# Patient Record
Sex: Male | Born: 1940 | Race: White | Marital: Married | Smoking: Former smoker
Health system: Southern US, Community
[De-identification: ages and names within clinical notes are randomized; demographics above are authoritative.]

## PROBLEM LIST (undated history)

## (undated) DIAGNOSIS — E785 Hyperlipidemia, unspecified: Secondary | ICD-10-CM

## (undated) DIAGNOSIS — I6529 Occlusion and stenosis of unspecified carotid artery: Secondary | ICD-10-CM

## (undated) DIAGNOSIS — R011 Cardiac murmur, unspecified: Secondary | ICD-10-CM

## (undated) DIAGNOSIS — R002 Palpitations: Secondary | ICD-10-CM

## (undated) DIAGNOSIS — E291 Testicular hypofunction: Secondary | ICD-10-CM

## (undated) DIAGNOSIS — E7800 Pure hypercholesterolemia, unspecified: Secondary | ICD-10-CM

## (undated) DIAGNOSIS — I700 Atherosclerosis of aorta: Secondary | ICD-10-CM

## (undated) DIAGNOSIS — R001 Bradycardia, unspecified: Secondary | ICD-10-CM

## (undated) DIAGNOSIS — I493 Ventricular premature depolarization: Secondary | ICD-10-CM

## (undated) DIAGNOSIS — K642 Third degree hemorrhoids: Secondary | ICD-10-CM

## (undated) DIAGNOSIS — K5792 Diverticulitis of intestine, part unspecified, without perforation or abscess without bleeding: Secondary | ICD-10-CM

## (undated) DIAGNOSIS — I341 Nonrheumatic mitral (valve) prolapse: Secondary | ICD-10-CM

## (undated) DIAGNOSIS — Z9289 Personal history of other medical treatment: Secondary | ICD-10-CM

## (undated) DIAGNOSIS — H269 Unspecified cataract: Secondary | ICD-10-CM

## (undated) DIAGNOSIS — E559 Vitamin D deficiency, unspecified: Secondary | ICD-10-CM

## (undated) DIAGNOSIS — E039 Hypothyroidism, unspecified: Secondary | ICD-10-CM

## (undated) DIAGNOSIS — I10 Essential (primary) hypertension: Secondary | ICD-10-CM

## (undated) DIAGNOSIS — N281 Cyst of kidney, acquired: Secondary | ICD-10-CM

## (undated) HISTORY — DX: Essential (primary) hypertension: I10

## (undated) HISTORY — DX: Pure hypercholesterolemia, unspecified: E78.00

## (undated) HISTORY — DX: Hyperlipidemia, unspecified: E78.5

## (undated) HISTORY — DX: Third degree hemorrhoids: K64.2

## (undated) HISTORY — DX: Hypothyroidism, unspecified: E03.9

## (undated) HISTORY — DX: Unspecified cataract: H26.9

## (undated) HISTORY — DX: Cyst of kidney, acquired: N28.1

## (undated) HISTORY — DX: Bradycardia, unspecified: R00.1

## (undated) HISTORY — DX: Ventricular premature depolarization: I49.3

## (undated) HISTORY — DX: Diverticulitis of intestine, part unspecified, without perforation or abscess without bleeding: K57.92

## (undated) HISTORY — DX: Nonrheumatic mitral (valve) prolapse: I34.1

## (undated) HISTORY — DX: Testicular hypofunction: E29.1

## (undated) HISTORY — DX: Cardiac murmur, unspecified: R01.1

## (undated) HISTORY — DX: Occlusion and stenosis of unspecified carotid artery: I65.29

## (undated) HISTORY — PX: COLONOSCOPY: SHX174

## (undated) HISTORY — DX: Atherosclerosis of aorta: I70.0

## (undated) HISTORY — DX: Palpitations: R00.2

## (undated) HISTORY — DX: Personal history of other medical treatment: Z92.89

## (undated) HISTORY — DX: Vitamin D deficiency, unspecified: E55.9

---

## 2012-11-27 ENCOUNTER — Telehealth (HOSPITAL_COMMUNITY): Payer: Self-pay | Admitting: Cardiovascular Disease

## 2012-12-05 ENCOUNTER — Other Ambulatory Visit (HOSPITAL_COMMUNITY): Payer: Self-pay | Admitting: Cardiovascular Disease

## 2012-12-05 DIAGNOSIS — I340 Nonrheumatic mitral (valve) insufficiency: Secondary | ICD-10-CM

## 2012-12-11 ENCOUNTER — Ambulatory Visit (HOSPITAL_COMMUNITY): Payer: 59

## 2012-12-12 ENCOUNTER — Ambulatory Visit (HOSPITAL_COMMUNITY)
Admission: RE | Admit: 2012-12-12 | Discharge: 2012-12-12 | Disposition: A | Discharge status: Home / Self Care | Payer: 59 | Source: Ambulatory Visit | Attending: Cardiovascular Disease | Admitting: Cardiovascular Disease

## 2012-12-12 DIAGNOSIS — I379 Nonrheumatic pulmonary valve disorder, unspecified: Secondary | ICD-10-CM | POA: Insufficient documentation

## 2012-12-12 DIAGNOSIS — I079 Rheumatic tricuspid valve disease, unspecified: Secondary | ICD-10-CM | POA: Insufficient documentation

## 2012-12-12 DIAGNOSIS — R002 Palpitations: Secondary | ICD-10-CM | POA: Insufficient documentation

## 2012-12-12 DIAGNOSIS — I340 Nonrheumatic mitral (valve) insufficiency: Secondary | ICD-10-CM

## 2012-12-12 DIAGNOSIS — I10 Essential (primary) hypertension: Secondary | ICD-10-CM | POA: Insufficient documentation

## 2012-12-12 DIAGNOSIS — I059 Rheumatic mitral valve disease, unspecified: Secondary | ICD-10-CM

## 2012-12-12 NOTE — Progress Notes (Signed)
[facility]   2D echo completed 12/12/2012.   Veda Canning, RDCS

## 2012-12-19 ENCOUNTER — Encounter: Payer: Self-pay | Admitting: *Deleted

## 2012-12-20 ENCOUNTER — Encounter: Payer: Self-pay | Admitting: Cardiovascular Disease

## 2012-12-23 ENCOUNTER — Ambulatory Visit (INDEPENDENT_AMBULATORY_CARE_PROVIDER_SITE_OTHER): Payer: 59 | Admitting: Cardiovascular Disease

## 2012-12-23 ENCOUNTER — Encounter: Payer: Self-pay | Admitting: Cardiovascular Disease

## 2012-12-23 VITALS — BP 130/80 | HR 58 | Ht 71.0 in | Wt 223.0 lb

## 2012-12-23 DIAGNOSIS — I059 Rheumatic mitral valve disease, unspecified: Secondary | ICD-10-CM

## 2012-12-23 DIAGNOSIS — I341 Nonrheumatic mitral (valve) prolapse: Secondary | ICD-10-CM | POA: Insufficient documentation

## 2012-12-23 DIAGNOSIS — I10 Essential (primary) hypertension: Secondary | ICD-10-CM

## 2012-12-23 DIAGNOSIS — E785 Hyperlipidemia, unspecified: Secondary | ICD-10-CM

## 2012-12-23 NOTE — Assessment & Plan Note (Signed)
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 He is asymptomatic. We'll continue to follow his 2-D echo on an annual basis.

## 2012-12-23 NOTE — Progress Notes (Signed)
12/23/2012 Chad Berry   31-May-1940  [id number]  Primary Physician Chad Real, MD Primary Cardiologist: Chad Gess MD Chad Berry   HPI:  The patient is a very pleasant, 72 year old, mildly overweight, married Caucasian male, father of 2, grandfather to 2 grandchildren whose son Chad Berry is also a patient of mine, as is his wife. I saw him 10 months ago. He was worked up for palpitations and had an echo that showed severe MR with mild mitral valve prolapse and mild LV size and function. His other problems include hypertension and hyperlipidemia. He lives an active lifestyle and is actually heading out to [place] to go coyote hunting for a week. He has changed his diet and has lost 15 pounds. He has cut out caffeine and alcohol. He has had 2 negative sleep studies. He is otherwise asymptomatic.  Since I saw him 6 months ago he denies chest pain or shortness of breath. Recent 2-D echo revealed normal LV size and function with posterior mitral valve leaflet prolapse and moderate MR. His primary care physician pauses profile.     Current Outpatient Prescriptions  Medication Sig Dispense Refill  . Ergocalciferol (VITAMIN D2 PO) once a week.      . losartan (COZAAR) 25 MG tablet Take 50 mg by mouth 2 (two) times daily.       . Magnesium 250 MG TABS Take 250 mg by mouth 2 (two) times daily.      . Pitavastatin Calcium (LIVALO) 2 MG TABS Take 1 tablet by mouth daily.      [name] testosterone cypionate (DEPOTESTOTERONE CYPIONATE) 200 MG/ML injection Inject 200 mg into the muscle every 28 (twenty-eight) days.        No current facility-administered medications for this visit.    No Known Allergies  History   Social History  . Marital Status: Married    Spouse Name: N/A    Number of Children: N/A  . Years of Education: N/A   Occupational History  . Not on file.   Social History Main Topics  . Smoking status: Former Smoker    Quit date: 12/23/1972  . Smokeless tobacco: Not on  file  . Alcohol Use: Not on file  . Drug Use: Not on file  . Sexual Activity: Not on file   Other Topics Concern  . Not on file   Social History Narrative  . No narrative on file     Review of Systems: General: negative for chills, fever, night sweats or weight changes.  Cardiovascular: negative for chest pain, dyspnea on exertion, edema, orthopnea, palpitations, paroxysmal nocturnal dyspnea or shortness of breath Dermatological: negative for rash Respiratory: negative for cough or wheezing Urologic: negative for hematuria Abdominal: negative for nausea, vomiting, diarrhea, bright red blood per rectum, melena, or hematemesis Neurologic: negative for visual changes, syncope, or dizziness All other systems reviewed and are otherwise negative except as noted above.    Blood pressure 130/80, pulse 58, height 5\' 11"  (1.803 m), weight 223 lb (101.152 kg).  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 He is asymptomatic. We'll continue to follow his 2-D echo on an annual basis.  Hyperlipidemia On statin therapy followed by his PCP  Essential hypertension Controlled on current medications      Chad Gess MD [facility], [facility] 12/23/2012 10:32 AM

## 2012-12-23 NOTE — Assessment & Plan Note (Signed)
On statin therapy followed by his PCP 

## 2012-12-23 NOTE — Assessment & Plan Note (Signed)
Controlled on current medications 

## 2012-12-23 NOTE — Patient Instructions (Signed)
  We will see you back in follow up in 6 months with an extender and 1 year with Dr Allyson Sabal   Dr Allyson Sabal has ordered an echocardiogram in 1 year

## 2013-09-02 ENCOUNTER — Telehealth: Payer: Self-pay | Admitting: Cardiovascular Disease

## 2013-09-02 NOTE — Telephone Encounter (Signed)
Closed encounter °

## 2013-09-11 ENCOUNTER — Telehealth: Payer: Self-pay | Admitting: Cardiology

## 2013-09-17 ENCOUNTER — Ambulatory Visit: Payer: 59 | Admitting: Cardiology

## 2013-09-18 NOTE — Telephone Encounter (Signed)
Closed encounter °

## 2013-09-25 ENCOUNTER — Ambulatory Visit (INDEPENDENT_AMBULATORY_CARE_PROVIDER_SITE_OTHER): Payer: 59 | Admitting: Cardiology

## 2013-09-25 ENCOUNTER — Encounter: Payer: Self-pay | Admitting: Cardiology

## 2013-09-25 VITALS — BP 140/70 | HR 61 | Ht 71.0 in | Wt 222.5 lb

## 2013-09-25 DIAGNOSIS — I10 Essential (primary) hypertension: Secondary | ICD-10-CM

## 2013-09-25 DIAGNOSIS — I341 Nonrheumatic mitral (valve) prolapse: Secondary | ICD-10-CM

## 2013-09-25 DIAGNOSIS — E785 Hyperlipidemia, unspecified: Secondary | ICD-10-CM

## 2013-09-25 DIAGNOSIS — I059 Rheumatic mitral valve disease, unspecified: Secondary | ICD-10-CM

## 2013-09-25 DIAGNOSIS — I6529 Occlusion and stenosis of unspecified carotid artery: Secondary | ICD-10-CM | POA: Insufficient documentation

## 2013-09-25 DIAGNOSIS — R002 Palpitations: Secondary | ICD-10-CM

## 2013-09-25 NOTE — Progress Notes (Addendum)
09/25/2013   PCP: Almedia BallsKELLY,SAM, MD   Chief Complaint  Patient presents with  . Follow-up    9 months. chest pain on occasion, denies dyspnea and swelling. had labs drawn at PCP earlier this month. has had trouble getting BP under controll, PCP d/c'd all meds to "start over" and he monitors BP each morning    Primary Cardiologist:Dr. Erlene QuanJ. Berry   HPI:  73 year old, mildly overweight, married Caucasian male, father of 2, grandfather to 2 grandchildren whose son Casimiro NeedleMichael is also a patient of Dr. Erlene QuanJ. Berry , as is his wife.  He was worked up for palpitations and had an echo that showed severe MR with mild mitral valve prolapse and mild LV size and function. His other problems include hypertension and hyperlipidemia. He lives an active lifestyle and is actually heading out to [place] to go coyote hunting for a week. He has changed his diet and has lost 15 pounds. He has cut out caffeine and alcohol. He has had 2 negative sleep studies. He is otherwise asymptomatic.    Today he is here for follow up.  He has new PCP and due to pt feeling cloudy in his head, "kind of like a hangover" his meds were adjusted.  BB caused bradycardia in the 40s.  Now he is feeling better overall on current meds.  No chest pain, no SOB.  No Known Allergies  Current Outpatient Prescriptions  Medication Sig Dispense Refill  . Ergocalciferol (VITAMIN D2 PO) once a week.      . hydrochlorothiazide (HYDRODIURIL) 25 MG tablet Take 12.5 mg by mouth daily.      [name]. losartan (COZAAR) 25 MG tablet Take 25 mg by mouth daily.       . Magnesium 250 MG TABS Take 250 mg by mouth 2 (two) times daily.      . Pitavastatin Calcium (LIVALO) 2 MG TABS Take 1 tablet by mouth daily.      [name]. testosterone (ANDROGEL) 50 MG/5GM (1%) GEL Place 5 g onto the skin daily.       No current facility-administered medications for this visit.    Past Medical History  Diagnosis Date  . Carotid stenosis     CAROTID DOPPLER, 12/07/2011 - Mild  arthrosclerotic changes, no high-grade stenosis  . MVP (mitral valve prolapse)     2D ECHO, 12/07/2011 - EF-65-70%, severe mitral regurgitation, moderate-severe tricuspid regurgitation, moderate pulmonary HTN, moderate pulmonic regurgitation  . Hypertension   . Hyperlipidemia   . Bradycardia     while on BB with HR to the 40s  . Rapid palpitations     event monitor with short bursts of SVT    History reviewed. No pertinent past surgical history.  WUJ:WJXBJYN:WGROS:General:no colds or fevers, no weight changes, was feeling cloudy for years until recent BP med adjustment. Skin:no rashes or ulcers HEENT:no blurred vision, no congestion CV:see HPI PUL:see HPI GI:no diarrhea constipation or melena, no indigestion GU:no hematuria, no dysuria MS:no joint pain, no claudication Neuro:no syncope, no lightheadedness Endo:no diabetes, no thyroid disease  PHYSICAL EXAM BP 140/70  Pulse 61  Ht 5\' 11"  (1.803 m)  Wt 222 lb 8 oz (100.925 kg)  BMI 31.05 kg/m2 General:Pleasant affect, NAD Skin:Warm and dry, brisk capillary refill HEENT:normocephalic, sclera clear, mucus membranes moist Neck:supple, no JVD, soft bil bruits  Heart:S1S2 RRR with 2-3/6 MR murmur, no  gallup, rub or click Lungs:clear without rales, rhonchi, or wheezes NFA:OZHYAbd:soft, non tender, + BS, do not  palpate liver spleen or masses Ext:no lower ext edema, 2+ pedal pulses, 2+ radial pulses Neuro:alert and oriented, MAE, follows commands, + facial symmetry  EKG:SR no acute changes from previous EKG  HR 61  ASSESSMENT AND PLAN Mitral valve prolapse Stable with last echo 11/2012 with  Moderate reg.  To repeat 11/2013 then follow up with Dr. Allyson Sabal  Hyperlipidemia Followed by PCP  Essential hypertension Controlled with recent med changes by PCP, BB caused HR to be in the 40s.  He had felt bad, cloudy mentally and with decrease in his antihypertensives he does feel better.    Rapid palpitations Still with occ burst of palpitations.

## 2013-09-25 NOTE — Assessment & Plan Note (Signed)
Stable with last echo 11/2012 with  Moderate reg.  To repeat 11/2013 then follow up with Dr. Allyson Sabal

## 2013-09-25 NOTE — Assessment & Plan Note (Signed)
Controlled with recent med changes by PCP, BB caused HR to be in the 40s.  He had felt bad, cloudy mentally and with decrease in his antihypertensives he does feel better.

## 2013-09-25 NOTE — Assessment & Plan Note (Signed)
Still with occ burst of palpitations.

## 2013-09-25 NOTE — Assessment & Plan Note (Signed)
Followed by PCP

## 2013-09-25 NOTE — Patient Instructions (Signed)
Have echo done before next visit with Dr. Allyson Sabal  In 6 months  Call if any problems.

## 2013-11-17 ENCOUNTER — Encounter (INDEPENDENT_AMBULATORY_CARE_PROVIDER_SITE_OTHER): Payer: Self-pay

## 2013-11-17 ENCOUNTER — Ambulatory Visit (INDEPENDENT_AMBULATORY_CARE_PROVIDER_SITE_OTHER): Payer: 59 | Admitting: Family Medicine

## 2013-11-17 VITALS — BP 126/65 | HR 56 | Temp 98.4°F | Ht 71.0 in | Wt 227.0 lb

## 2013-11-17 DIAGNOSIS — M545 Low back pain, unspecified: Secondary | ICD-10-CM

## 2013-11-17 MED ORDER — CYCLOBENZAPRINE HCL 10 MG PO TABS: 10.0000 mg | ORAL_TABLET | Freq: Three times a day (TID) | ORAL | Status: DC | PRN

## 2013-11-17 MED ORDER — NAPROXEN 500 MG PO TABS: 500.0000 mg | ORAL_TABLET | Freq: Two times a day (BID) | ORAL | Status: DC

## 2013-11-17 NOTE — Progress Notes (Signed)
   Subjective:    Patient ID: Chad SalisburyJohn Berry, male    DOB: [birth year], 73 y.o.   MRN: [id number]  HPI This 73 y.o. male presents for evaluation of back discomfort.  He does not remember injuring his back but is having some left lumbar pain and notices it when he stands up.   Review of Systems C/o back pain No chest pain, SOB, HA, dizziness, vision change, N/V, diarrhea, constipation, dysuria, urinary urgency or frequency or rash.     Objective:   Physical Exam  Vital signs noted  Well developed well nourished male.  HEENT - Head atraumatic Normocephalic                Eyes - PERRLA, Conjuctiva - clear Sclera- Clear EOMI                Ears - EAC's Wnl TM's Wnl Gross Hearing WNL                Throat - oropharanx wnl Respiratory - Lungs CTA bilateral Cardiac - RRR S1 and S2 without murmur GI - Abdomen soft Nontender and bowel sounds active x 4 MS - TTP left lumbar paraspinous muscles, decreased ROM LS spine    Assessment & Plan:  Left-sided low back pain without sciatica - Plan: naproxen (NAPROSYN) 500 MG tablet, cyclobenzaprine (FLEXERIL) 10 MG tablet Heating pad prn  Deatra CanterWilliam J Tehani Mersman FNP

## 2013-12-23 ENCOUNTER — Ambulatory Visit (HOSPITAL_COMMUNITY)
Admission: RE | Admit: 2013-12-23 | Discharge: 2013-12-23 | Disposition: A | Discharge status: Home / Self Care | Payer: 59 | Source: Ambulatory Visit | Attending: Cardiology | Admitting: Cardiology

## 2013-12-23 DIAGNOSIS — I341 Nonrheumatic mitral (valve) prolapse: Secondary | ICD-10-CM

## 2013-12-23 DIAGNOSIS — I059 Rheumatic mitral valve disease, unspecified: Secondary | ICD-10-CM | POA: Insufficient documentation

## 2013-12-23 NOTE — Progress Notes (Signed)
2D Echo Performed 12/23/2013    Mariea Mcmartin, RCS  

## 2013-12-29 ENCOUNTER — Telehealth: Payer: Self-pay | Admitting: *Deleted

## 2013-12-29 DIAGNOSIS — I341 Nonrheumatic mitral (valve) prolapse: Secondary | ICD-10-CM

## 2013-12-29 NOTE — Telephone Encounter (Signed)
Pt notified of echo results

## 2013-12-29 NOTE — Telephone Encounter (Signed)
Order placed for repeat echo in 1 year 

## 2013-12-29 NOTE — Telephone Encounter (Signed)
Message copied by Marella Bile on Mon Dec 29, 2013  4:42 PM ------      Message from: Runell Gess      Created: Fri Dec 26, 2013  5:24 PM       Nl LV fxn. Mild MVP and moderate MR. Will need to repeat annually ------

## 2013-12-29 NOTE — Telephone Encounter (Signed)
Message copied by Marella Bile on Mon Dec 29, 2013  3:40 PM ------      Message from: Runell Gess      Created: Fri Dec 26, 2013  5:24 PM       Nl LV fxn. Mild MVP and moderate MR. Will need to repeat annually ------

## 2014-03-05 DIAGNOSIS — M47817 Spondylosis without myelopathy or radiculopathy, lumbosacral region: Secondary | ICD-10-CM | POA: Insufficient documentation

## 2014-03-05 DIAGNOSIS — E230 Hypopituitarism: Secondary | ICD-10-CM | POA: Insufficient documentation

## 2014-08-03 DIAGNOSIS — M79672 Pain in left foot: Secondary | ICD-10-CM | POA: Insufficient documentation

## 2014-08-03 DIAGNOSIS — M79671 Pain in right foot: Secondary | ICD-10-CM | POA: Insufficient documentation

## 2015-01-11 ENCOUNTER — Telehealth: Payer: Self-pay | Admitting: Cardiovascular Disease

## 2015-01-11 NOTE — Progress Notes (Signed)
Cardiology Office Note   Date:  01/12/2015   ID:  Chad Berry, DOB January 24, 1941, MRN [id number]  PCP:  Almedia Balls, MD  Cardiologist:  Dr. Nanetta Batty   Electrophysiologist:  n/a  Chief Complaint  Patient presents with  . Palpitations     History of Present Illness: Chad Berry is a 74 y.o. male with a hx of MVP with mod MR, palpitations, HTN, HL.  He was last seen in clinic by Nada Boozer, NP in 5/15. He was taken off of beta-blocker at that time due to low HR and notable SEs.  He had a FU echo in 8/15 with normal LVF, mod MR and mild MVP involving the posterior leaflet.  He called in recently with palpitations.  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  He felt quite overheated afterward and tried to drink plenty of fluids.  He notes a long hx of palpitations. However, that evening he had increased frequency and increased intensity of his palpitations. He describes them as a missed heart beat followed by a hard heart beat.  The skipped beat is painful.  He denies syncope, near syncope or rapid palpitations.  He remains quite active.  He denies any exertional chest pain or significant DOE.  He denies orthopnea, PND, edema.  He notes difficulty sleeping over the past 2 nights.  He does feel better today and slept well last night.  He avoids caffeine and stimulants.  He has a long hx of fatigue.  He describes it as "just not feeling good."  This has gone on for years without change.  He tells me that his PCP has already done extensive workup.  Of note, he started HCTZ for his HTN ~ 6 mos ago.  He apparently had higher K+ levels with Cozaar 50 mg and he has remained on 25 mg QD since.     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 Carotid stenosis     CAROTID DOPPLER, 12/07/2011 - Mild arthrosclerotic changes, no high-grade stenosis  . MVP (mitral valve prolapse)     a. Echo 12/07/2011 - EF-65-70%, severe mitral regurgitation, moderate-severe tricuspid regurgitation, moderate pulmonary HTN, moderate pulmonic regurgitation;  b. 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 Hypertension   . Hyperlipidemia   . Bradycardia     while on BB with HR to the 40s  . Rapid palpitations     event monitor with short bursts of SVT    No past surgical history on file.   Current Outpatient Prescriptions  Medication Sig Dispense Refill  . losartan (COZAAR) 25 MG tablet Take 25 mg by mouth daily.     . Magnesium 250 MG TABS Take 250 mg by mouth 2 (two) times daily.    . Pitavastatin Calcium (LIVALO) 2 MG TABS Take 1 tablet by mouth daily.     No current facility-administered medications  for this visit.    Allergies:   Review of patient's allergies indicates no known allergies.    Social History:  The patient  reports that he quit smoking about 42 years ago. He does not have any smokeless tobacco history on file. He reports that he drinks alcohol. He reports that he does not use illicit drugs.   Family History:  The patient's family history includes Stroke (age of onset: 50) in his brother; Stroke (age of onset: 50) in his mother.    ROS:   Please see the history of present illness.   Review of Systems  Cardiovascular: Positive for chest pain and irregular heartbeat.  All other systems reviewed and are negative.     PHYSICAL EXAM: VS:  BP 126/74 mmHg  Pulse 56  Ht 5\' 11"  (1.803  m)  Wt 230 lb (104.327 kg)  BMI 32.09 kg/m2    Wt Readings from Last 3 Encounters:  01/12/15 230 lb (104.327 kg)  11/17/13 227 lb (102.967 kg)  09/25/13 222 lb 8 oz (100.925 kg)     GEN: Well nourished, well developed, in no acute distress HEENT: normal Neck: no JVD, no carotid bruits, no masses Cardiac:  Normal S1/S2, RRR; 2/6 holosystolic murmur  LLSB,  no rubs or gallops, no edema   Respiratory:  clear to auscultation bilaterally, no wheezing, rhonchi or rales. GI: soft, nontender, nondistended, + BS MS: no deformity or atrophy Skin: warm and dry  Neuro:  CNs II-XII intact, Strength and sensation are intact Psych: Normal affect   EKG:  EKG is ordered today.  It demonstrsinus bradycardia, HR 56, normal axis, nonspecific ST-T wave changes, QTc 416 ms    Recent Labs: No results found for requested labs within last 365 days.    Lipid Panel No results found for: CHOL, TRIG, HDL, CHOLHDL, VLDL, LDLCALC, LDLDIRECT    ASSESSMENT AND PLAN:  Palpitations:  I suspect he had an increase in PVCs/PACs secondary to dehydration in the setting of diuretic use.  He works outside most days for several hours.  He has had a lot of intolerances to BP medications in the past.  As he does work outside most of the time, I think it would be better for him to remain off of diuretic Rx to avoid dehydration.  -  DC HCTZ  -  BMET, Mg2+ today  - 48 Hr Holter to further assess palpitations  HTN:  BP well controlled.  I have asked him to monitor his BP off of HCTZ.  If BP remains < 140/90, will not make further changes.  If it starts to run 140/90 or higher, consider adding Amlodipine 2.5 mg QD.    MVP with Mod Mitral Regurgitation:  Will arrange FU echo.  He is currently not having any symptoms to suggest severe MR.  Hyperlipidemia:  Managed by PCP. Continue statin.    Medication Changes: Current medicines are reviewed at length with the patient today.  Concerns regarding medicines are as  outlined above.  The following changes have been made:   Discontinued Medications   CYCLOBENZAPRINE (FLEXERIL) 10 MG TABLET    Take 1 tablet (10 mg total) by mouth 3 (three) times daily as needed for muscle spasms.   ERGOCALCIFEROL (VITAMIN D2 PO)    once a week.   HYDROCHLOROTHIAZIDE (HYDRODIURIL) 25 MG TABLET    Take 12.5 mg by mouth daily.   NAPROXEN (NAPROSYN) 500 MG TABLET    Take 1 tablet (500 mg total) by mouth 2 (two)  times daily with a meal.   TESTOSTERONE (ANDROGEL) 50 MG/5GM (1%) GEL    Place 5 g onto the skin daily.   Modified Medications   No medications on file   New Prescriptions   No medications on file    Labs/ tests ordered today include:   Orders Placed This Encounter  Procedures  . Basic metabolic panel  . Magnesium  . Holter monitor - 48 hour  . EKG 12-Lead  . Echocardiogram     Disposition:    FU with Dr. Nanetta Batty 6-8 weeks.    Signed, Brynda Rim, MHS 01/12/2015 1:05 PM    [facility] [address] [place], [place], [place]  [address] Phone: [phone]; Fax: [phone]

## 2015-01-11 NOTE — Telephone Encounter (Signed)
Patient says he has had palpitations for years but this weekend they have been worse.  Today he got out of bed and he felt dizzy for the first time ever  Patient now eating breakfast, still feels light headed.  Patient will call me back with blood pressure and heart rate.  Told patient he has an OV at 1130am with Tereso Newcomer on [address]

## 2015-01-11 NOTE — Telephone Encounter (Signed)
BP today 149/79 P 68

## 2015-01-11 NOTE — Telephone Encounter (Signed)
Pt called in stating that over the weekend he has been having bad palpitations at night and when he woke up this morning he was very dizzy. He would like to come in to see someone as soon as possible. Please call  Thanks

## 2015-01-12 ENCOUNTER — Encounter: Payer: Self-pay | Admitting: Physician Assistant

## 2015-01-12 ENCOUNTER — Ambulatory Visit (INDEPENDENT_AMBULATORY_CARE_PROVIDER_SITE_OTHER): Payer: 59 | Admitting: Physician Assistant

## 2015-01-12 VITALS — BP 126/74 | HR 56 | Ht 71.0 in | Wt 230.0 lb

## 2015-01-12 DIAGNOSIS — I340 Nonrheumatic mitral (valve) insufficiency: Secondary | ICD-10-CM

## 2015-01-12 DIAGNOSIS — I10 Essential (primary) hypertension: Secondary | ICD-10-CM

## 2015-01-12 DIAGNOSIS — R002 Palpitations: Secondary | ICD-10-CM | POA: Diagnosis not present

## 2015-01-12 DIAGNOSIS — I341 Nonrheumatic mitral (valve) prolapse: Secondary | ICD-10-CM | POA: Diagnosis not present

## 2015-01-12 DIAGNOSIS — E785 Hyperlipidemia, unspecified: Secondary | ICD-10-CM

## 2015-01-12 LAB — BASIC METABOLIC PANEL
BUN: 16 mg/dL (ref 6–23)
CHLORIDE: 101 meq/L (ref 96–112)
CO2: 30 mEq/L (ref 19–32)
CREATININE: 1 mg/dL (ref 0.40–1.50)
Calcium: 9.2 mg/dL (ref 8.4–10.5)
GFR: 78.05 mL/min (ref >60.00)
Glucose, Bld: 95 mg/dL (ref 70–99)
POTASSIUM: 4.4 meq/L (ref 3.5–5.1)
Sodium: 137 mEq/L (ref 135–145)

## 2015-01-12 LAB — MAGNESIUM: Magnesium: 2.1 mg/dL (ref 1.5–2.5)

## 2015-01-12 NOTE — Patient Instructions (Signed)
Medication Instructions:  STOP HCTZ  Labwork: Bmet and Mag today  Testing/Procedures: Your physician has recommended that you wear a holter monitor. Holter monitors are medical devices that record the heart's electrical activity. Doctors most often use these monitors to diagnose arrhythmias. Arrhythmias are problems with the speed or rhythm of the heartbeat. The monitor is a small, portable device. You can wear one while you do your normal daily activities. This is usually used to diagnose what is causing palpitations/syncope (passing out).  Your physician has requested that you have an echocardiogram. Echocardiography is a painless test that uses sound waves to create images of your heart. It provides your doctor with information about the size and shape of your heart and how well your heart's chambers and valves are working. This procedure takes approximately one hour. There are no restrictions for this procedure.    Follow-Up: Your physician recommends that you schedule a follow-up appointment in: 6-8 weeks with Dr.Berry   Any Other Special Instructions Will Be Listed Below (If Applicable). Measure your blood pressure daily. Call the office if your BP is consistently 140/90 or greater

## 2015-01-14 ENCOUNTER — Telehealth: Payer: Self-pay | Admitting: *Deleted

## 2015-01-14 NOTE — Telephone Encounter (Signed)
Pt notified of lab reults by phone with verbal understanding.

## 2015-01-20 ENCOUNTER — Ambulatory Visit (INDEPENDENT_AMBULATORY_CARE_PROVIDER_SITE_OTHER): Payer: 59

## 2015-01-20 ENCOUNTER — Ambulatory Visit (HOSPITAL_COMMUNITY): Payer: 59 | Attending: Physician Assistant

## 2015-01-20 ENCOUNTER — Other Ambulatory Visit: Payer: Self-pay

## 2015-01-20 DIAGNOSIS — Z87891 Personal history of nicotine dependence: Secondary | ICD-10-CM | POA: Insufficient documentation

## 2015-01-20 DIAGNOSIS — E785 Hyperlipidemia, unspecified: Secondary | ICD-10-CM | POA: Diagnosis not present

## 2015-01-20 DIAGNOSIS — I341 Nonrheumatic mitral (valve) prolapse: Secondary | ICD-10-CM | POA: Insufficient documentation

## 2015-01-20 DIAGNOSIS — I10 Essential (primary) hypertension: Secondary | ICD-10-CM | POA: Insufficient documentation

## 2015-01-20 DIAGNOSIS — I071 Rheumatic tricuspid insufficiency: Secondary | ICD-10-CM | POA: Diagnosis not present

## 2015-01-20 DIAGNOSIS — R002 Palpitations: Secondary | ICD-10-CM | POA: Diagnosis not present

## 2015-01-20 DIAGNOSIS — I340 Nonrheumatic mitral (valve) insufficiency: Secondary | ICD-10-CM | POA: Diagnosis not present

## 2015-01-21 ENCOUNTER — Encounter: Payer: Self-pay | Admitting: Physician Assistant

## 2015-01-22 ENCOUNTER — Telehealth: Payer: Self-pay | Admitting: *Deleted

## 2015-01-22 NOTE — Telephone Encounter (Signed)
Lmptcb to go over echo results 

## 2015-01-25 NOTE — Telephone Encounter (Signed)
Lmptcb x 2 

## 2015-01-26 NOTE — Telephone Encounter (Signed)
Ptcb and has been notified of echo results and to keep his appt with Dr. Allyson Sabal 03/2015. Pt agreeable to plan of care.

## 2015-02-12 ENCOUNTER — Telehealth: Payer: Self-pay | Admitting: General Practice

## 2015-02-15 ENCOUNTER — Telehealth: Payer: Self-pay | Admitting: Cardiovascular Disease

## 2015-02-15 NOTE — Telephone Encounter (Signed)
Spoke to patient, notified of Holter monitor results and that these will be discussed further at follow up. Advised to call in interim if concerns. Pt voiced understanding.

## 2015-02-15 NOTE — Telephone Encounter (Signed)
Pt is returning Kimberly's call from last Thursday. He is not sure what it is in regards to . Please f/u with him  Thanks

## 2015-03-02 ENCOUNTER — Ambulatory Visit (INDEPENDENT_AMBULATORY_CARE_PROVIDER_SITE_OTHER): Payer: 59 | Admitting: Cardiovascular Disease

## 2015-03-02 ENCOUNTER — Encounter: Payer: Self-pay | Admitting: Cardiovascular Disease

## 2015-03-02 DIAGNOSIS — R0989 Other specified symptoms and signs involving the circulatory and respiratory systems: Secondary | ICD-10-CM

## 2015-03-02 DIAGNOSIS — R002 Palpitations: Secondary | ICD-10-CM

## 2015-03-02 DIAGNOSIS — I341 Nonrheumatic mitral (valve) prolapse: Secondary | ICD-10-CM | POA: Diagnosis not present

## 2015-03-02 DIAGNOSIS — E785 Hyperlipidemia, unspecified: Secondary | ICD-10-CM

## 2015-03-02 DIAGNOSIS — I10 Essential (primary) hypertension: Secondary | ICD-10-CM

## 2015-03-02 MED ORDER — LOSARTAN POTASSIUM 25 MG PO TABS: 25.0000 mg | ORAL_TABLET | Freq: Every day | ORAL | Status: DC

## 2015-03-02 MED ORDER — LOSARTAN POTASSIUM 50 MG PO TABS: 50.0000 mg | ORAL_TABLET | Freq: Every day | ORAL | Status: DC

## 2015-03-02 NOTE — Assessment & Plan Note (Signed)
History of hypertension blood pressure measures at 142/74. This is higher than previous readings and his hydrochlorothiazide has been discontinued. He is on losartan 25 mg which I'm going to increase to 50 mg a day.

## 2015-03-02 NOTE — Progress Notes (Signed)
03/02/2015 Chad Berry   03-Oct-1940  [id number]  Primary Physician Chad Balls, MD Primary Cardiologist: Chad Gess MD Chad Berry   HPI:  The patient is a very pleasant, 74 year old, mildly overweight, married Caucasian male, father of 2, grandfather to 2 grandchildren whose son Chad Berry is also a patient of mine, as is his wife. I saw him last 12/23/12. He was worked up for palpitations and had an echo that showed severe MR with mild mitral valve prolapse and mild LV size and function. His other problems include hypertension and hyperlipidemia. He lives an active lifestyle and is actually heading out to [place] to go coyote hunting for a week. He has changed his diet and has lost 15 pounds. He has cut out caffeine and alcohol. He has had 2 negative sleep studies. He is otherwise asymptomatic. Since I saw him several years ago he has seen Chad Berry in the office for evaluation of palpitations. A 2-D echo revealed normal LV function with holosystolic mitral valve prolapse, mild MR and moderate TR. A 48 hour Holter monitor showed PVCs, short runs of nonsustained ventricular tachycardia and short runs of SVT. Since stopping his diuretic his palpitations have improved.    Current Outpatient Prescriptions  Medication Sig Dispense Refill  . Magnesium 250 MG TABS Take 250 mg by mouth 2 (two) times daily.    . Pitavastatin Calcium (LIVALO) 2 MG TABS Take 1 tablet by mouth daily.     No current facility-administered medications for this visit.    No Known Allergies  Social History   Social History  . Marital Status: Married    Spouse Name: N/A  . Number of Children: N/A  . Years of Education: N/A   Occupational History  . Not on file.   Social History Main Topics  . Smoking status: Former Smoker    Quit date: 12/23/1972  . Smokeless tobacco: Not on file  . Alcohol Use: Yes  . Drug Use: No  . Sexual Activity: Not on file   Other Topics Concern  . Not on file    Social History Narrative     Review of Systems: General: negative for chills, fever, night sweats or weight changes.  Cardiovascular: negative for chest pain, dyspnea on exertion, edema, orthopnea, palpitations, paroxysmal nocturnal dyspnea or shortness of breath Dermatological: negative for rash Respiratory: negative for cough or wheezing Urologic: negative for hematuria Abdominal: negative for nausea, vomiting, diarrhea, bright red blood per rectum, melena, or hematemesis Neurologic: negative for visual changes, syncope, or dizziness All other systems reviewed and are otherwise negative except as noted above.    Blood pressure 142/74, pulse 64, height  (1.803 m), weight 230 lb (104.327 kg).  General appearance: alert and no distress Neck: no adenopathy, no JVD, supple, symmetrical, trachea midline, thyroid not enlarged, symmetric, no tenderness/mass/nodules and bilateral carotid bruits versus transmitted murmur Lungs: clear to auscultation bilaterally Heart: 2/6 outflow tract murmur as well as apical systolic murmur Extremities: extremities normal, atraumatic, no cyanosis or edema  EKG not performed today  ASSESSMENT AND PLAN:   Rapid palpitations History of palpitations which have improved since discontinuing his diuretic. He sent to the echo showed holosystolic mitral valve prolapse with mild MR and moderate TR. 848 hour Holter monitor showed PVCs with short bursts of nonsustained ventricular tachycardia and short bursts of SVT.  Mitral valve prolapse Recent 2-D echo performed 01/11/15 showed holosystolic mitral valve prolapse with mild MR.  Hyperlipidemia History of hyperlipidemia on Livalo followed by  his PCP  Essential hypertension History of hypertension blood pressure measures at 142/74. This is higher than previous readings and his hydrochlorothiazide has been discontinued. He is on losartan 25 mg which I'm going to increase to 50 mg a day.  Carotid stenosis.  bil 39% Bilateral carotid bruits with carotid Dopplers performed 3 years ago that showed minimal carotid artery stenosis. I am going to repeat these      Chad GessJonathan J. Jorel Gravlin MD [facility] LLCFACP,FACC,FAHA, [facility] DallasFSCAI 03/02/2015 10:36 AM

## 2015-03-02 NOTE — Assessment & Plan Note (Signed)
Recent 2-D echo performed 01/11/15 showed holosystolic mitral valve prolapse with mild MR.

## 2015-03-02 NOTE — Assessment & Plan Note (Signed)
History of hyperlipidemia on Livalo followed by his PCP 

## 2015-03-02 NOTE — Assessment & Plan Note (Signed)
History of palpitations which have improved since discontinuing his diuretic. He sent to the echo showed holosystolic mitral valve prolapse with mild MR and moderate TR. 848 hour Holter monitor showed PVCs with short bursts of nonsustained ventricular tachycardia and short bursts of SVT.

## 2015-03-02 NOTE — Patient Instructions (Addendum)
Medication Instructions:  Your physician recommends that you continue on your current medications as directed. Please refer to the Current Medication list given to you today.   Labwork: I will get your lab work from your Primary Care Physician.   Testing/Procedures: Your physician has requested that you have a carotid duplex. This test is an ultrasound of the carotid arteries in your neck. It looks at blood flow through these arteries that supply the brain with blood. Allow one hour for this exam. There are no restrictions or special instructions.    Follow-Up: Your physician wants you to follow-up in: 12 months with DR. Allyson SabalBerry. You will receive a reminder letter in the mail two months in advance. If you don't receive a letter, please call our office to schedule the follow-up appointment.   Any Other Special Instructions Will Be Listed Below (If Applicable).     If you need a refill on your cardiac medications before your next appointment, please call your pharmacy.

## 2015-03-02 NOTE — Assessment & Plan Note (Signed)
Bilateral carotid bruits with carotid Dopplers performed 3 years ago that showed minimal carotid artery stenosis. I am going to repeat these

## 2015-03-16 ENCOUNTER — Ambulatory Visit (HOSPITAL_COMMUNITY)
Admission: RE | Admit: 2015-03-16 | Discharge: 2015-03-16 | Disposition: A | Discharge status: Home / Self Care | Payer: 59 | Source: Ambulatory Visit | Attending: Cardiovascular Disease | Admitting: Cardiovascular Disease

## 2015-03-16 DIAGNOSIS — I6523 Occlusion and stenosis of bilateral carotid arteries: Secondary | ICD-10-CM | POA: Diagnosis not present

## 2015-03-16 DIAGNOSIS — I10 Essential (primary) hypertension: Secondary | ICD-10-CM | POA: Diagnosis not present

## 2015-03-16 DIAGNOSIS — E785 Hyperlipidemia, unspecified: Secondary | ICD-10-CM | POA: Diagnosis not present

## 2015-03-16 DIAGNOSIS — R0989 Other specified symptoms and signs involving the circulatory and respiratory systems: Secondary | ICD-10-CM | POA: Insufficient documentation

## 2015-06-09 ENCOUNTER — Other Ambulatory Visit: Payer: Self-pay | Admitting: Physician Assistant

## 2015-06-09 ENCOUNTER — Encounter (HOSPITAL_COMMUNITY): Payer: Self-pay | Admitting: Emergency Medicine

## 2015-06-09 ENCOUNTER — Emergency Department (HOSPITAL_COMMUNITY): Payer: 59

## 2015-06-09 ENCOUNTER — Emergency Department (HOSPITAL_COMMUNITY)
Admission: EM | Admit: 2015-06-09 | Discharge: 2015-06-09 | Disposition: A | Discharge status: Home / Self Care | Payer: 59 | Attending: Emergency Medicine | Admitting: Emergency Medicine

## 2015-06-09 DIAGNOSIS — I10 Essential (primary) hypertension: Secondary | ICD-10-CM

## 2015-06-09 DIAGNOSIS — E785 Hyperlipidemia, unspecified: Secondary | ICD-10-CM | POA: Diagnosis present

## 2015-06-09 DIAGNOSIS — I340 Nonrheumatic mitral (valve) insufficiency: Secondary | ICD-10-CM | POA: Diagnosis not present

## 2015-06-09 DIAGNOSIS — R002 Palpitations: Secondary | ICD-10-CM | POA: Diagnosis not present

## 2015-06-09 DIAGNOSIS — I6529 Occlusion and stenosis of unspecified carotid artery: Secondary | ICD-10-CM | POA: Diagnosis present

## 2015-06-09 DIAGNOSIS — I498 Other specified cardiac arrhythmias: Secondary | ICD-10-CM

## 2015-06-09 DIAGNOSIS — I341 Nonrheumatic mitral (valve) prolapse: Secondary | ICD-10-CM | POA: Diagnosis not present

## 2015-06-09 DIAGNOSIS — R011 Cardiac murmur, unspecified: Secondary | ICD-10-CM | POA: Insufficient documentation

## 2015-06-09 DIAGNOSIS — Z79899 Other long term (current) drug therapy: Secondary | ICD-10-CM | POA: Diagnosis not present

## 2015-06-09 DIAGNOSIS — R001 Bradycardia, unspecified: Secondary | ICD-10-CM | POA: Insufficient documentation

## 2015-06-09 DIAGNOSIS — I442 Atrioventricular block, complete: Secondary | ICD-10-CM

## 2015-06-09 DIAGNOSIS — Z87891 Personal history of nicotine dependence: Secondary | ICD-10-CM | POA: Diagnosis not present

## 2015-06-09 DIAGNOSIS — I071 Rheumatic tricuspid insufficiency: Secondary | ICD-10-CM

## 2015-06-09 LAB — I-STAT TROPONIN, ED
Troponin i, poc: 0 ng/mL (ref 0.00–0.08)
Troponin i, poc: 0 ng/mL (ref 0.00–0.08)

## 2015-06-09 LAB — BASIC METABOLIC PANEL
Anion gap: 11 (ref 5–15)
BUN: 11 mg/dL (ref 6–20)
CHLORIDE: 105 mmol/L (ref 101–111)
CO2: 24 mmol/L (ref 22–32)
CREATININE: 0.94 mg/dL (ref 0.61–1.24)
Calcium: 9.2 mg/dL (ref 8.9–10.3)
GFR calc Af Amer: >60 mL/min (ref >60)
GFR calc non Af Amer: >60 mL/min (ref >60)
Glucose, Bld: 112 mg/dL — ABNORMAL HIGH (ref 65–99)
Potassium: 4.2 mmol/L (ref 3.5–5.1)
SODIUM: 140 mmol/L (ref 135–145)

## 2015-06-09 LAB — CBC
HEMATOCRIT: 44.8 % (ref 39.0–52.0)
Hemoglobin: 15.5 g/dL (ref 13.0–17.0)
MCH: 32 pg (ref 26.0–34.0)
MCHC: 34.6 g/dL (ref 30.0–36.0)
MCV: 92.4 fL (ref 78.0–100.0)
Platelets: 149 10*3/uL — ABNORMAL LOW (ref 150–400)
RBC: 4.85 MIL/uL (ref 4.22–5.81)
RDW: 12.7 % (ref 11.5–15.5)
WBC: 4.9 10*3/uL (ref 4.0–10.5)

## 2015-06-09 MED ORDER — LOSARTAN POTASSIUM 100 MG PO TABS
100.0000 mg | ORAL_TABLET | Freq: Every day | ORAL | Status: DC
Start: 2015-06-09 — End: 2020-09-29

## 2015-06-09 NOTE — ED Provider Notes (Signed)
CSN: [id number]     Arrival date & time 06/09/15  1125 History   First MD Initiated Contact with Patient 06/09/15 1133     Chief Complaint  Patient presents with  . Palpitations     (Consider location/radiation/quality/duration/timing/severity/associated sxs/prior Treatment) HPI   Blood pressure 157/97, pulse 59, temperature 97.6 F (36.4 C), temperature source Oral, resp. rate 17, SpO2 97 %.  Daylin Gruszka is a 75 y.o. male with past medical history significant for mild carotid stenosis, hypertension, hyperlipidemia, mitral valve prolapse complaining of palpitations which she's had for many years and has had extensive workup for. States this is not typical for a palpitations and that they last longer, approximately 5 seconds and are associated with pain, at the very end of the palpitation and pain he feels slightly lightheaded but there has been no syncope. This started 5 days ago. States that the pain woke him from sleep approximately 5 times last night. He denies increasing peripheral edema, cough, fever, chills, abdominal pain, nausea, vomiting, diaphoresis, calf pain, recent immobilizations.   Past Medical History  Diagnosis Date  . Carotid stenosis     CAROTID DOPPLER, 12/07/2011 - Mild arthrosclerotic changes, no high-grade stenosis  . MVP (mitral valve prolapse)     a. Echo 12/07/2011 - EF-65-70%, severe mitral regurgitation, moderate-severe tricuspid regurgitation, moderate pulmonary HTN, moderate pulmonic regurgitation;  b. 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 Hypertension   . Hyperlipidemia   . Bradycardia     while on BB with HR to the 40s  . Rapid palpitations     event monitor with short bursts of SVT  . History of echocardiogram     Echo 9/16:  EF 60-65%, no RWMA, Gr 1 DD, holosystolic MVP of  posterior leaflet, mild MR, LA upper limits of normal, normal RVF, mod TR, PASP 50 mmHg   History reviewed. No pertinent past surgical history. Family History  Problem Relation Age of Onset  . Stroke Mother 27  . Stroke Brother 79   Social History  Substance Use Topics  . Smoking status: Former Smoker    Quit date: 12/23/1972  . Smokeless tobacco: None  . Alcohol Use: Yes    Review of Systems  10 systems reviewed and found to be negative, except as noted in the HPI.  Allergies  Review of patient's allergies indicates no known allergies.  Home Medications   Prior to Admission medications   Medication Sig Start Date End Date Taking? Authorizing Provider  losartan (COZAAR) 50 MG tablet Take 50 mg by mouth daily.   Yes Historical Provider, MD  Magnesium 250 MG TABS Take 250 mg by mouth 2 (two) times daily.   Yes Historical Provider, MD  Pitavastatin Calcium (LIVALO) 2 MG TABS Take 1 tablet by mouth daily.   Yes Historical Provider, MD   BP 145/77 mmHg  Pulse 50  Temp(Src) 97.6 F (36.4 C) (Oral)  Resp 16  SpO2 97% Physical Exam  Constitutional: He is oriented to person, place, and time. He appears well-developed and well-nourished. No distress.  HENT:  Head: Normocephalic and atraumatic.  Mouth/Throat: Oropharynx is clear and moist.  Eyes: Conjunctivae are normal. Pupils are equal, round, and reactive to light.  Neck: Normal range of motion. No JVD present. No tracheal deviation present.  Cardiovascular: Normal rate, regular rhythm and intact  distal pulses.   Murmur heard. Radial pulse equal bilaterally  Pulmonary/Chest: Effort normal and breath sounds normal. No stridor. No respiratory distress. He has no wheezes. He has no rales. He exhibits no tenderness.  Abdominal: Soft. He exhibits no distension and no mass. There is no tenderness. There is no rebound and no guarding.  Musculoskeletal: Normal range of motion. He exhibits no edema or tenderness.  No calf asymmetry,  superficial collaterals, palpable cords, edema, Homans sign negative bilaterally.    Neurological: He is alert and oriented to person, place, and time.  Skin: Skin is warm. He is not diaphoretic.  Psychiatric: He has a normal mood and affect.  Nursing note and vitals reviewed.   ED Course  Procedures (including critical care time) Labs Review Labs Reviewed  BASIC METABOLIC PANEL - Abnormal; Notable for the following:    Glucose, Bld 112 (*)    All other components within normal limits  CBC - Abnormal; Notable for the following:    Platelets 149 (*)    All other components within normal limits  I-STAT TROPOININ, ED    Imaging Review Dg Chest 2 View  06/09/2015  CLINICAL DATA:  Chest pain.  Mitral valve prolapse EXAM: CHEST  2 VIEW COMPARISON:  None. FINDINGS: There is no edema or consolidation. The heart size and pulmonary vascularity are normal. No adenopathy. No pneumothorax. No bone lesions. IMPRESSION: No edema or consolidation. Electronically Signed   By: Bretta Bang III M.D.   On: 06/09/2015 12:37   I have personally reviewed and evaluated these images and lab results as part of my medical decision-making.   EKG Interpretation   Date/Time:  Wednesday June 09 2015 11:26:18 EST Ventricular Rate:  58 PR Interval:  195 QRS Duration: 118 QT Interval:  449 QTC Calculation: 441 R Axis:   34 Text Interpretation:  Sinus rhythm Nonspecific intraventricular conduction  delay Confirmed by Lincoln Brigham [id number]) on 06/09/2015 12:42:21 PM      MDM   Final diagnoses:  Palpitation    Filed Vitals:   06/09/15 1126 06/09/15 1132 06/09/15 1307 06/09/15 1330  BP:  157/97 151/79 145/77  Pulse:  59 52 50  Temp:  97.6 F (36.4 C)    TempSrc:  Oral    Resp:  SpO2: 95% 97% 97% 97%    Vaiden Adames is 75 y.o. male presenting with palpitations which last longer than normal and also are associated with pain and lightheadedness, they last about 5 seconds. Chart review  shows this patient had a 48 hour Holter monitor which showed nonsustained runs of VT. Patient's EKG today with no arrhythmia or ischemic changes, blood work reassuring including troponin. Considering this patient's age, comorbidities and structural cardiac issues I would like cardiology to formally evaluate him.  This is a shared visit with the attending physician who personally evaluated the patient and agrees with the care plan.   Patient became symptomatic at 13:49 the rhythm showed shows PVCs, no V. tach.  Cardiology has evaluated the patient, discussed it with PA Lisabeth Devoid who will arrange for outpatient EPS and stress test. Will increase his dosage of losartan. Patient is cleared for discharge from a cardiology standpoint. I think this patient is stable for discharge to home.  This is a shared visit with the attending physician who personally evaluated the patient and agrees with the care plan.     Wynetta Emery, PA-C 06/09/15 1552  Tilden Fossa, MD 06/10/15 [phone]

## 2015-06-09 NOTE — ED Notes (Signed)
Pt reports worsening pain with the pause between palpitations since this past Sunday.  States the pain previously lasted only a second but now is lasting about 5 seconds.  Reports there has been concern that his potassium may become too high taking a double dose of losartan compared to what he was on previously.  Was taken off of hydrochlorothiazide due to probability, per Dr Allyson Sabal, cardiologist, that it was causing his palpitations.

## 2015-06-09 NOTE — ED Notes (Signed)
Pt arrives from [facility] via [facility]. Pt states he has had heart palpitations intermittently for years, but has begun having pain with palpitations today. Pt states he has recently been taken off HCTZ and has had his losartan doubled about 2 months. Pt received  of ASA PTA. Pt has a history of mitral valve prolapse and recently saw Dr. Allyson Sabal for a stress test and was dx with minor carotid stenosis per ems.

## 2015-06-09 NOTE — Consult Note (Signed)
CARDIOLOGY CONSULT NOTE   Patient ID: Chad Berry MRN: [id number], DOB/AGE: [birth year]   Admit date: 06/09/2015 Date of Consult: 06/09/2015   Primary Physician: Almedia Balls, MD Primary Cardiologist: Dr. Allyson Sabal  Pt. Profile  75 year old Caucasian male with past medical history of chronic palpitation, SVT, PAC/PVC, mild to moderate MR, moderate TR, mitral prolapse, HTN, HLD, and history of mild carotid stenosis presented with palpitation.  Problem List  Past Medical History  Diagnosis Date  . Carotid stenosis     CAROTID DOPPLER, 12/07/2011 - Mild arthrosclerotic changes, no high-grade stenosis  . MVP (mitral valve prolapse)     a. Echo 12/07/2011 - EF-65-70%, severe mitral regurgitation, moderate-severe tricuspid regurgitation, moderate pulmonary HTN, moderate pulmonic regurgitation;  b. 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 Hypertension   . Hyperlipidemia   . Bradycardia     while on BB with HR to the 40s  . Rapid palpitations     event monitor with short bursts of SVT  . History of echocardiogram     Echo 9/16:  EF 60-65%, no RWMA, Gr 1 DD, holosystolic MVP of posterior leaflet, mild MR, LA upper limits of normal, normal RVF, mod TR, PASP 50 mmHg    Past Surgical History  Procedure Laterality Date  . No past surgeries       Allergies  No Known Allergies  HPI   The patient is a 75 year old Caucasian male with past medical history of chronic palpitation, SVT, PAC/PVC, mild to moderate MR, moderate TR, mitral prolapse, HTN, HLD, and history of mild carotid stenosis. His last echocardiogram obtained on 01/20/2015 showed EF 60-65%, grade 1 diastolic dysfunction, holosystolic prolapse of posterior leaflet, mild MR, moderate TR. 48-hour Holter monitor obtained on the same day showed PAC/PVC and one  episode of SVT.  According to the patient, he has chronic history of palpitation however the episode usually last only one second at the time. His hydrochlorothiazide was recently discontinued, his losartan was increased from 25 mg to 50 mg. Since Sunday, he has been having significant increase in palpitation. The duration of the palpitation also lasted longer than usual up to 5 seconds. He also described a pulsatile chest pain related to the palpitation at the same time. Last night along, it woke him up 4 times. He eventually decided to seek medical attention at [facility] on 2/80/2017. Despite taking him off HCTZ, potassium normal at 4.2. CXR normal. Troponin negative. Chest x-ray no acute process. EKG showed normal sinus rhythm without significant ST-T wave changes. Cardiology consulted for palpitation, while cardiology was in the room, he went into idioventricular rhythm with what appears to be retrograde P waves with heart rate of 70s. He started having the symptom at the same time. The idioventricular rhythm only lasted less than 10 seconds, and he was fully aware when it ended.    No current facility-administered medications on file prior to encounter.   Current Outpatient Prescriptions on File Prior to Encounter  Medication Sig Dispense Refill  . Magnesium 250 MG TABS Take 250 mg by mouth 2 (two) times daily.    . Pitavastatin Calcium (LIVALO) 2 MG TABS Take 1 tablet by mouth daily.       Family History Family History  Problem Relation Age of Onset  . Stroke Mother 75  . Stroke Brother 55  .  Heart failure Father      Social History Social History   Social History  . Marital Status: Married    Spouse Name: N/A  . Number of Children: N/A  . Years of Education: N/A   Occupational History  . Not on file.   Social History Main Topics  . Smoking status: Former Smoker    Quit date: 12/23/1972  . Smokeless tobacco: Not on file  . Alcohol Use: 0.0 oz/week    0 Standard  drinks or equivalent per week     Comment: occasionally  . Drug Use: No  . Sexual Activity: Not on file   Other Topics Concern  . Not on file   Social History Narrative     Review of Systems  General:  No chills, fever, night sweats or weight changes.  Cardiovascular:  No chest pain, dyspnea on exertion, edema, orthopnea, paroxysmal nocturnal dyspnea. +palpitations, pulsatile chest pain Dermatological: No rash, lesions/masses Respiratory: No cough, dyspnea Urologic: No hematuria, dysuria Abdominal:   No nausea, vomiting, diarrhea, bright red blood per rectum, melena, or hematemesis Neurologic:  No visual changes, wkns, changes in mental status. +mild dizziness, no presyncope or syncope All other systems reviewed and are otherwise negative except as noted above.  Physical Exam  Blood pressure 138/78, pulse 54, temperature 97.6 F (36.4 C), temperature source Oral, resp. rate 14, SpO2 96 %.  General: Pleasant, NAD Psych: Normal affect. Neuro: Alert and oriented X 3. Moves all extremities spontaneously. HEENT: Normal  Neck: Supple without bruits or JVD. Lungs:  Resp regular and unlabored, CTA. Heart: RRR no s3, s4, or murmurs. Abdomen: Soft, non-tender, non-distended, BS + x 4.  Extremities: No clubbing, cyanosis or edema. DP/PT/Radials 2+ and equal bilaterally.  Labs  No results for input(s): CKTOTAL, CKMB, TROPONINI in the last 72 hours. Lab Results  Component Value Date   WBC 4.9 06/09/2015   HGB 15.5 06/09/2015   HCT 44.8 06/09/2015   MCV 92.4 06/09/2015   PLT 149* 06/09/2015     Recent Labs Lab 06/09/15 1152  NA 140  K 4.2  CL 105  CO2 24  BUN 11  CREATININE 0.94  CALCIUM 9.2  GLUCOSE 112*    Radiology/Studies  Dg Chest 2 View  06/09/2015  CLINICAL DATA:  Chest pain.  Mitral valve prolapse EXAM: CHEST  2 VIEW COMPARISON:  None. FINDINGS: There is no edema or consolidation. The heart size and pulmonary vascularity are normal. No adenopathy. No  pneumothorax. No bone lesions. IMPRESSION: No edema or consolidation. Electronically Signed   By: Bretta Bang III M.D.   On: 06/09/2015 12:37    EKG  normal sinus rhythm without significant ST-T wave changes  ASSESSMENT AND PLAN  1. 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 [place], likely related to underlying mitral prolapse. Will arrange outpatient treadmill stress test and EP eval  - patient is cleared for discharge from cardiac perspective   2. mild to moderate MR/mitral prolapse 3. moderate TR 4. HTN: uncontrolled with SBP 150s in the ED, per Dr. [place], will increase losartan to 100 daily 5. HLD 6. history of mild carotid stenosis: mild on U/S 03/2015   SignedAzalee Course, PA-C 06/09/2015, 3:46 PM  Patient seen and examined and history reviewed. Agree with above findings and plan. Chad Berry is a very pleasant Chad Berry with long history of MV prolapse and MR who presents to the  ED today for evaluation of palpitations. He has a history of palpitations. Holter monitor this past fall showed multiple arrhythmias with PACs, PVCs, short bursts of PAT and NSVT. HR mean was 56. Did fairly well until this past Sunday when he began experiencing a different type of palpitation that was worse than before. This would wake him at night. He felt some lightheadedness and chest discomfort with this. No syncope. Very anxious about the way this makes him feel. On exam BP is elevated. During exam his symptoms occurred and he was noted to have a junctional rhythm with retrograde Ps and rate 60-70. This was not sustained. No JVD. Lungs are clear. CV shows a Gr 2-3/6 harsh late systolic murmur at the apex.  No edema.  Ecg is normal. Blood tests are normal.  Impression: Palpitations with multiple arrhythmias. Symptoms today correlate with a junctional rhythm. Known history of PACs, PVCs, NSVT, and PAT. Treatment  options limited due to bradycardia. I suspect these are related to his chronic MV prolapse. He does not use caffeine or Etoh. No decongestants. At this point I have recommended increasing his losartan to 100 mg daily for better BP control. Will arrange for outpatient stress testing to rule out ischemic trigger. I have reassured him that I do not think his arrhythmia is not dangerous but acknowledge that the symptoms can be upsetting. I think it would be a good idea for him to see EP as an outpatient. It does not appear that his MV prolapse is severe enough to need repair at this time.   Blanton Kardell [place], MDFACC 06/09/2015 3:59 PM

## 2015-06-09 NOTE — Discharge Instructions (Signed)
Please follow with your primary care doctor in the next 2 days for a check-up. They must obtain records for further management.   Do not hesitate to return to the Emergency Department for any new, worsening or concerning symptoms.    Palpitations A palpitation is the feeling that your heartbeat is irregular or is faster than normal. It may feel like your heart is fluttering or skipping a beat. Palpitations are usually not a serious problem. However, in some cases, you may need further medical evaluation. CAUSES  Palpitations can be caused by:  Smoking.  Caffeine or other stimulants, such as diet pills or energy drinks.  Alcohol.  Stress and anxiety.  Strenuous physical activity.  Fatigue.  Certain medicines.  Heart disease, especially if you have a history of irregular heart rhythms (arrhythmias), such as atrial fibrillation, atrial flutter, or supraventricular tachycardia.  An improperly working pacemaker or defibrillator. DIAGNOSIS  To find the cause of your palpitations, your health care provider will take your medical history and perform a physical exam. Your health care provider may also have you take a test called an ambulatory electrocardiogram (ECG). An ECG records your heartbeat patterns over a 24-hour period. You may also have other tests, such as:  Transthoracic echocardiogram (TTE). During echocardiography, sound waves are used to evaluate how blood flows through your heart.  Transesophageal echocardiogram (TEE).  Cardiac monitoring. This allows your health care provider to monitor your heart rate and rhythm in real time.  Holter monitor. This is a portable device that records your heartbeat and can help diagnose heart arrhythmias. It allows your health care provider to track your heart activity for several days, if needed.  Stress tests by exercise or by giving medicine that makes the heart beat faster. TREATMENT  Treatment of palpitations depends on the cause of  your symptoms and can vary greatly. Most cases of palpitations do not require any treatment other than time, relaxation, and monitoring your symptoms. Other causes, such as atrial fibrillation, atrial flutter, or supraventricular tachycardia, usually require further treatment. HOME CARE INSTRUCTIONS   Avoid:  Caffeinated coffee, tea, soft drinks, diet pills, and energy drinks.  Chocolate.  Alcohol.  Stop smoking if you smoke.  Reduce your stress and anxiety. Things that can help you relax include:  A method of controlling things in your body, such as your heartbeats, with your mind (biofeedback).  Yoga.  Meditation.  Physical activity such as swimming, jogging, or walking.  Get plenty of rest and sleep. SEEK MEDICAL CARE IF:   You continue to have a fast or irregular heartbeat beyond 24 hours.  Your palpitations occur more often. SEEK IMMEDIATE MEDICAL CARE IF:  You have chest pain or shortness of breath.  You have a severe headache.  You feel dizzy or you faint. MAKE SURE YOU:  Understand these instructions.  Will watch your condition.  Will get help right away if you are not doing well or get worse.   This information is not intended to replace advice given to you by your health care provider. Make sure you discuss any questions you have with your health care provider.   Document Released: 04/14/2000 Document Revised: 04/22/2013 Document Reviewed: 06/16/2011 Elsevier Interactive Patient Education [employer]

## 2015-06-16 ENCOUNTER — Telehealth (HOSPITAL_COMMUNITY): Payer: Self-pay | Admitting: *Deleted

## 2015-06-16 NOTE — Telephone Encounter (Signed)
Patient given detailed instructions per Myocardial Perfusion Study Information Sheet for the test on 06/18/15 at 0745. Patient notified to arrive 15 minutes early and that it is imperative to arrive on time for appointment to keep from having the test rescheduled.  If you need to cancel or reschedule your appointment, please call the office within 24 hours of your appointment. Failure to do so may result in a cancellation of your appointment, and a $50 no show fee. Patient verbalized understanding.Chad Berry, Adelene Idler

## 2015-06-18 ENCOUNTER — Ambulatory Visit (HOSPITAL_COMMUNITY): Payer: 59 | Attending: Cardiology

## 2015-06-18 DIAGNOSIS — I442 Atrioventricular block, complete: Secondary | ICD-10-CM | POA: Diagnosis not present

## 2015-06-18 DIAGNOSIS — R002 Palpitations: Secondary | ICD-10-CM | POA: Diagnosis not present

## 2015-06-18 DIAGNOSIS — I779 Disorder of arteries and arterioles, unspecified: Secondary | ICD-10-CM | POA: Insufficient documentation

## 2015-06-18 DIAGNOSIS — I10 Essential (primary) hypertension: Secondary | ICD-10-CM | POA: Diagnosis not present

## 2015-06-18 LAB — MYOCARDIAL PERFUSION IMAGING
CHL CUP NUCLEAR SDS: 1
CHL CUP NUCLEAR SSS: 5
CSEPED: 8 min
CSEPPHR: 139 {beats}/min
Estimated workload: 10.1 METS
Exercise duration (sec): 0 s
LHR: 0.31
LV dias vol: 134 mL
LVSYSVOL: 53 mL
MPHR: 148 {beats}/min
Percent HR: 93 %
Rest HR: 54 {beats}/min
SRS: 4
TID: 0.96

## 2015-06-18 MED ORDER — TECHNETIUM TC 99M SESTAMIBI GENERIC - CARDIOLITE
31.6000 | Freq: Once | INTRAVENOUS | Status: AC | PRN
  Administered 2015-06-18: 32 via INTRAVENOUS

## 2015-06-18 MED ORDER — TECHNETIUM TC 99M SESTAMIBI GENERIC - CARDIOLITE
10.6000 | Freq: Once | INTRAVENOUS | Status: AC | PRN
  Administered 2015-06-18: 11 via INTRAVENOUS

## 2015-06-23 ENCOUNTER — Encounter: Payer: Self-pay | Admitting: Internal Medicine

## 2015-06-23 ENCOUNTER — Ambulatory Visit (INDEPENDENT_AMBULATORY_CARE_PROVIDER_SITE_OTHER): Payer: 59 | Admitting: Internal Medicine

## 2015-06-23 VITALS — BP 140/82 | HR 58 | Ht 71.0 in | Wt 228.2 lb

## 2015-06-23 DIAGNOSIS — R002 Palpitations: Secondary | ICD-10-CM

## 2015-06-23 NOTE — Patient Instructions (Signed)
Medication Instructions:  Your physician recommends that you continue on your current medications as directed. Please refer to the Current Medication list given to you today.   Labwork: NONE  Testing/Procedures: NONE  Follow-Up: Your physician recommends that you schedule a follow-up appointment in: AS NEEDED      Any Other Special Instructions Will Be Listed Below (If Applicable).     If you need a refill on your cardiac medications before your next appointment, please call your pharmacy.   

## 2015-06-23 NOTE — Progress Notes (Signed)
HPI Mr. Chad Berry is referred today for evaluation of palpitations by Dr. Allyson Sabal. He is a pleasant 75 yo man with HTN, carotid stenosis, and dyslipidemia who was seen several weeks ago and had a cardiac monitor and is referred for additional evaluation. Prior to the episode, he admits to drinking too much caffeine and this (the palpitations) has improved since he stopped the caffeine.   No Known Allergies   Current Outpatient Prescriptions  Medication Sig Dispense Refill  . losartan (COZAAR) 100 MG tablet Take 1 tablet (100 mg total) by mouth daily. 90 tablet 3  . Magnesium 250 MG TABS Take 250 mg by mouth 2 (two) times daily.    . Pitavastatin Calcium (LIVALO) 2 MG TABS Take 1 tablet by mouth daily.     No current facility-administered medications for this visit.     Past Medical History  Diagnosis Date  . Carotid stenosis     CAROTID DOPPLER, 12/07/2011 - Mild arthrosclerotic changes, no high-grade stenosis  . MVP (mitral valve prolapse)     a. Echo 12/07/2011 - EF-65-70%, severe mitral regurgitation, moderate-severe tricuspid regurgitation, moderate pulmonary HTN, moderate pulmonic regurgitation;  b. 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 Hypertension   . Hyperlipidemia   . Bradycardia     while on BB with HR to the 40s  . Rapid palpitations     event monitor with short bursts of SVT  . History of echocardiogram     Echo 9/16:  EF 60-65%, no RWMA, Gr 1 DD, holosystolic MVP of posterior leaflet, mild MR, LA upper limits of normal, normal RVF, mod TR, PASP 50 mmHg    ROS:   All systems reviewed and negative except as noted in the HPI.   Past Surgical History  Procedure Laterality Date  . No past surgeries       Family History  Problem Relation Age of Onset  . Stroke Mother 35  . Stroke  Brother 59  . Heart failure Father      Social History   Social History  . Marital Status: Married    Spouse Name: N/A  . Number of Children: N/A  . Years of Education: N/A   Occupational History  . Not on file.   Social History Main Topics  . Smoking status: Former Smoker    Quit date: 12/23/1972  . Smokeless tobacco: Not on file  . Alcohol Use: 0.0 oz/week    0 Standard drinks or equivalent per week     Comment: occasionally  . Drug Use: No  . Sexual Activity: Not on file   Other Topics Concern  . Not on file   Social History Narrative     BP 140/82 mmHg  Pulse 58  Ht  (1.803 m)  Wt 228 lb 3.2 oz (103.511 kg)  BMI 31.84 kg/m2  SpO2 96%  Physical Exam:  Well appearing 75 yo man, NAD HEENT: Unremarkable Neck:  6 cm JVD, no thyromegally Lymphatics:  No adenopathy Back:  No CVA tenderness Lungs:  Clear with no wheezes HEART:  Regular rate rhythm, no murmurs, no rubs, no clicks Abd:  soft, positive bowel sounds, no organomegally, no rebound, no guarding Ext:  2 plus pulses, no edema, no cyanosis, no clubbing Skin:  No rashes no nodules Neuro:  CN II  through XII intact, motor grossly intact  EKG - reviewed - nsr  Assess/Plan: 1. Palpitations: his symptoms are much improved off of caffeine. He has had PVC's and NS atrial tachy on his 48 hour monitor. I have recommended that he undergo watchful waiting, avoid caffeine and ETOH, and a lack of sleep. We discussed anti-arrhythmic medications and beta blockers but his symptoms do not warrant additional medical therapy. 2. HTN - his blood pressure is controlled reasonably well. At home it is better.  3. Carotis stenosis - he is stable. followup with Dr. Dorma Russell.  Gregg Taylor,M.D. Leonia Reeves.D.

## 2015-09-21 DIAGNOSIS — K5792 Diverticulitis of intestine, part unspecified, without perforation or abscess without bleeding: Secondary | ICD-10-CM | POA: Insufficient documentation

## 2016-11-30 ENCOUNTER — Other Ambulatory Visit: Payer: Self-pay | Admitting: Family Medicine

## 2016-11-30 ENCOUNTER — Ambulatory Visit
Admission: RE | Admit: 2016-11-30 | Discharge: 2016-11-30 | Disposition: A | Discharge status: Home / Self Care | Payer: 59 | Source: Ambulatory Visit | Attending: Family Medicine | Admitting: Family Medicine

## 2016-11-30 DIAGNOSIS — R0789 Other chest pain: Secondary | ICD-10-CM

## 2017-01-09 ENCOUNTER — Ambulatory Visit
Admission: RE | Admit: 2017-01-09 | Discharge: 2017-01-09 | Disposition: A | Discharge status: Home / Self Care | Payer: 59 | Source: Ambulatory Visit | Attending: Family Medicine | Admitting: Family Medicine

## 2017-01-09 ENCOUNTER — Other Ambulatory Visit: Payer: Self-pay | Admitting: Family Medicine

## 2017-01-09 DIAGNOSIS — R109 Unspecified abdominal pain: Secondary | ICD-10-CM

## 2017-01-09 MED ORDER — IOPAMIDOL (ISOVUE-300) INJECTION 61%
100.0000 mL | Freq: Once | INTRAVENOUS | Status: AC | PRN
  Administered 2017-01-09: 100 mL via INTRAVENOUS

## 2017-01-22 ENCOUNTER — Telehealth: Payer: Self-pay

## 2017-01-22 NOTE — Telephone Encounter (Signed)
Records sent from Dr [facility] office of patients past colonoscopy report from [facility], Dr Donnajean Lopes and also his CT scan done 01/09/17 at [facility], which showed abnormal sigmoid colon.  Per Dr Leone Payor he needs a flex sig which Dr Leone Payor can do in October.  He is out of the office this week so I've let Dr Geoffery Lyons office know what he needs and I'll see what 7:30AM spots are available when Dr Leone Payor returns and I'll call Dr Geoffery Lyons office back.

## 2017-02-02 ENCOUNTER — Encounter: Payer: Self-pay | Admitting: Internal Medicine

## 2017-02-02 NOTE — Telephone Encounter (Signed)
Patient has been set up for a pre-visit on 02/07/17 at 1:30pm and to have his flex sig on 02/14/17 at 9:00AM with Dr Leone Payor.  New patient information will be mailed out to patient. Dr [employer] office has been informed of these dates.

## 2017-02-07 ENCOUNTER — Telehealth: Payer: Self-pay | Admitting: *Deleted

## 2017-02-07 NOTE — Telephone Encounter (Signed)
Patient no show pv today. Called patient, no answer, left message for pt to call us back before 5 pm to reschedule this appointment.

## 2017-02-08 ENCOUNTER — Ambulatory Visit (AMBULATORY_SURGERY_CENTER): Payer: Self-pay | Admitting: *Deleted

## 2017-02-08 VITALS — Ht 70.5 in | Wt 228.4 lb

## 2017-02-08 DIAGNOSIS — Z1211 Encounter for screening for malignant neoplasm of colon: Secondary | ICD-10-CM

## 2017-02-08 NOTE — Progress Notes (Signed)
No egg or soy allergy known to patient  No issues with past sedation with any surgeries  or procedures, no intubation problems  No diet pills per patient No home 02 use per patient  No blood thinners per patient  Pt denies issues with constipation pt. States he had it yesterday for the first time.   No A fib or A flutter  EMMI video sent to pt's e mail

## 2017-02-14 ENCOUNTER — Ambulatory Visit (AMBULATORY_SURGERY_CENTER): Payer: 59 | Admitting: Internal Medicine

## 2017-02-14 ENCOUNTER — Encounter: Payer: Self-pay | Admitting: Internal Medicine

## 2017-02-14 VITALS — BP 126/63 | HR 50 | Temp 97.3°F | Resp 18 | Ht 71.0 in | Wt 228.0 lb

## 2017-02-14 DIAGNOSIS — K642 Third degree hemorrhoids: Secondary | ICD-10-CM | POA: Diagnosis not present

## 2017-02-14 DIAGNOSIS — R933 Abnormal findings on diagnostic imaging of other parts of digestive tract: Secondary | ICD-10-CM | POA: Diagnosis not present

## 2017-02-14 DIAGNOSIS — K5730 Diverticulosis of large intestine without perforation or abscess without bleeding: Secondary | ICD-10-CM | POA: Diagnosis not present

## 2017-02-14 HISTORY — PX: SIGMOIDOSCOPY: SUR1295

## 2017-02-14 HISTORY — DX: Third degree hemorrhoids: K64.2

## 2017-02-14 MED ORDER — SODIUM CHLORIDE 0.9 % IV SOLN: 500.0000 mL | INTRAVENOUS | Status: DC

## 2017-02-14 NOTE — Patient Instructions (Addendum)
   You have severe diverticulosis and thickening of the folds which is what the radiologist saw. No tumors.  You do have significant hemorrhoids and I think I can fix them and improve your quality of life.  I appreciate the opportunity to care for you. Iva Booparl E. Gessner, MD, FACG  YOU HAD AN ENDOSCOPIC PROCEDURE TODAY AT THE [facility]:   Refer to the procedure report that was given to you for any specific questions about what was found during the examination.  If the procedure report does not answer your questions, please call your gastroenterologist to clarify.  If you requested that your care partner not be given the details of your procedure findings, then the procedure report has been included in a sealed envelope for you to review at your convenience later.  YOU SHOULD EXPECT: Some feelings of bloating in the abdomen. Passage of more gas than usual.  Walking can help get rid of the air that was put into your GI tract during the procedure and reduce the bloating. If you had a lower endoscopy (such as a colonoscopy or flexible sigmoidoscopy) you may notice spotting of blood in your stool or on the toilet paper. If you underwent a bowel prep for your procedure, you may not have a normal bowel movement for a few days.  Please Note:  You might notice some irritation and congestion in your nose or some drainage.  This is from the oxygen used during your procedure.  There is no need for concern and it should clear up in a day or so.  SYMPTOMS TO REPORT IMMEDIATELY:   Following lower endoscopy (colonoscopy or flexible sigmoidoscopy):  Excessive amounts of blood in the stool  Significant tenderness or worsening of abdominal pains  Swelling of the abdomen that is new, acute  Fever of 100F or higher  For urgent or emergent issues, a gastroenterologist can be reached at any hour by calling [phone].   DIET:  We do recommend a small meal at first, but then you may proceed  to your regular diet.  Drink plenty of fluids but you should avoid alcoholic beverages for 24 hours.  ACTIVITY:  You should plan to take it easy for the rest of today and you should NOT DRIVE or use heavy machinery until tomorrow (because of the sedation medicines used during the test).    FOLLOW UP: Our staff will call the number listed on your records the next business day following your procedure to check on you and address any questions or concerns that you may have regarding the information given to you following your procedure. If we do not reach you, we will leave a message.  However, if you are feeling well and you are not experiencing any problems, there is no need to return our call.  We will assume that you have returned to your regular daily activities without incident.  If any biopsies were taken you will be contacted by phone or by letter within the next 1-3 weeks.  Please call us at [phone] if you have not heard about the biopsies in 3 weeks.    SIGNATURES/CONFIDENTIALITY: You and/or your care partner have signed paperwork which will be entered into your electronic medical record.  These signatures attest to the fact that that the information above on your After Visit Summary has been reviewed and is understood.  Full responsibility of the confidentiality of this discharge information lies with you and/or your care-partner.

## 2017-02-14 NOTE — Progress Notes (Signed)
No changes in medical or surgical hx since PV per pt.  He states his hemorrhoids "flare up monthly".  He states they are flared up now and he is having some bleeding.  He will let Dr. Leone PayorGessner know when he speaks to him in the procedure room.

## 2017-02-14 NOTE — Op Note (Signed)
[facility] Patient Name: Chad Berry Procedure Date: 02/14/2017 9:07 AM MRN: [id number] Endoscopist: Iva Boop , MD Age: 76 Referring MD:  Date of Birth: July 17, 1940 Gender: Male Account #: [id number] Procedure:                Flexible Sigmoidoscopy Indications:              Abnormal CT of the GI tract Medicines:                Propofol per Anesthesia, Monitored Anesthesia Care Procedure:                Pre-Anesthesia Assessment:                           - Prior to the procedure, a History and Physical                            was performed, and patient medications and                            allergies were reviewed. The patient's tolerance of                            previous anesthesia was also reviewed. The risks                            and benefits of the procedure and the sedation                            options and risks were discussed with the patient.                            All questions were answered, and informed consent                            was obtained. Prior Anticoagulants: The patient has                            taken no previous anticoagulant or antiplatelet                            agents. ASA Grade Assessment: II - A patient with                            mild systemic disease. After reviewing the risks                            and benefits, the patient was deemed in                            satisfactory condition to undergo the procedure.                           After obtaining informed consent, the scope was  passed under direct vision. The Colonoscope was                            introduced through the anus and advanced to the the                            descending colon. The flexible sigmoidoscopy was                            accomplished without difficulty. The patient                            tolerated the procedure well. The quality of the                            bowel  preparation was good. Scope In: Scope Out: Findings:                 The perianal exam findings include non-thrombosed                            internal hemorrhoids and internal hemorrhoids that                            prolapse with straining, but require manual                            replacement into the anal canal (Grade III).                           The digital rectal exam was normal.                           Many small and large-mouthed diverticula were found                            in the sigmoid colon. There was narrowing of the                            colon in association with the diverticular opening.                            There was no evidence of diverticular bleeding.                           The exam was otherwise without abnormality.                           retroflex - internal hemorrhoids Impression:               - Non-thrombosed internal hemorrhoids and internal                            hemorrhoids that prolapse with straining, but  require manual replacement into the anal canal                            (Grade III) found on perianal exam.                           - Severe diverticulosis in the sigmoid colon. There                            was narrowing of the colon in association with the                            diverticular opening. There was no evidence of                            diverticular bleeding.                           - The examination was otherwise normal.                           - No specimens collected. Recommendation:           - High fiber diet.                           - Patient has a contact number available for                            emergencies. The signs and symptoms of potential                            delayed complications were discussed with the                            patient. Return to normal activities tomorrow.                            Written discharge instructions  were provided to the                            patient. Iva Boop, MD 02/14/2017 9:30:08 AM This report has been signed electronically.

## 2017-02-14 NOTE — Progress Notes (Signed)
A and O x3. Report to RN. Tolerated MAC anesthesia well.

## 2017-02-15 ENCOUNTER — Telehealth: Payer: Self-pay

## 2017-02-15 NOTE — Telephone Encounter (Signed)
-----   Message from Iva Booparl E Gessner, MD sent at 02/14/2017  9:40 AM EDT ----- Regarding: banding appt Please arrange

## 2017-02-15 NOTE — Telephone Encounter (Signed)
  Follow up Call-  Call back number 02/14/2017  Post procedure Call Back phone  # [phone]  Permission to leave phone message Yes  Some recent data might be hidden     Left message

## 2017-02-15 NOTE — Telephone Encounter (Signed)
Left message

## 2017-02-15 NOTE — Telephone Encounter (Signed)
Patient called.  He declines to schedule at the time, "I have corn to harvest".  Can't do until December, but he does not want to schedule right now.  He understands to call back when he is ready

## 2017-10-08 ENCOUNTER — Encounter: Payer: Self-pay | Admitting: Cardiovascular Disease

## 2017-11-14 ENCOUNTER — Ambulatory Visit (HOSPITAL_COMMUNITY): Payer: 59 | Attending: Cardiology

## 2017-11-14 ENCOUNTER — Other Ambulatory Visit: Payer: Self-pay | Admitting: Family Medicine

## 2017-11-14 ENCOUNTER — Encounter (INDEPENDENT_AMBULATORY_CARE_PROVIDER_SITE_OTHER): Payer: Self-pay

## 2017-11-14 ENCOUNTER — Other Ambulatory Visit: Payer: Self-pay

## 2017-11-14 DIAGNOSIS — I371 Nonrheumatic pulmonary valve insufficiency: Secondary | ICD-10-CM | POA: Diagnosis not present

## 2017-11-14 DIAGNOSIS — E785 Hyperlipidemia, unspecified: Secondary | ICD-10-CM | POA: Diagnosis not present

## 2017-11-14 DIAGNOSIS — R002 Palpitations: Secondary | ICD-10-CM | POA: Diagnosis not present

## 2017-11-14 DIAGNOSIS — I059 Rheumatic mitral valve disease, unspecified: Secondary | ICD-10-CM | POA: Diagnosis not present

## 2017-11-14 DIAGNOSIS — I081 Rheumatic disorders of both mitral and tricuspid valves: Secondary | ICD-10-CM | POA: Insufficient documentation

## 2017-11-14 DIAGNOSIS — I10 Essential (primary) hypertension: Secondary | ICD-10-CM | POA: Insufficient documentation

## 2017-11-14 DIAGNOSIS — Z8249 Family history of ischemic heart disease and other diseases of the circulatory system: Secondary | ICD-10-CM | POA: Diagnosis not present

## 2017-11-14 DIAGNOSIS — Z87891 Personal history of nicotine dependence: Secondary | ICD-10-CM | POA: Diagnosis not present

## 2017-11-14 DIAGNOSIS — R001 Bradycardia, unspecified: Secondary | ICD-10-CM | POA: Insufficient documentation

## 2017-11-14 DIAGNOSIS — I341 Nonrheumatic mitral (valve) prolapse: Secondary | ICD-10-CM | POA: Insufficient documentation

## 2017-12-24 ENCOUNTER — Telehealth: Payer: Self-pay | Admitting: Cardiovascular Disease

## 2017-12-24 NOTE — Telephone Encounter (Signed)
[facility], Appt 12/26/17. @ 8:15AM. [place].

## 2017-12-26 ENCOUNTER — Ambulatory Visit (INDEPENDENT_AMBULATORY_CARE_PROVIDER_SITE_OTHER): Payer: 59 | Admitting: Cardiovascular Disease

## 2017-12-26 ENCOUNTER — Encounter: Payer: Self-pay | Admitting: Cardiovascular Disease

## 2017-12-26 VITALS — BP 132/74 | HR 53 | Ht 71.0 in | Wt 231.2 lb

## 2017-12-26 DIAGNOSIS — I340 Nonrheumatic mitral (valve) insufficiency: Secondary | ICD-10-CM | POA: Diagnosis not present

## 2017-12-26 DIAGNOSIS — R002 Palpitations: Secondary | ICD-10-CM

## 2017-12-26 DIAGNOSIS — I341 Nonrheumatic mitral (valve) prolapse: Secondary | ICD-10-CM

## 2017-12-26 NOTE — Assessment & Plan Note (Signed)
History of essential hypertension her blood pressure measured at 132/74.  He is on losartan.  Continue current meds.

## 2017-12-26 NOTE — Patient Instructions (Signed)
Medication Instructions:  Your physician recommends that you continue on your current medications as directed. Please refer to the Current Medication list given to you today.   Labwork: none  Testing/Procedures: Your physician has requested that you have an echocardiogram. Echocardiography is a painless test that uses sound waves to create images of your heart. It provides your doctor with information about the size and shape of your heart and how well your heart's chambers and valves are working. This procedure takes approximately one hour. There are no restrictions for this procedure.  Schedule for 11/2018  Follow-Up: Your physician wants you to follow-up in: 12 months with Dr. Allyson SabalBerry. You will receive a reminder letter in the mail two months in advance. If you don't receive a letter, please call our office to schedule the follow-up appointment.   Any Other Special Instructions Will Be Listed Below (If Applicable).     If you need a refill on your cardiac medications before your next appointment, please call your pharmacy.

## 2017-12-26 NOTE — Assessment & Plan Note (Signed)
History of posterior leaflet prolapse with moderate to severe MR which is been a long-standing problem.  His most recent 2D echo performed 11/14/2017 revealed normal LV size and function.  He is completely asymptomatic.  We will continue to follow this on an annual basis by 2D echo.

## 2017-12-26 NOTE — Assessment & Plan Note (Signed)
History of palpitations in the past with PACs and PVCs on Holter monitoring.  He was evaluated by Dr. Ladona Ridgelaylor 2 years ago who recommended watchful waiting, avoidance of alcohol and caffeine.

## 2017-12-26 NOTE — Assessment & Plan Note (Signed)
History of hyperlipidemia on Crestor and Zetia followed by his PCP with excellent lipid profile.

## 2017-12-26 NOTE — Progress Notes (Signed)
12/26/2017 Chad SalisburyJohn Berry   Oct 13, 1940  [id number]  Primary Physician Mosetta PuttBlomgren, Peter, MD Primary Cardiologist: Runell GessJonathan J Caitlynne Harbeck MD Nicholes CalamityFACP, FACC, FAHA, [place]  HPI:  Chad SalisburyJohn Kady is a 77 y.o.  mildly overweight, married Caucasian male, father of 2, grandfather to 2 grandchildren whose son Casimiro NeedleMichael is also a patient of mine, as is his wife. I saw him last 03/02/2015. He was worked up for palpitations and had an echo that showed severe MR with mild mitral valve prolapse and mild LV size and function. His other problems include hypertension and hyperlipidemia. He lives an active lifestyle and is actually heading out to [place] to go [place] for a week. He has changed his diet and has lost 15 pounds. He has cut out caffeine and alcohol. He has had 2 negative sleep studies. He is otherwise asymptomatic. Since I saw him several years ago he has seen Tereso NewcomerScott Weaver in the office for evaluation of palpitations. A 2-D echo revealed normal LV function with holosystolic mitral valve prolapse, mild MR and moderate TR. A 48 hour Holter monitor showed PVCs, short runs of nonsustained ventricular tachycardia and short runs of SVT. Since stopping his diuretic his palpitations have improved.  I did refer him to Dr. Ladona Ridgelaylor who recommended conservative therapy noting he had PACs and PVCs.  He recommended avoidance of caffeine and alcohol.  He is referred back by Dr. Duaine DredgeBlomgren for follow-up of his mitral regurgitation because of a murmur which he thought was louder than he had remembered.  Repeat 2D echo performed 11/14/2017 revealed moderate to severe MR with posterior leaflet prolapse, normal LV size and function.  He is completely asymptomatic.  He purchased his Arts administratorZR1 Corvette and [place].    Current Meds  Medication Sig  . ezetimibe (ZETIA) 10 MG tablet Take 10 mg by mouth daily.  [name]. losartan (COZAAR) 100 MG tablet Take 1 tablet (100 mg total) by mouth daily.  . rosuvastatin (CRESTOR) 20  MG tablet Take 20 mg by mouth daily.     No Known Allergies  Social History   Socioeconomic History  . Marital status: Married    Spouse name: Not on file  . Number of children: Not on file  . Years of education: Not on file  . Highest education level: Not on file  Occupational History  . Not on file  Social Needs  . Financial resource strain: Not on file  . Food insecurity:    Worry: Not on file    Inability: Not on file  . Transportation needs:    Medical: Not on file    Non-medical: Not on file  Tobacco Use  . Smoking status: Former Smoker    Last attempt to quit: 12/23/1972    Years since quitting: 45.0  . Smokeless tobacco: Former NeurosurgeonUser    Types: Chew  Substance and Sexual Activity  . Alcohol use: Yes    Alcohol/week: 0.0 standard drinks    Comment: occasionally  . Drug use: No  . Sexual activity: Not on file  Lifestyle  . Physical activity:    Days per week: Not on file    Minutes per session: Not on file  . Stress: Not on file  Relationships  . Social connections:    Talks on phone: Not on file    Gets together: Not on file    Attends religious service: Not on file    Active member of club or organization: Not on file  Attends meetings of clubs or organizations: Not on file    Relationship status: Not on file  . Intimate partner violence:    Fear of current or ex partner: Not on file    Emotionally abused: Not on file    Physically abused: Not on file    Forced sexual activity: Not on file  Other Topics Concern  . Not on file  Social History Narrative  . Not on file     Review of Systems: General: negative for chills, fever, night sweats or weight changes.  Cardiovascular: negative for chest pain, dyspnea on exertion, edema, orthopnea, palpitations, paroxysmal nocturnal dyspnea or shortness of breath Dermatological: negative for rash Respiratory: negative for cough or wheezing Urologic: negative for hematuria Abdominal: negative for nausea,  vomiting, diarrhea, bright red blood per rectum, melena, or hematemesis Neurologic: negative for visual changes, syncope, or dizziness All other systems reviewed and are otherwise negative except as noted above.    Blood pressure 132/74, pulse (!) 53, height 5\' 11"  (1.803 m), weight 231 lb 3.2 oz (104.9 kg).  General appearance: alert and no distress Neck: no adenopathy, no carotid bruit, no JVD, supple, symmetrical, trachea midline and thyroid not enlarged, symmetric, no tenderness/mass/nodules Lungs: clear to auscultation bilaterally Heart: 3/6 apical systolic murmur heard best at the apex consistent with MR. Extremities: extremities normal, atraumatic, no cyanosis or edema Pulses: 2+ and symmetric Skin: Skin color, texture, turgor normal. No rashes or lesions Neurologic: Alert and oriented X 3, normal strength and tone. Normal symmetric reflexes. Normal coordination and gait  EKG sinus bradycardia 53 with occasional PACs.  I personally reviewed this EKG.  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  His most recent 2D echo performed 11/14/2017 revealed normal LV size and function.  He is completely asymptomatic.  We will continue to follow this on an annual basis by 2D echo.  Essential hypertension History of essential hypertension her blood pressure measured at 132/74.  He is on losartan.  Continue current meds.  Hyperlipidemia History of hyperlipidemia on Crestor and Zetia followed by his PCP with excellent lipid profile.  Palpitation History of palpitations in the past with PACs and PVCs on Holter monitoring.  He was evaluated by Dr. Ladona Ridgel 2 years ago who recommended watchful waiting, avoidance of alcohol and caffeine.      Runell Gess MD FACP,FACC,FAHA, [facility] 12/26/2017 12:08 PM

## 2018-10-10 ENCOUNTER — Telehealth (HOSPITAL_COMMUNITY): Payer: Self-pay | Admitting: Radiology

## 2018-10-10 NOTE — Telephone Encounter (Signed)
Left message to call office-Patient needs to schedule an echocardiogram.  

## 2018-11-13 ENCOUNTER — Ambulatory Visit (HOSPITAL_COMMUNITY): Payer: 59

## 2019-01-20 ENCOUNTER — Other Ambulatory Visit (HOSPITAL_COMMUNITY): Payer: 59

## 2019-03-11 ENCOUNTER — Ambulatory Visit (INDEPENDENT_AMBULATORY_CARE_PROVIDER_SITE_OTHER): Payer: 59 | Admitting: Cardiovascular Disease

## 2019-03-11 ENCOUNTER — Other Ambulatory Visit: Payer: Self-pay

## 2019-03-11 ENCOUNTER — Encounter: Payer: Self-pay | Admitting: Cardiovascular Disease

## 2019-03-11 VITALS — BP 140/70 | HR 56 | Temp 97.2°F | Ht 71.0 in | Wt 227.0 lb

## 2019-03-11 DIAGNOSIS — R002 Palpitations: Secondary | ICD-10-CM | POA: Diagnosis not present

## 2019-03-11 DIAGNOSIS — I340 Nonrheumatic mitral (valve) insufficiency: Secondary | ICD-10-CM | POA: Diagnosis not present

## 2019-03-11 NOTE — Assessment & Plan Note (Signed)
History of hyperlipidemia on Crestor and Zetia followed by his PCP 

## 2019-03-11 NOTE — Assessment & Plan Note (Signed)
History of essential hypertension blood pressure measured today 140/70.  He is on losartan.

## 2019-03-11 NOTE — Patient Instructions (Signed)
Medication Instructions:  Your physician recommends that you continue on your current medications as directed. Please refer to the Current Medication list given to you today.  If you need a refill on your cardiac medications before your next appointment, please call your pharmacy.   Lab work: NONE  Testing/Procedures: Your physician has requested that you have an echocardiogram now and again before your year follow-up appointment. Echocardiography is a painless test that uses sound waves to create images of your heart. It provides your doctor with information about the size and shape of your heart and how well your heart's chambers and valves are working. This procedure takes approximately one hour. There are no restrictions for this procedure. [address]   Follow-Up: At [facility], you and your health needs are our priority.  As part of our continuing mission to provide you with exceptional heart care, we have created designated Provider Care Teams.  These Care Teams include your primary Cardiologist (physician) and Advanced Practice Providers (APPs -  Physician Assistants and Nurse Practitioners) who all work together to provide you with the care you need, when you need it. You may see Dr Gwenlyn Found or one of the following Advanced Practice Providers on your designated Care Team:    Kerin Ransom, PA-C  [place], [place]  Coletta Memos, [place]   Your physician wants you to follow-up in: 1 year after Echo. You will receive a reminder letter in the mail two months in advance. If you don't receive a letter, please call our office to schedule the follow-up appointment.  Any Other Special Instructions Will Be Listed Below (If Applicable). Need a second Echo prior to 1 year follow-up.

## 2019-03-11 NOTE — Progress Notes (Signed)
03/11/2019 Chad Berry   26-Nov-1940  [id number]  Primary Physician Mosetta Putt, MD Primary Cardiologist: Runell Gess MD Nicholes Calamity, [place]  HPI:  Chad Berry is a 78 y.o.   mildly to moderately overweight, married Caucasian male, father of 2, grandfather to 2 grandchildren whose son Chad Berry is also a patient of mine, as is his wife. I saw him last  12/26/2017. He was worked up for palpitations and had an echo that showed severe MR with mild mitral valve prolapse and mild LV size and function. His other problems include hypertension and hyperlipidemia. He lives an active lifestyle and is actually heading out to [place] to go coyote hunting for a week. He has changed his diet and has lost 15 pounds. He has cut out caffeine and alcohol. He has had 2 negative sleep studies. He is otherwise asymptomatic. Since I saw him several years ago he has seen Tereso Newcomer in the office for evaluation of palpitations. A 2-D echo revealed normal LV function with holosystolic mitral valve prolapse, mild MR and moderate TR. A 48 hour Holter monitor showed PVCs, short runs of nonsustained ventricular tachycardia and short runs of SVT. Since stopping his diuretic his palpitations have improved.  I did refer him to Dr. Ladona Ridgel who recommended conservative therapy noting he had PACs and PVCs.  He recommended avoidance of caffeine and alcohol.  He is referred back by Dr. Duaine Dredge for follow-up of his mitral regurgitation because of a murmur which he thought was louder than he had remembered.  Repeat 2D echo performed 11/14/2017 revealed moderate to severe MR with posterior leaflet prolapse, normal LV size and function.  He is completely asymptomatic.  He purchased his ZR1 Corvette and [place] 150 mph.  Since I saw him approximately a year ago he has remained completely asymptomatic.  He specifically denies chest pain or shortness of breath.  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   Current Meds  Medication Sig  . ezetimibe (ZETIA) 10 MG tablet Take 10 mg by mouth daily.  [name] levothyroxine (SYNTHROID) 100 MCG tablet Take 100 mcg by mouth daily before breakfast.  . losartan (COZAAR) 100 MG tablet Take 1 tablet (100 mg total) by mouth daily.  . rosuvastatin (CRESTOR) 40 MG tablet Take 40 mg by mouth daily.  . [DISCONTINUED] rosuvastatin (CRESTOR) 20 MG tablet Take 40 mg by mouth daily.     No Known Allergies  Social History   Socioeconomic History  . Marital status: Married    Spouse name: Not on file  . Number of children: Not on file  . Years of education: Not on file  . Highest education level: Not on file  Occupational History  . Not on file  Social Needs  . Financial resource strain: Not on file  . Food insecurity    Worry: Not on file    Inability: Not on file  . Transportation needs    Medical: Not on file    Non-medical: Not on file  Tobacco Use  . Smoking status: Former Smoker    Quit date: 12/23/1972    Years since quitting: 46.2  . Smokeless tobacco: Former Neurosurgeon    Types: Chew  Substance and Sexual Activity  . Alcohol use: Yes    Alcohol/week: 0.0 standard drinks    Comment: occasionally  . Drug  use: No  . Sexual activity: Not on file  Lifestyle  . Physical activity    Days per week: Not on file    Minutes per session: Not on file  . Stress: Not on file  Relationships  . Social Herbalist on phone: Not on file    Gets together: Not on file    Attends religious service: Not on file    Active member of club or organization: Not on file    Attends meetings of clubs or organizations: Not on file    Relationship status: Not on file  . Intimate partner violence    Fear of current or ex partner: Not on file    Emotionally abused: Not on file    Physically abused: Not on file    Forced sexual activity: Not on file  Other  Topics Concern  . Not on file  Social History Narrative  . Not on file     Review of Systems: General: negative for chills, fever, night sweats or weight changes.  Cardiovascular: negative for chest pain, dyspnea on exertion, edema, orthopnea, palpitations, paroxysmal nocturnal dyspnea or shortness of breath Dermatological: negative for rash Respiratory: negative for cough or wheezing Urologic: negative for hematuria Abdominal: negative for nausea, vomiting, diarrhea, bright red blood per rectum, melena, or hematemesis Neurologic: negative for visual changes, syncope, or dizziness All other systems reviewed and are otherwise negative except as noted above.    Blood pressure 140/70, pulse (!) 56, temperature (!) 97.2 F (36.2 C), height 5\' 11"  (1.803 m), weight 227 lb (103 kg).  General appearance: alert and no distress Neck: no adenopathy, no JVD, supple, symmetrical, trachea midline, thyroid not enlarged, symmetric, no tenderness/mass/nodules and Bilateral carotid bruits versus transmitted murmur Lungs: clear to auscultation bilaterally Heart: 2-3 over 6 apical holosystolic murmur consistent with mitral vegetation Extremities: extremities normal, atraumatic, no cyanosis or edema Pulses: 2+ and symmetric Skin: Skin color, texture, turgor normal. No rashes or lesions Neurologic: Alert and oriented X 3, normal strength and tone. Normal symmetric reflexes. Normal coordination and gait  EKG sinus bradycardia 56 with nonspecific ST and T wave changes.  I personally reviewed this EKG.  ASSESSMENT AND PLAN:   Mitral valve prolapse History of moderate to severe MR secondary prolapse of the posterior leaflet with normal LV size and function.  He does have biatrial enlargement with moderate pulmonary hypertension and PA pressure of 42 although he is completely asymptomatic.  I am going to refer him to Dr. Burt Knack for further evaluation.  Essential hypertension History of essential  hypertension blood pressure measured today 140/70.  He is on losartan.  Hyperlipidemia History of hyperlipidemia on Crestor and Zetia followed by his PCP  Palpitation History of palpitations in the past improved with limiting his caffeine and alcohol intake.  He does occasionally get palpitations every 3 to 4 weeks which are fairly self-limited.      Lorretta Harp MD FACP,FACC,FAHA, [facility] 03/11/2019 8:57 AM

## 2019-03-11 NOTE — Assessment & Plan Note (Signed)
History of palpitations in the past improved with limiting his caffeine and alcohol intake.  He does occasionally get palpitations every 3 to 4 weeks which are fairly self-limited.

## 2019-03-11 NOTE — Assessment & Plan Note (Signed)
History of moderate to severe MR secondary prolapse of the posterior leaflet with normal LV size and function.  He does have biatrial enlargement with moderate pulmonary hypertension and PA pressure of 42 although he is completely asymptomatic.  I am going to refer him to Dr. Burt Knack for further evaluation.

## 2019-03-19 ENCOUNTER — Other Ambulatory Visit (HOSPITAL_COMMUNITY): Payer: 59

## 2019-03-19 ENCOUNTER — Other Ambulatory Visit: Payer: Self-pay | Admitting: Family Medicine

## 2019-03-19 DIAGNOSIS — Z8719 Personal history of other diseases of the digestive system: Secondary | ICD-10-CM

## 2019-03-19 DIAGNOSIS — R1030 Lower abdominal pain, unspecified: Secondary | ICD-10-CM

## 2019-04-01 ENCOUNTER — Other Ambulatory Visit: Payer: Self-pay

## 2019-04-01 ENCOUNTER — Ambulatory Visit (HOSPITAL_COMMUNITY): Payer: 59 | Attending: Cardiovascular Disease

## 2019-04-01 DIAGNOSIS — I340 Nonrheumatic mitral (valve) insufficiency: Secondary | ICD-10-CM | POA: Diagnosis not present

## 2019-04-02 ENCOUNTER — Other Ambulatory Visit: Payer: 59

## 2019-05-02 DIAGNOSIS — A498 Other bacterial infections of unspecified site: Secondary | ICD-10-CM

## 2019-05-02 HISTORY — DX: Other bacterial infections of unspecified site: A49.8

## 2019-05-14 ENCOUNTER — Ambulatory Visit
Admission: RE | Admit: 2019-05-14 | Discharge: 2019-05-14 | Disposition: A | Discharge status: Home / Self Care | Payer: 59 | Source: Ambulatory Visit | Attending: Family Medicine | Admitting: Family Medicine

## 2019-05-14 ENCOUNTER — Other Ambulatory Visit: Payer: Self-pay

## 2019-05-14 ENCOUNTER — Other Ambulatory Visit: Payer: Self-pay | Admitting: Family Medicine

## 2019-05-14 DIAGNOSIS — K5732 Diverticulitis of large intestine without perforation or abscess without bleeding: Secondary | ICD-10-CM

## 2019-05-14 DIAGNOSIS — R1030 Lower abdominal pain, unspecified: Secondary | ICD-10-CM

## 2019-05-14 MED ORDER — IOPAMIDOL (ISOVUE-300) INJECTION 61%
100.0000 mL | Freq: Once | INTRAVENOUS | Status: AC | PRN
  Administered 2019-05-14: 100 mL via INTRAVENOUS

## 2019-05-14 NOTE — Progress Notes (Signed)
Patient observed for over 30 minutes after CT contrast reaction (a few hives, cough, nausea), and receiving IV Benadryl, per Dr. Karin Golden.  Patient's 20g NSL discontinued from his right [facility] space; site unremarkable.  He denies any of the symptoms he experienced with the contrast administration.  He also is wide awake/alert, so I told him he was okay to drive himself home.  I gave him two PO Benadryl to take home with him in case he sprouted any more hives.  He stated an understanding to call 911 if he develops any shortness of breath.

## 2019-05-14 NOTE — Progress Notes (Signed)
Called into CT scan room due to pt having hives post CT contrast. Dr. Karin Golden in room. Pt A&O x4, talking, appears to be in no distress. Hives noted on pts trunk and back. 50mg  of IV benadryl given to pt through Right AC IV per Dr. . Pt now at nurses station, with stable vitals, will continue to monitor.

## 2019-05-15 ENCOUNTER — Ambulatory Visit (INDEPENDENT_AMBULATORY_CARE_PROVIDER_SITE_OTHER): Payer: 59 | Admitting: Internal Medicine

## 2019-05-15 ENCOUNTER — Other Ambulatory Visit: Payer: 59

## 2019-05-15 ENCOUNTER — Encounter: Payer: Self-pay | Admitting: Gastroenterology

## 2019-05-15 VITALS — BP 100/52 | HR 76 | Temp 98.3°F | Ht 69.0 in | Wt 213.2 lb

## 2019-05-15 DIAGNOSIS — R197 Diarrhea, unspecified: Secondary | ICD-10-CM

## 2019-05-15 DIAGNOSIS — R1084 Generalized abdominal pain: Secondary | ICD-10-CM | POA: Diagnosis not present

## 2019-05-15 DIAGNOSIS — R509 Fever, unspecified: Secondary | ICD-10-CM

## 2019-05-15 MED ORDER — VANCOMYCIN HCL 125 MG PO CAPS: 125.0000 mg | ORAL_CAPSULE | Freq: Four times a day (QID) | ORAL | 0 refills | Status: AC

## 2019-05-15 NOTE — Progress Notes (Signed)
Chad Berry 79 y.o. 18-Nov-1940 [id number]  Assessment & Plan:   Encounter Diagnoses  Name Primary?  . Watery diarrhea Yes  . Fever, unspecified fever cause   . Generalized abdominal pain     I think he most likely has C diff colitis from prior appropriate  Abx treatment for diverticulitis. His sxs are significant and the clinical scenario convincing so I will test and treat at the same time.  He was advised to hydrate aggressively and oral rehydration recipes provided.  Orders Placed This Encounter  Procedures  . Clostridium difficile Toxin B, Qualitative, Real-Time PCR    Meds ordered this encounter  Medications  . vancomycin (VANCOCIN) 125 MG capsule    Sig: Take 1 capsule (125 mg total) by mouth 4 (four) times daily for 10 days.    Dispense:  40 capsule    Refill:  0      Subjective:   Chief Complaint: abdominal pain  HPI 79 yo wm w/ hx diverticulitis and had LLQ pain 1-2 month or so ago - Tx for diverticulitis w/ Abx and better - then recurrence - had CT abd/pelvis [facility] 11/23 w/ mild fat stranding and haziness around distal descending colon - c/w divertculitis - Tx again w/ Abx and got better. Abx = cipro and metronidazole   Now few days ago sudden onset lower abdominal pain and fever T 100.4 F and watery stools. He did not have similar pattern prior episodes, this is different. Had CT abd/pelvis [place] [facility] yesterday w/ slight mucosal edema and pericolonic soft tissue inflammation in left colon, radiologist favored colitis over diverticulitis.  No bleeding.  Feels bad and says quite fatigued also and stools round the clock.  CT abd/pelvs 05/14/19 IMPRESSION: 1. Slight mucosal edema and pericolonic soft tissue inflammation extending from the proximal descending colon through the sigmoid colon. This could represent extensive mild diverticulitis. Alternatively, this could represent colitis in an area of extensive diverticulosis. Given the extent of the  abnormality, I favor the latter. 2. Slight enlargement of bilateral renal cysts. 3. Aortic Atherosclerosis (ICD10-I70.0).   Electronically Signed   By: Francene Boyers M.D.   On: 05/14/2019 12:43  CBC NL 05/14/2019 CMET also NL UA 1+ protein 1+ ketone   02/14/2017 Flex sig by me because of LLQ pain problems ? diverticulitis  Non-thrombosed internal hemorrhoids and internal hemorrhoids that prolapse with straining, but require manual replacement into the anal canal (Grade III) found on perianal exam. - Severe diverticulosis in the sigmoid colon. There was narrowing of the colon in association with the diverticular opening. There was no evidence of diverticular bleeding. - The examination was otherwise normal. Allergies  Allergen Reactions  . Contrast Media [Iodinated Diagnostic Agents] Hives and Cough    Pt will need a 13 hour prep before any contrast per Dr. Paulina Fusi (05/14/19)  . Hctz [Hydrochlorothiazide] Other (See Comments)    palpitations  . Metoprolol Succinate [Metoprolol] Other (See Comments)    Bradycardia     Current Meds  Medication Sig  . ezetimibe (ZETIA) 10 MG tablet Take 10 mg by mouth daily.  [name] levothyroxine (SYNTHROID) 100 MCG tablet Take 100 mcg by mouth daily before breakfast.  . losartan (COZAAR) 100 MG tablet Take 1 tablet (100 mg total) by mouth daily.  . rosuvastatin (CRESTOR) 40 MG tablet Take 40 mg by mouth daily.   Past Medical History:  Diagnosis Date  . Aortic atherosclerosis (HCC)   . Bradycardia    while on BB with HR  to the 62s  . Carotid stenosis    CAROTID DOPPLER, 12/07/2011 - Mild arthrosclerotic changes, no high-grade stenosis  . Cataract    rt. eye  . Diverticulitis   . Heart murmur   . History of echocardiogram    Echo 9/16:  EF 60-65%, no RWMA, Gr 1 DD, holosystolic MVP of posterior leaflet, mild MR, LA upper limits of normal, normal RVF, mod TR, PASP 50 mmHg  . Hypercholesteremia   . Hyperlipidemia   . Hypertension   .  Hypothyroidism   . MVP (mitral valve prolapse)    a. Echo 12/07/2011 - EF-65-70%, severe mitral regurgitation, moderate-severe tricuspid regurgitation, moderate pulmonary HTN, moderate pulmonic regurgitation;  b. 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 Prolapsed internal hemorrhoids, grade 3 02/14/2017  . PVC (premature ventricular contraction)   . Rapid palpitations    event monitor with short bursts of SVT  . Renal cyst   . Testicular hypofunction   . Vitamin D deficiency    Past Surgical History:  Procedure Laterality Date  . COLONOSCOPY    . 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   Review of Systems As per HPI  Objective:   Physical Exam BP (!) 100/52 (BP Location: Left Arm, Patient Position: Sitting, Cuff Size: Normal)   Pulse 76   Temp 98.3 F (36.8 C)   Ht 5\' 9"  (1.753 m) Comment: height measured without shoes  Wt 213 lb 4 oz (96.7 kg)   BMI 31.49 kg/m  NAD, elderly wm Lungs cta Cor S1s2 Gr 3 HSM abd soft w/ mod lower abd bilat tenderness, BS +

## 2019-05-15 NOTE — Patient Instructions (Signed)
I think you have C diff colitis. Please read the hanadout.  I am staring medication to treat and we will test for it.    Stay hydrated - see recipes below. Soft diet.   I appreciate the opportunity to care for you. Iva Boop, MD, [facility]     ORAL REHYDRATION SOLUTION RECIPES   Sugar and salt water   . 1 quart water .  teaspoon salt . 6 teaspoons sugar . Optional: Crystal Light to taste (especially lemonade or orange-pineapple flavors)   Gatorade G2   . 4 cups Gatorade G2 (or one, 32 ounce bottle) . 1/2 teaspoon salt   Chicken Broth   . 4 cups water . 1 dry chicken broth cube .  teaspoon salt . 2 tablespoon sugar OR . 2 cups liquid broth . 2 cups water . 2 tablespoon sugar  Tomato Juice   . 2  cups tomato juice . 1  cups water  Homemade Cereal Based  .  cup dry, precooked baby rice cereal . 2 cups water .  teaspoon salt . Combine ingredients and mix until well dissolved and smooth. Refrigerate. Solution should be thick, but pourable and drinkable.

## 2019-05-16 ENCOUNTER — Other Ambulatory Visit: Payer: 59

## 2019-05-16 DIAGNOSIS — R197 Diarrhea, unspecified: Secondary | ICD-10-CM

## 2019-05-17 ENCOUNTER — Encounter: Payer: Self-pay | Admitting: Internal Medicine

## 2019-05-19 LAB — CLOSTRIDIUM DIFFICILE TOXIN B, QUALITATIVE, REAL-TIME PCR: Toxigenic C. Difficile by PCR: DETECTED — AB

## 2019-06-06 ENCOUNTER — Other Ambulatory Visit: Payer: Self-pay

## 2019-06-06 ENCOUNTER — Ambulatory Visit (INDEPENDENT_AMBULATORY_CARE_PROVIDER_SITE_OTHER): Payer: 59 | Admitting: Internal Medicine

## 2019-06-06 ENCOUNTER — Encounter: Payer: Self-pay | Admitting: Internal Medicine

## 2019-06-06 VITALS — BP 130/72 | HR 68 | Temp 98.3°F | Ht 69.0 in | Wt 214.6 lb

## 2019-06-06 DIAGNOSIS — A0472 Enterocolitis due to Clostridium difficile, not specified as recurrent: Secondary | ICD-10-CM

## 2019-06-06 DIAGNOSIS — Z8719 Personal history of other diseases of the digestive system: Secondary | ICD-10-CM | POA: Diagnosis not present

## 2019-06-06 NOTE — Progress Notes (Addendum)
Chad Berry 79 y.o. 09-27-1940 [id number]  Assessment & Plan:   C. difficile colitis Clinical remission.  I explained how bowel habits can take a while to return to normal.  I have asked him to take Florastor 250 mg twice daily for a month.  Should he develop watery diarrhea again I told him to call me and let me know I would be inclined to treat first and test later and probably not test if it sounds like a relatively recent recurrence which I hope he does not get.  He will be C. difficile PCR positive for some time going forward at least.  History of diverticulitis I explained how this can be difficult to diagnose, and that we really do not understand diverticulosis and diverticulitis well.  There is no medication to prevent this and should he get symptoms again we can have him switch to a liquid diet for a few days and have asked him to call me if it goes on for more than 2 or 3 days and then we can consider treatment versus another imaging study to see what is happening.  I explained the concept of symptomatic diverticulosis which could be playing a role.  He should avoid getting constipated and can use Benefiber as needed for that.  He does have a contrast allergy now to keep in mind if CT scanning is to be considered again in the future.    I appreciate the opportunity to care for this patient. CC: Derinda Late, MD  Subjective:   Chief Complaint: Follow-up of C. difficile colitis and history of diverticulitis  HPI The patient is here for follow-up after he was diagnosed with C. difficile colitis which most likely resulted from treatment for diverticulitis with antibiotics.  He is much better though not back to normal.  I had seen him on January 14 and started vancomycin and testing did confirm C. difficile.  He said by about day 5 he started to turn the corner though he felt like he was very sick and is still weak and recovering.  Not back to normal.  Up to 3 bowel movements a  day still some tenesmus and mild abdominal discomfort but clearly significantly better.  Stools can be loose and gassy at times as well.   He also has had diagnoses of diverticulitis in the past and wanted to discuss what he could do to prevent that.  We talked about how it can be a difficult diagnosis to make and it may be clinical at times, and based upon his initial CT scan this fall diverticulitis made sense and I think his treatment was appropriate which she is not questioning.  He still has occasional difficulty with his hemorrhoids but is not inclined to do treatment because "it is not a problem".   Social history is notable for that he is hoping to return to [place] and run his Corvette's ER 1 on the track again in the spring.  He showed me pictures of his new trailer which lowers to Street level making it easy to load his car given the low ground clearance. Allergies  Allergen Reactions  . Contrast Media [Iodinated Diagnostic Agents] Hives and Cough    Pt will need a 13 hour prep before any contrast per Dr. Nelson Chimes (05/14/19)  . Hctz [Hydrochlorothiazide] Other (See Comments)    palpitations  . Metoprolol Succinate [Metoprolol] Other (See Comments)    Bradycardia     Current Meds  Medication Sig  . cholecalciferol (  VITAMIN D3) 25 MCG (1000 UNIT) tablet Take 1,000 Units by mouth daily.  [name] ezetimibe (ZETIA) 10 MG tablet Take 10 mg by mouth daily.  [name] levothyroxine (SYNTHROID) 100 MCG tablet Take 100 mcg by mouth daily before breakfast.  . losartan (COZAAR) 100 MG tablet Take 1 tablet (100 mg total) by mouth daily.  . rosuvastatin (CRESTOR) 40 MG tablet Take 40 mg by mouth daily.   Past Medical History:  Diagnosis Date  . Aortic atherosclerosis (HCC)   . Bradycardia    while on BB with HR to the 40s  . Carotid stenosis    CAROTID DOPPLER, 12/07/2011 - Mild arthrosclerotic changes, no high-grade stenosis  . Cataract    rt. eye  . Clostridium difficile infection   .  Diverticulitis   . Heart murmur   . History of echocardiogram    Echo 9/16:  EF 60-65%, no RWMA, Gr 1 DD, holosystolic MVP of posterior leaflet, mild MR, LA upper limits of normal, normal RVF, mod TR, PASP 50 mmHg  . Hypercholesteremia   . Hyperlipidemia   . Hypertension   . Hypothyroidism   . MVP (mitral valve prolapse)    a. Echo 12/07/2011 - EF-65-70%, severe mitral regurgitation, moderate-severe tricuspid regurgitation, moderate pulmonary HTN, moderate pulmonic regurgitation;  b. 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 Prolapsed internal hemorrhoids, grade 3 02/14/2017  . PVC (premature ventricular contraction)   . Rapid palpitations    event monitor with short bursts of SVT  . Renal cyst   . Testicular hypofunction   . Vitamin D deficiency    Past Surgical History:  Procedure Laterality Date  . COLONOSCOPY    . 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   Review of Systems   Objective:   Physical Exam

## 2019-06-06 NOTE — Patient Instructions (Signed)
Please purchase Florastor 250mg  over the counter. Take one twice a day for a month. This is a probiotic to put the good bacteria back into your colon.   If you think your getting diverticulitis go on a liquid diet for 2-3 days. If no better after that call .   If you get constipated use a tablespoon of benefiber nightly.   I appreciate the opportunity to care for you. [place], MD, [facility]

## 2019-06-06 NOTE — Assessment & Plan Note (Signed)
I explained how this can be difficult to diagnose, and that we really do not understand diverticulosis and diverticulitis well.  There is no medication to prevent this and should he get symptoms again we can have him switch to a liquid diet for a few days and have asked him to call me if it goes on for more than 2 or 3 days and then we can consider treatment versus another imaging study to see what is happening.  I explained the concept of symptomatic diverticulosis which could be playing a role.  He should avoid getting constipated and can use Benefiber as needed for that.  He does have a contrast allergy now to keep in mind if CT scanning is to be considered again in the future.

## 2019-06-06 NOTE — Assessment & Plan Note (Addendum)
Clinical remission.  I explained how bowel habits can take a while to return to normal.  I have asked him to take Florastor 250 mg twice daily for a month.  Should he develop watery diarrhea again I told him to call me and let me know I would be inclined to treat first and test later and probably not test if it sounds like a relatively recent recurrence which I hope he does not get.  He will be C. difficile PCR positive for some time going forward at least.

## 2020-03-05 ENCOUNTER — Other Ambulatory Visit: Payer: Self-pay | Admitting: Family Medicine

## 2020-03-05 ENCOUNTER — Ambulatory Visit
Admission: RE | Admit: 2020-03-05 | Discharge: 2020-03-05 | Disposition: A | Discharge status: Home / Self Care | Payer: 59 | Source: Ambulatory Visit | Attending: Family Medicine | Admitting: Family Medicine

## 2020-03-05 DIAGNOSIS — R0781 Pleurodynia: Secondary | ICD-10-CM

## 2020-03-09 ENCOUNTER — Telehealth (HOSPITAL_COMMUNITY): Payer: Self-pay | Admitting: Cardiovascular Disease

## 2020-03-09 DIAGNOSIS — I340 Nonrheumatic mitral (valve) insufficiency: Secondary | ICD-10-CM

## 2020-03-09 NOTE — Telephone Encounter (Signed)
Patient is scheduled for echocardiogram tomorrow and we need an order please. I see that recall states that Dr. Allyson Sabal wanted in November. Thank you so much for your help.

## 2020-03-09 NOTE — Addendum Note (Signed)
Addended by: Alyson Ingles on: 03/09/2020 12:15 PM   Modules accepted: Orders

## 2020-03-09 NOTE — Telephone Encounter (Signed)
I spoke with patient at 2:38pm on 03/09/20 and informed him that we are still waiting on PA# from [facility].  Patient states that he needs test done due to he is having issues and will just be a SELF PAY if insurance does not  give PA# before appt tomorrow 03/10/20.

## 2020-03-09 NOTE — Telephone Encounter (Signed)
Order entered

## 2020-03-10 ENCOUNTER — Ambulatory Visit (HOSPITAL_COMMUNITY): Payer: 59 | Attending: Cardiovascular Disease

## 2020-03-10 ENCOUNTER — Other Ambulatory Visit: Payer: Self-pay

## 2020-03-10 DIAGNOSIS — I340 Nonrheumatic mitral (valve) insufficiency: Secondary | ICD-10-CM | POA: Insufficient documentation

## 2020-03-10 LAB — ECHOCARDIOGRAM COMPLETE
Area-P 1/2: 4.21 cm2
S' Lateral: 3.6 cm

## 2020-03-11 ENCOUNTER — Telehealth: Payer: Self-pay | Admitting: Cardiovascular Disease

## 2020-03-11 ENCOUNTER — Other Ambulatory Visit: Payer: Self-pay

## 2020-03-11 DIAGNOSIS — I341 Nonrheumatic mitral (valve) prolapse: Secondary | ICD-10-CM

## 2020-03-11 DIAGNOSIS — I10 Essential (primary) hypertension: Secondary | ICD-10-CM

## 2020-03-11 DIAGNOSIS — I340 Nonrheumatic mitral (valve) insufficiency: Secondary | ICD-10-CM

## 2020-03-11 DIAGNOSIS — I499 Cardiac arrhythmia, unspecified: Secondary | ICD-10-CM

## 2020-03-11 NOTE — Telephone Encounter (Signed)
Pt wanted to discuss his echo results

## 2020-03-11 NOTE — Telephone Encounter (Signed)
I can see him on 11/30 unless his Sx are getting worse

## 2020-03-11 NOTE — Telephone Encounter (Signed)
Spoke with patient who called in to receive echo results. Patient advised of the following results:  Chad Gess, MD  03/11/2020 6:49 AM EST     Nl LV systolic FXN with G2DD , MVP with severe MR. No change since last 2D. Repeat 12 months   Patient made aware and verbalized understanding. Patient states that over the last 2-3 months he has begun feeling worse, patient states that he just over all feels bad and is unable to describe the feeling in detail but states he almost feels like he is "hung over" all the time but does not drink alcohol. Patient states that he has had incidents of chest pain with the last incident 4 days ago, patient states that the chest pain occurred when he was rolling over in bed from his right side to his left side and lasted for less than a minute before resolving. Patient states that he has had these same incidents of chest pain over the last 7-10 years and it is not new chest pain. Patient states that it could be muscle related but is unsure. Patient also states that he has had his physical and follow up with PCP and has told PCP about current symptoms and per patient the PCP states that all of patients blood work and vital signs look good. Patient unable to give numbers for blood pressure and heart rate but states that it was good at last visit with PCP. Patient does state that he has had palpitations in the past, and slight dizziness when standing too fast but denies any of those symptoms today. Patient also denies any active chest pain. Patient would like to ask Dr. Allyson Sabal if any of his symptoms could be related to his MVP and MR. Made patient an appointment with Dr. Allyson Sabal 11/30 to discuss but advised patient to call back and let us know if a sooner appointment is needed with APP. Also advised patient to seek medical attention for any new or worsened active chest pain. Advised patient I would forward message to Dr. Allyson Sabal for review and advice. Patient verbalized  understanding.

## 2020-03-11 NOTE — Telephone Encounter (Signed)
He needs a 2D echo prior to his OV with me

## 2020-03-12 NOTE — Telephone Encounter (Signed)
Spoke with patient advised patient of the following:  Chad Gess, MD      I can see him on 11/30 unless his Sx are getting worse       Patient verbalized understanding.

## 2020-03-30 ENCOUNTER — Encounter: Payer: Self-pay | Admitting: Cardiovascular Disease

## 2020-03-30 ENCOUNTER — Ambulatory Visit: Payer: 59 | Admitting: Cardiovascular Disease

## 2020-03-30 ENCOUNTER — Ambulatory Visit (INDEPENDENT_AMBULATORY_CARE_PROVIDER_SITE_OTHER): Payer: 59 | Admitting: Cardiovascular Disease

## 2020-03-30 ENCOUNTER — Other Ambulatory Visit: Payer: Self-pay

## 2020-03-30 VITALS — BP 124/68 | HR 61 | Ht 69.0 in | Wt 223.0 lb

## 2020-03-30 DIAGNOSIS — I340 Nonrheumatic mitral (valve) insufficiency: Secondary | ICD-10-CM | POA: Diagnosis not present

## 2020-03-30 DIAGNOSIS — I10 Essential (primary) hypertension: Secondary | ICD-10-CM | POA: Diagnosis not present

## 2020-03-30 DIAGNOSIS — E782 Mixed hyperlipidemia: Secondary | ICD-10-CM | POA: Diagnosis not present

## 2020-03-30 DIAGNOSIS — I6523 Occlusion and stenosis of bilateral carotid arteries: Secondary | ICD-10-CM

## 2020-03-30 NOTE — Assessment & Plan Note (Signed)
History of essential hypertension a blood pressure measured today 124/68.  He is on losartan.

## 2020-03-30 NOTE — Progress Notes (Signed)
03/30/2020 [name]   1940/10/16  [id number]  Primary Physician Mosetta Putt, MD Primary Cardiologist: Runell Gess MD [name]  HPI:  Chad Berry is a 79 y.o.  moderately overweight, married Caucasian male, father of 2, grandfather to 2 grandchildren whose son [name] is also a patient of mine, as is his wife. I saw him last11/01/2019. He was worked up for palpitations and had an echo that showed severe MR with mild mitral valve prolapse and mild LV size and function. His other problems include hypertension and hyperlipidemia. He lives an active lifestyle and is actually heading out to [place] to go coyote hunting for a week. He has changed his diet and has lost 15 pounds. He has cut out caffeine and alcohol. He has had 2 negative sleep studies. He is otherwise asymptomatic. Since I saw him several years ago he has seen Tereso Newcomer in the office for evaluation of palpitations. A 2-D echo revealed normal LV function with holosystolic mitral valve prolapse, mild MR and moderate TR. A 48 hour Holter monitor showed PVCs, short runs of nonsustained ventricular tachycardia and short runs of SVT. Since stopping his diuretic his palpitations have improved.  I did refer him to Dr. Ladona Ridgel who recommended conservative therapy noting he had PACs and PVCs. He recommended avoidance of caffeine and alcohol. He is referred back by Dr. Duaine Dredge for follow-up of his mitral regurgitation because of a murmur which he thought was louder than he had remembered. Repeat 2D echo performed 11/14/2017 revealed moderate to severe MR with posterior leaflet prolapse, normal LV size and function. He is completely asymptomatic. He purchased his ZR1Corvette and recently rode [place] 150 mph.  Since I saw him approximately a year ago he has remained completely asymptomatic.  He specifically denies chest pain or shortness of breath.  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  His most recent 2D echo performed 03/10/2020 revealed normal LV size and function with severe MR and mild left atrial enlargement.  Current Meds  Medication Sig  . cholecalciferol (VITAMIN D3) 25 MCG (1000 UNIT) tablet Take 1,000 Units by mouth daily.  [name] ezetimibe (ZETIA) 10 MG tablet Take 10 mg by mouth daily.  [name] levothyroxine (SYNTHROID) 100 MCG tablet Take 100 mcg by mouth daily before breakfast.  . losartan (COZAAR) 100 MG tablet Take 1 tablet (100 mg total) by mouth daily.  . rosuvastatin (CRESTOR) 40 MG tablet Take 40 mg by mouth daily.     Allergies  Allergen Reactions  . Contrast Media [Iodinated Diagnostic Agents] Hives and Cough    Pt will need a 13 hour prep before any contrast per Dr. Paulina Fusi (05/14/19)  . Hctz [Hydrochlorothiazide] Other (See Comments)    palpitations  . Metoprolol Succinate [Metoprolol] Other (See Comments)    Bradycardia      Social History   Socioeconomic History  . Marital status: Married    Spouse name: Not on file  . Number of children: Not on file  . Years of education: Not on file  . Highest education level: Not on file  Occupational History  . Not on file  Tobacco Use  . Smoking status: Former Smoker    Quit date: 12/23/1972    Years since quitting: 47.2  . Smokeless tobacco: Former Neurosurgeon    Types: Engineer, drilling  .  Vaping Use: Never used  Substance and Sexual Activity  . Alcohol use: Yes    Alcohol/week: 0.0 standard drinks    Comment: occasionally  . Drug use: No  . Sexual activity: Not on file  Other Topics Concern  . 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 Difficulty of Paying Living Expenses: Not on file  Food Insecurity:   .  Worried About Programme researcher, broadcasting/film/video in the Last Year: Not on file  . Ran Out of Food in the Last Year: Not on file  Transportation Needs:   . Lack of Transportation (Medical): Not on file  . Lack of Transportation (Non-Medical): Not on file  Physical Activity:   . Days of Exercise per Week: Not on file  . Minutes of Exercise per Session: Not on file  Stress:   . Feeling of Stress : Not on file  Social Connections:   . Frequency of Communication with Friends and Family: Not on file  . Frequency of Social Gatherings with Friends and Family: Not on file  . Attends Religious Services: Not on file  . Active Member of Clubs or Organizations: Not on file  . Attends Banker Meetings: Not on file  . Marital Status: Not on file  Intimate Partner Violence:   . Fear of Current or Ex-Partner: Not on file  . Emotionally Abused: Not on file  . Physically Abused: Not on file  . Sexually Abused: Not on file     Review of Systems: General: negative for chills, fever, night sweats or weight changes.  Cardiovascular: negative for chest pain, dyspnea on exertion, edema, orthopnea, palpitations, paroxysmal nocturnal dyspnea or shortness of breath Dermatological: negative for rash Respiratory: negative for cough or wheezing Urologic: negative for hematuria Abdominal: negative for nausea, vomiting, diarrhea, bright red blood per rectum, melena, or hematemesis Neurologic: negative for visual changes, syncope, or dizziness All other systems reviewed and are otherwise negative except as noted above.    Blood pressure 124/68, pulse 61, height 5\' 9"  (1.753 m), weight 223 lb (101.2 kg).  General appearance: alert and no distress Neck: no adenopathy, no JVD, supple, symmetrical, trachea midline, thyroid not enlarged, symmetric, no tenderness/mass/nodules and Soft right carotid bruit Lungs: clear to auscultation bilaterally Heart: 2-3 over 6 apical systolic murmur consistent with mitral  vegetation. Extremities: extremities normal, atraumatic, no cyanosis or edema Pulses: 2+ and symmetric Skin: Skin color, texture, turgor normal. No rashes or lesions Neurologic: Alert and oriented X 3, normal strength and tone. Normal symmetric reflexes. Normal coordination and gait  EKG sinus rhythm at 61 with a nonspecific IVCD.  I personally reviewed this EKG.  ASSESSMENT AND PLAN:   Essential hypertension History of essential hypertension a blood pressure measured today 124/68.  He is on losartan.  Hyperlipidemia History of hyperlipidemia on rosuvastatin and Zetia followed by his PCP  Mitral regurgitation History of severe MR which we have been following by 2D echo for several years.  His most recent echo performed 03/10/2020 shows normal LV size and function with severe MR and a mildly dilated left atrium.  He is totally asymptomatic.  I am going to arrange him to see Dr. 13/01/2020 for surgical consultation.  He may be candidate for minimally invasive mitral valve repair while he  is relatively young and healthy before he develops LV dysfunction.      Runell Gess MD FACP,FACC,FAHA, [facility] 03/30/2020 12:22 PM

## 2020-03-30 NOTE — Assessment & Plan Note (Signed)
History of severe MR which we have been following by 2D echo for several years.  His most recent echo performed 03/10/2020 shows normal LV size and function with severe MR and a mildly dilated left atrium.  He is totally asymptomatic.  I am going to arrange him to see Dr. Cornelius Moras for surgical consultation.  He may be candidate for minimally invasive mitral valve repair while he is relatively young and healthy before he develops LV dysfunction.

## 2020-03-30 NOTE — Patient Instructions (Signed)
Medication Instructions:  Your physician recommends that you continue on your current medications as directed. Please refer to the Current Medication list given to you today.  *If you need a refill on your cardiac medications before your next appointment, please call your pharmacy*   Testing/Procedures: Your physician has requested that you have a carotid duplex. This test is an ultrasound of the carotid arteries in your neck. It looks at blood flow through these arteries that supply the brain with blood. Allow one hour for this exam. There are no restrictions or special instructions. [address]  Your physician has requested that you have an echocardiogram. Echocardiography is a painless test that uses sound waves to create images of your heart. It provides your doctor with information about the size and shape of your heart and how well your heart's chambers and valves are working. This procedure takes approximately one hour. There are no restrictions for this procedure. To be done in about a year.       Follow-Up: At [facility], you and your health needs are our priority.  As part of our continuing mission to provide you with exceptional heart care, we have created designated Provider Care Teams.  These Care Teams include your primary Cardiologist (physician) and Advanced Practice Providers (APPs -  Physician Assistants and Nurse Practitioners) who all work together to provide you with the care you need, when you need it.  We recommend signing up for the patient portal called "MyChart".  Sign up information is provided on this After Visit Summary.  MyChart is used to connect with patients for Virtual Visits (Telemedicine).  Patients are able to view lab/test results, encounter notes, upcoming appointments, etc.  Non-urgent messages can be sent to your provider as well.   To learn more about what you can do with MyChart, go to [email or web address].    Your next  appointment:   12 month(s)  The format for your next appointment:   In Person  Provider:   Nanetta Batty, MD

## 2020-03-30 NOTE — Addendum Note (Signed)
Addended by: Bernita Buffy on: 03/30/2020 01:42 PM   Modules accepted: Orders

## 2020-03-30 NOTE — Assessment & Plan Note (Signed)
History of hyperlipidemia on rosuvastatin and Zetia followed by his PCP

## 2020-03-31 NOTE — Addendum Note (Signed)
Addended by: Bernita Buffy on: 03/31/2020 05:24 PM   Modules accepted: Orders

## 2020-04-01 ENCOUNTER — Telehealth: Payer: Self-pay

## 2020-04-01 NOTE — Telephone Encounter (Signed)
Spoke with pt on the phone. Gave instructions on TEE scheduled for Jan. 4th.  Lab/covid swab instructions given as well. Pt states understanding.

## 2020-04-12 ENCOUNTER — Ambulatory Visit (HOSPITAL_COMMUNITY)
Admission: RE | Admit: 2020-04-12 | Discharge: 2020-04-12 | Disposition: A | Discharge status: Home / Self Care | Payer: 59 | Source: Ambulatory Visit | Attending: Cardiology | Admitting: Cardiology

## 2020-04-12 ENCOUNTER — Other Ambulatory Visit: Payer: Self-pay

## 2020-04-12 DIAGNOSIS — I6523 Occlusion and stenosis of bilateral carotid arteries: Secondary | ICD-10-CM | POA: Diagnosis not present

## 2020-04-16 ENCOUNTER — Other Ambulatory Visit: Payer: Self-pay | Admitting: Thoracic Surgery (Cardiothoracic Vascular Surgery)

## 2020-04-16 DIAGNOSIS — I7100 Dissection of unspecified site of aorta: Secondary | ICD-10-CM

## 2020-04-16 NOTE — Progress Notes (Unsigned)
CT

## 2020-05-03 ENCOUNTER — Other Ambulatory Visit (HOSPITAL_COMMUNITY)
Admission: RE | Admit: 2020-05-03 | Discharge: 2020-05-03 | Disposition: A | Discharge status: Home / Self Care | Payer: 59 | Source: Ambulatory Visit | Attending: Cardiology | Admitting: Cardiology

## 2020-05-03 DIAGNOSIS — Z20822 Contact with and (suspected) exposure to covid-19: Secondary | ICD-10-CM | POA: Diagnosis not present

## 2020-05-03 DIAGNOSIS — Z01818 Encounter for other preprocedural examination: Secondary | ICD-10-CM | POA: Insufficient documentation

## 2020-05-03 LAB — CBC
Hematocrit: 41.5 % (ref 37.5–51.0)
Hemoglobin: 14.2 g/dL (ref 13.0–17.7)
MCH: 32.7 pg (ref 26.6–33.0)
MCHC: 34.2 g/dL (ref 31.5–35.7)
MCV: 96 fL (ref 79–97)
Platelets: 152 10*3/uL (ref 150–450)
RBC: 4.34 x10E6/uL (ref 4.14–5.80)
RDW: 12.4 % (ref 11.6–15.4)
WBC: 5.6 10*3/uL (ref 3.4–10.8)

## 2020-05-03 LAB — BASIC METABOLIC PANEL
BUN/Creatinine Ratio: 11 (ref 10–24)
BUN: 13 mg/dL (ref 8–27)
CO2: 24 mmol/L (ref 20–29)
Calcium: 8.8 mg/dL (ref 8.6–10.2)
Chloride: 106 mmol/L (ref 96–106)
Creatinine, Ser: 1.14 mg/dL (ref 0.76–1.27)
GFR calc Af Amer: 71 mL/min/{1.73_m2} (ref >59)
GFR calc non Af Amer: 62 mL/min/{1.73_m2} (ref >59)
Glucose: 121 mg/dL — ABNORMAL HIGH (ref 65–99)
Potassium: 4.6 mmol/L (ref 3.5–5.2)
Sodium: 140 mmol/L (ref 134–144)

## 2020-05-03 LAB — SARS CORONAVIRUS 2 (TAT 6-24 HRS): SARS Coronavirus 2: NEGATIVE

## 2020-05-04 ENCOUNTER — Encounter (HOSPITAL_COMMUNITY): Payer: Self-pay | Admitting: Cardiology

## 2020-05-04 ENCOUNTER — Ambulatory Visit (HOSPITAL_BASED_OUTPATIENT_CLINIC_OR_DEPARTMENT_OTHER)
Admission: RE | Admit: 2020-05-04 | Discharge: 2020-05-04 | Disposition: A | Discharge status: Home / Self Care | Payer: 59 | Source: Home / Self Care | Attending: Cardiology | Admitting: Cardiology

## 2020-05-04 ENCOUNTER — Other Ambulatory Visit: Payer: Self-pay

## 2020-05-04 ENCOUNTER — Ambulatory Visit (HOSPITAL_COMMUNITY): Payer: 59 | Admitting: Anesthesiology

## 2020-05-04 ENCOUNTER — Ambulatory Visit (HOSPITAL_COMMUNITY)
Admission: RE | Admit: 2020-05-04 | Discharge: 2020-05-04 | Disposition: A | Discharge status: Home / Self Care | Payer: 59 | Attending: Cardiology | Admitting: Cardiology

## 2020-05-04 ENCOUNTER — Encounter (HOSPITAL_COMMUNITY)
Admission: RE | Disposition: A | Discharge status: Home / Self Care | Payer: Self-pay | Source: Home / Self Care | Attending: Cardiology

## 2020-05-04 DIAGNOSIS — E785 Hyperlipidemia, unspecified: Secondary | ICD-10-CM | POA: Diagnosis not present

## 2020-05-04 DIAGNOSIS — I340 Nonrheumatic mitral (valve) insufficiency: Secondary | ICD-10-CM | POA: Insufficient documentation

## 2020-05-04 DIAGNOSIS — I10 Essential (primary) hypertension: Secondary | ICD-10-CM | POA: Insufficient documentation

## 2020-05-04 HISTORY — PX: TEE WITHOUT CARDIOVERSION: SHX5443

## 2020-05-04 LAB — ECHO TEE
MV M vel: 4.88 m/s
MV Peak grad: 95.3 mmHg
Radius: 1 cm

## 2020-05-04 SURGERY — ECHOCARDIOGRAM, TRANSESOPHAGEAL
Anesthesia: Monitor Anesthesia Care

## 2020-05-04 MED ORDER — DEXMEDETOMIDINE (PRECEDEX) IN NS 20 MCG/5ML (4 MCG/ML) IV SYRINGE
PREFILLED_SYRINGE | INTRAVENOUS | Status: DC | PRN
  Administered 2020-05-04: 4 ug via INTRAVENOUS

## 2020-05-04 MED ORDER — PROPOFOL 10 MG/ML IV BOLUS
INTRAVENOUS | Status: DC | PRN
  Administered 2020-05-04: 10 mg via INTRAVENOUS
  Administered 2020-05-04 (×2): 20 mg via INTRAVENOUS

## 2020-05-04 MED ORDER — LIDOCAINE 2% (20 MG/ML) 5 ML SYRINGE
INTRAMUSCULAR | Status: DC | PRN
  Administered 2020-05-04: 80 mg via INTRAVENOUS

## 2020-05-04 MED ORDER — SODIUM CHLORIDE 0.9 % IV SOLN: INTRAVENOUS | Status: DC

## 2020-05-04 MED ORDER — PROPOFOL 500 MG/50ML IV EMUL
INTRAVENOUS | Status: DC | PRN
  Administered 2020-05-04: 125 ug/kg/min via INTRAVENOUS

## 2020-05-04 NOTE — Transfer of Care (Signed)
Immediate Anesthesia Transfer of Care Note  Patient: Maven Bankert  Procedure(s) Performed: TRANSESOPHAGEAL ECHOCARDIOGRAM (TEE) (N/A )  Patient Location: Endoscopy Unit  Anesthesia Type:MAC  Level of Consciousness: awake, drowsy, patient cooperative and responds to stimulation  Airway & Oxygen Therapy: Patient Spontanous Breathing and Patient connected to nasal cannula oxygen  Post-op Assessment: Report given to RN, Post -op Vital signs reviewed and stable and Patient moving all extremities X 4  Post vital signs: Reviewed and stable  Last Vitals:  Vitals Value Taken Time  BP    Temp    Pulse 60 05/04/20 1045  Resp 13 05/04/20 1045  SpO2 91 % 05/04/20 1045  Vitals shown include unvalidated device data.  Last Pain:  Vitals:   05/04/20 0908  TempSrc: Temporal  PainSc: 0-No pain         Complications: No complications documented.

## 2020-05-04 NOTE — Discharge Instructions (Signed)

## 2020-05-04 NOTE — H&P (Signed)
33M with HTN, HLD, MR presents for TEE today to evaluate MR.  TTE 03/10/20 showed posterior MVP causing severe MR.  TEE ordered.  He denies any dyspnea or chest pain.  No difficulties swallowing  GEN: Well nourished, well developed, in no acute distress HEENT: normal Neck: no JVD,  Cardiac: RRR; 3/6 systolic murmur loudest at apex Respiratory:  clear to auscultation bilaterally GI: soft, nontender, MS: no deformity or atrophy Skin: warm and dry, no rash Neuro:  Alert and Oriented x 3, Strength and sensation are intact Psych: normal affect  A/P: Proceed with TEE today to evaluate MR.

## 2020-05-04 NOTE — Anesthesia Procedure Notes (Signed)
Procedure Name: MAC Date/Time: 05/04/2020 10:01 AM Performed by: Rande Brunt, CRNA Pre-anesthesia Checklist: Patient identified, Emergency Drugs available, Suction available and Patient being monitored Patient Re-evaluated:Patient Re-evaluated prior to induction Oxygen Delivery Method: Nasal cannula Preoxygenation: Pre-oxygenation with 100% oxygen Induction Type: IV induction Placement Confirmation: positive ETCO2 Dental Injury: Teeth and Oropharynx as per pre-operative assessment

## 2020-05-04 NOTE — Anesthesia Preprocedure Evaluation (Addendum)
Anesthesia Evaluation  Patient identified by MRN, date of birth, ID band Patient awake    Reviewed: Allergy & Precautions, NPO status , Patient's Chart, lab work & pertinent test results  Airway Mallampati: I  TM Distance: >3 FB Neck ROM: Full    Dental  (+) Teeth Intact, Missing, Dental Advisory Given,    Pulmonary former smoker,  Quit smoking 1974   Pulmonary exam normal breath sounds clear to auscultation       Cardiovascular hypertension, Pt. on medications +CHF (grade 2 diastolic dysfunction)  Normal cardiovascular exam+ Valvular Problems/Murmurs (severe MR) MR  Rhythm:Regular Rate:Normal  TTE 03/2020: 1. Left ventricular ejection fraction, by estimation, is 60 to 65%. The  left ventricle has normal function. The left ventricle has no regional  wall motion abnormalities. There is mild left ventricular hypertrophy.  Left ventricular diastolic parameters  are consistent with Grade II diastolic dysfunction (pseudonormalization).  Elevated left ventricular end-diastolic pressure.  2. Right ventricular systolic function is normal. The right ventricular  size is normal.  3. Left atrial size was mildly dilated.  4. 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  Wraps atria into PV;s   Elevated diatolic E velocity and increased EDP also suggest severe  regurgitation Suggest TEE if clnically indicated . The mitral valve is  myxomatous. Severe mitral valve regurgitation. No evidence of mitral  stenosis.  5. The aortic valve is tricuspid. Aortic valve regurgitation is not  visualized. Mild aortic valve sclerosis is present, with no evidence of  aortic valve stenosis.  6. The inferior vena cava is normal in size with greater than 50%  respiratory variability, suggesting right atrial pressure of 3 mmHg.   Stress test 2017:  Nuclear stress EF: 60%.  Blood pressure demonstrated a hypertensive response  to exercise.  There was no ST segment deviation noted during stress.  The study is normal.  This is a low risk study.  The left ventricular ejection fraction is normal (55-65%).     Neuro/Psych negative neurological ROS  negative psych ROS   GI/Hepatic negative GI ROS, Neg liver ROS,   Endo/Other  Hypothyroidism   Renal/GU negative Renal ROS  negative genitourinary   Musculoskeletal  (+) Arthritis , Osteoarthritis,    Abdominal   Peds  Hematology negative hematology ROS (+)   Anesthesia Other Findings   Reproductive/Obstetrics negative OB ROS                            Anesthesia Physical Anesthesia Plan  ASA: IV  Anesthesia Plan: MAC   Post-op Pain Management:    Induction:   PONV Risk Score and Plan: 2 and Propofol infusion and TIVA  Airway Management Planned: Natural Airway and Simple Face Mask  Additional Equipment: None  Intra-op Plan:   Post-operative Plan:   Informed Consent: I have reviewed the patients History and Physical, chart, labs and discussed the procedure including the risks, benefits and alternatives for the proposed anesthesia with the patient or authorized representative who has indicated his/her understanding and acceptance.       Plan Discussed with: CRNA  Anesthesia Plan Comments:        Anesthesia Quick Evaluation

## 2020-05-04 NOTE — Anesthesia Postprocedure Evaluation (Signed)
Anesthesia Post Note  Patient: Chad Berry  Procedure(s) Performed: TRANSESOPHAGEAL ECHOCARDIOGRAM (TEE) (N/A )     Patient location during evaluation: PACU Anesthesia Type: MAC Level of consciousness: awake and alert Pain management: pain level controlled Vital Signs Assessment: post-procedure vital signs reviewed and stable Respiratory status: spontaneous breathing, nonlabored ventilation and respiratory function stable Cardiovascular status: blood pressure returned to baseline and stable Postop Assessment: no apparent nausea or vomiting Anesthetic complications: no   No complications documented.  Last Vitals:  Vitals:   05/04/20 1055 05/04/20 1105  BP: 119/62 134/60  Pulse: (!) 55 (!) 55  Resp: 13 11  Temp:    SpO2: 97% 95%    Last Pain:  Vitals:   05/04/20 1105  TempSrc:   PainSc: 0-No pain                 Pervis Hocking

## 2020-05-04 NOTE — CV Procedure (Signed)
     TRANSESOPHAGEAL ECHOCARDIOGRAM   NAME:  Chad Berry   MRN: [id number] DOB:  [birth year]   ADMIT DATE: 05/04/2020  INDICATIONS: Mitral regurgitation  PROCEDURE:   Informed consent was obtained prior to the procedure. The risks, benefits and alternatives for the procedure were discussed and the patient comprehended these risks.  Risks include, but are not limited to, cough, sore throat, vomiting, nausea, somnolence, esophageal and stomach trauma or perforation, bleeding, low blood pressure, aspiration, pneumonia, infection, trauma to the teeth and death.    After a procedural time-out, the oropharynx was anesthetized and the patient was sedated by the anesthesia service. The transesophageal probe was inserted in the esophagus and stomach without difficulty and multiple views were obtained. Anesthesia was monitored by Ulla Potash, CRNA.    COMPLICATIONS:    There were no immediate complications.  FINDINGS:  Flail P2 segment of mitral valve, severe MR.  Epifanio Lesches MD [facility]  [address] [place], [place] [address] [phone]   3:40 PM

## 2020-05-10 ENCOUNTER — Ambulatory Visit
Admission: RE | Admit: 2020-05-10 | Discharge: 2020-05-10 | Disposition: A | Discharge status: Home / Self Care | Payer: 59 | Source: Ambulatory Visit | Attending: Thoracic Surgery (Cardiothoracic Vascular Surgery) | Admitting: Thoracic Surgery (Cardiothoracic Vascular Surgery)

## 2020-05-10 ENCOUNTER — Other Ambulatory Visit: Payer: Self-pay | Admitting: Thoracic Surgery (Cardiothoracic Vascular Surgery)

## 2020-05-10 ENCOUNTER — Institutional Professional Consult (permissible substitution) (INDEPENDENT_AMBULATORY_CARE_PROVIDER_SITE_OTHER): Payer: 59 | Admitting: Thoracic Surgery (Cardiothoracic Vascular Surgery)

## 2020-05-10 ENCOUNTER — Other Ambulatory Visit: Payer: Self-pay

## 2020-05-10 ENCOUNTER — Encounter: Payer: Self-pay | Admitting: Thoracic Surgery (Cardiothoracic Vascular Surgery)

## 2020-05-10 ENCOUNTER — Telehealth: Payer: Self-pay

## 2020-05-10 VITALS — BP 154/90 | HR 55 | Resp 20 | Ht 71.0 in | Wt 223.0 lb

## 2020-05-10 DIAGNOSIS — I341 Nonrheumatic mitral (valve) prolapse: Secondary | ICD-10-CM | POA: Diagnosis not present

## 2020-05-10 DIAGNOSIS — I340 Nonrheumatic mitral (valve) insufficiency: Secondary | ICD-10-CM | POA: Diagnosis not present

## 2020-05-10 DIAGNOSIS — I7100 Dissection of unspecified site of aorta: Secondary | ICD-10-CM

## 2020-05-10 MED ORDER — PREDNISONE 50 MG PO TABS: ORAL_TABLET | ORAL | 0 refills | Status: DC

## 2020-05-10 NOTE — Patient Instructions (Addendum)
Continue all previous medications without any changes at this time  

## 2020-05-10 NOTE — Progress Notes (Signed)
[address]       [name] [address]             [phone]     CARDIOTHORACIC SURGERY CONSULTATION REPORT  Referring Provider is Runell Gess, MD PCP is Mosetta Putt, MD  Chief Complaint  Patient presents with  . Mitral Regurgitation    Initial surgical consult, TEE 1/4, ECHO 11/10    HPI:  Patient is 80 year old moderately overweight male with history of mitral valve prolapse, mitral regurgitation, hypertension, hyperlipidemia, hypothyroidism, and asymptomatic cerebrovascular disease who has been referred for surgical consultation to discuss treatment options for management of mitral valve prolapse with severe mitral regurgitation.  Patient has known of presence of heart murmur and mitral valve prolapse for more than 20 years.  He has been followed up for many years by Dr. Allyson Sabal.  Follow-up echocardiogram performed December 2020 revealed normal left ventricular systolic function with mitral valve prolapse and what was felt to be moderate mitral regurgitation.  The patient is always remained active physically and without significant physical limitations.  Recent echocardiogram performed March 10, 2020 revealed normal left ventricular size and systolic function with mitral valve prolapse and severe mitral regurgitation.  There was mild left atrial enlargement.  The patient subsequently underwent transesophageal echocardiogram May 04, 2020.  TEE revealed mitral valve prolapse with an obvious flail segment involving the middle scallop (P2) of the posterior leaflet causing severe mitral regurgitation.  Left ventricular size and systolic function remain normal.  There was mild left atrial enlargement.  No other significant abnormalities were noted.  Cardiothoracic surgical consultation was requested.  Patient is married and lives locally [place] farm near [place].  He has worked in a [employer] located in [place] for [employer] for many years.  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  He has remained physically active and entirely functionally independent in all of his life.  He continues to race automobiles, including his recently purchased Corvette.  Patient states that he has had occasional brief episodes of fleeting chest pain of varying intensity and severity off and on for over 20 years.  These have been associated with occasional palpitations which seem to be increasing in frequency recently.  He denies any exertional shortness of breath, resting shortness of breath, PND, orthopnea, or lower extremity edema.  He has not had any dizzy spells, nor syncope.  He has been fully vaccinated against the COVID-19 vaccine using 2 shots from the [facility] vaccine.  He has not received a booster.  To his knowledge he has never had COVID 19 infection.  He does complain that for the last several months he has had problems with intermittent headaches and sinus congestion.  He was recently diagnosed with sinusitis and has been recommended by his primary care physician that he see an otolaryngologist.   Past Medical History:  Diagnosis Date  . Aortic atherosclerosis (HCC)   . Bradycardia    while on BB with HR to the 40s  . Carotid stenosis    CAROTID DOPPLER, 12/07/2011 - Mild arthrosclerotic changes, no high-grade stenosis  . Cataract    rt. eye  . Clostridium difficile infection 05/2019  . Diverticulitis   . Heart murmur   . History of echocardiogram    Echo 9/16:  EF 60-65%, no RWMA, Gr 1 DD, holosystolic MVP of posterior leaflet, mild MR, LA upper limits of  normal, normal RVF, mod TR, PASP 50 mmHg  . Hypercholesteremia   . Hyperlipidemia   . Hypertension   . Hypothyroidism   . MVP (mitral valve prolapse)    a. Echo 12/07/2011 - EF-65-70%, severe mitral regurgitation, moderate-severe tricuspid regurgitation, moderate pulmonary HTN, moderate pulmonic regurgitation;  b.  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 Prolapsed internal hemorrhoids, grade 3 02/14/2017  . PVC (premature ventricular contraction)   . Rapid palpitations    event monitor with short bursts of SVT  . Renal cyst   . Testicular hypofunction   . Vitamin D deficiency     Past Surgical History:  Procedure Laterality Date  . COLONOSCOPY    . SIGMOIDOSCOPY  02/14/2017  . TEE WITHOUT CARDIOVERSION N/A 05/04/2020   Procedure: TRANSESOPHAGEAL ECHOCARDIOGRAM (TEE);  Surgeon: Little Ishikawa, MD;  Location: [facility] ENDOSCOPY;  Service: Cardiovascular;  Laterality: N/A;    Family History  Problem Relation Age of Onset  . Stroke Mother 36  . Heart failure Father   . Diverticulitis Sister   . Stroke Brother 17  . Heart attack Brother   . Colon cancer Neg Hx   . Esophageal cancer Neg Hx   . Stomach cancer Neg Hx   . Rectal cancer Neg Hx     Social History   Socioeconomic History  . Marital status: Married    Spouse name: Not on file  . Number of children: Not on file  . Years of education: Not on file  . Highest education level: Not on file  Occupational History  . Not on file  Tobacco Use  . Smoking status: Former Smoker    Quit date: 12/23/1972    Years since quitting: 47.4  . Smokeless tobacco: Former Neurosurgeon    Types: Engineer, drilling  . Vaping Use: Never used  Substance and Sexual Activity  . Alcohol use: Yes    Alcohol/week: 0.0 standard drinks    Comment: occasionally  . Drug use: No  . Sexual activity: Not on file  Other Topics Concern  . Not on file  Social History Narrative   Married, owner of Facilities manager company   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 [place]   Second home [place], enjoys fihing, cars (Has ZR1)   Social Determinants of Health   Financial Resource Strain: Not on file  Food  Insecurity: Not on file  Transportation Needs: Not on file  Physical Activity: Not on file  Stress: Not on file  Social Connections: Not on file  Intimate Partner Violence: Not on file    Current Outpatient Medications  Medication Sig Dispense Refill  . cholecalciferol (VITAMIN D3) 25 MCG (1000 UNIT) tablet Take 2,000 Units by mouth daily.    [name] ezetimibe (ZETIA) 10 MG tablet Take 10 mg by mouth at bedtime.    [name] ibuprofen (ADVIL) 200 MG tablet Take 400 mg by mouth every 6 (six) hours as needed for headache (Sinus).    [name] levofloxacin (LEVAQUIN) 500 MG tablet Take 500 mg by mouth daily.    [name] levothyroxine (SYNTHROID) 75 MCG tablet Take 75 mcg by mouth at bedtime.    [name] losartan (COZAAR) 100 MG tablet Take 1 tablet (100 mg total) by mouth daily. 90 tablet 3  . predniSONE (DELTASONE) 50 MG tablet Take 50mg  prednisone on 05/10/20 @ 7pm.  Take 50mg  prednisone on 05/11/20 @ 1am.  Take 50mg  prednisone on 05/11/20 @ 7am AND 50mg  benadryl. 3 tablet 0  . rosuvastatin (CRESTOR) 40 MG tablet Take 40 mg by mouth at bedtime.     No current facility-administered medications for this visit.    Allergies  Allergen Reactions  . Contrast Media [Iodinated Diagnostic Agents] Hives, Nausea And Vomiting and Cough    Pt will need a 13 hour prep before any contrast per Dr. Paulina Fusi (05/14/19)  . Hctz [Hydrochlorothiazide] Other (See Comments)    palpitations  . Metoprolol Succinate [Metoprolol] Other (See Comments)    Bradycardia        Review of Systems:   General:  normal appetite, normal energy, no weight gain, no weight loss, no fever  Cardiac:  no chest pain with exertion, no chest pain at rest, no SOB with exertion, no resting SOB, no PND, no orthopnea, occasional palpitations, no arrhythmia, no atrial fibrillation, no LE edema, no dizzy spells, no syncope  Respiratory:  no shortness of breath, no home oxygen, no productive cough, no dry cough, no bronchitis, no wheezing, no hemoptysis, no asthma,  no pain with inspiration or cough, no sleep apnea, no CPAP at night  GI:   no difficulty swallowing, no reflux, no frequent heartburn, no hiatal hernia, no abdominal pain, no constipation, no diarrhea, no hematochezia, no hematemesis, no melena  GU:   no dysuria,  no frequency, no urinary tract infection, no hematuria, no enlarged prostate, no kidney stones, no kidney disease  Vascular:  no pain suggestive of claudication, no pain in feet, no leg cramps, no varicose veins, no DVT, no non-healing foot ulcer  Neuro:   no stroke, no TIA's, no seizures, no headaches, no temporary blindness one eye,  no slurred speech, no peripheral neuropathy, no chronic pain, no instability of gait, no memory/cognitive dysfunction  Musculoskeletal: no arthritis, no joint swelling, no myalgias, no difficulty walking, normal mobility   Skin:   no rash, no itching, no skin infections, no pressure sores or ulcerations  Psych:   no anxiety, no depression, no nervousness, no unusual recent stress  Eyes:   no blurry vision, no floaters, no recent vision changes, no wears glasses or contacts  ENT:   no hearing loss, no loose or painful teeth, no dentures, last saw dentist 2 years ago, + chronic problems with sinus headaches, congestion  Hematologic:  no easy bruising, no abnormal bleeding, no clotting disorder, no frequent epistaxis  Endocrine:  no diabetes, does not check CBG's at home     Physical Exam:   BP (!) 154/90 (BP Location: Right Arm, Patient Position: Sitting)   Pulse (!) 55   Resp 20   Ht 5\' 11"  (1.803 m)   Wt 223 lb (101.2 kg)   SpO2 95% Comment: RA with mask on  BMI 31.10 kg/m   General:    well-appearing  HEENT:  Unremarkable   Neck:   no JVD, no bruits, no adenopathy   Chest:   clear to auscultation, symmetrical breath sounds, no wheezes, no rhonchi   CV:   RRR, grade IV/VI holosystolic murmur   Abdomen:  soft, non-tender, no masses   Extremities:  warm, well-perfused, pulses palpabld, no LE  edema  Rectal/GU  Deferred  Neuro:   Grossly non-focal and symmetrical throughout  Skin:   Clean and dry, no rashes, no breakdown   Diagnostic Tests:   ECHOCARDIOGRAM REPORT       Patient Name:  [facility]  Date of Exam: 03/10/2020  Medical Rec #: [id number]   Height:    69.0 in  Accession #:  [id number]  Weight:    214.6 lb  Date of Birth: Apr 28, 1941   BSA:     2.129 m  Patient Age:  79 years   BP:      130/72 mmHg  Patient Gender: M       HR:      61 bpm.  Exam Location: [address]   Procedure: 2D Echo, Cardiac Doppler and Color Doppler   Indications:  I34.0    History:    Patient has prior history of Echocardiogram examinations,  most         recent 04/01/2019. MR and Mitral Valve Prolapse,         Arrythmias:Bradycardia; Risk Factors:Hypertension,  Dyslipidemia         and Former Smoker.    Sonographer:  Samule OhmWilliam Edwards RDCS  Referring Phys: [phone] JONATHAN J BERRY   IMPRESSIONS    1. Left ventricular ejection fraction, by estimation, is 60 to 65%. The  left ventricle has normal function. The left ventricle has no regional  wall motion abnormalities. There is mild left ventricular hypertrophy.  Left ventricular diastolic parameters  are consistent with Grade II diastolic dysfunction (pseudonormalization).  Elevated left ventricular end-diastolic pressure.  2. Right ventricular systolic function is normal. The right ventricular  size is normal.  3. Left atrial size was mildly dilated.  4. 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  Wraps atria into PV;s   Elevated diatolic E velocity and increased EDP also suggest severe  regurgitation Suggest TEE if clnically indicated . The mitral valve is  myxomatous. Severe mitral valve regurgitation. No evidence of mitral  stenosis.  5. The aortic valve is tricuspid. Aortic valve regurgitation is not   visualized. Mild aortic valve sclerosis is present, with no evidence of  aortic valve stenosis.  6. The inferior vena cava is normal in size with greater than 50%  respiratory variability, suggesting right atrial pressure of 3 mmHg.   FINDINGS  Left Ventricle: Left ventricular ejection fraction, by estimation, is 60  to 65%. The left ventricle has normal function. The left ventricle has no  regional wall motion abnormalities. The left ventricular internal cavity  size was normal in size. There is  mild left ventricular hypertrophy. Left ventricular diastolic parameters  are consistent with Grade II diastolic dysfunction (pseudonormalization).  Elevated left ventricular end-diastolic pressure.   Right Ventricle: The right ventricular size is normal. No increase in  right ventricular wall thickness. Right ventricular systolic function is  normal.   Left Atrium: Left atrial size was mildly dilated.   Right Atrium: Right atrial size was normal in size.   Pericardium: There is no evidence of pericardial effusion.   Mitral Valve: Posterior leaflet prolapse with eccentric anteriorly  directed MR. Wraps atria into PV;s  Elevated diatolic E velocity and increased EDP also suggest severe  regurgitation Suggest TEE if clnically indicated. The mitral valve is  myxomatous. Severe mitral valve regurgitation. No evidence of mitral valve  stenosis.   Tricuspid Valve: The tricuspid valve is normal in structure. Tricuspid  valve regurgitation is trivial. No evidence of tricuspid stenosis.   Aortic Valve: The aortic valve is tricuspid. Aortic valve regurgitation is  not visualized. Mild aortic valve sclerosis is present, with no evidence  of aortic valve stenosis.   Pulmonic Valve: The pulmonic valve was normal in structure. Pulmonic valve  regurgitation is  trivial. No evidence of pulmonic stenosis.   Aorta: The aortic root is normal in size and structure.   Venous: The inferior vena  cava is normal in size with greater than 50%  respiratory variability, suggesting right atrial pressure of 3 mmHg.   IAS/Shunts: No atrial level shunt detected by color flow Doppler.     LEFT VENTRICLE  PLAX 2D  LVIDd:     5.20 cm Diastology  LVIDs:     3.60 cm LV e' medial:  7.07 cm/s  LV PW:     1.20 cm LV E/e' medial: 20.1  LV IVS:    1.20 cm LV e' lateral:  6.85 cm/s  LVOT diam:   2.30 cm LV E/e' lateral: 20.7  LV SV:     66  LV SV Index:  31  LVOT Area:   4.15 cm               3D Volume EF:             3D EF:    63 %             LV EDV:    211 ml             LV ESV:    78 ml             LV SV:    132 ml   RIGHT VENTRICLE       IVC  RV S prime:   16.80 cm/s IVC diam: 2.50 cm  TAPSE (M-mode): 2.7 cm  RVSP:      43.0 mmHg   LEFT ATRIUM       Index    RIGHT ATRIUM      Index  LA diam:    3.80 cm 1.79 cm/m RA Pressure: 8.00 mmHg  LA Vol (A2C):  84.0 ml 39.46 ml/m RA Area:   21.40 cm  LA Vol (A4C):  90.9 ml 42.70 ml/m RA Volume:  72.00 ml 33.82 ml/m  LA Biplane Vol: 89.6 ml 42.09 ml/m  AORTIC VALVE  LVOT Vmax:  73.10 cm/s  LVOT Vmean: 45.200 cm/s  LVOT VTI:  0.159 m    AORTA  Ao Root diam: 3.95 cm  Ao Asc diam: 3.40 cm   MV E velocity: 142.00 cm/s TRICUSPID VALVE  MV A velocity: 55.80 cm/s  TR Peak grad:  35.0 mmHg  MV E/A ratio: 2.54     TR Vmax:    296.00 cm/s               Estimated RAP: 8.00 mmHg               RVSP:      43.0 mmHg                 SHUNTS               Systemic VTI: 0.16 m               Systemic Diam: 2.30 cm   Charlton HawsPeter Nishan MD  Electronically signed by Charlton HawsPeter Nishan MD  Signature Date/Time: 03/10/2020/9:55:30 AM         TRANSESOPHOGEAL ECHO REPORT      [facility] Date of Exam: 05/04/2020  Medical Rec #: [id number] Height:    71.0 in  Accession #:  [id number] Weight:    222.0 lb  Date of Birth: 27-Jul-1940 BSA:     2.204 m  Patient Age:  79 years  BP:      119/62 mmHg  Patient Gender: M     HR:      66 bpm.  Exam Location: Inpatient   Procedure: Transesophageal Echo, 3D Echo, Color Doppler and Cardiac  Doppler   Indications:   Mitral Regurgitation    History:     Patient has prior history of Echocardiogram examinations,  most          recent 03/10/2020. Risk Factors:Hypertension and  Dyslipidemia.    Sonographer:   Margreta Journey RDCS  Referring Phys: [id number] Tanna Savoy [facility]  Diagnosing Phys: Epifanio Lesches MD   PROCEDURE: After discussion of the risks and benefits of a TEE, an  informed consent was obtained from the patient. The transesophogeal probe  was passed without difficulty through the esophogus of the patient.  Sedation performed by different physician.  The patient was monitored while under deep sedation. Anesthestetic  sedation was provided intravenously by Anesthesiology: 438mg  of Propofol,  80mg  of Lidocaine. The patient developed no complications during the  procedure.   IMPRESSIONS    1. Flail P2 segment of posterior leaflet of mitral valve causing  eccentric anterior directed MR jet. Severe mitral valve regurgitation. 3D  vena contracta area 0.6 cm^2, consistent with severe MR.  2. Left ventricular ejection fraction, by estimation, is 60 to 65%. The  left ventricle has normal function.  3. Right ventricular systolic function is normal. The right ventricular  size is mildly enlarged.  4. Left atrial size was mildly dilated. No left atrial/left atrial  appendage thrombus was detected.  5. The aortic valve is tricuspid. Aortic valve regurgitation is trivial.  No aortic stenosis is present.  6. Aortic dilatation noted. There is mild  dilatation of the ascending  aorta, measuring 38 mm.   FINDINGS  Left Ventricle: Left ventricular ejection fraction, by estimation, is 60  to 65%. The left ventricle has normal function. The left ventricular  internal cavity size was normal in size.   Right Ventricle: The right ventricular size is mildly enlarged. No  increase in right ventricular wall thickness. Right ventricular systolic  function is normal.   Left Atrium: Left atrial size was mildly dilated. No left atrial/left  atrial appendage thrombus was detected.   Right Atrium: Right atrial size was normal in size.   Pericardium: There is no evidence of pericardial effusion.   Mitral Valve: The mitral valve is abnormal. Severe mitral valve  regurgitation.   Tricuspid Valve: The tricuspid valve is normal in structure. Tricuspid  valve regurgitation is trivial.   Aortic Valve: The aortic valve is tricuspid. Aortic valve regurgitation is  trivial. No aortic stenosis is present.   Pulmonic Valve: The pulmonic valve was grossly normal. Pulmonic valve  regurgitation is mild.   Aorta: Aortic dilatation noted. There is mild dilatation of the ascending  aorta, measuring 38 mm.   IAS/Shunts: No atrial level shunt detected by color flow Doppler.     MR Peak grad:  95.3 mmHg  MR Mean grad:  65.0 mmHg  MR Vmax:     488.00 cm/s  MR Vmean:    383.0 cm/s  MR PISA:     6.28 cm  MR PISA Eff ROA: 50 mm  MR PISA Radius: 1.00 cm   MD  Electronically signed by MD  Signature Date/Time: 05/04/2020/3:50:15 PM      Impression:  Patient has mitral valve prolapse with stage C1 severe asymptomatic primary mitral regurgitation.  At present  he denies any significant symptoms of exertional shortness of breath or changes in his exercise tolerance.  He does report occasional palpitations that have been increasing in frequency and he has had sporadic brief episodes of atypical  chest pain off and on for many years.  I have personally reviewed the patient's recent transthoracic and transesophageal echocardiograms.  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  There is an eccentric jet of regurgitation.  Quantification of mitral regurgitation using PISA confirmed findings consistent with severe mitral regurgitation.  Left ventricular size and systolic function appears normal.  There is mild left atrial enlargement.  No other significant abnormalities are noted.  Options at this time include continued close follow-up on medical therapy versus elective mitral valve repair.  Based upon review of the patient's transesophageal echocardiogram I would anticipate greater than 95% likelihood of successful and durable mitral valve repair with low operative risks.  The patient has been scheduled for coronary CT angiogram to screen for the presence of significant coronary artery disease.  In the absence of significant coronary artery disease he may be a reasonably good candidate for for minimally invasive approach for surgery.   Plan:  The patient was counseled at length regarding his diagnosis of severe primary mitral regurgitation.  We reviewed the results of their diagnostic tests including images from the most recent echocardiogram.  We discussed the natural history of mitral regurgitation as well as alternative treatment strategies.  We discussed the impact of his age, current state of health, and any significant comorbid medical problems on clinical decision making.  We went on to discuss the indications, risks and potential benefits of mitral valve repair as well as the timing of surgical intervention.  The rationale for elective surgery has been explained, including a comparison between surgery and continued medical therapy with close follow-up.  The  likelihood of successful and durable mitral valve repair has been discussed with particular reference to the findings of the most recent echocardiogram.  Based upon these findings and previous experience, I have quoted a greater than 98 percent likelihood of successful valve repair with less than 1 percent risk of mortality or major morbidity.  Alternative surgical approaches have been discussed including a comparison between conventional sternotomy and minimally-invasive techniques.  The relative risks and benefits of each have been reviewed as they pertain to the patient's specific circumstances, and expectations for the patient's postoperative convalescence has been discussed.  The patient is interested in proceeding with elective mitral valve repair in the near future.  We await results from his planned coronary CT angiogram to see whether or not diagnostic cardiac catheterization would be recommended prior to proceeding with surgery.  I have suggested that the patient should go ahead and see an otolaryngologist as recommended by his primary care physician to make sure that he does not have ongoing infection related to his sinusitis.  We also discussed the impact of the ongoing surgeon COVID-19 infections.  All of his questions have been addressed.  The patient will return to our office in approximately 2 weeks.  All questions have been answered.   I spent in excess of 90 minutes during the conduct of this office consultation and >50% of this time involved direct face-to-face encounter with the patient for counseling and/or coordination of their care.    Salvatore Decent. Cornelius Moras, MD 05/10/2020 12:55 PM

## 2020-05-10 NOTE — Telephone Encounter (Signed)
13 hr prep e-scribed.  Instructions will be on prescription bottle.

## 2020-05-11 ENCOUNTER — Ambulatory Visit
Admission: RE | Admit: 2020-05-11 | Discharge: 2020-05-11 | Disposition: A | Discharge status: Home / Self Care | Payer: 59 | Source: Ambulatory Visit | Attending: Thoracic Surgery (Cardiothoracic Vascular Surgery) | Admitting: Thoracic Surgery (Cardiothoracic Vascular Surgery)

## 2020-05-11 DIAGNOSIS — I7100 Dissection of unspecified site of aorta: Secondary | ICD-10-CM

## 2020-05-11 MED ORDER — IOPAMIDOL (ISOVUE-370) INJECTION 76%
75.0000 mL | Freq: Once | INTRAVENOUS | Status: AC | PRN
  Administered 2020-05-11: 75 mL via INTRAVENOUS

## 2020-05-24 ENCOUNTER — Telehealth: Payer: Self-pay | Admitting: Cardiovascular Disease

## 2020-05-24 ENCOUNTER — Encounter: Payer: 59 | Admitting: Thoracic Surgery (Cardiothoracic Vascular Surgery)

## 2020-05-24 NOTE — Telephone Encounter (Signed)
Patient states that he was supposed to have an appointment this morning with Dr. Cornelius Moras but when he got to the office it was cancelled. Patient states that he was told by the front desk that he needs to follow up with Dr. Allyson Sabal about a potential heart cath due to recent results of CTA. Patient states that he does not know any results and was unaware that he needed to follow up with Dr. Allyson Sabal. Patient states that he had previously seen Dr. Cornelius Moras in consultation for a potential valve replacement. Patient also wanted to let Dr. Allyson Sabal know that he recently found out that he has an issue with his sinuses which he is getting treated in another health system and that it may require surgery. Patient states that he would like to know what his next steps are and if he does in fact need to follow up with Dr. Allyson Sabal. Patient also stated that at this time he is trying to avoid the hospital for any procedures due to Covid.  Advised patient that I would forward message to Dr. Allyson Sabal to review and advise.

## 2020-05-24 NOTE — Telephone Encounter (Signed)
Chad Berry was going to set up a R/L heart cath to eval pt's MR and for pre op eval. Per  Dr. Cornelius Moras

## 2020-05-24 NOTE — Telephone Encounter (Signed)
    Pt would like to speak to Dr. Hazle Coca nurse regarding his appt with Dr. Cornelius Moras and results

## 2020-05-26 ENCOUNTER — Telehealth: Payer: Self-pay

## 2020-05-27 ENCOUNTER — Other Ambulatory Visit: Payer: Self-pay

## 2020-05-27 ENCOUNTER — Telehealth: Payer: Self-pay | Admitting: Cardiovascular Disease

## 2020-05-27 ENCOUNTER — Ambulatory Visit (INDEPENDENT_AMBULATORY_CARE_PROVIDER_SITE_OTHER): Payer: 59 | Admitting: Otolaryngology

## 2020-05-27 VITALS — Temp 97.9°F

## 2020-05-27 DIAGNOSIS — J323 Chronic sphenoidal sinusitis: Secondary | ICD-10-CM

## 2020-05-27 NOTE — Telephone Encounter (Signed)
Patient is returning [place] call. He says he has tried to get in touch with her multiple times today, he states that he had a test a few weeks ago and would like to discuss the results with any nurse on Dr. Hazle Coca team. Please advise.

## 2020-05-27 NOTE — Progress Notes (Addendum)
HPI: Chad Berry is a 80 y.o. male who presents is referred by his PCP Dr. 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  Patient apparently has been having on and off headaches over the past 2 years as well as occasional bad smell in his nose.  More recently over the past 7 months when he bends over he gets a headache more on top of the head although sometimes has been in the temple area.  He has been on Cipro in the past but this resulted in C. difficile GI infection.  He does have apparent diverticulosis.  He has been treated C. difficile infection with vancomycin and is doing better concerning this.  Most recently following a recent CT scan he was treated with Levaquin 500 mg daily and has had no problems with this medication. I reviewed the recent CT scan with the patient in the office today and this showed mostly clear paranasal sinuses although he has opacification of the left sphenoid sinus with very thickened bone surrounding the sinus consistent with osteitis and also consistent with a prolonged left sphenoid sinus infection.  The right sphenoid sinus is clear.  He has a history of an old nasal fracture with septal deviation to the right. In addition to the above he has history of mitral valve regurgitation and is being evaluated by cardiac surgery Dr. Cornelius Moras concerning possible surgical intervention for this although he is having minimal symptoms related to the mitral valve regurgitation.  He apparently recently underwent a cardiac catheterization but patient does not know the results of this..  Denies any history of MI or angina.  He is on no blood thinners.  Past Medical History:  Diagnosis Date  . Aortic atherosclerosis (HCC)   . Bradycardia    while on BB with HR to the 40s  . Carotid stenosis    CAROTID DOPPLER, 12/07/2011 - Mild arthrosclerotic changes, no high-grade stenosis  . Cataract    rt. eye  . Clostridium difficile infection 05/2019  . Diverticulitis   .  Heart murmur   . History of echocardiogram    Echo 9/16:  EF 60-65%, no RWMA, Gr 1 DD, holosystolic MVP of posterior leaflet, mild MR, LA upper limits of normal, normal RVF, mod TR, PASP 50 mmHg  . Hypercholesteremia   . Hyperlipidemia   . Hypertension   . Hypothyroidism   . MVP (mitral valve prolapse)    a. Echo 12/07/2011 - EF-65-70%, severe mitral regurgitation, moderate-severe tricuspid regurgitation, moderate pulmonary HTN, moderate pulmonic regurgitation;  b. 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 Prolapsed internal hemorrhoids, grade 3 02/14/2017  . PVC (premature ventricular contraction)   . Rapid palpitations    event monitor with short bursts of SVT  . Renal cyst   . Testicular hypofunction   . Vitamin D deficiency    Past Surgical History:  Procedure Laterality Date  . COLONOSCOPY    . SIGMOIDOSCOPY  02/14/2017  . TEE WITHOUT CARDIOVERSION N/A 05/04/2020   Procedure: TRANSESOPHAGEAL ECHOCARDIOGRAM (TEE);  Surgeon: Little Ishikawa, MD;  Location: [facility] ENDOSCOPY;  Service: Cardiovascular;  Laterality: N/A;   Social History   Socioeconomic History  . Marital status: Married    Spouse name: Not on file  . Number of children: Not on file  . Years of education: Not on  file  . Highest education level: Not on file  Occupational History  . Not on file  Tobacco Use  . Smoking status: Former Smoker    Quit date: 12/23/1972    Years since quitting: 47.4  . Smokeless tobacco: Former Neurosurgeon    Types: Engineer, drilling  . Vaping Use: Never used  Substance and Sexual Activity  . Alcohol use: Yes    Alcohol/week: 0.0 standard drinks    Comment: occasionally  . Drug use: No  . Sexual activity: Not on file  Other Topics Concern  . 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 Stroke Mother 18  . Heart failure Father   . Diverticulitis Sister   . Stroke Brother 14  . Heart attack Brother   . Colon cancer Neg Hx   . Esophageal cancer Neg Hx   . Stomach cancer Neg Hx   . Rectal cancer Neg Hx    Allergies  Allergen Reactions  . Contrast Media [Iodinated Diagnostic Agents] Hives, Nausea And Vomiting and Cough    Pt will need a 13 hour prep before any contrast per Dr. Paulina Fusi (05/14/19)  . Hctz [Hydrochlorothiazide] Other (See Comments)    palpitations  . Metoprolol Succinate [Metoprolol] Other (See Comments)    Bradycardia     Prior to Admission medications   Medication Sig Start Date End Date Taking? Authorizing Provider  cholecalciferol (VITAMIN D3) 25 MCG (1000 UNIT) tablet Take 2,000 Units by mouth daily.    [provider]  ezetimibe (ZETIA) 10 MG tablet Take 10 mg by mouth at bedtime.    [provider]  ibuprofen (ADVIL) 200 MG tablet Take 400 mg by mouth every 6 (six) hours as needed for headache (Sinus).    [provider]  levofloxacin (LEVAQUIN) 500 MG tablet Take 500 mg by mouth daily.    [provider]  levothyroxine (SYNTHROID) 75 MCG tablet Take 75 mcg by mouth at bedtime.    [provider]  losartan (COZAAR) 100 MG tablet Take 1 tablet (100 mg total) by mouth daily. 06/09/15   Azalee Course, PA  predniSONE (DELTASONE) 50 MG tablet Take 50mg  prednisone on 05/10/20 @ 7pm.  Take 50mg  prednisone on 05/11/20 @ 1am.  Take 50mg  prednisone on 05/11/20 @ 7am AND 50mg  benadryl. 05/10/20   , MD  rosuvastatin  (CRESTOR) 40 MG tablet Take 40 mg by mouth at bedtime.    [provider]     Positive ROS: Otherwise negative  All other systems have been reviewed and were otherwise negative with the exception of those mentioned in the HPI and as above.  Physical Exam: Constitutional: Alert, well-appearing, no acute distress.  Patient is not having a headache today nor does he have a headache when he bends over.  He has had some odor in his nose but no significant drainage from the nose. Ears: External ears without lesions or tenderness. Ear canals are clear bilaterally with intact, clear TMs.  Nasal: External nose without lesions. Septum is  deviated to the right..  On intranasal exam there are no polyps.  Both middle meatus appeared clear with no purulent discharge.  Posterior nasal cavity was clear. Oral: Lips and gums without lesions. Tongue and palate mucosa without lesions. Posterior oropharynx clear.  No obvious purulent discharge in the posterior oropharynx. Neck: No palpable adenopathy or masses Respiratory: Breathing comfortably  Skin: No facial/neck lesions or rash noted.  Procedures  Assessment: Chronic left sphenoid sinusitis.  Plan: Discussed with patient today that this appears to be chronic and most likely will require surgical intervention in order to open up the sinus adequately. I initially will treat him with Augmentin 875 mg 3 times daily for 3 weeks as well as regular use of nasal steroid spray Nasacort 2 sprays at night to try to reduce any inflammatory changes.  We will plan on obtaining a CT scan of the sinuses fusion protocol in 4 weeks as this will be needed if we do any surgical intervention.  If he does not have any improvement in the sinus on follow-up CT scan I would recommend surgical intervention.  We will also have to review this with any plans for treatment of his mitral regurgitation. Patient will follow up following the repeat CT scan of the sinuses in 4  weeks.   Narda Bonds, MD   CC:

## 2020-05-27 NOTE — Telephone Encounter (Signed)
Kijana is returning [employer]. Please advise.

## 2020-05-27 NOTE — Telephone Encounter (Signed)
Spoke with patient who states that he has been waiting to hear back regarding recent CT Results as well as making an appointment to see Dr. Allyson Sabal to discuss next steps and discuss results.  Scheduled patient an appointment with Dr. Allyson Sabal to be seen in the office tomorrow 05/28/20 at 11:45am.

## 2020-05-28 ENCOUNTER — Ambulatory Visit (INDEPENDENT_AMBULATORY_CARE_PROVIDER_SITE_OTHER): Payer: 59 | Admitting: Cardiovascular Disease

## 2020-05-28 ENCOUNTER — Encounter: Payer: Self-pay | Admitting: Cardiovascular Disease

## 2020-05-28 ENCOUNTER — Other Ambulatory Visit: Payer: Self-pay

## 2020-05-28 VITALS — BP 132/74 | HR 54 | Ht 69.0 in | Wt 224.0 lb

## 2020-05-28 DIAGNOSIS — R002 Palpitations: Secondary | ICD-10-CM | POA: Diagnosis not present

## 2020-05-28 DIAGNOSIS — I10 Essential (primary) hypertension: Secondary | ICD-10-CM

## 2020-05-28 DIAGNOSIS — E782 Mixed hyperlipidemia: Secondary | ICD-10-CM

## 2020-05-28 NOTE — Patient Instructions (Signed)
Medication Instructions:  Your physician recommends that you continue on your current medications as directed. Please refer to the Current Medication list given to you today.  *If you need a refill on your cardiac medications before your next appointment, please call your pharmacy*   Follow-Up: At [facility], you and your health needs are our priority.  As part of our continuing mission to provide you with exceptional heart care, we have created designated Provider Care Teams.  These Care Teams include your primary Cardiologist (physician) and Advanced Practice Providers (APPs -  Physician Assistants and Nurse Practitioners) who all work together to provide you with the care you need, when you need it.  We recommend signing up for the patient portal called "MyChart".  Sign up information is provided on this After Visit Summary.  MyChart is used to connect with patients for Virtual Visits (Telemedicine).  Patients are able to view lab/test results, encounter notes, upcoming appointments, etc.  Non-urgent messages can be sent to your provider as well.   To learn more about what you can do with MyChart, go to [email or web address].    Your next appointment:   3 month(s)  The format for your next appointment:   In Person  Provider:   Jonathan Berry, MD 

## 2020-05-28 NOTE — Progress Notes (Signed)
Mr. Gallick returns today for follow-up.  He did see Dr. Cornelius Moras in my request 05/10/2020.  He had a transesophageal echo performed by Dr. Bjorn Pippin 05/04/2020 revealing a flail P2 segment with severe MR.  He apparently has a sinus infection which is being treated by an ENT doctor with antibiotics and nasal flushes potentially requiring surgery all of which should be done before any mitral valve procedure.  He wishes to delay this for several months.  I will see him back in 3 months at which time we will discuss doing a right left heart cath and pursuing his mitral valve surgery with Dr. Cornelius Moras.  Runell Gess, M.D., FACP, [facility], [facility] [address] [place], [place]  [address]  [phone] 05/28/2020 12:33 PM

## 2020-06-03 ENCOUNTER — Other Ambulatory Visit (INDEPENDENT_AMBULATORY_CARE_PROVIDER_SITE_OTHER): Payer: Self-pay

## 2020-06-03 DIAGNOSIS — J323 Chronic sphenoidal sinusitis: Secondary | ICD-10-CM

## 2020-06-28 ENCOUNTER — Ambulatory Visit
Admission: RE | Admit: 2020-06-28 | Discharge: 2020-06-28 | Disposition: A | Discharge status: Home / Self Care | Payer: 59 | Source: Ambulatory Visit | Attending: Otolaryngology | Admitting: Otolaryngology

## 2020-06-28 ENCOUNTER — Other Ambulatory Visit: Payer: Self-pay

## 2020-06-28 DIAGNOSIS — J323 Chronic sphenoidal sinusitis: Secondary | ICD-10-CM

## 2020-07-05 ENCOUNTER — Other Ambulatory Visit: Payer: Self-pay

## 2020-07-05 ENCOUNTER — Ambulatory Visit (INDEPENDENT_AMBULATORY_CARE_PROVIDER_SITE_OTHER): Payer: 59 | Admitting: Otolaryngology

## 2020-07-05 VITALS — Temp 97.3°F

## 2020-07-05 DIAGNOSIS — J323 Chronic sphenoidal sinusitis: Secondary | ICD-10-CM | POA: Diagnosis not present

## 2020-07-05 NOTE — Progress Notes (Signed)
HPI: Chad Berry is a 80 y.o. male who returns today for evaluation of chronic left sphenoid sinusitis.  He has continued to have headaches and feels poorly.  He does have history of mitral valve prolapse that apparently will require surgery at some point.  But they wanted his sinus infection cleared up first.  On review of his recent sinus CT scan this showed findings consistent with chronic left sphenoid sinusitis with very thickened sphenoid bone.  The right sphenoid sinus is clear.  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  Past Medical History:  Diagnosis Date  . Aortic atherosclerosis (HCC)   . Bradycardia    while on BB with HR to the 40s  . Carotid stenosis    CAROTID DOPPLER, 12/07/2011 - Mild arthrosclerotic changes, no high-grade stenosis  . Cataract    rt. eye  . Clostridium difficile infection 05/2019  . Diverticulitis   . Heart murmur   . History of echocardiogram    Echo 9/16:  EF 60-65%, no RWMA, Gr 1 DD, holosystolic MVP of posterior leaflet, mild MR, LA upper limits of normal, normal RVF, mod TR, PASP 50 mmHg  . Hypercholesteremia   . Hyperlipidemia   . Hypertension   . Hypothyroidism   . MVP (mitral valve prolapse)    a. Echo 12/07/2011 - EF-65-70%, severe mitral regurgitation, moderate-severe tricuspid regurgitation, moderate pulmonary HTN, moderate pulmonic regurgitation;  b. 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 Prolapsed internal hemorrhoids, grade 3 02/14/2017  . PVC (premature ventricular contraction)   . Rapid palpitations    event monitor with short bursts of SVT  . Renal cyst   . Testicular hypofunction   . Vitamin D deficiency     Past Surgical History:  Procedure Laterality Date  . COLONOSCOPY    . SIGMOIDOSCOPY  02/14/2017  . TEE WITHOUT CARDIOVERSION N/A 05/04/2020   Procedure: TRANSESOPHAGEAL ECHOCARDIOGRAM (TEE);  Surgeon: Little Ishikawa, MD;  Location: [facility] ENDOSCOPY;  Service: Cardiovascular;  Laterality: N/A;   Social History   Socioeconomic History  . Marital status: Married    Spouse name: Not on file  . Number of children: Not on file  . Years of education: Not on file  . Highest education level: Not on file  Occupational History  . Not on file  Tobacco Use  . Smoking status: Former Smoker    Quit date: 12/23/1972    Years since quitting: 47.5  . Smokeless tobacco: Former Neurosurgeon    Types: Engineer, drilling  . Vaping Use: Never used  Substance and Sexual Activity  . Alcohol use: Yes    Alcohol/week: 0.0 standard drinks    Comment: occasionally  . Drug use: No  . Sexual activity: Not on file  Other Topics Concern  . 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 Stroke Mother 2  . Heart failure Father   . Diverticulitis Sister   . Stroke Brother 2  . Heart attack Brother   . Colon cancer Neg Hx   . Esophageal cancer Neg Hx   . Stomach cancer Neg Hx   . Rectal cancer Neg Hx    Allergies  Allergen Reactions  . Contrast Media [Iodinated Diagnostic Agents] Hives, Nausea And Vomiting and Cough    Pt will need a 13 hour prep before any contrast per Dr. Paulina Fusi (05/14/19)  . Hctz [Hydrochlorothiazide] Other (See Comments)    palpitations  . Metoprolol Succinate [Metoprolol] Other  (See Comments)    Bradycardia     Prior to Admission medications   Medication Sig Start Date End Date Taking? Authorizing Provider  cholecalciferol (VITAMIN D3) 25 MCG (1000 UNIT) tablet Take 2,000 Units by mouth daily.    [provider]  ezetimibe (ZETIA) 10 MG tablet Take 10 mg by mouth at bedtime.    [provider]  ibuprofen (ADVIL) 200 MG tablet Take 400 mg by mouth every 6 (six) hours as needed for headache (Sinus).    [provider]  levofloxacin (LEVAQUIN) 500 MG tablet Take 500 mg by mouth daily.    [provider]  levothyroxine (SYNTHROID) 75 MCG tablet Take 75 mcg by mouth at bedtime.    [provider]  losartan (COZAAR) 100 MG tablet Take 1 tablet (100 mg total) by mouth daily. 06/09/15   Azalee Course, PA  predniSONE (DELTASONE) 50 MG tablet Take 50mg  prednisone on 05/10/20 @ 7pm.  Take 50mg  prednisone on 05/11/20 @ 1am.  Take 50mg  prednisone on 05/11/20 @ 7am AND 50mg  benadryl. 05/10/20   , MD  rosuvastatin (CRESTOR) 40 MG tablet Take 40 mg by mouth at bedtime.    [provider]     Positive ROS: Otherwise negative  All other systems have been reviewed and were otherwise negative with the exception of those mentioned in the HPI and as above.  Physical Exam: Constitutional: Alert, well-appearing, no acute distress Ears: External ears without lesions or tenderness. Ear canals are clear bilaterally with intact, clear TMs.  Nasal: External nose without lesions. Septum severely deviated to the right.. Clear nasal passages otherwise with no obvious mucopurulent discharge noted. Oral: Lips and gums without lesions. Tongue and palate mucosa without lesions. Posterior oropharynx clear. Neck: No palpable adenopathy or masses Cardiac: Regular rate and rhythm with prominent systolic murmur consistent with mitral valve prolapse. Respiratory: Breathing comfortably  Skin: No facial/neck lesions or rash  noted.  Procedures  Assessment: Chronic left sphenoid sinus disease  Plan: We will plan on left FESS with left ethmoidectomy and left sphenoidotomy.   07/09/20, MD

## 2020-08-04 ENCOUNTER — Ambulatory Visit (INDEPENDENT_AMBULATORY_CARE_PROVIDER_SITE_OTHER): Payer: Self-pay | Admitting: Otolaryngology

## 2020-08-04 DIAGNOSIS — J323 Chronic sphenoidal sinusitis: Secondary | ICD-10-CM

## 2020-08-04 NOTE — H&P (View-Only) (Signed)
PREOPERATIVE H&P  Chief Complaint: Chronic intermittent headaches  HPI: Chad Berry is a 79 y.o. male who presents for evaluation of chronic intermittent headaches that he has had for 2 years now.  He has had CT scans that demonstrated a chronic sphenoid sinus disease with very sclerotic bone around the left sphenoid sinus.  The remaining sinuses are clear.  The right sphenoid sinus is clear.  He has a septal deviation to the right.  He has taken to the operating room this time for left posterior ethmoidectomy and left sphenoidotomy.  Past Medical History:  Diagnosis Date  . Aortic atherosclerosis (HCC)   . Bradycardia    while on BB with HR to the 40s  . Carotid stenosis    CAROTID DOPPLER, 12/07/2011 - Mild arthrosclerotic changes, no high-grade stenosis  . Cataract    rt. eye  . Clostridium difficile infection 05/2019  . Diverticulitis   . Heart murmur   . History of echocardiogram    Echo 9/16:  EF 60-65%, no RWMA, Gr 1 DD, holosystolic MVP of posterior leaflet, mild MR, LA upper limits of normal, normal RVF, mod TR, PASP 50 mmHg  . Hypercholesteremia   . Hyperlipidemia   . Hypertension   . Hypothyroidism   . MVP (mitral valve prolapse)    a. Echo 12/07/2011 - EF-65-70%, severe mitral regurgitation, moderate-severe tricuspid regurgitation, moderate pulmonary HTN, moderate pulmonic regurgitation;  b. 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 Prolapsed internal hemorrhoids, grade 3 02/14/2017  . PVC (premature ventricular contraction)   . Rapid palpitations    event monitor with short bursts of SVT  . Renal cyst   . Testicular hypofunction   . Vitamin D deficiency    Past Surgical History:  Procedure Laterality Date  . COLONOSCOPY    . SIGMOIDOSCOPY  02/14/2017  . TEE WITHOUT CARDIOVERSION N/A  05/04/2020   Procedure: TRANSESOPHAGEAL ECHOCARDIOGRAM (TEE);  Surgeon: Schumann, Davaun Quintela L, MD;  Location: [facility] ENDOSCOPY;  Service: Cardiovascular;  Laterality: N/A;   Social History   Socioeconomic History  . Marital status: Married    Spouse name: Not on file  . Number of children: Not on file  . Years of education: Not on file  . Highest education level: Not on file  Occupational History  . Not on file  Tobacco Use  . Smoking status: Former Smoker    Quit date: 12/23/1972    Years since quitting: 47.6  . Smokeless tobacco: Former User    Types: Chew  Vaping Use  . Vaping Use: Never used  Substance and Sexual Activity  . Alcohol use: Yes    Alcohol/week: 0.0 standard drinks    Comment: occasionally  . Drug use: No  . Sexual activity: Not on file  Other Topics Concern  . 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 Stroke Mother 90  . Heart   failure Father   . Diverticulitis Sister   . Stroke Brother 64  . Heart attack Brother   . Colon cancer Neg Hx   . Esophageal cancer Neg Hx   . Stomach cancer Neg Hx   . Rectal cancer Neg Hx    Allergies  Allergen Reactions  . Contrast Media [Iodinated Diagnostic Agents] Hives, Nausea And Vomiting and Cough    Pt will need a 13 hour prep before any contrast per Dr. Paulina Fusi (05/14/19)  . Hctz [Hydrochlorothiazide] Other (See Comments)    palpitations  . Metoprolol Succinate [Metoprolol] Other (See Comments)    Bradycardia     Prior to Admission medications   Medication Sig Start Date End Date Taking? Authorizing Provider   cholecalciferol (VITAMIN D3) 25 MCG (1000 UNIT) tablet Take 2,000 Units by mouth daily.    [provider]  ezetimibe (ZETIA) 10 MG tablet Take 10 mg by mouth at bedtime.    [provider]  ibuprofen (ADVIL) 200 MG tablet Take 400 mg by mouth every 6 (six) hours as needed for headache (Sinus).    [provider]  levofloxacin (LEVAQUIN) 500 MG tablet Take 500 mg by mouth daily.    [provider]  levothyroxine (SYNTHROID) 75 MCG tablet Take 75 mcg by mouth at bedtime.    [provider]  losartan (COZAAR) 100 MG tablet Take 1 tablet (100 mg total) by mouth daily. 06/09/15   Azalee Course, PA  predniSONE (DELTASONE) 50 MG tablet Take 50mg  prednisone on 05/10/20 @ 7pm.  Take 50mg  prednisone on 05/11/20 @ 1am.  Take 50mg  prednisone on 05/11/20 @ 7am AND 50mg  benadryl. 05/10/20   , MD  rosuvastatin (CRESTOR) 40 MG tablet Take 40 mg by mouth at bedtime.    [provider]     Positive ROS: Otherwise negative  All other systems have been reviewed and were otherwise negative with the exception of those mentioned in the HPI and as above.  Physical Exam: There were no vitals filed for this visit.  General: Alert, no acute distress Oral: Normal oral mucosa and tonsils Nasal: Clear nasal passages septum is deviated to the right.  No intranasal polyps noted and no purulent discharge noted. Neck: No palpable adenopathy or thyroid nodules Ear: Ear canal is clear with normal appearing TMs Cardiovascular: Regular rate and rhythm, no murmur.  Respiratory: Clear to auscultation Neurologic: Alert and oriented x 3   Assessment/Plan: Chronic left sphenoid sinusitis  Plan for fusion guided endoscopic sinus surgery with left posterior ethmoidectomy and left sphenoidotomy   07/09/20, MD 08/04/2020 2:30 PM

## 2020-08-04 NOTE — H&P (Signed)
PREOPERATIVE H&P  Chief Complaint: Chronic intermittent headaches  HPI: Chad Berry is a 80 y.o. male who presents for evaluation of chronic intermittent headaches that he has had for 2 years now.  He has had CT scans that demonstrated a chronic sphenoid sinus disease with very sclerotic bone around the left sphenoid sinus.  The remaining sinuses are clear.  The right sphenoid sinus is clear.  He has a septal deviation to the right.  He has taken to the operating room this time for left posterior ethmoidectomy and left sphenoidotomy.  Past Medical History:  Diagnosis Date  . Aortic atherosclerosis (HCC)   . Bradycardia    while on BB with HR to the 40s  . Carotid stenosis    CAROTID DOPPLER, 12/07/2011 - Mild arthrosclerotic changes, no high-grade stenosis  . Cataract    rt. eye  . Clostridium difficile infection 05/2019  . Diverticulitis   . Heart murmur   . History of echocardiogram    Echo 9/16:  EF 60-65%, no RWMA, Gr 1 DD, holosystolic MVP of posterior leaflet, mild MR, LA upper limits of normal, normal RVF, mod TR, PASP 50 mmHg  . Hypercholesteremia   . Hyperlipidemia   . Hypertension   . Hypothyroidism   . MVP (mitral valve prolapse)    a. Echo 12/07/2011 - EF-65-70%, severe mitral regurgitation, moderate-severe tricuspid regurgitation, moderate pulmonary HTN, moderate pulmonic regurgitation;  b. 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 Prolapsed internal hemorrhoids, grade 3 02/14/2017  . PVC (premature ventricular contraction)   . Rapid palpitations    event monitor with short bursts of SVT  . Renal cyst   . Testicular hypofunction   . Vitamin D deficiency    Past Surgical History:  Procedure Laterality Date  . COLONOSCOPY    . SIGMOIDOSCOPY  02/14/2017  . TEE WITHOUT CARDIOVERSION N/A  05/04/2020   Procedure: TRANSESOPHAGEAL ECHOCARDIOGRAM (TEE);  Surgeon: Little Ishikawa, MD;  Location: [facility] ENDOSCOPY;  Service: Cardiovascular;  Laterality: N/A;   Social History   Socioeconomic History  . Marital status: Married    Spouse name: Not on file  . Number of children: Not on file  . Years of education: Not on file  . Highest education level: Not on file  Occupational History  . Not on file  Tobacco Use  . Smoking status: Former Smoker    Quit date: 12/23/1972    Years since quitting: 47.6  . Smokeless tobacco: Former Neurosurgeon    Types: Engineer, drilling  . Vaping Use: Never used  Substance and Sexual Activity  . Alcohol use: Yes    Alcohol/week: 0.0 standard drinks    Comment: occasionally  . Drug use: No  . Sexual activity: Not on file  Other Topics Concern  . 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 Stroke Mother 44  . Heart  failure Father   . Diverticulitis Sister   . Stroke Brother 64  . Heart attack Brother   . Colon cancer Neg Hx   . Esophageal cancer Neg Hx   . Stomach cancer Neg Hx   . Rectal cancer Neg Hx    Allergies  Allergen Reactions  . Contrast Media [Iodinated Diagnostic Agents] Hives, Nausea And Vomiting and Cough    Pt will need a 13 hour prep before any contrast per Dr. Paulina Fusi (05/14/19)  . Hctz [Hydrochlorothiazide] Other (See Comments)    palpitations  . Metoprolol Succinate [Metoprolol] Other (See Comments)    Bradycardia     Prior to Admission medications   Medication Sig Start Date End Date Taking? Authorizing Provider   cholecalciferol (VITAMIN D3) 25 MCG (1000 UNIT) tablet Take 2,000 Units by mouth daily.    [provider]  ezetimibe (ZETIA) 10 MG tablet Take 10 mg by mouth at bedtime.    [provider]  ibuprofen (ADVIL) 200 MG tablet Take 400 mg by mouth every 6 (six) hours as needed for headache (Sinus).    [provider]  levofloxacin (LEVAQUIN) 500 MG tablet Take 500 mg by mouth daily.    [provider]  levothyroxine (SYNTHROID) 75 MCG tablet Take 75 mcg by mouth at bedtime.    [provider]  losartan (COZAAR) 100 MG tablet Take 1 tablet (100 mg total) by mouth daily. 06/09/15   Azalee Course, PA  predniSONE (DELTASONE) 50 MG tablet Take 50mg  prednisone on 05/10/20 @ 7pm.  Take 50mg  prednisone on 05/11/20 @ 1am.  Take 50mg  prednisone on 05/11/20 @ 7am AND 50mg  benadryl. 05/10/20   , MD  rosuvastatin (CRESTOR) 40 MG tablet Take 40 mg by mouth at bedtime.    [provider]     Positive ROS: Otherwise negative  All other systems have been reviewed and were otherwise negative with the exception of those mentioned in the HPI and as above.  Physical Exam: There were no vitals filed for this visit.  General: Alert, no acute distress Oral: Normal oral mucosa and tonsils Nasal: Clear nasal passages septum is deviated to the right.  No intranasal polyps noted and no purulent discharge noted. Neck: No palpable adenopathy or thyroid nodules Ear: Ear canal is clear with normal appearing TMs Cardiovascular: Regular rate and rhythm, no murmur.  Respiratory: Clear to auscultation Neurologic: Alert and oriented x 3   Assessment/Plan: Chronic left sphenoid sinusitis  Plan for fusion guided endoscopic sinus surgery with left posterior ethmoidectomy and left sphenoidotomy   07/09/20, MD 08/04/2020 2:30 PM

## 2020-08-05 NOTE — Progress Notes (Signed)
Chart reviewed with Dr. Salvadore Farber. Because of current cardiac notes and severe mitral valve regurg, patient will need to have sinus surgery at the main OR. Left a message for Rinaldo Cloud at Dr. Allene Pyo office.

## 2020-08-09 ENCOUNTER — Other Ambulatory Visit (HOSPITAL_COMMUNITY): Payer: 59

## 2020-08-12 ENCOUNTER — Encounter (HOSPITAL_COMMUNITY): Payer: Self-pay | Admitting: Otolaryngology

## 2020-08-12 ENCOUNTER — Other Ambulatory Visit (HOSPITAL_COMMUNITY)
Admission: RE | Admit: 2020-08-12 | Discharge: 2020-08-12 | Disposition: A | Discharge status: Home / Self Care | Payer: 59 | Source: Ambulatory Visit | Attending: Otolaryngology | Admitting: Otolaryngology

## 2020-08-12 DIAGNOSIS — Z20822 Contact with and (suspected) exposure to covid-19: Secondary | ICD-10-CM | POA: Insufficient documentation

## 2020-08-12 DIAGNOSIS — Z01812 Encounter for preprocedural laboratory examination: Secondary | ICD-10-CM | POA: Diagnosis present

## 2020-08-12 LAB — SARS CORONAVIRUS 2 (TAT 6-24 HRS): SARS Coronavirus 2: NEGATIVE

## 2020-08-12 NOTE — Anesthesia Preprocedure Evaluation (Addendum)
Anesthesia Evaluation  Patient identified by MRN, date of birth, ID band Patient awake    Reviewed: Allergy & Precautions, NPO status , Patient's Chart, lab work & pertinent test results  Airway Mallampati: II  TM Distance: >3 FB Neck ROM: Full    Dental  (+) Teeth Intact, Dental Advisory Given   Pulmonary former smoker,    breath sounds clear to auscultation       Cardiovascular hypertension,  Rhythm:Regular Rate:Normal + Systolic murmurs    Neuro/Psych    GI/Hepatic   Endo/Other    Renal/GU      Musculoskeletal   Abdominal   Peds  Hematology   Anesthesia Other Findings   Reproductive/Obstetrics                           Anesthesia Physical Anesthesia Plan  ASA: III  Anesthesia Plan: General   Post-op Pain Management:    Induction: Intravenous  PONV Risk Score and Plan: Ondansetron and Dexamethasone  Airway Management Planned: Oral ETT  Additional Equipment:   Intra-op Plan:   Post-operative Plan: Extubation in OR  Informed Consent: I have reviewed the patients History and Physical, chart, labs and discussed the procedure including the risks, benefits and alternatives for the proposed anesthesia with the patient or authorized representative who has indicated his/her understanding and acceptance.     Dental advisory given  Plan Discussed with: CRNA and Anesthesiologist  Anesthesia Plan Comments: (PAT note by Antionette Poles, PA-C:  Patient has known of presence of heart murmur and mitral valve prolapse for more than 20 years.  He has been followed up for many years by Dr. Allyson Sabal. Recent echocardiogram performed March 10, 2020 revealed normal left ventricular size and systolic function with mitral valve prolapse and severe mitral regurgitation.  There was mild left atrial enlargement.  The patient subsequently underwent transesophageal echocardiogram May 04, 2020.  TEE  revealed mitral valve prolapse with an obvious flail segment involving the middle scallop (P2) of the posterior leaflet causing severe mitral regurgitation.  Left ventricular size and systolic function remain normal.  There was mild left atrial enlargement.  No other significant abnormalities were noted.   Patient has been seen by cardiothoracic surgery and is planning to undergo intervention in the near future. However, he has chronic sinusitis that also needs to be addressed. Dr. Allyson Sabal commented on this in progress note 05/28/20 stating, "Mr. Monjaraz returns today for follow-up.  He did see Dr. Cornelius Moras in my request 05/10/2020.  He had a transesophageal echo performed by Dr. Bjorn Pippin 05/04/2020 revealing a flail P2 segment with severe MR.  He apparently has a sinus infection which is being treated by an ENT doctor with antibiotics and nasal flushes potentially requiring surgery all of which should be done before any mitral valve procedure.  He wishes to delay this for several months.  I will see him back in 3 months at which time we will discuss doing a right left heart cath and pursuing his mitral valve surgery with Dr. Cornelius Moras."  Will need DOS labs and evaluation.   EKG 03/30/20: NSR. Rate 61. Incomplete LBBB.  TEE 05/04/20: 1. Flail P2 segment of posterior leaflet of mitral valve causing  eccentric anterior directed MR jet. Severe mitral valve regurgitation. 3D  vena contracta area 0.6 cm^2, consistent with severe MR.  2. Left ventricular ejection fraction, by estimation, is 60 to 65%. The  left ventricle has normal function.  3. Right ventricular systolic function  is normal. The right ventricular  size is mildly enlarged.  4. Left atrial size was mildly dilated. No left atrial/left atrial  appendage thrombus was detected.  5. The aortic valve is tricuspid. Aortic valve regurgitation is trivial.  No aortic stenosis is present.  6. Aortic dilatation noted. There is mild dilatation of the ascending   aorta, measuring 38 mm.   Nuclear stress 06/18/15: Nuclear stress EF: 60%. Blood pressure demonstrated a hypertensive response to exercise. There was no ST segment deviation noted during stress. The study is normal. This is a low risk study. The left ventricular ejection fraction is normal (55-65%).  )       Anesthesia Quick Evaluation

## 2020-08-12 NOTE — Progress Notes (Signed)
Anesthesia Chart Review: Same day workup  Patient has known of presence of heart murmur and mitral valve prolapse for more than 20 years.  He has been followed up for many years by Dr. Allyson Sabal. Recent echocardiogram performed March 10, 2020 revealed normal left ventricular size and systolic function with mitral valve prolapse and severe mitral regurgitation.  There was mild left atrial enlargement.  The patient subsequently underwent transesophageal echocardiogram May 04, 2020.  TEE revealed mitral valve prolapse with an obvious flail segment involving the middle scallop (P2) of the posterior leaflet causing severe mitral regurgitation.  Left ventricular size and systolic function remain normal.  There was mild left atrial enlargement.  No other significant abnormalities were noted.   Patient has been seen by cardiothoracic surgery and is planning to undergo intervention in the near future. However, he has chronic sinusitis that also needs to be addressed. Dr. Allyson Sabal commented on this in progress note 05/28/20 stating, "Mr. Macdonnell returns today for follow-up.  He did see Dr. Cornelius Moras in my request 05/10/2020.  He had a transesophageal echo performed by Dr. Bjorn Pippin 05/04/2020 revealing a flail P2 segment with severe MR.  He apparently has a sinus infection which is being treated by an ENT doctor with antibiotics and nasal flushes potentially requiring surgery all of which should be done before any mitral valve procedure.  He wishes to delay this for several months.  I will see him back in 3 months at which time we will discuss doing a right left heart cath and pursuing his mitral valve surgery with Dr. Cornelius Moras."  Will need DOS labs and evaluation.   EKG 03/30/20: NSR. Rate 61. Incomplete LBBB.  TEE 05/04/20: 1. Flail P2 segment of posterior leaflet of mitral valve causing  eccentric anterior directed MR jet. Severe mitral valve regurgitation. 3D  vena contracta area 0.6 cm^2, consistent with severe MR.  2.  Left ventricular ejection fraction, by estimation, is 60 to 65%. The  left ventricle has normal function.  3. Right ventricular systolic function is normal. The right ventricular  size is mildly enlarged.  4. Left atrial size was mildly dilated. No left atrial/left atrial  appendage thrombus was detected.  5. The aortic valve is tricuspid. Aortic valve regurgitation is trivial.  No aortic stenosis is present.  6. Aortic dilatation noted. There is mild dilatation of the ascending  aorta, measuring 38 mm.   Nuclear stress 06/18/15:  Nuclear stress EF: 60%.  Blood pressure demonstrated a hypertensive response to exercise.  There was no ST segment deviation noted during stress.  The study is normal.  This is a low risk study.  The left ventricular ejection fraction is normal (55-65%).   Zannie Cove [facility] Short Stay Center/Anesthesiology Phone [phone] 08/12/2020 3:12 PM

## 2020-08-12 NOTE — Progress Notes (Signed)
PCP: Cardiologist: Dr. Nanetta Batty, MD  EKG: 03/30/20 CXR: 03/05/20 ECHO: 05/04/20 Stress Test: 06/18/15 Cardiac Cath: Patient states he is suppose to have cath this month  OSA/CPAP: No  ASA/Blood Thinners: No  Covid test 08/12/20 pending   Fasting Blood Sugar- na Checks Blood Sugar_na__ times a day  Anesthesia Review:  Yes, mitral valve prolapse with SOB.  Patient states will have cardiac cath after his sinus surgery.  Denies chest pain or any other symptoms.

## 2020-08-13 ENCOUNTER — Other Ambulatory Visit: Payer: Self-pay

## 2020-08-13 ENCOUNTER — Ambulatory Visit (HOSPITAL_COMMUNITY): Payer: 59 | Admitting: Physician Assistant

## 2020-08-13 ENCOUNTER — Encounter (HOSPITAL_COMMUNITY)
Admission: RE | Disposition: A | Discharge status: Home / Self Care | Payer: Self-pay | Source: Home / Self Care | Attending: Otolaryngology

## 2020-08-13 ENCOUNTER — Ambulatory Visit (HOSPITAL_COMMUNITY)
Admission: RE | Admit: 2020-08-13 | Discharge: 2020-08-13 | Disposition: A | Discharge status: Home / Self Care | Payer: 59 | Attending: Otolaryngology | Admitting: Otolaryngology

## 2020-08-13 ENCOUNTER — Encounter (HOSPITAL_COMMUNITY): Payer: Self-pay | Admitting: Otolaryngology

## 2020-08-13 DIAGNOSIS — I341 Nonrheumatic mitral (valve) prolapse: Secondary | ICD-10-CM | POA: Insufficient documentation

## 2020-08-13 DIAGNOSIS — J323 Chronic sphenoidal sinusitis: Secondary | ICD-10-CM | POA: Insufficient documentation

## 2020-08-13 DIAGNOSIS — Z87891 Personal history of nicotine dependence: Secondary | ICD-10-CM | POA: Insufficient documentation

## 2020-08-13 DIAGNOSIS — Z79899 Other long term (current) drug therapy: Secondary | ICD-10-CM | POA: Diagnosis not present

## 2020-08-13 DIAGNOSIS — I340 Nonrheumatic mitral (valve) insufficiency: Secondary | ICD-10-CM | POA: Insufficient documentation

## 2020-08-13 DIAGNOSIS — Z79890 Hormone replacement therapy (postmenopausal): Secondary | ICD-10-CM | POA: Insufficient documentation

## 2020-08-13 DIAGNOSIS — J322 Chronic ethmoidal sinusitis: Secondary | ICD-10-CM

## 2020-08-13 HISTORY — PX: ETHMOIDECTOMY: SHX5197

## 2020-08-13 HISTORY — PX: SINUS ENDO WITH FUSION: SHX5329

## 2020-08-13 LAB — CBC
HCT: 41.2 % (ref 39.0–52.0)
Hemoglobin: 14.3 g/dL (ref 13.0–17.0)
MCH: 32.7 pg (ref 26.0–34.0)
MCHC: 34.7 g/dL (ref 30.0–36.0)
MCV: 94.3 fL (ref 80.0–100.0)
Platelets: 152 10*3/uL (ref 150–400)
RBC: 4.37 MIL/uL (ref 4.22–5.81)
RDW: 12.5 % (ref 11.5–15.5)
WBC: 4.5 10*3/uL (ref 4.0–10.5)
nRBC: 0 % (ref 0.0–0.2)

## 2020-08-13 LAB — BASIC METABOLIC PANEL
Anion gap: 4 — ABNORMAL LOW (ref 5–15)
BUN: 13 mg/dL (ref 8–23)
CO2: 26 mmol/L (ref 22–32)
Calcium: 8.7 mg/dL — ABNORMAL LOW (ref 8.9–10.3)
Chloride: 109 mmol/L (ref 98–111)
Creatinine, Ser: 1.1 mg/dL (ref 0.61–1.24)
GFR, Estimated: >60 mL/min (ref >60)
Glucose, Bld: 101 mg/dL — ABNORMAL HIGH (ref 70–99)
Potassium: 3.7 mmol/L (ref 3.5–5.1)
Sodium: 139 mmol/L (ref 135–145)

## 2020-08-13 SURGERY — ETHMOIDECTOMY
Anesthesia: General | Site: Face | Laterality: Left

## 2020-08-13 MED ORDER — GLYCOPYRROLATE PF 0.2 MG/ML IJ SOSY
PREFILLED_SYRINGE | INTRAMUSCULAR | Status: DC | PRN
  Administered 2020-08-13: .1 mg via INTRAVENOUS

## 2020-08-13 MED ORDER — ROCURONIUM BROMIDE 10 MG/ML (PF) SYRINGE
PREFILLED_SYRINGE | INTRAVENOUS | Status: AC
  Filled 2020-08-13: qty 10

## 2020-08-13 MED ORDER — CEFAZOLIN SODIUM-DEXTROSE 2-4 GM/100ML-% IV SOLN
2.0000 g | INTRAVENOUS | Status: AC
  Administered 2020-08-13: 2 g via INTRAVENOUS
  Filled 2020-08-13: qty 100

## 2020-08-13 MED ORDER — MUPIROCIN 2 % EX OINT
TOPICAL_OINTMENT | CUTANEOUS | Status: DC | PRN
  Administered 2020-08-13: 1 via NASAL

## 2020-08-13 MED ORDER — ROCURONIUM BROMIDE 100 MG/10ML IV SOLN
INTRAVENOUS | Status: DC | PRN
  Administered 2020-08-13: 65 mg via INTRAVENOUS

## 2020-08-13 MED ORDER — KETAMINE HCL 10 MG/ML IJ SOLN
INTRAMUSCULAR | Status: DC | PRN
  Administered 2020-08-13: 25 mg via INTRAVENOUS

## 2020-08-13 MED ORDER — MIDAZOLAM HCL 5 MG/5ML IJ SOLN
INTRAMUSCULAR | Status: DC | PRN
  Administered 2020-08-13: 1 mg via INTRAVENOUS

## 2020-08-13 MED ORDER — MIDAZOLAM HCL 2 MG/2ML IJ SOLN
INTRAMUSCULAR | Status: AC
  Filled 2020-08-13: qty 2

## 2020-08-13 MED ORDER — BACITRACIN ZINC 500 UNIT/GM EX OINT
TOPICAL_OINTMENT | CUTANEOUS | Status: AC
  Filled 2020-08-13: qty 28.35

## 2020-08-13 MED ORDER — FENTANYL CITRATE (PF) 250 MCG/5ML IJ SOLN
INTRAMUSCULAR | Status: AC
  Filled 2020-08-13: qty 5

## 2020-08-13 MED ORDER — LIDOCAINE-EPINEPHRINE 1 %-1:100000 IJ SOLN
INTRAMUSCULAR | Status: DC | PRN
  Administered 2020-08-13: 9 mL

## 2020-08-13 MED ORDER — EPHEDRINE SULFATE-NACL 50-0.9 MG/10ML-% IV SOSY
PREFILLED_SYRINGE | INTRAVENOUS | Status: DC | PRN
  Administered 2020-08-13 (×3): 10 mg via INTRAVENOUS

## 2020-08-13 MED ORDER — STERILE WATER FOR IRRIGATION IR SOLN
Status: DC | PRN
  Administered 2020-08-13: 200 mL

## 2020-08-13 MED ORDER — LIDOCAINE 2% (20 MG/ML) 5 ML SYRINGE
INTRAMUSCULAR | Status: DC | PRN
  Administered 2020-08-13 (×2): 20 mg via INTRAVENOUS

## 2020-08-13 MED ORDER — CHLORHEXIDINE GLUCONATE CLOTH 2 % EX PADS: 6.0000 | MEDICATED_PAD | Freq: Once | CUTANEOUS | Status: DC

## 2020-08-13 MED ORDER — ONDANSETRON HCL 4 MG/2ML IJ SOLN
INTRAMUSCULAR | Status: AC
  Filled 2020-08-13: qty 2

## 2020-08-13 MED ORDER — CHLORHEXIDINE GLUCONATE 0.12 % MT SOLN: 15.0000 mL | Freq: Once | OROMUCOSAL | Status: AC

## 2020-08-13 MED ORDER — DEXAMETHASONE SODIUM PHOSPHATE 10 MG/ML IJ SOLN
INTRAMUSCULAR | Status: DC | PRN
  Administered 2020-08-13: 10 mg via INTRAVENOUS

## 2020-08-13 MED ORDER — OXYMETAZOLINE HCL 0.05 % NA SOLN
NASAL | Status: AC
  Filled 2020-08-13: qty 30

## 2020-08-13 MED ORDER — ONDANSETRON HCL 4 MG/2ML IJ SOLN
INTRAMUSCULAR | Status: DC | PRN
  Administered 2020-08-13: 4 mg via INTRAVENOUS

## 2020-08-13 MED ORDER — FENTANYL CITRATE (PF) 100 MCG/2ML IJ SOLN
INTRAMUSCULAR | Status: AC
  Filled 2020-08-13: qty 2

## 2020-08-13 MED ORDER — HYDROCODONE-ACETAMINOPHEN 5-325 MG PO TABS: 1.0000 | ORAL_TABLET | ORAL | 0 refills | Status: AC | PRN

## 2020-08-13 MED ORDER — LIDOCAINE 2% (20 MG/ML) 5 ML SYRINGE
INTRAMUSCULAR | Status: AC
  Filled 2020-08-13: qty 5

## 2020-08-13 MED ORDER — SUGAMMADEX SODIUM 200 MG/2ML IV SOLN
INTRAVENOUS | Status: DC | PRN
  Administered 2020-08-13: 200 mg via INTRAVENOUS

## 2020-08-13 MED ORDER — LIDOCAINE-EPINEPHRINE 1 %-1:100000 IJ SOLN
INTRAMUSCULAR | Status: AC
  Filled 2020-08-13: qty 1

## 2020-08-13 MED ORDER — CHLORHEXIDINE GLUCONATE 0.12 % MT SOLN
OROMUCOSAL | Status: AC
  Administered 2020-08-13: 15 mL via OROMUCOSAL
  Filled 2020-08-13: qty 15

## 2020-08-13 MED ORDER — PHENYLEPHRINE HCL-NACL 10-0.9 MG/250ML-% IV SOLN
INTRAVENOUS | Status: DC | PRN
  Administered 2020-08-13: 30 ug/min via INTRAVENOUS

## 2020-08-13 MED ORDER — OXYCODONE HCL 5 MG/5ML PO SOLN: 5.0000 mg | Freq: Once | ORAL | Status: DC | PRN

## 2020-08-13 MED ORDER — OXYMETAZOLINE HCL 0.05 % NA SOLN
NASAL | Status: DC | PRN
  Administered 2020-08-13: 1 via TOPICAL

## 2020-08-13 MED ORDER — FENTANYL CITRATE (PF) 100 MCG/2ML IJ SOLN
25.0000 ug | INTRAMUSCULAR | Status: DC | PRN
  Administered 2020-08-13: 50 ug via INTRAVENOUS

## 2020-08-13 MED ORDER — FENTANYL CITRATE (PF) 250 MCG/5ML IJ SOLN
INTRAMUSCULAR | Status: DC | PRN
  Administered 2020-08-13 (×3): 50 ug via INTRAVENOUS

## 2020-08-13 MED ORDER — GLYCOPYRROLATE PF 0.2 MG/ML IJ SOSY
PREFILLED_SYRINGE | INTRAMUSCULAR | Status: AC
  Filled 2020-08-13: qty 1

## 2020-08-13 MED ORDER — ORAL CARE MOUTH RINSE: 15.0000 mL | Freq: Once | OROMUCOSAL | Status: AC

## 2020-08-13 MED ORDER — EPHEDRINE 5 MG/ML INJ
INTRAVENOUS | Status: AC
  Filled 2020-08-13: qty 10

## 2020-08-13 MED ORDER — LACTATED RINGERS IV SOLN: INTRAVENOUS | Status: DC

## 2020-08-13 MED ORDER — KETAMINE HCL 50 MG/5ML IJ SOSY
PREFILLED_SYRINGE | INTRAMUSCULAR | Status: AC
  Filled 2020-08-13: qty 5

## 2020-08-13 MED ORDER — PROPOFOL 500 MG/50ML IV EMUL
INTRAVENOUS | Status: DC | PRN
  Administered 2020-08-13: 40 ug/kg/min via INTRAVENOUS

## 2020-08-13 MED ORDER — PROPOFOL 10 MG/ML IV BOLUS
INTRAVENOUS | Status: AC
  Filled 2020-08-13: qty 40

## 2020-08-13 MED ORDER — ONDANSETRON HCL 4 MG/2ML IJ SOLN: 4.0000 mg | Freq: Once | INTRAMUSCULAR | Status: DC | PRN

## 2020-08-13 MED ORDER — PROPOFOL 10 MG/ML IV BOLUS
INTRAVENOUS | Status: DC | PRN
  Administered 2020-08-13: 160 mg via INTRAVENOUS
  Administered 2020-08-13: 10 mg via INTRAVENOUS

## 2020-08-13 MED ORDER — MUPIROCIN 2 % EX OINT
TOPICAL_OINTMENT | CUTANEOUS | Status: AC
  Filled 2020-08-13: qty 22

## 2020-08-13 MED ORDER — OXYCODONE HCL 5 MG PO TABS: 5.0000 mg | ORAL_TABLET | Freq: Once | ORAL | Status: DC | PRN

## 2020-08-13 MED ORDER — DEXAMETHASONE SODIUM PHOSPHATE 10 MG/ML IJ SOLN
INTRAMUSCULAR | Status: AC
  Filled 2020-08-13: qty 1

## 2020-08-13 MED ORDER — CEPHALEXIN 500 MG PO CAPS: 500.0000 mg | ORAL_CAPSULE | Freq: Two times a day (BID) | ORAL | 0 refills | Status: DC

## 2020-08-13 SURGICAL SUPPLY — 49 items
ATTRACTOMAT 16X20 MAGNETIC DRP (DRAPES) IMPLANT
BLADE RAD40 ROTATE 4M 4 5PK (BLADE) IMPLANT
BLADE RAD60 ROTATE M4 4 5PK (BLADE) IMPLANT
BLADE ROTATE TRICUT 4X13 M4 (BLADE) ×2 IMPLANT
BLADE SURG 15 STRL LF DISP TIS (BLADE) IMPLANT
BLADE SURG 15 STRL SS (BLADE)
BLADE TRICUT ROTATE M4 4 5PK (BLADE) IMPLANT
BUR TAPER CHOANAL ATRESIA 30K (BURR) ×2 IMPLANT
CANISTER SUCT 3000ML PPV (MISCELLANEOUS) ×4 IMPLANT
CLEANER TIP ELECTROSURG 2X2 (MISCELLANEOUS) IMPLANT
COAGULATOR SUCT 8FR VV (MISCELLANEOUS) ×2 IMPLANT
COVER WAND RF STERILE (DRAPES) ×2 IMPLANT
DEPRESSOR TONGUE BLADE STERILE (MISCELLANEOUS) ×2 IMPLANT
DRAPE HALF SHEET 40X57 (DRAPES) IMPLANT
DRESSING NASAL KENNEDY 3.5X.9 (MISCELLANEOUS) IMPLANT
DRSG NASAL KENNEDY 3.5X.9 (MISCELLANEOUS)
DRSG NASOPORE 8CM (GAUZE/BANDAGES/DRESSINGS) ×4 IMPLANT
DRSG TELFA 3X8 NADH (GAUZE/BANDAGES/DRESSINGS) IMPLANT
ELECT COATED BLADE 2.86 ST (ELECTRODE) IMPLANT
ELECT REM PT RETURN 9FT ADLT (ELECTROSURGICAL)
ELECTRODE REM PT RTRN 9FT ADLT (ELECTROSURGICAL) IMPLANT
FILTER ARTHROSCOPY CONVERTOR (FILTER) IMPLANT
GLOVE SS BIOGEL STRL SZ 7.5 (GLOVE) ×2 IMPLANT
GLOVE SUPERSENSE BIOGEL SZ 7.5 (GLOVE) ×2
GOWN STRL REUS W/ TWL LRG LVL3 (GOWN DISPOSABLE) ×1 IMPLANT
GOWN STRL REUS W/ TWL XL LVL3 (GOWN DISPOSABLE) ×1 IMPLANT
GOWN STRL REUS W/TWL LRG LVL3 (GOWN DISPOSABLE) ×2
GOWN STRL REUS W/TWL XL LVL3 (GOWN DISPOSABLE) ×2
High Speed Curved Cutting Burr ×2 IMPLANT
KIT BASIN OR (CUSTOM PROCEDURE TRAY) ×2 IMPLANT
KIT TURNOVER KIT B (KITS) ×2 IMPLANT
MARKER SPHERE PSV REFLC NDI (MISCELLANEOUS) ×10 IMPLANT
NEEDLE HYPO 25GX1X1/2 BEV (NEEDLE) IMPLANT
NEEDLE PRECISIONGLIDE 27X1.5 (NEEDLE) ×2 IMPLANT
NEEDLE SPNL 25GX3.5 QUINCKE BL (NEEDLE) ×2 IMPLANT
NS IRRIG 1000ML POUR BTL (IV SOLUTION) ×2 IMPLANT
PAD ARMBOARD 7.5X6 YLW CONV (MISCELLANEOUS) ×4 IMPLANT
PATTIES SURGICAL .5 X3 (DISPOSABLE) ×2 IMPLANT
PENCIL BUTTON HOLSTER BLD 10FT (ELECTRODE) IMPLANT
PENCIL FOOT CONTROL (ELECTRODE) IMPLANT
SOL ANTI FOG 6CC (MISCELLANEOUS) ×1 IMPLANT
SOLUTION ANTI FOG 6CC (MISCELLANEOUS) ×1
SPECIMEN JAR SMALL (MISCELLANEOUS) ×4 IMPLANT
SPLINT NASAL DOYLE BI-VL (GAUZE/BANDAGES/DRESSINGS) IMPLANT
TOWEL GREEN STERILE FF (TOWEL DISPOSABLE) ×2 IMPLANT
TRACKER ENT PATIENT (MISCELLANEOUS) ×2 IMPLANT
TRAY ENT MC OR (CUSTOM PROCEDURE TRAY) ×2 IMPLANT
TUBE CONNECTING 12X1/4 (SUCTIONS) ×4 IMPLANT
WATER STERILE IRR 1000ML POUR (IV SOLUTION) IMPLANT

## 2020-08-13 NOTE — Discharge Instructions (Addendum)
Take Keflex 500 mg twice daily for the next 10 days. Tylenol, ibuprofen or hydrocodone tablets 1-2 every 6 hours as needed pain Okay to blow nose gently Use saline nasal sprays daily Can apply cool compress to the nose if you are having much bleeding. Call office for follow-up appointment in 7 to 10 days. Call office if you have any problems or questions. [phone]     General Anesthesia, Adult, Care After This sheet gives you information about how to care for yourself after your procedure. Your health care provider may also give you more specific instructions. If you have problems or questions, contact your health care provider. What can I expect after the procedure? After the procedure, the following side effects are common:  Pain or discomfort at the IV site.  Nausea.  Vomiting.  Sore throat.  Trouble concentrating.  Feeling cold or chills.  Feeling weak or tired.  Sleepiness and fatigue.  Soreness and body aches. These side effects can affect parts of the body that were not involved in surgery. Follow these instructions at home: For the time period you were told by your health care provider:  Rest.  Do not participate in activities where you could fall or become injured.  Do not drive or use machinery.  Do not drink alcohol.  Do not take sleeping pills or medicines that cause drowsiness.  Do not make important decisions or sign legal documents.  Do not take care of children on your own.   Eating and drinking  Follow any instructions from your health care provider about eating or drinking restrictions.  When you feel hungry, start by eating small amounts of foods that are soft and easy to digest (bland), such as toast. Gradually return to your regular diet.  Drink enough fluid to keep your urine pale yellow.  If you vomit, rehydrate by drinking water, juice, or clear broth. General instructions  If you have sleep apnea, surgery and certain medicines  can increase your risk for breathing problems. Follow instructions from your health care provider about wearing your sleep device: ? Anytime you are sleeping, including during daytime naps. ? While taking prescription pain medicines, sleeping medicines, or medicines that make you drowsy.  Have a responsible adult stay with you for the time you are told. It is important to have someone help care for you until you are awake and alert.  Return to your normal activities as told by your health care provider. Ask your health care provider what activities are safe for you.  Take over-the-counter and prescription medicines only as told by your health care provider.  If you smoke, do not smoke without supervision.  Keep all follow-up visits as told by your health care provider. This is important. Contact a health care provider if:  You have nausea or vomiting that does not get better with medicine.  You cannot eat or drink without vomiting.  You have pain that does not get better with medicine.  You are unable to pass urine.  You develop a skin rash.  You have a fever.  You have redness around your IV site that gets worse. Get help right away if:  You have difficulty breathing.  You have chest pain.  You have blood in your urine or stool, or you vomit blood. Summary  After the procedure, it is common to have a sore throat or nausea. It is also common to feel tired.  Have a responsible adult stay with you for the time  you are told. It is important to have someone help care for you until you are awake and alert.  When you feel hungry, start by eating small amounts of foods that are soft and easy to digest (bland), such as toast. Gradually return to your regular diet.  Drink enough fluid to keep your urine pale yellow.  Return to your normal activities as told by your health care provider. Ask your health care provider what activities are safe for you. This information is not  intended to replace advice given to you by your health care provider. Make sure you discuss any questions you have with your health care provider. Document Revised: 01/01/2020 Document Reviewed: 07/31/2019 Elsevier Patient Education  2021 [employer].

## 2020-08-13 NOTE — Anesthesia Procedure Notes (Signed)
Procedure Name: Intubation Date/Time: 08/13/2020 10:42 AM Performed by: Onnie Boer, CRNA Pre-anesthesia Checklist: Patient identified, Emergency Drugs available, Suction available and Patient being monitored Patient Re-evaluated:Patient Re-evaluated prior to induction Oxygen Delivery Method: Circle system utilized Preoxygenation: Pre-oxygenation with 100% oxygen Induction Type: IV induction Ventilation: Oral airway inserted - appropriate to patient size and Two handed mask ventilation required Laryngoscope Size: Miller and 2 Grade View: Grade II Tube type: Oral Tube size: 7.5 mm Number of attempts: 1 Airway Equipment and Method: Stylet and Oral airway Placement Confirmation: ETT inserted through vocal cords under direct vision,  positive ETCO2 and breath sounds checked- equal and bilateral Secured at: 22 cm Tube secured with: Tape Dental Injury: Teeth and Oropharynx as per pre-operative assessment

## 2020-08-13 NOTE — Interval H&P Note (Signed)
History and Physical Interval Note:  08/13/2020 10:24 AM  Chad Berry  has presented today for surgery, with the diagnosis of CHRONIC SINUSITIS.  The various methods of treatment have been discussed with the patient and family. After consideration of risks, benefits and other options for treatment, the patient has consented to  Procedure(s): SPENOIDECTOMY AND ETHMOIDECTOMY (Left) SINUS ENDO WITH FUSION (Left) as a surgical intervention.  The patient's history has been reviewed, patient examined, no change in status, stable for surgery.  I have reviewed the patient's chart and labs.  Questions were answered to the patient's satisfaction.     [name]

## 2020-08-13 NOTE — Anesthesia Postprocedure Evaluation (Signed)
Anesthesia Post Note  Patient: Jiyaan Pitera  Procedure(s) Performed: SPENOIDECTOMY AND ETHMOIDECTOMY (Left Face) SINUS ENDO WITH FUSION (Left Face)     Patient location during evaluation: PACU Anesthesia Type: General Level of consciousness: awake and alert Pain management: pain level controlled Vital Signs Assessment: post-procedure vital signs reviewed and stable Respiratory status: spontaneous breathing, nonlabored ventilation, respiratory function stable and patient connected to nasal cannula oxygen Cardiovascular status: blood pressure returned to baseline and stable Postop Assessment: no apparent nausea or vomiting Anesthetic complications: no   No complications documented.  Last Vitals:  Vitals:   08/13/20 1310 08/13/20 1315  BP: (!) 158/81   Pulse: 68 66  Resp: 12 17  Temp: 36.5 C   SpO2: 97% 96%    Last Pain:  Vitals:   08/13/20 1310  TempSrc:   PainSc: Asleep   Pain Goal: Patients Stated Pain Goal: 2 (08/13/20 0841)                 Kipp Brood COKER

## 2020-08-13 NOTE — Brief Op Note (Signed)
08/13/2020  12:02 PM  PATIENT:  Chad Berry  80 y.o. male  PRE-OPERATIVE DIAGNOSIS:  CHRONIC SINUSITIS  POST-OPERATIVE DIAGNOSIS:  CHRONIC SINUSITIS  PROCEDURE:  Procedure(s): SPENOIDECTOMY AND ETHMOIDECTOMY (Left) SINUS ENDO WITH FUSION (Left)  SURGEON:  Surgeon(s) and Role:    Drema Halon, MD - Primary  PHYSICIAN ASSISTANT:   ASSISTANTS: none   ANESTHESIA:   general  EBL:  40 cc   BLOOD ADMINISTERED:none  DRAINS: none   LOCAL MEDICATIONS USED:  XYLOCAINE   SPECIMEN:  No Specimen  DISPOSITION OF SPECIMEN:  N/A  COUNTS:  YES  TOURNIQUET:  * No tourniquets in log *  DICTATION: .Other Dictation: Dictation Number [id number]  PLAN OF CARE: Discharge to home after PACU  PATIENT DISPOSITION:  PACU - hemodynamically stable.   Delay start of Pharmacological VTE agent (>24hrs) due to surgical blood loss or risk of bleeding: yes

## 2020-08-13 NOTE — Transfer of Care (Signed)
Immediate Anesthesia Transfer of Care Note  Patient: Chad Berry  Procedure(s) Performed: SPENOIDECTOMY AND ETHMOIDECTOMY (Left Face) SINUS ENDO WITH FUSION (Left Face)  Patient Location: PACU  Anesthesia Type:General  Level of Consciousness: drowsy and responds to stimulation  Airway & Oxygen Therapy: Patient Spontanous Breathing and Patient connected to face mask oxygen  Post-op Assessment: Report given to RN and Post -op Vital signs reviewed and stable  Post vital signs: Reviewed and stable  Last Vitals:  Vitals Value Taken Time  BP 148/83 08/13/20 1211  Temp 36.4 C 08/13/20 1210  Pulse 70 08/13/20 1212  Resp 13 08/13/20 1212  SpO2 99 % 08/13/20 1212  Vitals shown include unvalidated device data.  Last Pain:  Vitals:   08/13/20 0841  TempSrc:   PainSc: 0-No pain      Patients Stated Pain Goal: 2 (08/13/20 0841)  Complications: No complications documented.

## 2020-08-14 NOTE — Op Note (Signed)
NAMEEMONI, YANG MEDICAL RECORD NO: [id number] ACCOUNT NO: [id number] DATE OF BIRTH: August 26, 1940 FACILITY: [facility] LOCATION: [facility] PHYSICIAN: Kristine Garbe. Ezzard Standing, MD  Operative Report   DATE OF PROCEDURE: 08/13/2020  PREOPERATIVE DIAGNOSIS:  Chronic left sphenoid sinusitis.  POSTOPERATIVE DIAGNOSIS:  Chronic left sphenoid sinusitis.  OPERATION PERFORMED:  Functional endoscopic sinus surgery using fusion with left ethmoidectomy and left sphenoidotomy with removal of fungal disease from the left sphenoid sinus.  SURGEON:  Dr. [name].  ANESTHESIA:  General endotracheal.  BLOOD LOSS:  40 mL  COMPLICATIONS:  None.  BRIEF CLINICAL NOTE:   This is a 80 year old gentleman who has been having chronic problems with headaches, some odor in the nose and on repeat CT scans appears to be chronic left sphenoid opacification with very thick bone around the sphenoid sinus  consistent with chronic left sphenoid sinusitis.  He has been tried on several rounds of antibiotics without any improvement.  He is taken to the operating room at this time for left posterior ethmoidectomy and left sphenoidotomy with irrigation of the  left sphenoid sinus.  DESCRIPTION OF PROCEDURE:  After adequate endotracheal anesthesia, the patient received 2 grams Ancef IV preoperatively.  The fusion guidance system was registered.  The face was prepped out with sterile towels.  The nose was then further prepped with  cotton pledgets soaked in Afrin, and the left middle turbinate, left posterior ethmoid and left base of the sphenoid sinus was injected with approximately 8-10 mL of Xylocaine with epinephrine.  Because of limited exposure to the sphenoid ostia, the left  middle turbinate and the left posterior ethmoid area was opened up with straight Thru-Cut forceps as well as the microdebrider.  Care was taken to not get too close to the ethmoid roof.  The sphenoid ostia was identified with the fusion system and   suction as well as the pointer.  Because of the thick bone of the sphenoid sinus, the sphenoid sinus was initially entered with a 4 mm high-speed 15-degree cutting bur.  After the osteotomy of the sphenoid sinus was created, this was enlarged with the  tapered diamond choanal atresia bur that was again 4 mm.  This was enlarged inferiorly as well as medially to approximately 1 cm size.  After enlarging the sphenoidotomy, there was thick pasty fungal debris within the sphenoid sinus.  This was removed  with suction as well as several rounds of irrigation until the sphenoid sinus opening was clear with no remaining fungal disease noted.  He did have thickening of the mucous throughout the sphenoid sinus with a little bit of bleeding.  Bleeding on the  posterior nasopharynx and posterior ethmoid area was controlled with suction cautery.  The sphenoidotomy was widely patent and the 0 endoscope was used to visualize the mucous membranes of the sphenoid sinus with no obvious residual fungal disease.  This  completed the procedure.  At the end the nose was packed with a single NasoPore that was soaked in mupirocin ointment placed back within the sphenoidotomy and the posterior ethmoid area.  There was minimal bleeding at this point, the nasopharynx and  oropharynx was cleaned with suction with no active bleeding noted.  The patient was subsequently awoken from anesthesia and transferred to recovery room.  Postop doing well.  DISPOSITION:  Placed him on Keflex 500 mg b.i.d. for the next 10 days.  He is instructed to use saline rinses and to blow the nose gently and will follow up in my office in one week  for recheck.    SUJ D: 08/13/2020 12:10:38 pm T: 08/14/2020 3:18:00 am  JOB: [id number]/ [id number]

## 2020-08-17 ENCOUNTER — Encounter (HOSPITAL_COMMUNITY): Payer: Self-pay | Admitting: Otolaryngology

## 2020-08-19 ENCOUNTER — Ambulatory Visit (INDEPENDENT_AMBULATORY_CARE_PROVIDER_SITE_OTHER): Payer: 59 | Admitting: Otolaryngology

## 2020-08-19 ENCOUNTER — Other Ambulatory Visit: Payer: Self-pay

## 2020-08-19 VITALS — Temp 97.3°F

## 2020-08-19 DIAGNOSIS — Z4889 Encounter for other specified surgical aftercare: Secondary | ICD-10-CM

## 2020-08-19 NOTE — Progress Notes (Signed)
HPI: Chad Berry is a 80 y.o. male who presents 7 days s/p left sphenoidotomy and left posterior ethmoidectomy with partial amputation of left middle turbinate.  He has been doing well with improvement of his headache.  He has complained of some pain in the top left teeth.  Past Medical History:  Diagnosis Date  . Aortic atherosclerosis (HCC)   . Bradycardia    while on BB with HR to the 40s  . Carotid stenosis    CAROTID DOPPLER, 12/07/2011 - Mild arthrosclerotic changes, no high-grade stenosis  . Cataract    rt. eye  . Clostridium difficile infection 05/2019  . Diverticulitis   . Heart murmur   . History of echocardiogram    Echo 9/16:  EF 60-65%, no RWMA, Gr 1 DD, holosystolic MVP of posterior leaflet, mild MR, LA upper limits of normal, normal RVF, mod TR, PASP 50 mmHg  . Hypercholesteremia   . Hyperlipidemia   . Hypertension   . Hypothyroidism   . MVP (mitral valve prolapse)    a. Echo 12/07/2011 - EF-65-70%, severe mitral regurgitation, moderate-severe tricuspid regurgitation, moderate pulmonary HTN, moderate pulmonic regurgitation;  b. 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 Prolapsed internal hemorrhoids, grade 3 02/14/2017  . PVC (premature ventricular contraction)   . Rapid palpitations    event monitor with short bursts of SVT  . Renal cyst   . Testicular hypofunction   . Vitamin D deficiency    Past Surgical History:  Procedure Laterality Date  . COLONOSCOPY    . ETHMOIDECTOMY Left 08/13/2020   Procedure: SPENOIDECTOMY AND ETHMOIDECTOMY;  Surgeon: Drema Halon, MD;  Location: [facility] OR;  Service: ENT;  Laterality: Left;  . SIGMOIDOSCOPY  02/14/2017  . SINUS ENDO WITH FUSION Left 08/13/2020   Procedure: SINUS ENDO WITH FUSION;  Surgeon: Drema Halon, MD;  Location: [facility] OR;   Service: ENT;  Laterality: Left;  . TEE WITHOUT CARDIOVERSION N/A 05/04/2020   Procedure: TRANSESOPHAGEAL ECHOCARDIOGRAM (TEE);  Surgeon: Little Ishikawa, MD;  Location: [facility] ENDOSCOPY;  Service: Cardiovascular;  Laterality: N/A;   Social History   Socioeconomic History  . Marital status: Married    Spouse name: Not on file  . Number of children: Not on file  . Years of education: Not on file  . Highest education level: Not on file  Occupational History  . Not on file  Tobacco Use  . Smoking status: Former Smoker    Quit date: 12/23/1972    Years since quitting: 47.6  . Smokeless tobacco: Former Neurosurgeon    Types: Engineer, drilling  . Vaping Use: Never used  Substance and Sexual Activity  . Alcohol use: Yes    Alcohol/week: 0.0 standard drinks    Comment: occasionally  . Drug use: No  . Sexual activity: Not on file  Other Topics Concern  . 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 Stroke Mother 76  . Heart failure Father   . Diverticulitis Sister   . Stroke Brother 41  . Heart attack Brother   . Colon cancer Neg Hx   . Esophageal cancer Neg Hx   . Stomach cancer Neg Hx   . Rectal cancer Neg Hx    Allergies  Allergen Reactions  . Contrast Media [Iodinated Diagnostic Agents] Hives, Nausea And Vomiting and Cough    Pt will need a 13 hour prep before any contrast per Dr. Paulina Fusi (05/14/19)  . Hctz [Hydrochlorothiazide] Other (See Comments)    palpitations  . Metoprolol Succinate [Metoprolol] Other (See Comments)    Bradycardia     Prior to Admission medications    Medication Sig Start Date End Date Taking? Authorizing Provider  acetaminophen-codeine (TYLENOL #3) 300-30 MG tablet Take 1 tablet by mouth every 4 (four) hours as needed for pain. 08/02/20   [provider]  amoxicillin (AMOXIL) 500 MG capsule Take 500 mg by mouth every 6 (six) hours. 08/02/20   [provider]  Ascorbic Acid (VITAMIN C) 1000 MG tablet Take 1,000 mg by mouth daily.    [provider]  cephALEXin (KEFLEX) 500 MG capsule Take 1 capsule (500 mg total) by mouth 2 (two) times daily. 08/13/20   Drema Halon, MD  Cholecalciferol (VITAMIN D) 50 MCG (2000 UT) tablet Take 2,000 Units by mouth daily.    [provider]  ezetimibe (ZETIA) 10 MG tablet Take 10 mg by mouth at bedtime.    [provider]  ibuprofen (ADVIL) 200 MG tablet Take 400 mg by mouth every 6 (six) hours as needed for headache (Sinus).    [provider]  levothyroxine (SYNTHROID) 75 MCG tablet Take 75 mcg by mouth at bedtime.    [provider]  losartan (COZAAR) 100 MG tablet Take 1 tablet (100 mg total) by mouth daily. 06/09/15   Azalee Course, PA  rosuvastatin (CRESTOR) 40 MG tablet Take 40 mg by mouth at bedtime.    [provider]     Physical Exam: He has crusting along the left middle turbinate.  Still has some crusting back in the sphenoid region.  No mucopurulent discharge noted.   Assessment: S/p left sphenoidotomy, left posterior ethmoidectomy, left partial middle turbinate resection.  Plan: He will complete the remaining Keflex over the next 4 days. He will continue with the saline rinses. He will follow-up in 2 weeks for recheck.   Narda Bonds, MD

## 2020-08-25 ENCOUNTER — Ambulatory Visit (INDEPENDENT_AMBULATORY_CARE_PROVIDER_SITE_OTHER): Payer: 59 | Admitting: Cardiovascular Disease

## 2020-08-25 ENCOUNTER — Encounter: Payer: Self-pay | Admitting: Cardiovascular Disease

## 2020-08-25 ENCOUNTER — Other Ambulatory Visit: Payer: Self-pay

## 2020-08-25 VITALS — BP 126/68 | HR 59 | Ht 71.0 in | Wt 229.0 lb

## 2020-08-25 DIAGNOSIS — I340 Nonrheumatic mitral (valve) insufficiency: Secondary | ICD-10-CM | POA: Diagnosis not present

## 2020-08-25 DIAGNOSIS — I10 Essential (primary) hypertension: Secondary | ICD-10-CM | POA: Diagnosis not present

## 2020-08-25 DIAGNOSIS — E782 Mixed hyperlipidemia: Secondary | ICD-10-CM

## 2020-08-25 LAB — BASIC METABOLIC PANEL
BUN/Creatinine Ratio: 15 (ref 10–24)
BUN: 16 mg/dL (ref 8–27)
CO2: 24 mmol/L (ref 20–29)
Calcium: 9.1 mg/dL (ref 8.6–10.2)
Chloride: 104 mmol/L (ref 96–106)
Creatinine, Ser: 1.07 mg/dL (ref 0.76–1.27)
Glucose: 90 mg/dL (ref 65–99)
Potassium: 4.4 mmol/L (ref 3.5–5.2)
Sodium: 141 mmol/L (ref 134–144)
eGFR: 71 mL/min/{1.73_m2} (ref >59)

## 2020-08-25 LAB — CBC
Hematocrit: 40 % (ref 37.5–51.0)
Hemoglobin: 14 g/dL (ref 13.0–17.7)
MCH: 33.1 pg — ABNORMAL HIGH (ref 26.6–33.0)
MCHC: 35 g/dL (ref 31.5–35.7)
MCV: 95 fL (ref 79–97)
Platelets: 157 10*3/uL (ref 150–450)
RBC: 4.23 x10E6/uL (ref 4.14–5.80)
RDW: 12.5 % (ref 11.6–15.4)
WBC: 7.1 10*3/uL (ref 3.4–10.8)

## 2020-08-25 MED ORDER — SODIUM CHLORIDE 0.9% FLUSH: 3.0000 mL | Freq: Two times a day (BID) | INTRAVENOUS | Status: DC

## 2020-08-25 MED ORDER — PREDNISONE 50 MG PO TABS: ORAL_TABLET | ORAL | 0 refills | Status: DC

## 2020-08-25 NOTE — Assessment & Plan Note (Signed)
History of severe MR with TEE performed by Dr. Bjorn Pippin 05/04/2020 revealing a flail P2 segment.  He has normal LV size and function with a mildly dilated left atrium.  He is symptomatic with dyspnea on exertion progressive over the last 6 months or so.  He recently had sinus surgery several weeks ago.  He wishes to proceed with right and left heart cath and mini mitral repair by Dr. Cornelius Moras in the near future.

## 2020-08-25 NOTE — H&P (View-Only) (Signed)
   08/25/2020 Chad Berry   09/28/1940  [id number]  Primary Physician Blomgren, Peter, MD Primary Cardiologist: Radha Coggins J Karo Rog MD FACP, FACC, FAHA, FSCAI  HPI:  Chad Berry is a 79 y.o.  moderatelyoverweight, married Caucasian male, father of 2, grandfather to 2 grandchildren whose son Michael is also a patient of mine, as is his wife. I saw him 05/28/2020. He was worked up for palpitations and had an echo that showed severe MR with mild mitral valve prolapse and mild LV size and function. His other problems include hypertension and hyperlipidemia. He lives an active lifestyle and is actually heading out to [place] to go coyote hunting for a week. He has changed his diet and has lost 15 pounds. He has cut out caffeine and alcohol. He has had 2 negative sleep studies. He is otherwise asymptomatic. Since I saw him several years ago he has seen Scott Weaver in the office for evaluation of palpitations. A 2-D echo revealed normal LV function with holosystolic mitral valve prolapse, mild MR and moderate TR. A 48 hour Holter monitor showed PVCs, short runs of nonsustained ventricular tachycardia and short runs of SVT. Since stopping his diuretic his palpitations have improved.  I did refer him to Dr. Taylor who recommended conservative therapy noting he had PACs and PVCs. He recommended avoidance of caffeine and alcohol. He is referred back by Dr. Blomgren for follow-up of his mitral regurgitation because of a murmur which he thought was louder than he had remembered. Repeat 2D echo performed 11/14/2017 revealed moderate to severe MR with posterior leaflet prolapse, normal LV size and function. He is completely asymptomatic. He purchased his [id number] [place] rode [place] reaching [place] above 150 mph.  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    He does complain of increasing dyspnea over the last 6 to  12 months.  He had a transesophageal echo performed by Dr. Schumann 05/04/2020 revealing a flail P2 segment with severe MR.  He wishes to proceed with mini mitral repair with Dr. Owen.   Current Meds  Medication Sig  . acetaminophen-codeine (TYLENOL #3) 300-30 MG tablet Take 1 tablet by mouth every 4 (four) hours as needed for pain.  . amoxicillin (AMOXIL) 500 MG capsule Take 500 mg by mouth every 6 (six) hours.  . Ascorbic Acid (VITAMIN C) 1000 MG tablet Take 1,000 mg by mouth daily.  . cephALEXin (KEFLEX) 500 MG capsule Take 1 capsule (500 mg total) by mouth 2 (two) times daily.  . Cholecalciferol (VITAMIN D) 50 MCG (2000 UT) tablet Take 2,000 Units by mouth daily.  . ezetimibe (ZETIA) 10 MG tablet Take 10 mg by mouth at bedtime.  . ibuprofen (ADVIL) 200 MG tablet Take 400 mg by mouth every 6 (six) hours as needed for headache (Sinus).  . levothyroxine (SYNTHROID) 75 MCG tablet Take 75 mcg by mouth at bedtime.  . losartan (COZAAR) 100 MG tablet Take 1 tablet (100 mg total) by mouth daily.  . rosuvastatin (CRESTOR) 40 MG tablet Take 40 mg by mouth at bedtime.     Allergies  Allergen Reactions  . Contrast Media [Iodinated Diagnostic Agents] Hives, Nausea And Vomiting and Cough    Pt will need a 13 hour prep before any contrast per Dr. Mark Shogry (05/14/19)  . Hctz [Hydrochlorothiazide] Other (See Comments)    palpitations  . Metoprolol Succinate [Metoprolol] Other (See Comments)      Bradycardia      Social History   Socioeconomic History  . Marital status: Married    Spouse name: Not on file  . Number of children: Not on file  . Years of education: Not on file  . Highest education level: Not on file  Occupational History  . Not on file  Tobacco Use  . Smoking status: Former Smoker    Quit date: 12/23/1972    Years since quitting: 47.7  . Smokeless tobacco: Former Neurosurgeon    Types: Engineer, drilling  . Vaping Use: Never used  Substance and Sexual Activity  . Alcohol use: Yes     Alcohol/week: 0.0 standard drinks    Comment: occasionally  . Drug use: No  . Sexual activity: Not on file  Other Topics Concern  . 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  Cardiovascular: negative for chest pain, dyspnea on exertion, edema, orthopnea, palpitations, paroxysmal nocturnal dyspnea or shortness of breath Dermatological: negative for rash Respiratory: negative for cough or wheezing Urologic: negative for hematuria Abdominal: negative for nausea, vomiting, diarrhea, bright red blood per rectum, melena, or hematemesis Neurologic: negative for visual changes, syncope, or dizziness All other systems reviewed and are otherwise negative except as noted above.    Blood pressure 126/68, pulse (!) 59, height 5\' 11"  (1.803 m), weight 229 lb (103.9 kg).  General appearance: alert and no distress Neck: no adenopathy, no carotid bruit, no JVD, supple, symmetrical, trachea midline and thyroid not enlarged, symmetric, no tenderness/mass/nodules Lungs: clear to auscultation bilaterally Heart: 2-3 over 6 apical systolic murmur consistent with mitral gravitation. Extremities: extremities normal, atraumatic, no cyanosis or edema Pulses: 2+ and symmetric Skin: Skin color, texture, turgor normal. No rashes or lesions Neurologic: Alert and oriented X 3, normal strength and tone. Normal symmetric reflexes. Normal coordination and gait  EKG sinus bradycardia 59 with a nonspecific IVCD.  I personally reviewed  this EKG.  ASSESSMENT AND PLAN:   Essential hypertension History of essential pretension blood pressure measured today at 126/68.  He is on losartan.  Hyperlipidemia History of hyperlipidemia on statin therapy followed by his PCP.  Mitral regurgitation History of severe MR with TEE performed by Dr. 05/04/2020 revealing a flail P2 segment.  He has normal LV size and function with a mildly dilated left atrium.  He is symptomatic with dyspnea on exertion progressive over the last 6 months or so.  He recently had sinus surgery several weeks ago.  He wishes to proceed with right and left heart cath and mini mitral repair by Dr. 07/02/2020 in the near future.      Cornelius Moras MD FACP,FACC,FAHA, [facility] 08/25/2020 9:36 AM

## 2020-08-25 NOTE — Assessment & Plan Note (Signed)
History of essential pretension blood pressure measured today at 126/68.  He is on losartan.

## 2020-08-25 NOTE — Assessment & Plan Note (Signed)
History of hyperlipidemia on statin therapy followed by his PCP 

## 2020-08-25 NOTE — Patient Instructions (Signed)
    [facility] CARDIOVASCULAR DIVISION [facility] [address] [place] [address] [place] [address] Dept: [phone] Loc: [phone]  Chad Berry  08/25/2020  You are scheduled for a Cardiac Catheterization on Monday, May 2 with Dr. Nanetta Batty.  1. Please arrive at the [facility] (Main Entrance A) at [facility]: [address] [place], [place] [address] at 7:30 AM (This time is two hours before your procedure to ensure your preparation). Free valet parking service is available.   Special note: Every effort is made to have your procedure done on time. Please understand that emergencies sometimes delay scheduled procedures.  2. Diet: Do not eat solid foods after midnight.  The patient may have clear liquids until 5am upon the day of the procedure.  3. Labs: You will need to have blood drawn today.  4. Medication instructions in preparation for your procedure:   Contrast Allergy: Yes, Please take Prednisone 50mg  by mouth at: Thirteen hours prior to cath 8:00pm on Sunday Seven hours prior to cath 2:00am on Monday And 1 hour prior to procedure please take last dose of Prednisone 50mg  and Benadryl 50mg  by mouth.    On the morning of your procedure, take your Aspirin and any morning medicines NOT listed above.  You may use sips of water.  5. Plan for one night stay--bring personal belongings. 6. Bring a current list of your medications and current insurance cards. 7. You MUST have a responsible person to drive you home. 8. Someone MUST be with you the first 24 hours after you arrive home or your discharge will be delayed. 9. Please wear clothes that are easy to get on and off and wear slip-on shoes.  Thank you for allowing Saturday to care for you!   -- [facility]  You will need a COVID-19  test prior to your procedure. You are scheduled for Friday, 4/29 at 9:30 AM. This is a Drive Up Visit at [place]  [address] [place], 5/29 [address]. Someone will direct you to the appropriate testing line. Stay in your car and someone will be with you shortly.  You will need to follow up with Dr. AURA 2 weeks after procedure.

## 2020-08-25 NOTE — Progress Notes (Signed)
08/25/2020 Chad Berry   1940/05/25  [id number]  Primary Physician Chad Putt, MD Primary Cardiologist: Chad Gess MD Chad Berry  HPI:  Chad Berry is a 80 y.o.  moderatelyoverweight, married Caucasian male, father of 2, grandfather to 2 grandchildren whose son Chad Berry is also a patient of mine, as is his wife. I saw him 05/28/2020. He was worked up for palpitations and had an echo that showed severe MR with mild mitral valve prolapse and mild LV size and function. His other problems include hypertension and hyperlipidemia. He lives an active lifestyle and is actually heading out to [place] to go coyote hunting for a week. He has changed his diet and has lost 15 pounds. He has cut out caffeine and alcohol. He has had 2 negative sleep studies. He is otherwise asymptomatic. Since I saw him several years ago he has seen Chad Berry in the office for evaluation of palpitations. A 2-D echo revealed normal LV function with holosystolic mitral valve prolapse, mild MR and moderate TR. A 48 hour Holter monitor showed PVCs, short runs of nonsustained ventricular tachycardia and short runs of SVT. Since stopping his diuretic his palpitations have improved.  I did refer him to Chad Berry who recommended conservative therapy noting he had PACs and PVCs. He recommended avoidance of caffeine and alcohol. He is referred back by Chad Berry for follow-up of his mitral regurgitation because of a murmur which he thought was louder than he had remembered. Repeat 2D echo performed 11/14/2017 revealed moderate to severe MR with posterior leaflet prolapse, normal LV size and function. He is completely asymptomatic. He purchased his [id number] recently rode [place] 150 mph.  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    He does complain of increasing dyspnea over the last 6 to  12 months.  He had a transesophageal echo performed by Chad Berry 05/04/2020 revealing a flail P2 segment with severe MR.  He wishes to proceed with mini mitral repair with Chad Berry.   Current Meds  Medication Sig  . acetaminophen-codeine (TYLENOL #3) 300-30 MG tablet Take 1 tablet by mouth every 4 (four) hours as needed for pain.  [name] amoxicillin (AMOXIL) 500 MG capsule Take 500 mg by mouth every 6 (six) hours.  . Ascorbic Acid (VITAMIN C) 1000 MG tablet Take 1,000 mg by mouth daily.  . cephALEXin (KEFLEX) 500 MG capsule Take 1 capsule (500 mg total) by mouth 2 (two) times daily.  . Cholecalciferol (VITAMIN D) 50 MCG (2000 UT) tablet Take 2,000 Units by mouth daily.  [name] ezetimibe (ZETIA) 10 MG tablet Take 10 mg by mouth at bedtime.  [name] ibuprofen (ADVIL) 200 MG tablet Take 400 mg by mouth every 6 (six) hours as needed for headache (Sinus).  [name] levothyroxine (SYNTHROID) 75 MCG tablet Take 75 mcg by mouth at bedtime.  [name] losartan (COZAAR) 100 MG tablet Take 1 tablet (100 mg total) by mouth daily.  . rosuvastatin (CRESTOR) 40 MG tablet Take 40 mg by mouth at bedtime.     Allergies  Allergen Reactions  . Contrast Media [Iodinated Diagnostic Agents] Hives, Nausea And Vomiting and Cough    Pt will need a 13 hour prep before any contrast per Chad Berry (05/14/19)  . Hctz [Hydrochlorothiazide] Other (See Comments)    palpitations  . Metoprolol Succinate [Metoprolol] Other (See Comments)  Bradycardia      Social History   Socioeconomic History  . Marital status: Married    Spouse name: Not on file  . Number of children: Not on file  . Years of education: Not on file  . Highest education level: Not on file  Occupational History  . Not on file  Tobacco Use  . Smoking status: Former Smoker    Quit date: 12/23/1972    Years since quitting: 47.7  . Smokeless tobacco: Former Neurosurgeon    Types: Engineer, drilling  . Vaping Use: Never used  Substance and Sexual Activity  . Alcohol use: Yes     Alcohol/week: 0.0 standard drinks    Comment: occasionally  . Drug use: No  . Sexual activity: Not on file  Other Topics Concern  . 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  Cardiovascular: negative for chest pain, dyspnea on exertion, edema, orthopnea, palpitations, paroxysmal nocturnal dyspnea or shortness of breath Dermatological: negative for rash Respiratory: negative for cough or wheezing Urologic: negative for hematuria Abdominal: negative for nausea, vomiting, diarrhea, bright red blood per rectum, melena, or hematemesis Neurologic: negative for visual changes, syncope, or dizziness All other systems reviewed and are otherwise negative except as noted above.    Blood pressure 126/68, pulse (!) 59, height 5\' 11"  (1.803 m), weight 229 lb (103.9 kg).  General appearance: alert and no distress Neck: no adenopathy, no carotid bruit, no JVD, supple, symmetrical, trachea midline and thyroid not enlarged, symmetric, no tenderness/mass/nodules Lungs: clear to auscultation bilaterally Heart: 2-3 over 6 apical systolic murmur consistent with mitral gravitation. Extremities: extremities normal, atraumatic, no cyanosis or edema Pulses: 2+ and symmetric Skin: Skin color, texture, turgor normal. No rashes or lesions Neurologic: Alert and oriented X 3, normal strength and tone. Normal symmetric reflexes. Normal coordination and gait  EKG sinus bradycardia 59 with a nonspecific IVCD.  I personally reviewed  this EKG.  ASSESSMENT AND PLAN:   Essential hypertension History of essential pretension blood pressure measured today at 126/68.  He is on losartan.  Hyperlipidemia History of hyperlipidemia on statin therapy followed by his PCP.  Mitral regurgitation History of severe MR with TEE performed by Dr. 05/04/2020 revealing a flail P2 segment.  He has normal LV size and function with a mildly dilated left atrium.  He is symptomatic with dyspnea on exertion progressive over the last 6 months or so.  He recently had sinus surgery several weeks ago.  He wishes to proceed with right and left heart cath and mini mitral repair by Dr. 07/02/2020 in the near future.      Chad Moras MD FACP,FACC,FAHA, [facility] 08/25/2020 9:36 AM

## 2020-08-26 ENCOUNTER — Telehealth: Payer: Self-pay | Admitting: *Deleted

## 2020-08-26 NOTE — Telephone Encounter (Signed)
Pt contacted pre-catheterization scheduled at [facility] for: Monday Aug 30, 2020 9:30 AM Verified arrival time and place: [facility] Main Entrance [facility]) at: 7:30 AM  No solid food after midnight prior to cath, clear liquids until 5 AM day of procedure.  Contrast allergy: 13 hour Prednisone and Benadryl Prep: 08/29/20 Prednisone 50 mg 8 PM 08/30/20 Prednisone 50 mg 2 AM 08/30/20 Prednisone 50 mg and Bendaryl 50 mg just prior to leaving home for hospital AM of procedure  Except hold medication AM meds can be  taken pre-cath with sips of water including: ASA 81 mg   Confirmed patient has responsible adult to drive home post procedure and be with patient first 24 hours after arriving home:  You are allowed ONE visitor in the waiting room during the time you are at the hospital for your procedure. Both you and your visitor must wear a mask once you enter the hospital.   Call placed to patient to review procedure instructions, voicemail message mailbox full, unable to leave a message.

## 2020-08-27 ENCOUNTER — Other Ambulatory Visit (HOSPITAL_COMMUNITY)
Admission: RE | Admit: 2020-08-27 | Discharge: 2020-08-27 | Disposition: A | Discharge status: Home / Self Care | Payer: 59 | Source: Ambulatory Visit | Attending: Cardiovascular Disease | Admitting: Cardiovascular Disease

## 2020-08-27 DIAGNOSIS — Z01812 Encounter for preprocedural laboratory examination: Secondary | ICD-10-CM | POA: Insufficient documentation

## 2020-08-27 DIAGNOSIS — Z20822 Contact with and (suspected) exposure to covid-19: Secondary | ICD-10-CM | POA: Diagnosis not present

## 2020-08-28 LAB — SARS CORONAVIRUS 2 (TAT 6-24 HRS): SARS Coronavirus 2: NEGATIVE

## 2020-08-30 ENCOUNTER — Ambulatory Visit (HOSPITAL_COMMUNITY)
Admission: RE | Admit: 2020-08-30 | Discharge: 2020-08-30 | Disposition: A | Discharge status: Home / Self Care | Payer: 59 | Attending: Cardiovascular Disease | Admitting: Cardiovascular Disease

## 2020-08-30 ENCOUNTER — Encounter (HOSPITAL_COMMUNITY)
Admission: RE | Disposition: A | Discharge status: Home / Self Care | Payer: Self-pay | Source: Home / Self Care | Attending: Cardiovascular Disease

## 2020-08-30 ENCOUNTER — Other Ambulatory Visit: Payer: Self-pay

## 2020-08-30 ENCOUNTER — Encounter (HOSPITAL_COMMUNITY): Payer: Self-pay | Admitting: Cardiovascular Disease

## 2020-08-30 DIAGNOSIS — I10 Essential (primary) hypertension: Secondary | ICD-10-CM | POA: Diagnosis not present

## 2020-08-30 DIAGNOSIS — Z79890 Hormone replacement therapy (postmenopausal): Secondary | ICD-10-CM | POA: Diagnosis not present

## 2020-08-30 DIAGNOSIS — Z79899 Other long term (current) drug therapy: Secondary | ICD-10-CM | POA: Insufficient documentation

## 2020-08-30 DIAGNOSIS — E785 Hyperlipidemia, unspecified: Secondary | ICD-10-CM | POA: Insufficient documentation

## 2020-08-30 DIAGNOSIS — Z888 Allergy status to other drugs, medicaments and biological substances status: Secondary | ICD-10-CM | POA: Diagnosis not present

## 2020-08-30 DIAGNOSIS — I2510 Atherosclerotic heart disease of native coronary artery without angina pectoris: Secondary | ICD-10-CM | POA: Insufficient documentation

## 2020-08-30 DIAGNOSIS — Z91041 Radiographic dye allergy status: Secondary | ICD-10-CM | POA: Insufficient documentation

## 2020-08-30 DIAGNOSIS — I340 Nonrheumatic mitral (valve) insufficiency: Secondary | ICD-10-CM | POA: Diagnosis not present

## 2020-08-30 DIAGNOSIS — Z87891 Personal history of nicotine dependence: Secondary | ICD-10-CM | POA: Insufficient documentation

## 2020-08-30 HISTORY — PX: RIGHT/LEFT HEART CATH AND CORONARY ANGIOGRAPHY: CATH118266

## 2020-08-30 LAB — POCT I-STAT EG7
Acid-Base Excess: 0 mmol/L (ref 0.0–2.0)
Acid-base deficit: 1 mmol/L (ref 0.0–2.0)
Bicarbonate: 25.5 mmol/L (ref 20.0–28.0)
Bicarbonate: 25.7 mmol/L (ref 20.0–28.0)
Calcium, Ion: 1.24 mmol/L (ref 1.15–1.40)
Calcium, Ion: 1.25 mmol/L (ref 1.15–1.40)
HCT: 37 % — ABNORMAL LOW (ref 39.0–52.0)
HCT: 38 % — ABNORMAL LOW (ref 39.0–52.0)
Hemoglobin: 12.6 g/dL — ABNORMAL LOW (ref 13.0–17.0)
Hemoglobin: 12.9 g/dL — ABNORMAL LOW (ref 13.0–17.0)
O2 Saturation: 76 %
O2 Saturation: 77 %
Potassium: 4 mmol/L (ref 3.5–5.1)
Potassium: 4.1 mmol/L (ref 3.5–5.1)
Sodium: 142 mmol/L (ref 135–145)
Sodium: 143 mmol/L (ref 135–145)
TCO2: 27 mmol/L (ref 22–32)
TCO2: 27 mmol/L (ref 22–32)
pCO2, Ven: 46.3 mmHg (ref 44.0–60.0)
pCO2, Ven: 47.8 mmHg (ref 44.0–60.0)
pH, Ven: 7.338 (ref 7.250–7.430)
pH, Ven: 7.349 (ref 7.250–7.430)
pO2, Ven: 43 mmHg (ref 32.0–45.0)
pO2, Ven: 44 mmHg (ref 32.0–45.0)

## 2020-08-30 LAB — POCT I-STAT 7, (LYTES, BLD GAS, ICA,H+H)
Acid-base deficit: 1 mmol/L (ref 0.0–2.0)
Bicarbonate: 24.7 mmol/L (ref 20.0–28.0)
Calcium, Ion: 1.22 mmol/L (ref 1.15–1.40)
HCT: 36 % — ABNORMAL LOW (ref 39.0–52.0)
Hemoglobin: 12.2 g/dL — ABNORMAL LOW (ref 13.0–17.0)
O2 Saturation: 95 %
Potassium: 4.1 mmol/L (ref 3.5–5.1)
Sodium: 141 mmol/L (ref 135–145)
TCO2: 26 mmol/L (ref 22–32)
pCO2 arterial: 43.8 mmHg (ref 32.0–48.0)
pH, Arterial: 7.359 (ref 7.350–7.450)
pO2, Arterial: 79 mmHg — ABNORMAL LOW (ref 83.0–108.0)

## 2020-08-30 SURGERY — RIGHT/LEFT HEART CATH AND CORONARY ANGIOGRAPHY
Anesthesia: LOCAL

## 2020-08-30 MED ORDER — HEPARIN (PORCINE) IN NACL 1000-0.9 UT/500ML-% IV SOLN
INTRAVENOUS | Status: AC
  Filled 2020-08-30: qty 1000

## 2020-08-30 MED ORDER — FENTANYL CITRATE (PF) 100 MCG/2ML IJ SOLN
INTRAMUSCULAR | Status: DC | PRN
  Administered 2020-08-30: 25 ug via INTRAVENOUS

## 2020-08-30 MED ORDER — DIPHENHYDRAMINE HCL 25 MG PO CAPS
ORAL_CAPSULE | ORAL | Status: AC
  Administered 2020-08-30: 50 mg via ORAL
  Filled 2020-08-30: qty 2

## 2020-08-30 MED ORDER — VERAPAMIL HCL 2.5 MG/ML IV SOLN
INTRA_ARTERIAL | Status: DC | PRN
  Administered 2020-08-30: 5 mL via INTRA_ARTERIAL

## 2020-08-30 MED ORDER — VERAPAMIL HCL 2.5 MG/ML IV SOLN
INTRAVENOUS | Status: AC
  Filled 2020-08-30: qty 2

## 2020-08-30 MED ORDER — SODIUM CHLORIDE 0.9 % WEIGHT BASED INFUSION: 1.0000 mL/kg/h | INTRAVENOUS | Status: DC

## 2020-08-30 MED ORDER — SODIUM CHLORIDE 0.9% FLUSH: 3.0000 mL | INTRAVENOUS | Status: DC | PRN

## 2020-08-30 MED ORDER — LIDOCAINE HCL (PF) 1 % IJ SOLN
INTRAMUSCULAR | Status: DC | PRN
Start: 2020-08-30 — End: 2020-08-30
  Administered 2020-08-30: 5 mL

## 2020-08-30 MED ORDER — FENTANYL CITRATE (PF) 100 MCG/2ML IJ SOLN
INTRAMUSCULAR | Status: AC
  Filled 2020-08-30: qty 2

## 2020-08-30 MED ORDER — ASPIRIN 81 MG PO CHEW
81.0000 mg | CHEWABLE_TABLET | ORAL | Status: AC
  Administered 2020-08-30: 81 mg via ORAL

## 2020-08-30 MED ORDER — HEPARIN SODIUM (PORCINE) 1000 UNIT/ML IJ SOLN
INTRAMUSCULAR | Status: AC
  Filled 2020-08-30: qty 1

## 2020-08-30 MED ORDER — MIDAZOLAM HCL 2 MG/2ML IJ SOLN
INTRAMUSCULAR | Status: AC
  Filled 2020-08-30: qty 2

## 2020-08-30 MED ORDER — SODIUM CHLORIDE 0.9 % IV SOLN: 250.0000 mL | INTRAVENOUS | Status: DC | PRN

## 2020-08-30 MED ORDER — HEPARIN SODIUM (PORCINE) 1000 UNIT/ML IJ SOLN
INTRAMUSCULAR | Status: DC | PRN
  Administered 2020-08-30: 5000 [IU] via INTRAVENOUS

## 2020-08-30 MED ORDER — NITROGLYCERIN 1 MG/10 ML FOR IR/CATH LAB
INTRA_ARTERIAL | Status: AC
  Filled 2020-08-30: qty 10

## 2020-08-30 MED ORDER — IOHEXOL 350 MG/ML SOLN
INTRAVENOUS | Status: DC | PRN
Start: 2020-08-30 — End: 2020-08-30
  Administered 2020-08-30: 45 mL via INTRA_ARTERIAL

## 2020-08-30 MED ORDER — MIDAZOLAM HCL 2 MG/2ML IJ SOLN
INTRAMUSCULAR | Status: DC | PRN
  Administered 2020-08-30: 1 mg via INTRAVENOUS

## 2020-08-30 MED ORDER — HEPARIN (PORCINE) IN NACL 1000-0.9 UT/500ML-% IV SOLN
INTRAVENOUS | Status: DC | PRN
  Administered 2020-08-30 (×2): 500 mL

## 2020-08-30 MED ORDER — ASPIRIN 81 MG PO CHEW
81.0000 mg | CHEWABLE_TABLET | ORAL | Status: DC
  Filled 2020-08-30: qty 1

## 2020-08-30 MED ORDER — DIPHENHYDRAMINE HCL 25 MG PO CAPS: 50.0000 mg | ORAL_CAPSULE | Freq: Once | ORAL | Status: AC

## 2020-08-30 MED ORDER — LIDOCAINE HCL (PF) 1 % IJ SOLN
INTRAMUSCULAR | Status: AC
  Filled 2020-08-30: qty 30

## 2020-08-30 MED ORDER — SODIUM CHLORIDE 0.9 % WEIGHT BASED INFUSION
3.0000 mL/kg/h | INTRAVENOUS | Status: AC
  Administered 2020-08-30: 3 mL/kg/h via INTRAVENOUS

## 2020-08-30 SURGICAL SUPPLY — 13 items
CATH INFINITI 5FR ANG PIGTAIL (CATHETERS) ×2 IMPLANT
CATH OPTITORQUE TIG 4.0 5F (CATHETERS) ×2 IMPLANT
CATH SWAN DBL LUMAN 5F 110 (CATHETERS) ×2 IMPLANT
DEVICE RAD COMP TR BAND LRG (VASCULAR PRODUCTS) ×2 IMPLANT
GLIDESHEATH SLEND A-KIT 6F 22G (SHEATH) ×2 IMPLANT
GUIDEWIRE INQWIRE 1.5J.035X260 (WIRE) ×1 IMPLANT
INQWIRE 1.5J .035X260CM (WIRE) ×2
KIT HEART LEFT (KITS) ×2 IMPLANT
PACK CARDIAC CATHETERIZATION (CUSTOM PROCEDURE TRAY) ×2 IMPLANT
SHEATH GLIDE SLENDER 4/5FR (SHEATH) ×2 IMPLANT
TRANSDUCER W/STOPCOCK (MISCELLANEOUS) ×2 IMPLANT
TUBING CIL FLEX 10 FLL-RA (TUBING) ×2 IMPLANT
WIRE HI TORQ VERSACORE-J 145CM (WIRE) ×2 IMPLANT

## 2020-08-30 NOTE — Discharge Instructions (Signed)
Radial Site Care  This sheet gives you information about how to care for yourself after your procedure. Your health care provider may also give you more specific instructions. If you have problems or questions, contact your health care provider. What can I expect after the procedure? After the procedure, it is common to have:  Bruising and tenderness at the catheter insertion area. Follow these instructions at home: Medicines  Take over-the-counter and prescription medicines only as told by your health care provider. Insertion site care 1. Follow instructions from your health care provider about how to take care of your insertion site. Make sure you: ? Wash your hands with soap and water before you remove your bandage (dressing). If soap and water are not available, use hand sanitizer. ? May remove dressing in 24 hours. 2. Check your insertion site every day for signs of infection. Check for: ? Redness, swelling, or pain. ? Fluid or blood. ? Pus or a bad smell. ? Warmth. 3. Do no take baths, swim, or use a hot tub for 5 days. 4. You may shower 24-48 hours after the procedure. ? Remove the dressing and gently wash the site with plain soap and water. ? Pat the area dry with a clean towel. ? Do not rub the site. That could cause bleeding. 5. Do not apply powder or lotion to the site. Activity  1. For 24 hours after the procedure, or as directed by your health care provider: ? Do not flex or bend the affected arm. ? Do not push or pull heavy objects with the affected arm. ? Do not drive yourself home from the hospital or clinic. You may drive 24 hours after the procedure. ? Do not operate machinery or power tools. ? KEEP ARM ELEVATED THE REMAINDER OF THE DAY. 2. Do not push, pull or lift anything that is heavier than 10 lb for 5 days. 3. Ask your health care provider when it is okay to: ? Return to work or school. ? Resume usual physical activities or sports. ? Resume sexual  activity. General instructions  If the catheter site starts to bleed, raise your arm and put firm pressure on the site. If the bleeding does not stop, get help right away. This is a medical emergency.  DRINK PLENTY OF FLUIDS FOR THE NEXT 2-3 DAYS.  No alcohol consumption for 24 hours after receiving sedation.  If you went home on the same day as your procedure, a responsible adult should be with you for the first 24 hours after you arrive home.  Keep all follow-up visits as told by your health care provider. This is important. Contact a health care provider if:  You have a fever.  You have redness, swelling, or yellow drainage around your insertion site. Get help right away if:  You have unusual pain at the radial site.  The catheter insertion area swells very fast.  The insertion area is bleeding, and the bleeding does not stop when you hold steady pressure on the area.  Your arm or hand becomes pale, cool, tingly, or numb. These symptoms may represent a serious problem that is an emergency. Do not wait to see if the symptoms will go away. Get medical help right away. Call your local emergency services (911 in the [place] Do not drive yourself to the hospital. Summary  After the procedure, it is common to have bruising and tenderness at the site.  Follow instructions from your health care provider about how to take care   of your radial site wound. Check the wound every day for signs of infection.  This information is not intended to replace advice given to you by your health care provider. Make sure you discuss any questions you have with your health care provider. Document Revised: 05/23/2017 Document Reviewed: 05/23/2017 Elsevier Patient Education  2020 Elsevier Inc. 

## 2020-08-30 NOTE — Progress Notes (Signed)
Pt ambulated without difficulty or bleeding.   Arm board applied to (R) arm.  Discharged home with wife who will drive and stay with pt x 24 hrs

## 2020-08-30 NOTE — Research (Signed)
IDENTIFY-PH Informed Consent   Subject Name: Chad Berry  Subject met inclusion and exclusion criteria.  The informed consent form, study requirements and expectations were reviewed with the subject and questions and concerns were addressed prior to the signing of the consent form.  The subject verbalized understanding of the trail requirements.  The subject agreed to participate in the IDENTIIFY-PH trial and signed the informed consent.  The informed consent was obtained prior to performance of any protocol-specific procedures for the subject.  A copy of the signed informed consent was given to the subject and a copy was placed in the subject's medical record.  [name]. 08/30/2020, 07:40 AM

## 2020-08-30 NOTE — Progress Notes (Signed)
Chaplain responded to code stemi. Wife outside cath lab in waiting room. Chaplain offered emotional support and wife will ask nurse to contact chaplain when needed.    08/30/20 1100  Clinical Encounter Type  Visited With Family;Patient not available  Visit Type Code  Referral From Other (Comment)  Consult/Referral To Chaplain  Spiritual Encounters  Spiritual Needs Emotional

## 2020-08-30 NOTE — Interval H&P Note (Signed)
Cath Lab Visit (complete for each Cath Lab visit)  Clinical Evaluation Leading to the Procedure:   ACS: No.  Non-ACS:    Anginal Classification: No Symptoms  Anti-ischemic medical therapy: No Therapy  Non-Invasive Test Results: No non-invasive testing performed  Prior CABG: No previous CABG      History and Physical Interval Note:  08/30/2020 8:26 AM  Chad Berry  has presented today for surgery, with the diagnosis of mitral valve reg.  The various methods of treatment have been discussed with the patient and family. After consideration of risks, benefits and other options for treatment, the patient has consented to  Procedure(s): RIGHT/LEFT HEART CATH AND CORONARY ANGIOGRAPHY (N/A) as a surgical intervention.  The patient's history has been reviewed, patient examined, no change in status, stable for surgery.  I have reviewed the patient's chart and labs.  Questions were answered to the patient's satisfaction.     Nanetta Batty

## 2020-09-02 ENCOUNTER — Encounter (INDEPENDENT_AMBULATORY_CARE_PROVIDER_SITE_OTHER): Payer: 59 | Admitting: Otolaryngology

## 2020-09-02 ENCOUNTER — Telehealth: Payer: Self-pay | Admitting: Cardiovascular Disease

## 2020-09-02 ENCOUNTER — Ambulatory Visit (INDEPENDENT_AMBULATORY_CARE_PROVIDER_SITE_OTHER): Payer: 59 | Admitting: Otolaryngology

## 2020-09-02 ENCOUNTER — Encounter (INDEPENDENT_AMBULATORY_CARE_PROVIDER_SITE_OTHER): Payer: Self-pay | Admitting: Otolaryngology

## 2020-09-02 ENCOUNTER — Other Ambulatory Visit: Payer: Self-pay

## 2020-09-02 VITALS — Temp 97.0°F

## 2020-09-02 DIAGNOSIS — Z4889 Encounter for other specified surgical aftercare: Secondary | ICD-10-CM

## 2020-09-02 NOTE — Telephone Encounter (Signed)
Patient called wanting to speak with a nurse in regards to the producer he had on Monday and the pain he is feeling. Please advise

## 2020-09-02 NOTE — Progress Notes (Signed)
HPI: Chad Berry is a 80 y.o. male who presents 3 weeks days s/p left sphenoidotomy for fungal sphenoid sinusitis.  He is doing well..   Past Medical History:  Diagnosis Date  . Aortic atherosclerosis (HCC)   . Bradycardia    while on BB with HR to the 40s  . Carotid stenosis    CAROTID DOPPLER, 12/07/2011 - Mild arthrosclerotic changes, no high-grade stenosis  . Cataract    rt. eye  . Clostridium difficile infection 05/2019  . Diverticulitis   . Heart murmur   . History of echocardiogram    Echo 9/16:  EF 60-65%, no RWMA, Gr 1 DD, holosystolic MVP of posterior leaflet, mild MR, LA upper limits of normal, normal RVF, mod TR, PASP 50 mmHg  . Hypercholesteremia   . Hyperlipidemia   . Hypertension   . Hypothyroidism   . MVP (mitral valve prolapse)    a. Echo 12/07/2011 - EF-65-70%, severe mitral regurgitation, moderate-severe tricuspid regurgitation, moderate pulmonary HTN, moderate pulmonic regurgitation;  b. 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 Prolapsed internal hemorrhoids, grade 3 02/14/2017  . PVC (premature ventricular contraction)   . Rapid palpitations    event monitor with short bursts of SVT  . Renal cyst   . Testicular hypofunction   . Vitamin D deficiency    Past Surgical History:  Procedure Laterality Date  . COLONOSCOPY    . ETHMOIDECTOMY Left 08/13/2020   Procedure: SPENOIDECTOMY AND ETHMOIDECTOMY;  Surgeon: Drema Halon, MD;  Location: [facility] OR;  Service: ENT;  Laterality: Left;  . RIGHT/LEFT HEART CATH AND CORONARY ANGIOGRAPHY N/A 08/30/2020   Procedure: RIGHT/LEFT HEART CATH AND CORONARY ANGIOGRAPHY;  Surgeon: Runell Gess, MD;  Location: [facility] INVASIVE CV LAB;  Service: Cardiovascular;  Laterality: N/A;  . SIGMOIDOSCOPY  02/14/2017  . SINUS ENDO WITH FUSION Left 08/13/2020    Procedure: SINUS ENDO WITH FUSION;  Surgeon: Drema Halon, MD;  Location: [facility] OR;  Service: ENT;  Laterality: Left;  . TEE WITHOUT CARDIOVERSION N/A 05/04/2020   Procedure: TRANSESOPHAGEAL ECHOCARDIOGRAM (TEE);  Surgeon: Little Ishikawa, MD;  Location: [facility] ENDOSCOPY;  Service: Cardiovascular;  Laterality: N/A;   Social History   Socioeconomic History  . Marital status: Married    Spouse name: Not on file  . Number of children: Not on file  . Years of education: Not on file  . Highest education level: Not on file  Occupational History  . Not on file  Tobacco Use  . Smoking status: Former Smoker    Quit date: 12/23/1972    Years since quitting: 47.7  . Smokeless tobacco: Former Neurosurgeon    Types: Engineer, drilling  . Vaping Use: Never used  Substance and Sexual Activity  . Alcohol use: Yes    Alcohol/week: 0.0 standard drinks    Comment: occasionally  . Drug use: No  . Sexual activity: Not on file  Other Topics Concern  . 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 Stroke Mother 57  . Heart failure Father   . Diverticulitis Sister   . Stroke Brother 48  . Heart attack Brother   . Colon cancer Neg Hx   . Esophageal cancer Neg Hx   . Stomach cancer Neg Hx   . Rectal cancer Neg Hx    Allergies  Allergen Reactions  . Contrast Media [Iodinated Diagnostic Agents] Hives, Nausea And Vomiting and Cough    Pt will need a 13 hour prep before any contrast per Dr. Paulina Fusi (05/14/19)  . Hctz [Hydrochlorothiazide] Other (See Comments)    palpitations  . Metoprolol  Succinate [Metoprolol] Other (See Comments)    Bradycardia     Prior to Admission medications   Medication Sig Start Date End Date Taking? Authorizing Provider  acetaminophen-codeine (TYLENOL #3) 300-30 MG tablet Take 1 tablet by mouth every 4 (four) hours as needed for pain. 08/02/20   [provider]  amoxicillin (AMOXIL) 500 MG capsule Take 500 mg by mouth every 6 (six) hours. 08/02/20   [provider]  Ascorbic Acid (VITAMIN C) 1000 MG tablet Take 1,000 mg by mouth daily.    [provider]  cephALEXin (KEFLEX) 500 MG capsule Take 1 capsule (500 mg total) by mouth 2 (two) times daily. 08/13/20   Drema Halon, MD  Cholecalciferol (VITAMIN D) 50 MCG (2000 UT) tablet Take 2,000 Units by mouth daily.    [provider]  ezetimibe (ZETIA) 10 MG tablet Take 10 mg by mouth at bedtime.    [provider]  ibuprofen (ADVIL) 200 MG tablet Take 400 mg by mouth every 6 (six) hours as needed for headache (Sinus).    [provider]  levothyroxine (SYNTHROID) 75 MCG tablet Take 75 mcg by mouth at bedtime.    [provider]  losartan (COZAAR) 100 MG tablet Take 1 tablet (100 mg total) by mouth daily. 06/09/15   Azalee Course, PA  predniSONE (DELTASONE) 50 MG tablet Take one 50mg  tablet 13 hours prior to procedure. Take one 50mg  tablet 7 hours prior to procedure. Take one 50mg  tablet 1 hour prior to procedure. 08/25/20   , MD  rosuvastatin (CRESTOR) 40 MG tablet Take 40 mg by mouth at bedtime.    [provider]     Physical Exam: He has scabbing crusting of the sphenoidotomy that was removed in the office today.  He still has some granulation tissue with minimal mucus discharge.   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  Plan: He will continue with the saline rinses. Also recommended restarting the Flonase 2 sprays each nostril at night. He will follow-up in 3 weeks for  recheck.   , MD

## 2020-09-02 NOTE — Telephone Encounter (Signed)
Reports last night he felt a "tightness" in his chest, the same as he felt during his heart cath that lasted less than one second.  Reports feeling tired yesterday, had palpiations and was unable to sleep. Reports currently taking prednisone. Says all symptoms have stopped. Says he lives in [place] and was in [place] for ENT appointment and thought he would call so he could stop by office to be seen. Denies, chest pain, dob or dizziness, palpitations. Advised that he should continue monitoring symptoms and if they get worse, to go to the ED for an evaluation. Advised that message would be sent to provider. Verbalized understanding.

## 2020-09-06 ENCOUNTER — Encounter: Payer: Self-pay | Admitting: *Deleted

## 2020-09-06 ENCOUNTER — Other Ambulatory Visit: Payer: Self-pay | Admitting: *Deleted

## 2020-09-06 ENCOUNTER — Other Ambulatory Visit: Payer: Self-pay

## 2020-09-06 ENCOUNTER — Ambulatory Visit: Payer: 59 | Admitting: Thoracic Surgery (Cardiothoracic Vascular Surgery)

## 2020-09-06 ENCOUNTER — Encounter: Payer: Self-pay | Admitting: Thoracic Surgery (Cardiothoracic Vascular Surgery)

## 2020-09-06 ENCOUNTER — Ambulatory Visit (INDEPENDENT_AMBULATORY_CARE_PROVIDER_SITE_OTHER): Payer: 59 | Admitting: Thoracic Surgery (Cardiothoracic Vascular Surgery)

## 2020-09-06 VITALS — BP 129/80 | HR 69 | Resp 20 | Ht 71.0 in | Wt 227.0 lb

## 2020-09-06 DIAGNOSIS — I340 Nonrheumatic mitral (valve) insufficiency: Secondary | ICD-10-CM

## 2020-09-06 DIAGNOSIS — I341 Nonrheumatic mitral (valve) prolapse: Secondary | ICD-10-CM

## 2020-09-06 NOTE — Progress Notes (Addendum)
[address]       [name] [address]             [phone]     CARDIOTHORACIC SURGERY OFFICE NOTE  Referring Provider is Runell Gess, MD PCP is Mosetta Putt, MD   HPI:  Patient is 80 year old moderately overweight male with history of mitral valve prolapse, mitral regurgitation, hypertension, hyperlipidemia, hypothyroidism, and asymptomatic cerebrovascular disease who returns to the office today to further discuss treatment options for management of mitral valve prolapse with severe mitral regurgitation.  He was initially seen in consultation on May 10, 2020.  At that time his primary complaint was that of headaches and other symptoms related to chronic sinusitis.  He was not experiencing significant exertional shortness of breath although he did complain of fatigue.  Since then he underwent sinus surgery by Dr. Ezzard Standing on August 13, 2020.  Symptoms of headaches and sinusitis have resolved and he has been seen in the office by Dr. Ezzard Standing on 2 occasions, most recently Sep 02, 2020 at which time he was doing well.  He is still having some pain in his upper teeth on the same side as his surgery.  He has also been seen in follow-up by Dr. Gery Pray and he underwent routine left and right heart catheterization on Aug 30, 2020.  Catheterization revealed mild nonobstructive coronary artery disease with normal right heart pressures.  Patient returns to our office today to further discuss whether or not to proceed with elective mitral valve repair in the near future.  Patient states that he feels as though his mitral valve is getting worse.  He has started to notice that he gets short of breath walking up a flight of stairs.  He does not get short of breath with ordinary activity and he can walk 2 miles on flat ground without any shortness of breath.  He does complain of chronic fatigue.  He has not had any chest pain or chest tightness.  He has not had any resting shortness  of breath, PND, orthopnea, or lower extremity edema.  He has not had dizziness, or syncope.  He still has occasional palpitations and brief episodes of atypical chest discomfort.  He has not had any fevers or chills.  He denies productive cough.   Current Outpatient Medications  Medication Sig Dispense Refill  . acetaminophen-codeine (TYLENOL #3) 300-30 MG tablet Take 1 tablet by mouth every 4 (four) hours as needed for pain.    . Ascorbic Acid (VITAMIN C) 1000 MG tablet Take 1,000 mg by mouth daily.    . cephALEXin (KEFLEX) 500 MG capsule Take 1 capsule (500 mg total) by mouth 2 (two) times daily. 20 capsule 0  . Cholecalciferol (VITAMIN D) 50 MCG (2000 UT) tablet Take 2,000 Units by mouth daily.    [name] ezetimibe (ZETIA) 10 MG tablet Take 10 mg by mouth at bedtime.    [name] ibuprofen (ADVIL) 200 MG tablet Take 400 mg by mouth every 6 (six) hours as needed for headache (Sinus).    [name] levothyroxine (SYNTHROID) 75 MCG tablet Take 75 mcg by mouth at bedtime.    [name] losartan (COZAAR) 100 MG tablet Take 1 tablet (100 mg total) by mouth daily. 90 tablet 3  . rosuvastatin (CRESTOR) 40 MG tablet Take 40 mg by mouth at bedtime.     Current Facility-Administered Medications  Medication Dose Route Frequency Provider Last Rate Last Admin  . sodium chloride flush (NS) 0.9 % injection  3 mL  3 mL Intravenous Q12H Runell Gess, MD          Physical Exam:   BP 129/80 (BP Location: Right Arm, Patient Position: Sitting)   Pulse 69   Resp 20   Ht  (1.803 m)   Wt 227 lb (103 kg)   SpO2 95% Comment: RA  BMI 31.66 kg/m   General:  Well-appearing  Chest:   Clear to auscultation with symmetrical breath sounds  CV:   Regular rate and rhythm with grade 3/6 systolic murmur heard best at the apex  Incisions:  n/a  Abdomen:  Soft nontender  Extremities:  Warm and well-perfused  Diagnostic Tests:  CT ANGIOGRAPHY CHEST, ABDOMEN AND PELVIS  TECHNIQUE: Multidetector CT imaging through the chest, abdomen  and pelvis was performed using the standard protocol during bolus administration of intravenous contrast. Multiplanar reconstructed images and MIPs were obtained and reviewed to evaluate the vascular anatomy.  CONTRAST:  75mL ISOVUE-370 IOPAMIDOL (ISOVUE-370) INJECTION 76%  COMPARISON:  None.  FINDINGS: CTA CHEST FINDINGS  Cardiovascular: Diffuse coronary artery calcifications are noted. The heart size is within normal limits.  Mediastinum/Nodes: Incidentally noted 11 mm left thyroid nodule. No enlarged hilar, mediastinal, supraclavicular, or axillary lymph nodes.  Lungs/Pleura: Minimal bibasilar atelectasis.  Musculoskeletal: No chest wall abnormality. No acute or significant osseous findings.  Vasculature: Evaluation of the ascending thoracic aorta is limited due to cardiac motion. 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 No aneurysm of the ascending thoracic aorta, however evaluation is limited due to cardiac motion.  The left vertebral artery originates directly from the aortic arch. 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  No dilatation of the aortic arch or descending thoracic aorta. No aortic dissection.  Review of the MIP images confirms the above findings.  CTA ABDOMEN AND PELVIS FINDINGS  VASCULAR  Aorta: Mild scattered calcified plaque without aneurysmal dilatation or dissection.  Celiac: Patent without evidence of aneurysm, dissection, vasculitis or significant stenosis.  SMA: Patent without evidence of aneurysm, dissection, vasculitis or significant stenosis.  Renals: Both renal arteries are patent without evidence of aneurysm, dissection, vasculitis, fibromuscular dysplasia or significant stenosis.  IMA: Patent without evidence of aneurysm, dissection, vasculitis or significant stenosis.  Inflow: Patent without evidence  of aneurysm, dissection, vasculitis or significant stenosis.  Veins: No obvious venous abnormality within the limitations of this arterial phase study.  Review of the MIP images confirms the above findings.  NON-VASCULAR  Hepatobiliary: No focal liver abnormality is seen. No gallstones, gallbladder wall thickening, or biliary dilatation.  Pancreas: Unremarkable. No pancreatic ductal dilatation or surrounding inflammatory changes.  Spleen: Normal in size without focal abnormality.  Adrenals/Urinary Tract: 2 left renal simple cysts with the larger measuring 7.4 cm in diameter. 9 mm simple cyst present at the upper pole of the right kidney. Adrenal glands are normal in appearance.  Stomach/Bowel: Small hiatal hernia is present. Extensive sigmoid and descending colon diverticulosis without evidence of acute diverticulitis. Normal appendix. No bowel dilatation to indicate ileus or obstruction.  Lymphatic: No enlarged abdominal or pelvic lymph nodes.  Reproductive: Prostate is mildly enlarged.  Other: None  Musculoskeletal: No acute or significant osseous findings.  Review of the MIP images confirms the above findings.  IMPRESSION: 1. No aortic dissection identified. The evaluation of the aortic root and ascending thoracic aorta is limited due to cardiac motion. 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 This measurement is limited due to motion. 2. No aortic dissection or aneurysm.   Electronically Signed   By: Acquanetta Belling M.D.   On: 05/11/2020 09:23       RIGHT/LEFT HEART CATH AND CORONARY ANGIOGRAPHY    Conclusion    Prox RCA to Mid RCA lesion is 30% stenosed.  Mid LAD lesion is 30% stenosed.  Hemodynamic findings consistent with mitral valve regurgitation.   Raquon Milledge is a 80 y.o. male    [id number] LOCATION:  FACILITY: [facility]  PHYSICIAN: Nanetta Batty, M.D. 01-Aug-1940   DATE OF PROCEDURE:   08/30/2020  DATE OF DISCHARGE:     CARDIAC CATHETERIZATION     History obtained from chart review.Justis Ritchis a 80 y.o.moderatelyoverweight, married Caucasian male, father of 2, grandfather to 2 grandchildren whose son [name] is also a patient of mine, as is his wife. I saw him1/28/2022. He was worked up for palpitations and had an echo that showed severe MR with mild mitral valve prolapse and mild LV size and function. His other problems include hypertension and hyperlipidemia. He lives an active lifestyle and is actually heading out to [place] to go coyote hunting for a week. He has changed his diet and has lost 15 pounds. He has cut out caffeine and alcohol. He has had 2 negative sleep studies. He is otherwise asymptomatic. Since I saw him several years ago he has seen Tereso Newcomer in the office for evaluation of palpitations. A 2-D echo revealed normal LV function with holosystolic mitral valve prolapse, mild MR and moderate TR. A 48 hour Holter monitor showed PVCs, short runs of nonsustained ventricular tachycardia and short runs of SVT. Since stopping his diuretic his palpitations have improved.  I did refer him to Dr. Ladona Ridgel who recommended conservative therapy noting he had PACs and PVCs. He recommended avoidance of caffeine and alcohol. He is referred back by Dr. Duaine Dredge for follow-up of his mitral regurgitation because of a murmur which he thought was louder than he had remembered. Repeat 2D echo performed 11/14/2017 revealed moderate to severe MR with posterior leaflet prolapse, normal LV size and function. He is completely asymptomatic. He purchased his [id number] recently rode [place] the [place] 150 mph.  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 He had a transesophageal echo performed by Dr.  Schumann1/07/2020 revealing a flail P2 segment with severe MR. He wishes to proceed with mini mitral repair with Dr. Cornelius Moras.   IMPRESSION: Mr. 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  He is a great candidate for a "mini mitral" repair for severe MR and normal LV function.  The radial sheath was removed and a TR band was placed on the right wrist to achieve patent hemostasis.  The patient left lab in stable condition.  He will be discharged home later today as an outpatient and will see me back in the office next week for follow-up.  Nanetta Batty. MD, [facility] 08/30/2020 9:27 AM      Surgeon Notes    08/18/2020 2:31 PM Operative Note signed by Drema Halon, MD    08/13/2020 12:11 PM Brief Op Note signed by Drema Halon, MD    Indications  Severe mitral regurgitation [I34.0 (ICD-10-CM)]   Procedural Details  Technical  Details PROCEDURE DESCRIPTION:   The patient was brought to the second floor [facility] cath lab in the postabsorptive state. He was premedicated with IV Versed and fentanyl.  He was premedicated for contrast allergy prophylaxis with prednisone and Benadryl.  His right wristwas prepped and shaved in usual sterile fashion. Xylocaine 1% was used for local anesthesia. A 6 French sheath was inserted into the right radial artery using standard Seldinger technique. The patient received 5000 units  of heparin intravenously.  A 5 [place] TIG catheter and pigtail catheter were used for selective coronary angiography and obtain left heart pressures.  A 5 French sheath was inserted into the right antecubital vein.  A 5 French balloontipped Swan-Ganz catheter was then advanced through the right heart chambers obtaining sequential pressures and pulmonary artery blood samples for the determination of Fick cardiac output.  Isovue dye is used for the entirety of the case.  Retrograde aortic, ventricular and pullback pressures were recorded.   Radial cocktail was administered via the SideArm sheath.  Estimated blood loss <50 mL.   During this procedure medications were administered to achieve and maintain moderate conscious sedation while the patient's heart rate, blood pressure, and oxygen saturation were continuously monitored and I was present face-to-face 100% of this time.   Medications (Filter: Administrations occurring from [id number] to 0926 on 08/30/20)  Heparin (Porcine) in NaCl 1000-0.9 UT/500ML-% SOLN (mL) Total volume:  1,000 mL  Date/Time Rate/Dose/Volume Action   08/30/20 0838 500 mL Given   0838 500 mL Given    fentaNYL (SUBLIMAZE) injection (mcg) Total dose:  25 mcg  Date/Time Rate/Dose/Volume Action   08/30/20 0849 25 mcg Given    midazolam (VERSED) injection (mg) Total dose:  1 mg  Date/Time Rate/Dose/Volume Action   08/30/20 0849 1 mg Given    lidocaine (PF) (XYLOCAINE) 1 % injection (mL) Total volume:  5 mL  Date/Time Rate/Dose/Volume Action   08/30/20 0901 5 mL Given    Radial Cocktail (Verapamil 2.5 mg, NTG, Lidocaine) (mL) Total volume:  5 mL  Date/Time Rate/Dose/Volume Action   08/30/20 0905 5 mL Given    heparin sodium (porcine) injection (Units) Total dose:  5,000 Units  Date/Time Rate/Dose/Volume Action   08/30/20 0908 5,000 Units Given    iohexol (OMNIPAQUE) 350 MG/ML injection (mL) Total volume:  45 mL  Date/Time Rate/Dose/Volume Action   08/30/20 0923 45 mL Given     Sedation Time  Sedation Time Physician-1: 24 minutes   Contrast  Medication Name Total Dose  iohexol (OMNIPAQUE) 350 MG/ML injection 45 mL    Radiation/Fluoro  Fluoro time: 3.9 (min) DAP: 56068 (mGycm2) Cumulative Air Kerma: 781 (mGy)   Coronary Findings   Diagnostic Dominance: Right  Left Anterior Descending  Mid LAD lesion is 30% stenosed.  Right Coronary Artery  Prox RCA to Mid RCA lesion is 30% stenosed.   Intervention   No interventions have been documented.  Right  Heart  Right Heart Pressures Hemodynamic findings consistent with mitral valve regurgitation. Right atrial pressure- 10/0 Right ventricular pressure- 37/3 Pulmonary artery pressure-39/10, mean 24 Pulmonary wedge pressure-A-wave 17, V wave 20, mean 13 LVEDP- 14 Cardiac output- 8.6 L/min with an index of 3.9 L/min/m.   Coronary Diagrams   Diagnostic Dominance: Right    Intervention    Implants    No implant documentation for this case.    Syngo Images  Show images for CARDIAC CATHETERIZATION  Images on Long Term Storage  Show images for Louanne BeltonRitch, Lianne BushyJohn  Link to  Procedure Log  Procedure Log     Hemo Data  Flowsheet Row Most Recent Value  Fick Cardiac Output 8.57 L/min  Fick Cardiac Output Index 3.88 (L/min)/BSA  RV Systolic Pressure 37 mmHg  RV Diastolic Pressure 3 mmHg  RV EDP 10 mmHg  PA Systolic Pressure 39 mmHg  PA Diastolic Pressure 10 mmHg  PA Mean 24 mmHg  PW A Wave 17 mmHg  PW V Wave 20 mmHg  PW Mean 13 mmHg  AO Systolic Pressure 134 mmHg  AO Diastolic Pressure 67 mmHg  AO Mean 95 mmHg  LV Systolic Pressure 154 mmHg  LV Diastolic Pressure 7 mmHg  LV EDP 14 mmHg  AOp Systolic Pressure 151 mmHg  AOp Diastolic Pressure 71 mmHg  AOp Mean Pressure 104 mmHg  LVp Systolic Pressure 149 mmHg  LVp Diastolic Pressure 6 mmHg  LVp EDP Pressure 15 mmHg  QP/QS 1  TPVR Index 6.19 HRUI  TSVR Index 1.29 HRUI  TPVR/TSVR Ratio 4.8      Impression:  Patient has mitral valve prolapse with at least stage C and possibly early stage D severe symptomatic primary mitral regurgitation.    He complains of increasing fatigue and he has noticed a tendency to get short of breath if walking up a flight of stairs.  I have personally reviewed the patient's recent transthoracic and transesophageal echocardiograms, CT angiograms, and diagnostic cardiac catheterization.    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  There is an eccentric jet of regurgitation.  Quantification of mitral regurgitation using PISA confirmed findings consistent with severe mitral regurgitation.  Left ventricular size and systolic function appears normal.  There is mild left atrial enlargement.  No other significant abnormalities are noted.  Catheterization is notable for the absence of significant coronary artery disease and reveals normal right heart pressures.  CT angiography revealed no contraindications to peripheral cannulation for surgery.  Options at this time include continued close follow-up on medical therapy versus elective mitral valve repair.  Based upon review of the patient's transesophageal echocardiogram I would anticipate greater than 95% likelihood of successful and durable mitral valve repair with low operative risks.      Plan:  The patient was counseled at length regarding his diagnosis of severe primary mitral regurgitation.  We reviewed the results of their diagnostic tests including images from the most recent echocardiograms, catheterization and CTA.  We discussed the natural history of mitral regurgitation as well as alternative treatment strategies.  We discussed the impact of his age, current state of health, and any significant comorbid medical problems on clinical decision making.  We went on to discuss the indications, risks and potential benefits of mitral valve repair as well as the timing of surgical intervention.  The rationale for elective surgery has been explained, including a comparison between surgery and continued medical therapy with close follow-up.  The likelihood of successful and durable mitral valve repair has been discussed with particular reference to the findings of the most recent echocardiogram.  Based upon these findings and previous experience, I have quoted a greater than 95 percent likelihood of  successful valve repair with less than 1 percent risk of mortality or major morbidity.  Alternative surgical approaches have been discussed including a comparison between conventional sternotomy and minimally-invasive techniques.  The relative risks and benefits of each have been reviewed as they pertain to the patient's  specific circumstances, and expectations for the patient's postoperative convalescence has been discussed.    We tentatively plan to proceed with surgery on Sep 22, 2020.  The patient is scheduled for follow-up visit with his dentist tomorrow to have 2 crowns implanted, and he will make certain that his dentist does not feel that there is any sign of infection related to the pain he has been having on his upper teeth on the left side.  He will also discussed with Dr. Ezzard Standing whether or not he has completely recovered from his recent sinus surgery and can proceed with mitral valve repair at this time.  The patient will return to our office prior to surgery on Sep 21, 2023 and he will contact us soon as possible if there is any reason to delay.  All of his questions have been answered.    I spent in excess of 30 minutes during the conduct of this office consultation and >50% of this time involved direct face-to-face encounter with the patient for counseling and/or coordination of their care.    Salvatore Decent. Cornelius Moras, MD 09/06/2020 3:16 PM

## 2020-09-06 NOTE — Patient Instructions (Signed)
   Continue taking all current medications without change through the day before surgery.  Make sure to bring all of your medications with you when you come for your Pre-Admission Testing appointment at [facility] Short-Stay Department.  Have nothing to eat or drink after midnight the night before surgery.  On the morning of surgery take only Synthroid with a sip of water.  At your appointment for Pre-Admission Testing at the [facility] Short-Stay Department you will be asked to sign permission forms for your upcoming surgery.  By definition your signature on these forms implies that you and/or your designee provide full informed consent for your planned surgical procedure(s), that alternative treatment options have been discussed, that you understand and accept any and all potential risks, and that you have some understanding of what to expect for your post-operative convalescence.  For elective mitral valve repair or replacement potential operative risks include but are not limited to at least some risk of death, stroke or other neurologic complication, myocardial infarction, congestive heart failure, respiratory failure, renal failure, bleeding requiring transfusion and/or reexploration, arrhythmia, heart block or bradycardia requiring permanent pacemaker insertion, infection or other wound complications, pneumonia, pleural and/or pericardial effusion, pulmonary embolus, aortic dissection or other major vascular complication, or other immediate or delayed complications related to valve repair or replacement including but not limited to recurrent or persistent mitral regurgitation and/or mitral stenosis, LV outflow tract obstruction, aortic insufficiency, paravalvular leak, posterior AV groove disruption, late structural valve deterioration and failure, thrombosis, embolization, or endocarditis.  Specific risks potentially related to the minimally-invasive approach  include but are not limited to risk of conversion to full or partial sternotomy, aortic dissection or other major vascular complication, unilateral acute lung injury or pulmonary edema, phrenic nerve dysfunction or paralysis, rib fracture, chronic pain, lung hernia, or lymphocele.  Please call to schedule a follow-up appointment in our office prior to surgery if you have any unresolved questions about your planned surgical procedure, the associated risks, alternative treatment options, and/or expectations for your post-operative recovery.

## 2020-09-08 ENCOUNTER — Telehealth: Payer: Self-pay | Admitting: *Deleted

## 2020-09-08 NOTE — Telephone Encounter (Signed)
Reached out to Dr. Allene Pyo office and spoke with Elease Hashimoto for Mr. Ding. Per Dr. Orvan July last office note, he requested Mr. Michiels f/u with Dr. Ezzard Standing to discuss whether or not he has completely recovered from recent sinus surgery prior to him undergoing mitral valve repair. Per Elease Hashimoto, she will get back to [place] with that information.

## 2020-09-10 ENCOUNTER — Other Ambulatory Visit: Payer: Self-pay

## 2020-09-10 ENCOUNTER — Ambulatory Visit (INDEPENDENT_AMBULATORY_CARE_PROVIDER_SITE_OTHER): Payer: 59 | Admitting: Otolaryngology

## 2020-09-10 VITALS — Temp 97.7°F

## 2020-09-10 DIAGNOSIS — Z4889 Encounter for other specified surgical aftercare: Secondary | ICD-10-CM

## 2020-09-10 NOTE — Progress Notes (Addendum)
HPI: Chad Berry is a 80 y.o. male who presents 4 weeks s/p left sphenoidotomy for chronic fungal sphenoid sinusitis.  He is doing well.  Presents for clearance for his scheduled cardiac surgery for mitral valve replacement surgery..   Past Medical History:  Diagnosis Date  . Aortic atherosclerosis (HCC)   . Bradycardia    while on BB with HR to the 40s  . Carotid stenosis    CAROTID DOPPLER, 12/07/2011 - Mild arthrosclerotic changes, no high-grade stenosis  . Cataract    rt. eye  . Clostridium difficile infection 05/2019  . Diverticulitis   . Heart murmur   . History of echocardiogram    Echo 9/16:  EF 60-65%, no RWMA, Gr 1 DD, holosystolic MVP of posterior leaflet, mild MR, LA upper limits of normal, normal RVF, mod TR, PASP 50 mmHg  . Hypercholesteremia   . Hyperlipidemia   . Hypertension   . Hypothyroidism   . MVP (mitral valve prolapse)    a. Echo 12/07/2011 - EF-65-70%, severe mitral regurgitation, moderate-severe tricuspid regurgitation, moderate pulmonary HTN, moderate pulmonic regurgitation;  b. 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 Prolapsed internal hemorrhoids, grade 3 02/14/2017  . PVC (premature ventricular contraction)   . Rapid palpitations    event monitor with short bursts of SVT  . Renal cyst   . Testicular hypofunction   . Vitamin D deficiency    Past Surgical History:  Procedure Laterality Date  . COLONOSCOPY    . ETHMOIDECTOMY Left 08/13/2020   Procedure: SPENOIDECTOMY AND ETHMOIDECTOMY;  Surgeon: Drema Halon, MD;  Location: [facility] OR;  Service: ENT;  Laterality: Left;  . RIGHT/LEFT HEART CATH AND CORONARY ANGIOGRAPHY N/A 08/30/2020   Procedure: RIGHT/LEFT HEART CATH AND CORONARY ANGIOGRAPHY;  Surgeon: Runell Gess, MD;  Location: [facility] INVASIVE CV LAB;  Service:  Cardiovascular;  Laterality: N/A;  . SIGMOIDOSCOPY  02/14/2017  . SINUS ENDO WITH FUSION Left 08/13/2020   Procedure: SINUS ENDO WITH FUSION;  Surgeon: Drema Halon, MD;  Location: [facility] OR;  Service: ENT;  Laterality: Left;  . TEE WITHOUT CARDIOVERSION N/A 05/04/2020   Procedure: TRANSESOPHAGEAL ECHOCARDIOGRAM (TEE);  Surgeon: Little Ishikawa, MD;  Location: [facility] ENDOSCOPY;  Service: Cardiovascular;  Laterality: N/A;   Social History   Socioeconomic History  . Marital status: Married    Spouse name: Not on file  . Number of children: Not on file  . Years of education: Not on file  . Highest education level: Not on file  Occupational History  . Not on file  Tobacco Use  . Smoking status: Former Smoker    Quit date: 12/23/1972    Years since quitting: 47.7  . Smokeless tobacco: Former Neurosurgeon    Types: Engineer, drilling  . Vaping Use: Never used  Substance and Sexual Activity  . Alcohol use: Yes    Alcohol/week: 0.0 standard drinks    Comment: occasionally  . Drug use: No  . Sexual activity: Not on file  Other Topics Concern  . 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 Stroke Mother 27  . Heart failure Father   . Diverticulitis Sister   . Stroke Brother 3  . Heart attack Brother   . Colon cancer Neg Hx   . Esophageal cancer Neg Hx   . Stomach cancer Neg Hx   . Rectal cancer Neg Hx    Allergies  Allergen Reactions  . Contrast Media [Iodinated Diagnostic Agents] Hives, Nausea And Vomiting and Cough    Pt will need a 13 hour prep before any contrast per Dr. Paulina Fusi (05/14/19)  . Hctz [Hydrochlorothiazide] Other (See Comments)    palpitations  . Metoprolol Succinate [Metoprolol] Other (See Comments)    Bradycardia     Prior to Admission medications   Medication Sig Start Date End Date Taking? Authorizing Provider  acetaminophen-codeine (TYLENOL #3) 300-30 MG tablet Take 1 tablet by mouth every 4 (four) hours as needed for pain. 08/02/20   [provider]  Ascorbic Acid (VITAMIN C) 1000 MG tablet Take 1,000 mg by mouth daily.    [provider]  cephALEXin (KEFLEX) 500 MG capsule Take 1 capsule (500 mg total) by mouth 2 (two) times daily. 08/13/20   Drema Halon, MD  Cholecalciferol (VITAMIN D) 50 MCG (2000 UT) tablet Take 2,000 Units by mouth daily.    [provider]  ezetimibe (ZETIA) 10 MG tablet Take 10 mg by mouth at bedtime.    [provider]  ibuprofen (ADVIL) 200 MG tablet Take 400 mg by mouth every 6 (six) hours as needed for headache (Sinus).    [provider]  levothyroxine (SYNTHROID) 75 MCG tablet Take 75 mcg by mouth at bedtime.    [provider]  losartan (COZAAR) 100 MG tablet Take 1 tablet (100 mg total) by mouth daily. 06/09/15   Azalee Course, PA  rosuvastatin (CRESTOR) 40 MG tablet Take 40 mg by mouth at bedtime.    [provider]     Physical Exam: On nasal endoscopy he has some crusting over the sphenoid area but there is no clinical evidence of active infection.   Assessment: S/p left sphenoidotomy with removal of fungal disease from left sphenoid sinus.  Plan: He is doing well status post surgery.  He will continue with frequent saline rinses until the crusting has resolved.  He will follow-up in 1 week for recheck. He should be fine to have cardiac surgery. No clinical evidence of active infection although he still has crusting which will continue for several more weeks.   Narda Bonds, MD

## 2020-09-14 ENCOUNTER — Other Ambulatory Visit: Payer: Self-pay

## 2020-09-14 ENCOUNTER — Encounter: Payer: Self-pay | Admitting: Cardiovascular Disease

## 2020-09-14 ENCOUNTER — Ambulatory Visit (INDEPENDENT_AMBULATORY_CARE_PROVIDER_SITE_OTHER): Payer: 59 | Admitting: Cardiovascular Disease

## 2020-09-14 VITALS — BP 146/78 | HR 72 | Ht 71.0 in | Wt 228.0 lb

## 2020-09-14 DIAGNOSIS — I340 Nonrheumatic mitral (valve) insufficiency: Secondary | ICD-10-CM

## 2020-09-14 NOTE — Progress Notes (Signed)
Mr. Garriga returns today for follow-up of his right and left heart cath performed in the evaluation of severe MR and MR symptomatic.  He underwent transesophageal echocardiography by Dr. Bjorn Pippin revealing a flail P2.  He had essentially normal coronary arteries on heart catheterization with fairly normal filling pressures.  He saw Dr. Cornelius Moras in the office on 5/9 who has set him up for minimally invasive mitral valve repair on 5/25.  He has had dental evaluation in the interim.  He is looking forward to having his surgical procedure.  I will see him back 4 to 6 weeks postop.  Runell Gess, M.D., FACP, [facility], [facility] [address] [place], [place]  [address]  [phone] 09/14/2020 3:27 PM

## 2020-09-14 NOTE — Patient Instructions (Signed)
Medication Instructions:  Your physician recommends that you continue on your current medications as directed. Please refer to the Current Medication list given to you today.  *If you need a refill on your cardiac medications before your next appointment, please call your pharmacy*   Follow-Up: At [facility], you and your health needs are our priority.  As part of our continuing mission to provide you with exceptional heart care, we have created designated Provider Care Teams.  These Care Teams include your primary Cardiologist (physician) and Advanced Practice Providers (APPs -  Physician Assistants and Nurse Practitioners) who all work together to provide you with the care you need, when you need it.  We recommend signing up for the patient portal called "MyChart".  Sign up information is provided on this After Visit Summary.  MyChart is used to connect with patients for Virtual Visits (Telemedicine).  Patients are able to view lab/test results, encounter notes, upcoming appointments, etc.  Non-urgent messages can be sent to your provider as well.   To learn more about what you can do with MyChart, go to [email or web address].    Your next appointment:   4-6 week(s)  The format for your next appointment:   In Person  Provider:   Nanetta Batty, MD

## 2020-09-17 ENCOUNTER — Ambulatory Visit (INDEPENDENT_AMBULATORY_CARE_PROVIDER_SITE_OTHER): Payer: 59 | Admitting: Otolaryngology

## 2020-09-17 ENCOUNTER — Other Ambulatory Visit: Payer: Self-pay

## 2020-09-17 VITALS — Temp 97.3°F

## 2020-09-17 DIAGNOSIS — Z4889 Encounter for other specified surgical aftercare: Secondary | ICD-10-CM

## 2020-09-17 NOTE — Pre-Procedure Instructions (Signed)
Surgical Instructions:    Your procedure is scheduled on Wednesday, May 25th (07:30 AM- 1:31 PM).  Report to [facility] Main Entrance "A" at 05:30 A.M., then check in with the Admitting office.  Call this number if you have any questions prior to, or have any problems the morning of surgery:  [phone]    Remember:  Do not eat or drink after midnight the night before your surgery.     Take these medicines the morning of surgery:  sodium chloride (OCEAN) nasal spray- if needed    As of today, STOP taking any Aspirin (unless otherwise instructed by your surgeon) Aleve, Naproxen, Ibuprofen, Motrin, Advil, Goody's, BC's, all herbal medications, fish oil, and all vitamins.              Special instructions:   [facility]- Preparing For Surgery  Before surgery, you can play an important role. Because skin is not sterile, your skin needs to be as free of germs as possible. You can reduce the number of germs on your skin by washing with CHG (chlorahexidine gluconate) Soap before surgery.  CHG is an antiseptic cleaner which kills germs and bonds with the skin to continue killing germs even after washing.    Oral Hygiene is also important to reduce your risk of infection.  Remember - BRUSH YOUR TEETH THE MORNING OF SURGERY WITH YOUR REGULAR TOOTHPASTE  Please do not use if you have an allergy to CHG or antibacterial soaps. If your skin becomes reddened/irritated stop using the CHG.  Do not shave (including legs and underarms) for at least 48 hours prior to first CHG shower. It is OK to shave your face.  Please follow these instructions carefully.   1. Shower the NIGHT BEFORE SURGERY and the MORNING OF SURGERY  2. If you chose to wash your hair, wash your hair first as usual with your normal shampoo.  3. After you shampoo, rinse your hair and body thoroughly to remove the shampoo.  4. Wash Face and genitals (private parts) with your normal soap.   5. Use CHG Soap as you would any  other liquid soap. You can apply CHG directly to the skin and wash gently with a scrungie or a clean washcloth.   6. Apply the CHG Soap to your body ONLY FROM THE NECK DOWN.  Do not use on open wounds or open sores. Avoid contact with your eyes, ears, mouth and genitals (private parts). Wash Face and genitals (private parts)  with your normal soap.   7. Wash thoroughly, paying special attention to the area where your surgery will be performed.  8. Thoroughly rinse your body with warm water from the neck down.  9. DO NOT shower/wash with your normal soap after using and rinsing off the CHG Soap.  10. Pat yourself dry with a CLEAN TOWEL.  11. Wear CLEAN PAJAMAS to bed the night before surgery.  12. Place CLEAN SHEETS on your bed the night before your surgery.  13. DO NOT SLEEP WITH PETS.   Day of Surgery: SHOWER with CHG soap. Brush your teeth WITH YOUR REGULAR TOOTHPASTE. Wear Clean/Comfortable clothing the morning of surgery. Do not apply any deodorants/lotions.   Do not wear jewelry. Do not shave 48 hours prior to surgery.  Men may shave face and neck. Do not bring valuables to the hospital. [facility] is not responsible for any belongings or valuables.   Do NOT Smoke (Tobacco/Vaping) or drink Alcohol 24 hours prior to your procedure.  If you use a CPAP at night, you may bring all equipment for your overnight stay.   Contacts, glasses, or dentures may not be worn into surgery, please bring cases for these belongings.   For patients admitted to the hospital, discharge time will be determined by your treatment team.   Patients discharged the day of surgery will not be allowed to drive home, and someone needs to stay with them for 24 hours.    Please read over the following fact sheets that you were given.

## 2020-09-17 NOTE — Progress Notes (Signed)
HPI: Chad Berry is a 80 y.o. male who presents 5 weeks s/p left sphenoidotomy for chronic fungal sphenoid sinus disease.  He presents today for recheck he is scheduled to have mitral valve surgery next week..  Past Medical History:  Diagnosis Date  . Aortic atherosclerosis (HCC)   . Bradycardia    while on BB with HR to the 40s  . Carotid stenosis    CAROTID DOPPLER, 12/07/2011 - Mild arthrosclerotic changes, no high-grade stenosis  . Cataract    rt. eye  . Clostridium difficile infection 05/2019  . Diverticulitis   . Heart murmur   . History of echocardiogram    Echo 9/16:  EF 60-65%, no RWMA, Gr 1 DD, holosystolic MVP of posterior leaflet, mild MR, LA upper limits of normal, normal RVF, mod TR, PASP 50 mmHg  . Hypercholesteremia   . Hyperlipidemia   . Hypertension   . Hypothyroidism   . MVP (mitral valve prolapse)    a. Echo 12/07/2011 - EF-65-70%, severe mitral regurgitation, moderate-severe tricuspid regurgitation, moderate pulmonary HTN, moderate pulmonic regurgitation;  b. 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 Prolapsed internal hemorrhoids, grade 3 02/14/2017  . PVC (premature ventricular contraction)   . Rapid palpitations    event monitor with short bursts of SVT  . Renal cyst   . Testicular hypofunction   . Vitamin D deficiency    Past Surgical History:  Procedure Laterality Date  . COLONOSCOPY    . ETHMOIDECTOMY Left 08/13/2020   Procedure: SPENOIDECTOMY AND ETHMOIDECTOMY;  Surgeon: Drema Halon, MD;  Location: [facility] OR;  Service: ENT;  Laterality: Left;  . RIGHT/LEFT HEART CATH AND CORONARY ANGIOGRAPHY N/A 08/30/2020   Procedure: RIGHT/LEFT HEART CATH AND CORONARY ANGIOGRAPHY;  Surgeon: Runell Gess, MD;  Location: [facility] INVASIVE CV LAB;  Service: Cardiovascular;  Laterality: N/A;   . SIGMOIDOSCOPY  02/14/2017  . SINUS ENDO WITH FUSION Left 08/13/2020   Procedure: SINUS ENDO WITH FUSION;  Surgeon: Drema Halon, MD;  Location: [facility] OR;  Service: ENT;  Laterality: Left;  . TEE WITHOUT CARDIOVERSION N/A 05/04/2020   Procedure: TRANSESOPHAGEAL ECHOCARDIOGRAM (TEE);  Surgeon: Little Ishikawa, MD;  Location: [facility] ENDOSCOPY;  Service: Cardiovascular;  Laterality: N/A;   Social History   Socioeconomic History  . Marital status: Married    Spouse name: Not on file  . Number of children: Not on file  . Years of education: Not on file  . Highest education level: Not on file  Occupational History  . Not on file  Tobacco Use  . Smoking status: Former Smoker    Quit date: 12/23/1972    Years since quitting: 47.7  . Smokeless tobacco: Former Neurosurgeon    Types: Engineer, drilling  . Vaping Use: Never used  Substance and Sexual Activity  . Alcohol use: Yes    Alcohol/week: 0.0 standard drinks    Comment: occasionally  . Drug use: No  . Sexual activity: Not on file  Other Topics Concern  . 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 Stroke Mother 66  . Heart failure Father   . Diverticulitis Sister   . Stroke Brother 1  . Heart attack Brother   . Colon cancer Neg Hx   . Esophageal cancer Neg Hx   . Stomach cancer Neg Hx   . Rectal cancer Neg Hx    Allergies  Allergen Reactions  . Contrast Media [Iodinated Diagnostic Agents] Hives, Nausea And Vomiting and Cough    Pt will need a 13 hour prep before any contrast per Dr. Paulina Fusi (05/14/19)  . Hctz  [Hydrochlorothiazide] Other (See Comments)    palpitations  . Metoprolol Succinate [Metoprolol] Other (See Comments)    Bradycardia     Prior to Admission medications   Medication Sig Start Date End Date Taking? Authorizing Provider  Ascorbic Acid (VITAMIN C) 1000 MG tablet Take 1,000 mg by mouth daily.    [provider]  Cholecalciferol (VITAMIN D) 50 MCG (2000 UT) tablet Take 2,000 Units by mouth daily.    [provider]  ezetimibe (ZETIA) 10 MG tablet Take 10 mg by mouth at bedtime.    [provider]  fluticasone (FLONASE) 50 MCG/ACT nasal spray Place 1 spray into both nostrils at bedtime.    [provider]  ibuprofen (ADVIL) 200 MG tablet Take 400 mg by mouth every 6 (six) hours as needed for headache (Sinus).    [provider]  levothyroxine (SYNTHROID) 75 MCG tablet Take 75 mcg by mouth at bedtime.    [provider]  losartan (COZAAR) 100 MG tablet Take 1 tablet (100 mg total) by mouth daily. 06/09/15   Azalee Course, PA  rosuvastatin (CRESTOR) 40 MG tablet Take 40 mg by mouth at bedtime.    [provider]  sodium chloride (OCEAN) 0.65 % SOLN nasal spray Place 1 spray into both nostrils as needed for congestion.    [provider]     Physical Exam: On nasal endoscopy the sphenoid ostia is patent and the sphenoid sinuses are clear.  Still has some crusting in the posterior nasal cavity but this is minimal with no signs of infection.   Assessment: S/p left sphenoidotomy 5 weeks ago doing well.  Plan: He should be cleared for surgery as there is no signs of persistent infection. Still has some crusting in place and will follow-up in 3 to 4 weeks for final recheck. He is doing well otherwise and using saline rinses.Narda Bonds, MD

## 2020-09-20 ENCOUNTER — Ambulatory Visit (HOSPITAL_COMMUNITY)
Admission: RE | Admit: 2020-09-20 | Discharge: 2020-09-20 | Disposition: A | Discharge status: Home / Self Care | Payer: 59 | Source: Ambulatory Visit | Attending: Thoracic Surgery (Cardiothoracic Vascular Surgery) | Admitting: Thoracic Surgery (Cardiothoracic Vascular Surgery)

## 2020-09-20 ENCOUNTER — Ambulatory Visit (INDEPENDENT_AMBULATORY_CARE_PROVIDER_SITE_OTHER): Payer: 59 | Admitting: Thoracic Surgery (Cardiothoracic Vascular Surgery)

## 2020-09-20 ENCOUNTER — Encounter: Payer: Self-pay | Admitting: Thoracic Surgery (Cardiothoracic Vascular Surgery)

## 2020-09-20 ENCOUNTER — Encounter (HOSPITAL_COMMUNITY): Payer: Self-pay

## 2020-09-20 ENCOUNTER — Ambulatory Visit (HOSPITAL_BASED_OUTPATIENT_CLINIC_OR_DEPARTMENT_OTHER)
Admission: RE | Admit: 2020-09-20 | Discharge: 2020-09-20 | Disposition: A | Discharge status: Home / Self Care | Payer: 59 | Source: Ambulatory Visit | Attending: Thoracic Surgery (Cardiothoracic Vascular Surgery) | Admitting: Thoracic Surgery (Cardiothoracic Vascular Surgery)

## 2020-09-20 ENCOUNTER — Encounter (HOSPITAL_COMMUNITY)
Admission: RE | Admit: 2020-09-20 | Discharge: 2020-09-20 | Disposition: A | Discharge status: Home / Self Care | Payer: 59 | Source: Ambulatory Visit | Attending: Thoracic Surgery (Cardiothoracic Vascular Surgery) | Admitting: Thoracic Surgery (Cardiothoracic Vascular Surgery)

## 2020-09-20 ENCOUNTER — Other Ambulatory Visit (HOSPITAL_COMMUNITY): Payer: 59

## 2020-09-20 ENCOUNTER — Other Ambulatory Visit: Payer: Self-pay

## 2020-09-20 VITALS — BP 172/76 | HR 60 | Resp 20 | Wt 224.0 lb

## 2020-09-20 DIAGNOSIS — E785 Hyperlipidemia, unspecified: Secondary | ICD-10-CM | POA: Insufficient documentation

## 2020-09-20 DIAGNOSIS — Z01818 Encounter for other preprocedural examination: Secondary | ICD-10-CM | POA: Insufficient documentation

## 2020-09-20 DIAGNOSIS — I341 Nonrheumatic mitral (valve) prolapse: Secondary | ICD-10-CM

## 2020-09-20 DIAGNOSIS — Z20822 Contact with and (suspected) exposure to covid-19: Secondary | ICD-10-CM | POA: Insufficient documentation

## 2020-09-20 DIAGNOSIS — I10 Essential (primary) hypertension: Secondary | ICD-10-CM | POA: Insufficient documentation

## 2020-09-20 DIAGNOSIS — I340 Nonrheumatic mitral (valve) insufficiency: Secondary | ICD-10-CM | POA: Insufficient documentation

## 2020-09-20 LAB — CBC
HCT: 41.1 % (ref 39.0–52.0)
Hemoglobin: 14.3 g/dL (ref 13.0–17.0)
MCH: 32.8 pg (ref 26.0–34.0)
MCHC: 34.8 g/dL (ref 30.0–36.0)
MCV: 94.3 fL (ref 80.0–100.0)
Platelets: 144 10*3/uL — ABNORMAL LOW (ref 150–400)
RBC: 4.36 MIL/uL (ref 4.22–5.81)
RDW: 12.2 % (ref 11.5–15.5)
WBC: 5.3 10*3/uL (ref 4.0–10.5)
nRBC: 0 % (ref 0.0–0.2)

## 2020-09-20 LAB — URINALYSIS, ROUTINE W REFLEX MICROSCOPIC
Bacteria, UA: NONE SEEN
Bilirubin Urine: NEGATIVE
Glucose, UA: NEGATIVE mg/dL
Ketones, ur: NEGATIVE mg/dL
Leukocytes,Ua: NEGATIVE
Nitrite: NEGATIVE
Protein, ur: NEGATIVE mg/dL
Specific Gravity, Urine: 1.021 (ref 1.005–1.030)
pH: 5 (ref 5.0–8.0)

## 2020-09-20 LAB — COMPREHENSIVE METABOLIC PANEL
ALT: 23 U/L (ref 0–44)
AST: 22 U/L (ref 15–41)
Albumin: 3.9 g/dL (ref 3.5–5.0)
Alkaline Phosphatase: 53 U/L (ref 38–126)
Anion gap: 8 (ref 5–15)
BUN: 12 mg/dL (ref 8–23)
CO2: 22 mmol/L (ref 22–32)
Calcium: 8.9 mg/dL (ref 8.9–10.3)
Chloride: 110 mmol/L (ref 98–111)
Creatinine, Ser: 0.98 mg/dL (ref 0.61–1.24)
GFR, Estimated: >60 mL/min (ref >60)
Glucose, Bld: 100 mg/dL — ABNORMAL HIGH (ref 70–99)
Potassium: 4.1 mmol/L (ref 3.5–5.1)
Sodium: 140 mmol/L (ref 135–145)
Total Bilirubin: 1.1 mg/dL (ref 0.3–1.2)
Total Protein: 6.7 g/dL (ref 6.5–8.1)

## 2020-09-20 LAB — APTT: aPTT: 28 seconds (ref 24–36)

## 2020-09-20 LAB — HEMOGLOBIN A1C
Hgb A1c MFr Bld: 6 % — ABNORMAL HIGH (ref 4.8–5.6)
Mean Plasma Glucose: 125.5 mg/dL

## 2020-09-20 LAB — SARS CORONAVIRUS 2 (TAT 6-24 HRS): SARS Coronavirus 2: NEGATIVE

## 2020-09-20 LAB — PROTIME-INR
INR: 1.2 (ref 0.8–1.2)
Prothrombin Time: 14.7 seconds (ref 11.4–15.2)

## 2020-09-20 LAB — BLOOD GAS, ARTERIAL
Acid-base deficit: 0.8 mmol/L (ref 0.0–2.0)
Bicarbonate: 23.6 mmol/L (ref 20.0–28.0)
FIO2: 21
O2 Saturation: 97.9 %
Patient temperature: 37
pCO2 arterial: 40.4 mmHg (ref 32.0–48.0)
pH, Arterial: 7.384 (ref 7.350–7.450)
pO2, Arterial: 104 mmHg (ref 83.0–108.0)

## 2020-09-20 LAB — SURGICAL PCR SCREEN
MRSA, PCR: NEGATIVE
Staphylococcus aureus: NEGATIVE

## 2020-09-20 NOTE — H&P (Signed)
[address]       [name] [address]             [phone]          CARDIOTHORACIC SURGERY HISTORY AND PHYSICAL EXAM  Referring Provider is Runell Gess, MD PCP is Mosetta Putt, MD  Chief Complaint  Patient presents with  . Mitral Regurgitation    Initial surgical consult, TEE 1/4, ECHO 11/10    HPI:  Patient is 80 year old moderately overweight male with history of mitral valve prolapse, mitral regurgitation, hypertension, hyperlipidemia, hypothyroidism, and asymptomatic cerebrovascular disease who has been referred for surgical consultation to discuss treatment options for management of mitral valve prolapse with severe mitral regurgitation.  Patient has known of presence of heart murmur and mitral valve prolapse for more than 20 years.  He has been followed up for many years by Dr. Allyson Sabal.  Follow-up echocardiogram performed December 2020 revealed normal left ventricular systolic function with mitral valve prolapse and what was felt to be moderate mitral regurgitation.  The patient is always remained active physically and without significant physical limitations.  Recent echocardiogram performed March 10, 2020 revealed normal left ventricular size and systolic function with mitral valve prolapse and severe mitral regurgitation.  There was mild left atrial enlargement.  The patient subsequently underwent transesophageal echocardiogram May 04, 2020.  TEE revealed mitral valve prolapse with an obvious flail segment involving the middle scallop (P2) of the posterior leaflet causing severe mitral regurgitation.  Left ventricular size and systolic function remain normal.  There was mild left atrial enlargement.  No other significant abnormalities were noted.  Cardiothoracic surgical consultation was requested.  Patient is married and [place] near [place].  He has worked in a [employer] located in [place] for  [employer] for many years.  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  He has remained physically active and entirely functionally independent in all of his life.  He continues to race automobiles, including his recently purchased Corvette.  Patient states that he has had occasional brief episodes of fleeting chest pain of varying intensity and severity off and on for over 20 years.  These have been associated with occasional palpitations which seem to be increasing in frequency recently.  He denies any exertional shortness of breath, resting shortness of breath, PND, orthopnea, or lower extremity edema.  He has not had any dizzy spells, nor syncope.  He has been fully vaccinated against the COVID-19 vaccine using 2 shots from the [facility] vaccine.  He has not received a booster.  To his knowledge he has never had COVID 19 infection.  He does complain that for the last several months he has had problems with intermittent headaches and sinus congestion.  He was recently diagnosed with sinusitis and has been recommended by his primary care physician that he see an otolaryngologist.  Patient returns to the office today to further discuss treatment options for management of mitral valve prolapse with severe mitral regurgitation.  He was initially seen in consultation on May 10, 2020.  At that time his primary complaint was that of headaches and other symptoms related to chronic sinusitis.  He was not experiencing significant exertional shortness of breath although he did complain of fatigue.  Since then he underwent sinus surgery by Dr. Ezzard Standing on August 13, 2020.  Symptoms of headaches and sinusitis have resolved and he  has been seen in the office by Dr. Ezzard Standing on 2 occasions, most recently Sep 02, 2020 at which time he was doing well.  He is still having some pain in his upper teeth on the same side as his surgery.  He has also been seen in follow-up by  Dr. Gery Pray and he underwent routine left and right heart catheterization on Aug 30, 2020.  Catheterization revealed mild nonobstructive coronary artery disease with normal right heart pressures.  Patient returns to our office today to further discuss whether or not to proceed with elective mitral valve repair in the near future.  Patient states that he feels as though his mitral valve is getting worse.  He has started to notice that he gets short of breath walking up a flight of stairs.  He does not get short of breath with ordinary activity and he can walk 2 miles on flat ground without any shortness of breath.  He does complain of chronic fatigue.  He has not had any chest pain or chest tightness.  He has not had any resting shortness of breath, PND, orthopnea, or lower extremity edema.  He has not had dizziness, or syncope.  He still has occasional palpitations and brief episodes of atypical chest discomfort.  He has not had any fevers or chills.  He denies productive cough.  Patientreturns to the office today for follow-up prior to planned elective mitral valve repair for treatment of mitral valve prolapse with severe symptomatic primary mitral regurgitation.   He was initially seen in consultation on May 10, 2020 and he was last seen here in our office on Sep 06, 2020.  Since then he has been seen again in follow-up by Dr. Ezzard Standing who has cleared him to proceed with elective surgery.  The patient has also been seen in follow-up by Dr. Allyson Sabal and he reports no new problems or complaints.  He is looking forward to proceeding with surgery later this week.  Past Medical History:  Diagnosis Date  . Aortic atherosclerosis (HCC)   . Bradycardia    while on BB with HR to the 40s  . Carotid stenosis    CAROTID DOPPLER, 12/07/2011 - Mild arthrosclerotic changes, no high-grade stenosis  . Cataract    rt. eye  . Clostridium difficile infection 05/2019  . Diverticulitis   . Heart murmur   . History of  echocardiogram    Echo 9/16:  EF 60-65%, no RWMA, Gr 1 DD, holosystolic MVP of posterior leaflet, mild MR, LA upper limits of normal, normal RVF, mod TR, PASP 50 mmHg  . Hypercholesteremia   . Hyperlipidemia   . Hypertension   . Hypothyroidism   . MVP (mitral valve prolapse)    a. Echo 12/07/2011 - EF-65-70%, severe mitral regurgitation, moderate-severe tricuspid regurgitation, moderate pulmonary HTN, moderate pulmonic regurgitation;  b. 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 Prolapsed internal hemorrhoids, grade 3 02/14/2017  . PVC (premature ventricular contraction)   . Rapid palpitations    event monitor with short bursts of SVT  . Renal cyst   . Testicular hypofunction   . Vitamin D deficiency     Past Surgical History:  Procedure Laterality Date  . COLONOSCOPY    . ETHMOIDECTOMY Left 08/13/2020   Procedure: SPENOIDECTOMY AND ETHMOIDECTOMY;  Surgeon: Drema Halon, MD;  Location: [facility]  OR;  Service: ENT;  Laterality: Left;  . RIGHT/LEFT HEART CATH AND CORONARY ANGIOGRAPHY N/A 08/30/2020   Procedure: RIGHT/LEFT HEART CATH AND CORONARY ANGIOGRAPHY;  Surgeon: Runell Gess, MD;  Location: [facility] INVASIVE CV LAB;  Service: Cardiovascular;  Laterality: N/A;  . SIGMOIDOSCOPY  02/14/2017  . SINUS ENDO WITH FUSION Left 08/13/2020   Procedure: SINUS ENDO WITH FUSION;  Surgeon: Drema Halon, MD;  Location: [facility] OR;  Service: ENT;  Laterality: Left;  . TEE WITHOUT CARDIOVERSION N/A 05/04/2020   Procedure: TRANSESOPHAGEAL ECHOCARDIOGRAM (TEE);  Surgeon: Little Ishikawa, MD;  Location: [facility] ENDOSCOPY;  Service: Cardiovascular;  Laterality: N/A;    Family History  Problem Relation Age of Onset  . Stroke Mother 63  . Heart failure Father   . Diverticulitis Sister   . Stroke Brother 73  . Heart  attack Brother   . Colon cancer Neg Hx   . Esophageal cancer Neg Hx   . Stomach cancer Neg Hx   . Rectal cancer Neg Hx     Social History Social History   Tobacco Use  . Smoking status: Former Smoker    Quit date: 12/23/1972    Years since quitting: 47.7  . Smokeless tobacco: Former Neurosurgeon    Types: Engineer, drilling  . Vaping Use: Never used  Substance Use Topics  . Alcohol use: Yes    Alcohol/week: 0.0 standard drinks    Comment: occasionally  . Drug use: No    Prior to Admission medications   Medication Sig Start Date End Date Taking? Authorizing Provider  Ascorbic Acid (VITAMIN C) 1000 MG tablet Take 1,000 mg by mouth daily.   Yes [provider]  Cholecalciferol (VITAMIN D) 50 MCG (2000 UT) tablet Take 2,000 Units by mouth daily.   Yes [provider]  ezetimibe (ZETIA) 10 MG tablet Take 10 mg by mouth at bedtime.   Yes [provider]  fluticasone (FLONASE) 50 MCG/ACT nasal spray Place 1 spray into both nostrils at bedtime.   Yes [provider]  ibuprofen (ADVIL) 200 MG tablet Take 400 mg by mouth every 6 (six) hours as needed for headache (Sinus).   Yes [provider]  levothyroxine (SYNTHROID) 75 MCG tablet Take 75 mcg by mouth at bedtime.   Yes [provider]  losartan (COZAAR) 100 MG tablet Take 1 tablet (100 mg total) by mouth daily. 06/09/15  Yes Azalee Course, PA  rosuvastatin (CRESTOR) 40 MG tablet Take 40 mg by mouth at bedtime.   Yes [provider]  sodium chloride (OCEAN) 0.65 % SOLN nasal spray Place 1 spray into both nostrils as needed for congestion.   Yes [provider]    Allergies  Allergen Reactions  . Contrast Media [Iodinated Diagnostic Agents] Hives, Nausea And Vomiting and Cough    Pt will need a 13 hour prep before any contrast per Dr. Paulina Fusi (05/14/19)  . Hctz [Hydrochlorothiazide] Other (See Comments)    palpitations  . Metoprolol Succinate [Metoprolol] Other (See  Comments)    Bradycardia       Review of Systems:              General:                      normal appetite, normal energy, no weight gain, no weight loss, no fever             Cardiac:  no chest pain with exertion, no chest pain at rest, no SOB with exertion, no resting SOB, no PND, no orthopnea, occasional palpitations, no arrhythmia, no atrial fibrillation, no LE edema, no dizzy spells, no syncope             Respiratory:                 no shortness of breath, no home oxygen, no productive cough, no dry cough, no bronchitis, no wheezing, no hemoptysis, no asthma, no pain with inspiration or cough, no sleep apnea, no CPAP at night             GI:                               no difficulty swallowing, no reflux, no frequent heartburn, no hiatal hernia, no abdominal pain, no constipation, no diarrhea, no hematochezia, no hematemesis, no melena             GU:                              no dysuria,  no frequency, no urinary tract infection, no hematuria, no enlarged prostate, no kidney stones, no kidney disease             Vascular:                     no pain suggestive of claudication, no pain in feet, no leg cramps, no varicose veins, no DVT, no non-healing foot ulcer             Neuro:                         no stroke, no TIA's, no seizures, no headaches, no temporary blindness one eye,  no slurred speech, no peripheral neuropathy, no chronic pain, no instability of gait, no memory/cognitive dysfunction             Musculoskeletal:         no arthritis, no joint swelling, no myalgias, no difficulty walking, normal mobility              Skin:                            no rash, no itching, no skin infections, no pressure sores or ulcerations             Psych:                         no anxiety, no depression, no nervousness, no unusual recent stress             Eyes:                           no blurry vision, no floaters, no recent vision changes, no wears glasses  or contacts             ENT:                            no hearing loss, no loose or painful teeth, no dentures, last saw dentist 2 years ago, + chronic problems with sinus headaches, congestion  Hematologic:               no easy bruising, no abnormal bleeding, no clotting disorder, no frequent epistaxis             Endocrine:                   no diabetes, does not check CBG's at home                           Physical Exam:              BP (!) 154/90 (BP Location: Right Arm, Patient Position: Sitting)   Pulse (!) 55   Resp 20   Ht 5\' 11"  (1.803 m)   Wt 223 lb (101.2 kg)   SpO2 95% Comment: RA with mask on  BMI 31.10 kg/m              General:                        well-appearing             HEENT:                       Unremarkable              Neck:                           no JVD, no bruits, no adenopathy              Chest:                          clear to auscultation, symmetrical breath sounds, no wheezes, no rhonchi              CV:                              RRR, grade IV/VI holosystolic murmur              Abdomen:                    soft, non-tender, no masses              Extremities:                 warm, well-perfused, pulses palpabld, no LE edema             Rectal/GU                   Deferred             Neuro:                         Grossly non-focal and symmetrical throughout             Skin:                            Clean and dry, no rashes, no breakdown   Diagnostic Tests:   ECHOCARDIOGRAM REPORT       Patient Name:  [facility]  Date of Exam: 03/10/2020  Medical Rec #: [id number]   Height:  69.0 in  Accession #:  [id number]  Weight:    214.6 lb  Date of Birth: November 11, 1940   BSA:     2.129 m  Patient Age:  79 years   BP:      130/72 mmHg  Patient Gender: M       HR:      61 bpm.  Exam Location: [address]   Procedure: 2D Echo, Cardiac Doppler and Color Doppler   Indications:   I34.0    History:    Patient has prior history of Echocardiogram examinations,  most         recent 04/01/2019. MR and Mitral Valve Prolapse,         Arrythmias:Bradycardia; Risk Factors:Hypertension,  Dyslipidemia         and Former Smoker.    Sonographer:  Samule Ohm RDCS  Referring Phys: [phone] JONATHAN J BERRY   IMPRESSIONS    1. Left ventricular ejection fraction, by estimation, is 60 to 65%. The  left ventricle has normal function. The left ventricle has no regional  wall motion abnormalities. There is mild left ventricular hypertrophy.  Left ventricular diastolic parameters  are consistent with Grade II diastolic dysfunction (pseudonormalization).  Elevated left ventricular end-diastolic pressure.  2. Right ventricular systolic function is normal. The right ventricular  size is normal.  3. Left atrial size was mildly dilated.  4. 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  Wraps atria into PV;s   Elevated diatolic E velocity and increased EDP also suggest severe  regurgitation Suggest TEE if clnically indicated . The mitral valve is  myxomatous. Severe mitral valve regurgitation. No evidence of mitral  stenosis.  5. The aortic valve is tricuspid. Aortic valve regurgitation is not  visualized. Mild aortic valve sclerosis is present, with no evidence of  aortic valve stenosis.  6. The inferior vena cava is normal in size with greater than 50%  respiratory variability, suggesting right atrial pressure of 3 mmHg.   FINDINGS  Left Ventricle: Left ventricular ejection fraction, by estimation, is 60  to 65%. The left ventricle has normal function. The left ventricle has no  regional wall motion abnormalities. The left ventricular internal cavity  size was normal in size. There is  mild left ventricular hypertrophy. Left ventricular diastolic parameters  are consistent with Grade II diastolic dysfunction  (pseudonormalization).  Elevated left ventricular end-diastolic pressure.   Right Ventricle: The right ventricular size is normal. No increase in  right ventricular wall thickness. Right ventricular systolic function is  normal.   Left Atrium: Left atrial size was mildly dilated.   Right Atrium: Right atrial size was normal in size.   Pericardium: There is no evidence of pericardial effusion.   Mitral Valve: Posterior leaflet prolapse with eccentric anteriorly  directed MR. Wraps atria into PV;s  Elevated diatolic E velocity and increased EDP also suggest severe  regurgitation Suggest TEE if clnically indicated. The mitral valve is  myxomatous. Severe mitral valve regurgitation. No evidence of mitral valve  stenosis.   Tricuspid Valve: The tricuspid valve is normal in structure. Tricuspid  valve regurgitation is trivial. No evidence of tricuspid stenosis.   Aortic Valve: The aortic valve is tricuspid. Aortic valve regurgitation is  not visualized. Mild aortic valve sclerosis is present, with no evidence  of aortic valve stenosis.   Pulmonic Valve: The pulmonic valve was normal in structure. Pulmonic valve  regurgitation is trivial. No evidence of pulmonic stenosis.   Aorta: The aortic root is normal  in size and structure.   Venous: The inferior vena cava is normal in size with greater than 50%  respiratory variability, suggesting right atrial pressure of 3 mmHg.   IAS/Shunts: No atrial level shunt detected by color flow Doppler.     LEFT VENTRICLE  PLAX 2D  LVIDd:     5.20 cm Diastology  LVIDs:     3.60 cm LV e' medial:  7.07 cm/s  LV PW:     1.20 cm LV E/e' medial: 20.1  LV IVS:    1.20 cm LV e' lateral:  6.85 cm/s  LVOT diam:   2.30 cm LV E/e' lateral: 20.7  LV SV:     66  LV SV Index:  31  LVOT Area:   4.15 cm               3D Volume EF:             3D EF:    63 %             LV EDV:     211 ml             LV ESV:    78 ml             LV SV:    132 ml   RIGHT VENTRICLE       IVC  RV S prime:   16.80 cm/s IVC diam: 2.50 cm  TAPSE (M-mode): 2.7 cm  RVSP:      43.0 mmHg   LEFT ATRIUM       Index    RIGHT ATRIUM      Index  LA diam:    3.80 cm 1.79 cm/m RA Pressure: 8.00 mmHg  LA Vol (A2C):  84.0 ml 39.46 ml/m RA Area:   21.40 cm  LA Vol (A4C):  90.9 ml 42.70 ml/m RA Volume:  72.00 ml 33.82 ml/m  LA Biplane Vol: 89.6 ml 42.09 ml/m  AORTIC VALVE  LVOT Vmax:  73.10 cm/s  LVOT Vmean: 45.200 cm/s  LVOT VTI:  0.159 m    AORTA  Ao Root diam: 3.95 cm  Ao Asc diam: 3.40 cm   MV E velocity: 142.00 cm/s TRICUSPID VALVE  MV A velocity: 55.80 cm/s  TR Peak grad:  35.0 mmHg  MV E/A ratio: 2.54     TR Vmax:    296.00 cm/s               Estimated RAP: 8.00 mmHg               RVSP:      43.0 mmHg                 SHUNTS               Systemic VTI: 0.16 m               Systemic Diam: 2.30 cm   Charlton Haws MD  Electronically signed by Charlton Haws MD  Signature Date/Time: 03/10/2020/9:55:30 AM         TRANSESOPHOGEAL ECHO REPORT      Patient Name:  [facility] Date of Exam: 05/04/2020  Medical Rec #: [id number] Height:    71.0 in  Accession #:  [id number] Weight:    222.0 lb  Date of Birth: 23-Apr-1941 BSA:     2.204 m  Patient Age:  79 years  BP:      119/62 mmHg  Patient Gender: M  HR:      66 bpm.  Exam Location: Inpatient   Procedure: Transesophageal Echo, 3D Echo, Color Doppler and Cardiac  Doppler   Indications:   Mitral Regurgitation    History:     Patient has prior history of Echocardiogram examinations,  most          recent 03/10/2020. Risk Factors:Hypertension and  Dyslipidemia.    Sonographer:   Margreta Journeyichard  Lombardo RDCS  Referring Phys: [id number] Tanna SavoyCHRISTOPHER L Kaiser Fnd [facility]osp [facility] Co IrvineCHUMANN  Diagnosing Phys: Epifanio Lescheshristopher Schumann MD   PROCEDURE: After discussion of the risks and benefits of a TEE, an  informed consent was obtained from the patient. The transesophogeal probe  was passed without difficulty through the esophogus of the patient.  Sedation performed by different physician.  The patient was monitored while under deep sedation. Anesthestetic  sedation was provided intravenously by Anesthesiology: 438mg  of Propofol,  80mg  of Lidocaine. The patient developed no complications during the  procedure.   IMPRESSIONS    1. Flail P2 segment of posterior leaflet of mitral valve causing  eccentric anterior directed MR jet. Severe mitral valve regurgitation. 3D  vena contracta area 0.6 cm^2, consistent with severe MR.  2. Left ventricular ejection fraction, by estimation, is 60 to 65%. The  left ventricle has normal function.  3. Right ventricular systolic function is normal. The right ventricular  size is mildly enlarged.  4. Left atrial size was mildly dilated. No left atrial/left atrial  appendage thrombus was detected.  5. The aortic valve is tricuspid. Aortic valve regurgitation is trivial.  No aortic stenosis is present.  6. Aortic dilatation noted. There is mild dilatation of the ascending  aorta, measuring 38 mm.   FINDINGS  Left Ventricle: Left ventricular ejection fraction, by estimation, is 60  to 65%. The left ventricle has normal function. The left ventricular  internal cavity size was normal in size.   Right Ventricle: The right ventricular size is mildly enlarged. No  increase in right ventricular wall thickness. Right ventricular systolic  function is normal.   Left Atrium: Left atrial size was mildly dilated. No left atrial/left  atrial appendage thrombus was detected.   Right Atrium: Right atrial size was normal in size.   Pericardium: There is no evidence of pericardial  effusion.   Mitral Valve: The mitral valve is abnormal. Severe mitral valve  regurgitation.   Tricuspid Valve: The tricuspid valve is normal in structure. Tricuspid  valve regurgitation is trivial.   Aortic Valve: The aortic valve is tricuspid. Aortic valve regurgitation is  trivial. No aortic stenosis is present.   Pulmonic Valve: The pulmonic valve was grossly normal. Pulmonic valve  regurgitation is mild.   Aorta: Aortic dilatation noted. There is mild dilatation of the ascending  aorta, measuring 38 mm.   IAS/Shunts: No atrial level shunt detected by color flow Doppler.     MR Peak grad:  95.3 mmHg  MR Mean grad:  65.0 mmHg  MR Vmax:     488.00 cm/s  MR Vmean:    383.0 cm/s  MR PISA:     6.28 cm  MR PISA Eff ROA: 50 mm  MR PISA Radius: 1.00 cm   Epifanio Lescheshristopher Schumann MD  Electronically signed by Epifanio Lescheshristopher Schumann MD  Signature Date/Time: 05/04/2020/3:50:15 PM      CT ANGIOGRAPHY CHEST, ABDOMEN AND PELVIS  TECHNIQUE: Multidetector CT imaging through the chest, abdomen and pelvis was performed using the standard protocol during bolus administration of intravenous contrast. Multiplanar reconstructed images and MIPs were obtained  and reviewed to evaluate the vascular anatomy.  CONTRAST: 47mL ISOVUE-370 IOPAMIDOL (ISOVUE-370) INJECTION 76%  COMPARISON: None.  FINDINGS: CTA CHEST FINDINGS  Cardiovascular: Diffuse coronary artery calcifications are noted. The heart size is within normal limits.  Mediastinum/Nodes: Incidentally noted 11 mm left thyroid nodule. No enlarged hilar, mediastinal, supraclavicular, or axillary lymph nodes.  Lungs/Pleura: Minimal bibasilar atelectasis.  Musculoskeletal: No chest wall abnormality. No acute or significant osseous findings.  Vasculature: Evaluation of the ascending thoracic aorta is limited due to cardiac motion. The aortic root appears dilated measuring up to 4.4 x 4.1 cm at the level  of sinus of Valsalva. No aneurysm of the ascending thoracic aorta, however evaluation is limited due to cardiac motion.  The left vertebral artery originates directly from the aortic arch. 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  No dilatation of the aortic arch or descending thoracic aorta. No aortic dissection.  Review of the MIP images confirms the above findings.  CTA ABDOMEN AND PELVIS FINDINGS  VASCULAR  Aorta: Mild scattered calcified plaque without aneurysmal dilatation or dissection.  Celiac: Patent without evidence of aneurysm, dissection, vasculitis or significant stenosis.  SMA: Patent without evidence of aneurysm, dissection, vasculitis or significant stenosis.  Renals: Both renal arteries are patent without evidence of aneurysm, dissection, vasculitis, fibromuscular dysplasia or significant stenosis.  IMA: Patent without evidence of aneurysm, dissection, vasculitis or significant stenosis.  Inflow: Patent without evidence of aneurysm, dissection, vasculitis or significant stenosis.  Veins: No obvious venous abnormality within the limitations of this arterial phase study.  Review of the MIP images confirms the above findings.  NON-VASCULAR  Hepatobiliary: No focal liver abnormality is seen. No gallstones, gallbladder wall thickening, or biliary dilatation.  Pancreas: Unremarkable. No pancreatic ductal dilatation or surrounding inflammatory changes.  Spleen: Normal in size without focal abnormality.  Adrenals/Urinary Tract: 2 left renal simple cysts with the larger measuring 7.4 cm in diameter. 9 mm simple cyst present at the upper pole of the right kidney. Adrenal glands are normal in appearance.  Stomach/Bowel: Small hiatal hernia is present. Extensive sigmoid and descending colon diverticulosis without evidence of acute diverticulitis. Normal appendix. No bowel dilatation to  indicate ileus or obstruction.  Lymphatic: No enlarged abdominal or pelvic lymph nodes.  Reproductive: Prostate is mildly enlarged.  Other: None  Musculoskeletal: No acute or significant osseous findings.  Review of the MIP images confirms the above findings.  IMPRESSION: 1. No aortic dissection identified. The evaluation of the aortic root and ascending thoracic aorta is limited due to cardiac motion. 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 This measurement is limited due to motion. 2. No aortic dissection or aneurysm.   Electronically Signed By: Acquanetta Belling M.D. On: 05/11/2020 09:23      RIGHT/LEFT HEART CATH AND CORONARY ANGIOGRAPHY    Conclusion    Prox RCA to Mid RCA lesion is 30% stenosed.  Mid LAD lesion is 30% stenosed.  Hemodynamic findings consistent with mitral valve regurgitation.  Rawson Ritchis a 80 y.o.male   [id number] LOCATION: FACILITY: [facility]  PHYSICIAN: Nanetta Batty, M.D. [birth year]   DATE OF PROCEDURE: 08/30/2020  DATE OF DISCHARGE:     CARDIAC CATHETERIZATION    History obtained from chart review.Endy Ritchis a 80 y.o.moderatelyoverweight, married Caucasian male, father of 2, grandfather to 2 grandchildren whose son [name] is also a patient of mine, as is his wife. I saw him1/28/2022. He was worked up for palpitations and  had an echo that showed severe MR with mild mitral valve prolapse and mild LV size and function. His other problems include hypertension and hyperlipidemia. He lives an active lifestyle and is actually heading out to [place] to go coyote hunting for a week. He has changed his diet and has lost 15 pounds. He has cut out caffeine and alcohol. He has had 2 negative sleep studies. He is otherwise asymptomatic. Since I saw him several years ago he has seen Tereso Newcomer in the office for evaluation of palpitations. A 2-D echo revealed normal LV function with  holosystolic mitral valve prolapse, mild MR and moderate TR. A 48 hour Holter monitor showed PVCs, short runs of nonsustained ventricular tachycardia and short runs of SVT. Since stopping his diuretic his palpitations have improved.  I did refer him to Dr. Ladona Ridgel who recommended conservative therapy noting he had PACs and PVCs. He recommended avoidance of caffeine and alcohol. He is referred back by Dr. Duaine Dredge for follow-up of his mitral regurgitation because of a murmur which he thought was louder than he had remembered. Repeat 2D echo performed 11/14/2017 revealed moderate to severe MR with posterior leaflet prolapse, normal LV size and function. He is completely asymptomatic. He purchased his [id number] [place] rode [place] [employer] [place] 150 mph.  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 He had a transesophageal echo performed by Dr. Schumann1/07/2020 revealing a flail P2 segment with severe MR. He wishes to proceed with mini mitral repair with Dr. Cornelius Moras.   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 He is a great candidate for a "mini mitral" repair for severe MR and normal LV function. The radial sheath was removed and a TR band was placed on the right wrist to achieve patent hemostasis. The patient left lab in stable condition. He will be discharged home later today as an outpatient and will see me back in the office next week for follow-up.  Nanetta Batty. MD, [facility] 08/30/2020 9:27 AM      Surgeon Notes    08/18/2020 2:31 PM Operative Note signed by Drema Halon, MD    08/13/2020 12:11 PM Brief Op Note signed by Drema Halon, MD    Indications  Severe mitral regurgitation [I34.0 (ICD-10-CM)]   Procedural  Details  Technical Details PROCEDURE DESCRIPTION:   The patient was brought to the second floor [facility] lab in the postabsorptive state. He was premedicated with IV Versed and fentanyl. He was premedicated for contrast allergy prophylaxis with prednisone and Benadryl. His right wristwas prepped and shaved in usual sterile fashion. Xylocaine 1% was used for local anesthesia. A 6 French sheath was inserted into the right radial artery using standard Seldinger technique. The patient received 5000 units of heparin intravenously. A 5 [place] TIG catheter and pigtail catheter were used for selective coronary angiography and obtain left heart pressures. A 5 French sheath was inserted into the right antecubital vein. A 5 French balloontipped Swan-Ganz catheter was then advanced through the right heart chambers obtaining sequential pressures and pulmonary artery blood samples for the determination of Fick cardiac output. Isovue dye is used for the entirety of the case. Retrograde aortic, ventricular and pullback pressures were recorded. Radial cocktail was administered via the SideArm sheath.  Estimated blood loss <50 mL.  During this procedure medications were administered to achieve and maintain moderate conscious sedation while the patient's heart rate, blood pressure, and oxygen saturation were continuously monitored and I was present face-to-face 100% of this time.   Medications (Filter: Administrations occurring from [phone] to 0926 on 08/30/20)  Heparin (Porcine) in NaCl 1000-0.9 UT/500ML-% SOLN (mL) Total volume:  1,000 mL  Date/Time Rate/Dose/Volume Action   08/30/20 0838 500 mL Given   0838 500 mL Given    fentaNYL (SUBLIMAZE) injection (mcg) Total dose:  25 mcg  Date/Time Rate/Dose/Volume Action   08/30/20 0849 25 mcg Given    midazolam (VERSED) injection (mg) Total dose:  1 mg  Date/Time Rate/Dose/Volume Action   08/30/20 0849 1 mg Given     lidocaine (PF) (XYLOCAINE) 1 % injection (mL) Total volume:  5 mL  Date/Time Rate/Dose/Volume Action   08/30/20 0901 5 mL Given    Radial Cocktail (Verapamil 2.5 mg, NTG, Lidocaine) (mL) Total volume:  5 mL  Date/Time Rate/Dose/Volume Action   08/30/20 0905 5 mL Given    heparin sodium (porcine) injection (Units) Total dose:  5,000 Units  Date/Time Rate/Dose/Volume Action   08/30/20 0908 5,000 Units Given    iohexol (OMNIPAQUE) 350 MG/ML injection (mL) Total volume:  45 mL  Date/Time Rate/Dose/Volume Action   08/30/20 0923 45 mL Given     Sedation Time  Sedation Time Physician-1: 24 minutes   Contrast  Medication Name Total Dose  iohexol (OMNIPAQUE) 350 MG/ML injection 45 mL    Radiation/Fluoro  Fluoro time: 3.9 (min) DAP: 56068 (mGycm2) Cumulative Air Kerma: 781 (mGy)   Coronary Findings   Diagnostic Dominance: Right  Left Anterior Descending  Mid LAD lesion is 30% stenosed.  Right Coronary Artery  Prox RCA to Mid RCA lesion is 30% stenosed.   Intervention   No interventions have been documented.  Right Heart  Right Heart Pressures Hemodynamic findings consistent with mitral valve regurgitation. Right atrial pressure- 10/0 Right ventricular pressure- 37/3 Pulmonary artery pressure-39/10, mean 24 Pulmonary wedge pressure-A-wave 17, V wave 20, mean 13 LVEDP- 14 Cardiac output- 8.6 L/min with an index of 3.9 L/min/m.   Coronary Diagrams   Diagnostic Dominance: Right    Intervention    Implants    No implant documentation for this case.    Syngo Images  Show images for CARDIAC CATHETERIZATION  Images on Long Term Storage  Show images for Loeb, Mikey Maffett to Procedure Log  Procedure Log     Hemo Data  Flowsheet Row Most Recent Value  Fick Cardiac Output 8.57 L/min  Fick Cardiac Output Index 3.88 (L/min)/BSA  RV Systolic Pressure 37 mmHg  RV Diastolic  Pressure 3 mmHg  RV EDP 10 mmHg  PA Systolic Pressure 39 mmHg  PA Diastolic Pressure 10 mmHg  PA Mean 24 mmHg  PW A Wave 17 mmHg  PW V Wave 20 mmHg  PW Mean 13 mmHg  AO Systolic Pressure 134 mmHg  AO Diastolic Pressure 67 mmHg  AO Mean 95 mmHg  LV Systolic Pressure 154 mmHg  LV Diastolic Pressure 7 mmHg  LV EDP 14 mmHg  AOp Systolic Pressure 151 mmHg  AOp Diastolic Pressure 71 mmHg  AOp Mean Pressure 104 mmHg  LVp Systolic Pressure 149 mmHg  LVp Diastolic Pressure 6 mmHg  LVp EDP Pressure 15 mmHg  QP/QS 1  TPVR Index 6.19 HRUI  TSVR Index 1.29 HRUI  TPVR/TSVR Ratio 4.8      Impression:  Patient has mitral valve prolapse withat leaststage Cand  possibly early stage Dsevere symptomatic primary mitral regurgitation. He complains of increasing fatigue and he has noticed a tendency to get short of breath if walking up a flight of stairs.  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 There is an eccentric jet of regurgitation. Quantification of mitralregurgitation using PISAconfirmed findings consistent with severe mitral regurgitation. Left ventricular size and systolic function appears normal. There is mild left atrial enlargement. No other significant abnormalities are noted.Catheterization is notable for the absence of significant coronary artery disease and reveals normal right heart pressures. CT angiography revealed no contraindications to peripheral cannulation for surgery.  Options at this time include continued close follow-up on medical therapy versus elective mitral valve repair. Based upon review of the patient's transesophageal echocardiogram I would  anticipate greater than 95% likelihood of successful and durable mitral valve repair with low operative risks.     Plan:  The patient was again counseled at length regardinghisdiagnosis of severe primary mitral regurgitation. We reviewed the results of his diagnostic tests including images from the most recent echocardiograms, catheterization and CTA. We discussed the natural history of mitral regurgitation as well as alternative treatment strategies. We discussed the impact of hisage, current state of health, and any significant comorbid medical problems on clinical decision making. We went on to discuss the indications, risks and potential benefits of mitral valve repair as well as the timing of surgical intervention. The rationale for elective surgery has been explained, including a comparison between surgery and continued medical therapy with close follow-up. The likelihood of successful and durable mitral valve repair has been discussed with particular reference to the findings of the most recent echocardiogram. Based upon these findings and previous experience, I have quoted a greater than 95percent likelihood of successful valve repair with less than 1percent risk of mortality or major morbidity. Alternative surgical approaches have been discussed including a comparison between conventional sternotomy and minimally-invasive techniques. The relative risks and benefits of each have been reviewed as they pertain to the patient's specific circumstances, and expectations for the patient's postoperative convalescence has been discussed.   The patient understands and accepts all potential risks of surgery including but not limited to risk of death, stroke or other neurologic complication, myocardial infarction, congestive heart failure, respiratory failure, renal failure, bleeding requiring transfusion and/or reexploration, arrhythmia, heart block or bradycardia requiring permanent  pacemaker insertion, infection or other wound complications, pneumonia, pleural and/or pericardial effusion, pulmonary embolus, aortic dissection or other major vascular complication, or other immediate or delayed complications related to valve repair or replacement including but not limited to recurrent or persistent mitral regurgitation and/or mitral stenosis, LV outflow tract obstruction, aortic insufficiency, paravalvular leak, posterior AV groove disruption, structural valve deterioration and failure, thrombosis, embolization, or endocarditis.  Specific risks potentially related to the minimally-invasive approach were discussed at length, including but not limited to risk of conversion to full or partial sternotomy, aortic dissection or other major vascular complication, unilateral acute lung injury or pulmonary edema, phrenic nerve dysfunction or paralysis, rib fracture, chronic pain, lung hernia, or lymphocele. All of questions have been answered.      Salvatore Decent. Cornelius Moras, MD 09/20/2020 4:08 PM

## 2020-09-20 NOTE — Progress Notes (Signed)
PCP - Dr. Mosetta Putt Cardiologist - Dr. Allyson Sabal  Chest x-ray - 09/20/20 EKG - 09/20/20 Stress Test - 06/18/15 ECHO - 05/04/20 Cardiac Cath - 08/30/20  Sleep Study - denies CPAP - denies  Blood Thinner Instructions:n/a Aspirin Instructions:n/a  COVID TEST- 09/20/20; pending.  Anesthesia review: Yes  Patient denies shortness of breath, fever, cough and chest pain at PAT appointment   All instructions explained to the patient, with a verbal understanding of the material. Patient agrees to go over the instructions while at home for a better understanding. Patient also instructed to self quarantine after being tested for COVID-19. The opportunity to ask questions was provided.

## 2020-09-20 NOTE — Patient Instructions (Signed)
  Have nothing to eat or drink after midnight the night before surgery.  On the morning of surgery take only Synthroid with a sip of water.

## 2020-09-20 NOTE — Progress Notes (Signed)
Preop MVR has been completed.   Preliminary results in CV Proc.   Blanch Media 09/20/2020 1:28 PM

## 2020-09-20 NOTE — Progress Notes (Signed)
[address]       [name] [address]             [phone]     CARDIOTHORACIC SURGERY OFFICE NOTE  Referring Provider is Runell Gess, MD PCP is Mosetta Putt, MD   HPI:  Patient is 80 year old moderately overweight male with history of mitral valve prolapse, mitral regurgitation, hypertension, hyperlipidemia, hypothyroidism, and asymptomatic cerebrovascular disease who returns to the office today for follow-up prior to planned elective mitral valve repair for treatment of mitral valve prolapse with severe symptomatic primary mitral regurgitation.    He was initially seen in consultation on May 10, 2020 and he was last seen here in our office on Sep 06, 2020.  Since then he has been seen again in follow-up by Dr. Ezzard Standing who has cleared him to proceed with elective surgery.  The patient has also been seen in follow-up by Dr. Allyson Sabal and he reports no new problems or complaints.  He is looking forward to proceeding with surgery later this week.   Current Outpatient Medications  Medication Sig Dispense Refill  . Ascorbic Acid (VITAMIN C) 1000 MG tablet Take 1,000 mg by mouth daily.    . Cholecalciferol (VITAMIN D) 50 MCG (2000 UT) tablet Take 2,000 Units by mouth daily.    [name] ezetimibe (ZETIA) 10 MG tablet Take 10 mg by mouth at bedtime.    . fluticasone (FLONASE) 50 MCG/ACT nasal spray Place 1 spray into both nostrils at bedtime.    [name] ibuprofen (ADVIL) 200 MG tablet Take 400 mg by mouth every 6 (six) hours as needed for headache (Sinus).    [name] levothyroxine (SYNTHROID) 75 MCG tablet Take 75 mcg by mouth at bedtime.    [name] losartan (COZAAR) 100 MG tablet Take 1 tablet (100 mg total) by mouth daily. 90 tablet 3  . rosuvastatin (CRESTOR) 40 MG tablet Take 40 mg by mouth at bedtime.    . sodium chloride (OCEAN) 0.65 % SOLN nasal spray Place 1 spray into both nostrils as needed for congestion.     Current Facility-Administered Medications  Medication Dose Route  Frequency Provider Last Rate Last Admin  . sodium chloride flush (NS) 0.9 % injection 3 mL  3 mL Intravenous Q12H Runell Gess, MD          Physical Exam:   BP (!) 172/76 (BP Location: Right Arm, Patient Position: Sitting)   Pulse 60   Resp 20   Wt 224 lb (101.6 kg)   SpO2 99%   BMI 31.24 kg/m   General:  Well-appearing  Chest:   Clear to auscultation  CV:   Regular rate and rhythm with prominent holosystolic murmur  Incisions:  n/a  Abdomen:  Soft nontender  Extremities:  Warm and well-perfused  Diagnostic Tests:  n/a   Impression:  Patient has mitral valve prolapse with at least stage C and possibly early stage D severe symptomatic primary mitral regurgitation.   He complains of increasing fatigue and he has noticed a tendency to get short of breath if walking up a flight of stairs.  I have personally reviewed the patient's recent transthoracic and transesophageal echocardiograms, CT angiograms, and diagnostic cardiac catheterization.   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 There is an eccentric jet of regurgitation. Quantification of mitralregurgitation using PISAconfirmed findings consistent with severe mitral regurgitation. Left  ventricular size and systolic function appears normal. There is mild left atrial enlargement. No other significant abnormalities are noted.  Catheterization is notable for the absence of significant coronary artery disease and reveals normal right heart pressures.  CT angiography revealed no contraindications to peripheral cannulation for surgery.  Options at this time include continued close follow-up on medical therapy versus elective mitral valve repair. Based upon review of the patient's transesophageal echocardiogram I would anticipate greater than 95% likelihood of successful and  durable mitral valve repair with low operative risks.     Plan:  The patient was again counseled at length regarding his diagnosis of severe primary mitral regurgitation.  We reviewed the results of his diagnostic tests including images from the most recent echocardiograms, catheterization and CTA.  We discussed the natural history of mitral regurgitation as well as alternative treatment strategies.  We discussed the impact of his age, current state of health, and any significant comorbid medical problems on clinical decision making.  We went on to discuss the indications, risks and potential benefits of mitral valve repair as well as the timing of surgical intervention.  The rationale for elective surgery has been explained, including a comparison between surgery and continued medical therapy with close follow-up.  The likelihood of successful and durable mitral valve repair has been discussed with particular reference to the findings of the most recent echocardiogram.  Based upon these findings and previous experience, I have quoted a greater than 95 percent likelihood of successful valve repair with less than 1 percent risk of mortality or major morbidity.  Alternative surgical approaches have been discussed including a comparison between conventional sternotomy and minimally-invasive techniques.  The relative risks and benefits of each have been reviewed as they pertain to the patient's specific circumstances, and expectations for the patient's postoperative convalescence has been discussed.    The patient understands and accepts all potential risks of surgery including but not limited to risk of death, stroke or other neurologic complication, myocardial infarction, congestive heart failure, respiratory failure, renal failure, bleeding requiring transfusion and/or reexploration, arrhythmia, heart block or bradycardia requiring permanent pacemaker insertion, infection or other wound complications,  pneumonia, pleural and/or pericardial effusion, pulmonary embolus, aortic dissection or other major vascular complication, or other immediate or delayed complications related to valve repair or replacement including but not limited to recurrent or persistent mitral regurgitation and/or mitral stenosis, LV outflow tract obstruction, aortic insufficiency, paravalvular leak, posterior AV groove disruption, structural valve deterioration and failure, thrombosis, embolization, or endocarditis.  Specific risks potentially related to the minimally-invasive approach were discussed at length, including but not limited to risk of conversion to full or partial sternotomy, aortic dissection or other major vascular complication, unilateral acute lung injury or pulmonary edema, phrenic nerve dysfunction or paralysis, rib fracture, chronic pain, lung hernia, or lymphocele. All of questions have been answered.     I spent in excess of 15 minutes during the conduct of this office consultation and >50% of this time involved direct face-to-face encounter with the patient for counseling and/or coordination of their care.    Salvatore Decent. Cornelius Moras, MD 09/20/2020 4:08 PM

## 2020-09-21 MED ORDER — VANCOMYCIN HCL 1000 MG IV SOLR
INTRAVENOUS | Status: DC
  Filled 2020-09-21 (×2): qty 1000

## 2020-09-21 MED ORDER — VANCOMYCIN HCL 1250 MG/250ML IV SOLN
1250.0000 mg | INTRAVENOUS | Status: DC
  Filled 2020-09-21: qty 250

## 2020-09-21 MED ORDER — DEXMEDETOMIDINE HCL IN NACL 400 MCG/100ML IV SOLN
0.1000 ug/kg/h | INTRAVENOUS | Status: AC
  Administered 2020-09-22: .5 ug/kg/h via INTRAVENOUS
  Filled 2020-09-21: qty 100

## 2020-09-21 MED ORDER — MILRINONE LACTATE IN DEXTROSE 20-5 MG/100ML-% IV SOLN
0.3000 ug/kg/min | INTRAVENOUS | Status: DC
  Filled 2020-09-21: qty 100

## 2020-09-21 MED ORDER — POTASSIUM CHLORIDE 2 MEQ/ML IV SOLN
80.0000 meq | INTRAVENOUS | Status: DC
  Filled 2020-09-21: qty 40

## 2020-09-21 MED ORDER — NITROGLYCERIN IN D5W 200-5 MCG/ML-% IV SOLN
2.0000 ug/min | INTRAVENOUS | Status: AC
  Administered 2020-09-22: 5 ug/min via INTRAVENOUS
  Filled 2020-09-21: qty 250

## 2020-09-21 MED ORDER — CEFAZOLIN SODIUM-DEXTROSE 2-4 GM/100ML-% IV SOLN
2.0000 g | INTRAVENOUS | Status: DC
  Filled 2020-09-21: qty 100

## 2020-09-21 MED ORDER — VANCOMYCIN HCL 1500 MG/300ML IV SOLN
1500.0000 mg | INTRAVENOUS | Status: AC
  Administered 2020-09-22: 1500 mg via INTRAVENOUS
  Filled 2020-09-21: qty 300

## 2020-09-21 MED ORDER — NOREPINEPHRINE 4 MG/250ML-% IV SOLN
0.0000 ug/min | INTRAVENOUS | Status: DC
  Filled 2020-09-21: qty 250

## 2020-09-21 MED ORDER — MANNITOL 20 % IV SOLN
Freq: Once | INTRAVENOUS | Status: DC
  Filled 2020-09-21 (×2): qty 13

## 2020-09-21 MED ORDER — TRANEXAMIC ACID (OHS) BOLUS VIA INFUSION
15.0000 mg/kg | INTRAVENOUS | Status: AC
  Administered 2020-09-22: 1287 mg via INTRAVENOUS
  Filled 2020-09-21 (×2): qty 1287

## 2020-09-21 MED ORDER — GLUTARALDEHYDE 0.625% SOAKING SOLUTION
TOPICAL | Status: DC
  Filled 2020-09-21 (×2): qty 50

## 2020-09-21 MED ORDER — MANNITOL 20 % IV SOLN
INTRAVENOUS | Status: DC
  Filled 2020-09-21 (×2): qty 13

## 2020-09-21 MED ORDER — PLASMA-LYTE 148 IV SOLN
INTRAVENOUS | Status: DC
  Filled 2020-09-21: qty 2.5

## 2020-09-21 MED ORDER — PHENYLEPHRINE HCL-NACL 20-0.9 MG/250ML-% IV SOLN
30.0000 ug/min | INTRAVENOUS | Status: AC
  Administered 2020-09-22: 50 ug/min via INTRAVENOUS
  Filled 2020-09-21: qty 250

## 2020-09-21 MED ORDER — TRANEXAMIC ACID 1000 MG/10ML IV SOLN
1.5000 mg/kg/h | INTRAVENOUS | Status: AC
  Administered 2020-09-22: 1.5 mg/kg/h via INTRAVENOUS
  Filled 2020-09-21 (×3): qty 25

## 2020-09-21 MED ORDER — SODIUM CHLORIDE 0.9 % IV SOLN
INTRAVENOUS | Status: DC
  Filled 2020-09-21: qty 30

## 2020-09-21 MED ORDER — INSULIN REGULAR(HUMAN) IN NACL 100-0.9 UT/100ML-% IV SOLN
INTRAVENOUS | Status: AC
  Administered 2020-09-22: 1.1 [IU]/h via INTRAVENOUS
  Filled 2020-09-21: qty 100

## 2020-09-21 MED ORDER — TRANEXAMIC ACID (OHS) PUMP PRIME SOLUTION
2.0000 mg/kg | INTRAVENOUS | Status: DC
  Filled 2020-09-21 (×2): qty 2.03

## 2020-09-21 MED ORDER — CEFAZOLIN SODIUM-DEXTROSE 2-4 GM/100ML-% IV SOLN
2.0000 g | INTRAVENOUS | Status: AC
  Administered 2020-09-22 (×2): 2 g via INTRAVENOUS
  Filled 2020-09-21: qty 100

## 2020-09-21 MED ORDER — EPINEPHRINE HCL 5 MG/250ML IV SOLN IN NS
0.0000 ug/min | INTRAVENOUS | Status: DC
Start: 2020-09-22 — End: 2020-09-22
  Filled 2020-09-21: qty 250

## 2020-09-21 NOTE — Anesthesia Preprocedure Evaluation (Addendum)
Anesthesia Evaluation  Patient identified by MRN, date of birth, ID band Patient awake    Reviewed: Allergy & Precautions, NPO status , Patient's Chart, lab work & pertinent test results  Airway Mallampati: II  TM Distance: >3 FB Neck ROM: Full    Dental  (+) Teeth Intact   Pulmonary neg pulmonary ROS, former smoker,    Pulmonary exam normal        Cardiovascular hypertension, Pt. on medications + Valvular Problems/Murmurs MR  Rhythm:Regular + Systolic murmurs    Neuro/Psych negative neurological ROS  negative psych ROS   GI/Hepatic negative GI ROS, Neg liver ROS,   Endo/Other  Hypothyroidism   Renal/GU negative Renal ROS  negative genitourinary   Musculoskeletal  (+) Arthritis , Osteoarthritis,    Abdominal (+)  Abdomen: soft. Bowel sounds: normal.  Peds  Hematology   Anesthesia Other Findings   Reproductive/Obstetrics                            Anesthesia Physical Anesthesia Plan  ASA: III  Anesthesia Plan: General   Post-op Pain Management:    Induction: Intravenous  PONV Risk Score and Plan: 2 and Ondansetron, Midazolam and Treatment may vary due to age or medical condition  Airway Management Planned: Mask and Double Lumen EBT  Additional Equipment: Arterial line, CVP, PA Cath, TEE, 3D TEE and Ultrasound Guidance Line Placement  Intra-op Plan:   Post-operative Plan: Possible Post-op intubation/ventilation  Informed Consent: I have reviewed the patients History and Physical, chart, labs and discussed the procedure including the risks, benefits and alternatives for the proposed anesthesia with the patient or authorized representative who has indicated his/her understanding and acceptance.     Dental advisory given  Plan Discussed with: CRNA  Anesthesia Plan Comments: (ECHO 01/22: IMPRESSIONS  1. Flail P2 segment of posterior leaflet of mitral valve causing   eccentric anterior directed MR jet. Severe mitral valve regurgitation. 3D  vena contracta area 0.6 cm^2, consistent with severe MR.  2. Left ventricular ejection fraction, by estimation, is 60 to 65%. The  left ventricle has normal function.  3. Right ventricular systolic function is normal. The right ventricular  size is mildly enlarged.  4. Left atrial size was mildly dilated. No left atrial/left atrial  appendage thrombus was detected.  5. The aortic valve is tricuspid. Aortic valve regurgitation is trivial.  No aortic stenosis is present.  6. Aortic dilatation noted. There is mild dilatation of the ascending  aorta, measuring 38 mm.  Lab Results      Component                Value               Date                      WBC                      5.3                 09/20/2020                HGB                      14.3                09/20/2020  HCT                      41.1                09/20/2020                MCV                      94.3                09/20/2020                PLT                      144 (L)             09/20/2020           Lab Results      Component                Value               Date                      NA                       140                 09/20/2020                K                        4.1                 09/20/2020                CO2                      22                  09/20/2020                GLUCOSE                  100 (H)             09/20/2020                BUN                      12                  09/20/2020                CREATININE               0.98                09/20/2020                CALCIUM                  8.9                 09/20/2020                GFRNONAA                 >  60                 09/20/2020                GFRAA                    71                  05/03/2020          )       Anesthesia Quick Evaluation

## 2020-09-22 ENCOUNTER — Other Ambulatory Visit: Payer: Self-pay

## 2020-09-22 ENCOUNTER — Encounter (HOSPITAL_COMMUNITY): Payer: Self-pay | Admitting: Thoracic Surgery (Cardiothoracic Vascular Surgery)

## 2020-09-22 ENCOUNTER — Inpatient Hospital Stay (HOSPITAL_COMMUNITY): Payer: 59 | Admitting: Certified Registered Nurse Anesthetist

## 2020-09-22 ENCOUNTER — Encounter (HOSPITAL_COMMUNITY)
Admission: RE | Disposition: A | Discharge status: Home / Self Care | Payer: Self-pay | Source: Home / Self Care | Attending: Thoracic Surgery (Cardiothoracic Vascular Surgery)

## 2020-09-22 ENCOUNTER — Inpatient Hospital Stay (HOSPITAL_COMMUNITY)
Admission: RE | Admit: 2020-09-22 | Discharge: 2020-09-29 | DRG: 220 | Disposition: A | Discharge status: Home / Self Care | Payer: 59 | Attending: Thoracic Surgery (Cardiothoracic Vascular Surgery) | Admitting: Thoracic Surgery (Cardiothoracic Vascular Surgery)

## 2020-09-22 ENCOUNTER — Inpatient Hospital Stay (HOSPITAL_COMMUNITY): Payer: 59

## 2020-09-22 DIAGNOSIS — I97190 Other postprocedural cardiac functional disturbances following cardiac surgery: Secondary | ICD-10-CM | POA: Diagnosis not present

## 2020-09-22 DIAGNOSIS — R110 Nausea: Secondary | ICD-10-CM | POA: Diagnosis not present

## 2020-09-22 DIAGNOSIS — I2510 Atherosclerotic heart disease of native coronary artery without angina pectoris: Secondary | ICD-10-CM | POA: Diagnosis present

## 2020-09-22 DIAGNOSIS — I340 Nonrheumatic mitral (valve) insufficiency: Secondary | ICD-10-CM | POA: Diagnosis present

## 2020-09-22 DIAGNOSIS — Z8249 Family history of ischemic heart disease and other diseases of the circulatory system: Secondary | ICD-10-CM | POA: Diagnosis not present

## 2020-09-22 DIAGNOSIS — R001 Bradycardia, unspecified: Secondary | ICD-10-CM | POA: Diagnosis not present

## 2020-09-22 DIAGNOSIS — Z79890 Hormone replacement therapy (postmenopausal): Secondary | ICD-10-CM

## 2020-09-22 DIAGNOSIS — J9811 Atelectasis: Secondary | ICD-10-CM | POA: Diagnosis not present

## 2020-09-22 DIAGNOSIS — E663 Overweight: Secondary | ICD-10-CM | POA: Diagnosis present

## 2020-09-22 DIAGNOSIS — M1991 Primary osteoarthritis, unspecified site: Secondary | ICD-10-CM | POA: Diagnosis present

## 2020-09-22 DIAGNOSIS — I480 Paroxysmal atrial fibrillation: Secondary | ICD-10-CM | POA: Diagnosis not present

## 2020-09-22 DIAGNOSIS — Z823 Family history of stroke: Secondary | ICD-10-CM | POA: Diagnosis not present

## 2020-09-22 DIAGNOSIS — Z006 Encounter for examination for normal comparison and control in clinical research program: Secondary | ICD-10-CM

## 2020-09-22 DIAGNOSIS — K449 Diaphragmatic hernia without obstruction or gangrene: Secondary | ICD-10-CM | POA: Diagnosis present

## 2020-09-22 DIAGNOSIS — Z87891 Personal history of nicotine dependence: Secondary | ICD-10-CM

## 2020-09-22 DIAGNOSIS — Z9889 Other specified postprocedural states: Secondary | ICD-10-CM

## 2020-09-22 DIAGNOSIS — Z79899 Other long term (current) drug therapy: Secondary | ICD-10-CM

## 2020-09-22 DIAGNOSIS — D62 Acute posthemorrhagic anemia: Secondary | ICD-10-CM | POA: Diagnosis not present

## 2020-09-22 DIAGNOSIS — I341 Nonrheumatic mitral (valve) prolapse: Principal | ICD-10-CM | POA: Diagnosis present

## 2020-09-22 DIAGNOSIS — M549 Dorsalgia, unspecified: Secondary | ICD-10-CM | POA: Diagnosis not present

## 2020-09-22 DIAGNOSIS — I10 Essential (primary) hypertension: Secondary | ICD-10-CM | POA: Diagnosis present

## 2020-09-22 DIAGNOSIS — D6959 Other secondary thrombocytopenia: Secondary | ICD-10-CM | POA: Diagnosis not present

## 2020-09-22 DIAGNOSIS — E785 Hyperlipidemia, unspecified: Secondary | ICD-10-CM | POA: Diagnosis present

## 2020-09-22 DIAGNOSIS — E8770 Fluid overload, unspecified: Secondary | ICD-10-CM | POA: Diagnosis present

## 2020-09-22 DIAGNOSIS — Y838 Other surgical procedures as the cause of abnormal reaction of the patient, or of later complication, without mention of misadventure at the time of the procedure: Secondary | ICD-10-CM | POA: Diagnosis not present

## 2020-09-22 DIAGNOSIS — Z91041 Radiographic dye allergy status: Secondary | ICD-10-CM | POA: Diagnosis not present

## 2020-09-22 DIAGNOSIS — K5900 Constipation, unspecified: Secondary | ICD-10-CM | POA: Diagnosis present

## 2020-09-22 DIAGNOSIS — I77810 Thoracic aortic ectasia: Secondary | ICD-10-CM | POA: Diagnosis present

## 2020-09-22 DIAGNOSIS — Z20822 Contact with and (suspected) exposure to covid-19: Secondary | ICD-10-CM | POA: Diagnosis present

## 2020-09-22 DIAGNOSIS — E039 Hypothyroidism, unspecified: Secondary | ICD-10-CM | POA: Diagnosis present

## 2020-09-22 DIAGNOSIS — I700 Atherosclerosis of aorta: Secondary | ICD-10-CM | POA: Diagnosis present

## 2020-09-22 DIAGNOSIS — T447X5A Adverse effect of beta-adrenoreceptor antagonists, initial encounter: Secondary | ICD-10-CM | POA: Diagnosis not present

## 2020-09-22 DIAGNOSIS — K5730 Diverticulosis of large intestine without perforation or abscess without bleeding: Secondary | ICD-10-CM | POA: Diagnosis present

## 2020-09-22 DIAGNOSIS — J90 Pleural effusion, not elsewhere classified: Secondary | ICD-10-CM | POA: Diagnosis not present

## 2020-09-22 HISTORY — DX: Other specified postprocedural states: Z98.890

## 2020-09-22 HISTORY — PX: MITRAL VALVE REPAIR: SHX2039

## 2020-09-22 HISTORY — PX: TEE WITHOUT CARDIOVERSION: SHX5443

## 2020-09-22 LAB — POCT I-STAT EG7
Acid-base deficit: 1 mmol/L (ref 0.0–2.0)
Bicarbonate: 24.8 mmol/L (ref 20.0–28.0)
Calcium, Ion: 1.02 mmol/L — ABNORMAL LOW (ref 1.15–1.40)
HCT: 26 % — ABNORMAL LOW (ref 39.0–52.0)
Hemoglobin: 8.8 g/dL — ABNORMAL LOW (ref 13.0–17.0)
O2 Saturation: 87 %
Potassium: 4.3 mmol/L (ref 3.5–5.1)
Sodium: 140 mmol/L (ref 135–145)
TCO2: 26 mmol/L (ref 22–32)
pCO2, Ven: 45.9 mmHg (ref 44.0–60.0)
pH, Ven: 7.341 (ref 7.250–7.430)
pO2, Ven: 57 mmHg — ABNORMAL HIGH (ref 32.0–45.0)

## 2020-09-22 LAB — POCT I-STAT 7, (LYTES, BLD GAS, ICA,H+H)
Acid-Base Excess: 0 mmol/L (ref 0.0–2.0)
Acid-Base Excess: 1 mmol/L (ref 0.0–2.0)
Acid-Base Excess: 0 mmol/L (ref 0.0–2.0)
Acid-base deficit: 1 mmol/L (ref 0.0–2.0)
Acid-base deficit: 3 mmol/L — ABNORMAL HIGH (ref 0.0–2.0)
Acid-base deficit: 6 mmol/L — ABNORMAL HIGH (ref 0.0–2.0)
Acid-base deficit: 5 mmol/L — ABNORMAL HIGH (ref 0.0–2.0)
Bicarbonate: 24.7 mmol/L (ref 20.0–28.0)
Bicarbonate: 25.3 mmol/L (ref 20.0–28.0)
Bicarbonate: 26.9 mmol/L (ref 20.0–28.0)
Bicarbonate: 24 mmol/L (ref 20.0–28.0)
Bicarbonate: 22.8 mmol/L (ref 20.0–28.0)
Bicarbonate: 21.1 mmol/L (ref 20.0–28.0)
Bicarbonate: 19.1 mmol/L — ABNORMAL LOW (ref 20.0–28.0)
Calcium, Ion: 1.22 mmol/L (ref 1.15–1.40)
Calcium, Ion: 0.99 mmol/L — ABNORMAL LOW (ref 1.15–1.40)
Calcium, Ion: 1.05 mmol/L — ABNORMAL LOW (ref 1.15–1.40)
Calcium, Ion: 1.01 mmol/L — ABNORMAL LOW (ref 1.15–1.40)
Calcium, Ion: 0.98 mmol/L — ABNORMAL LOW (ref 1.15–1.40)
Calcium, Ion: 0.98 mmol/L — ABNORMAL LOW (ref 1.15–1.40)
Calcium, Ion: 0.9 mmol/L — ABNORMAL LOW (ref 1.15–1.40)
HCT: 37 % — ABNORMAL LOW (ref 39.0–52.0)
HCT: 28 % — ABNORMAL LOW (ref 39.0–52.0)
HCT: 26 % — ABNORMAL LOW (ref 39.0–52.0)
HCT: 26 % — ABNORMAL LOW (ref 39.0–52.0)
HCT: 27 % — ABNORMAL LOW (ref 39.0–52.0)
HCT: 34 % — ABNORMAL LOW (ref 39.0–52.0)
HCT: 22 % — ABNORMAL LOW (ref 39.0–52.0)
Hemoglobin: 12.6 g/dL — ABNORMAL LOW (ref 13.0–17.0)
Hemoglobin: 9.5 g/dL — ABNORMAL LOW (ref 13.0–17.0)
Hemoglobin: 8.8 g/dL — ABNORMAL LOW (ref 13.0–17.0)
Hemoglobin: 8.8 g/dL — ABNORMAL LOW (ref 13.0–17.0)
Hemoglobin: 9.2 g/dL — ABNORMAL LOW (ref 13.0–17.0)
Hemoglobin: 11.6 g/dL — ABNORMAL LOW (ref 13.0–17.0)
Hemoglobin: 7.5 g/dL — ABNORMAL LOW (ref 13.0–17.0)
O2 Saturation: 100 %
O2 Saturation: 100 %
O2 Saturation: 100 %
O2 Saturation: 100 %
O2 Saturation: 99 %
O2 Saturation: 94 %
O2 Saturation: 99 %
Patient temperature: 35.9
Patient temperature: 37.2
Potassium: 3.9 mmol/L (ref 3.5–5.1)
Potassium: 4.5 mmol/L (ref 3.5–5.1)
Potassium: 4.8 mmol/L (ref 3.5–5.1)
Potassium: 5.2 mmol/L — ABNORMAL HIGH (ref 3.5–5.1)
Potassium: 4.5 mmol/L (ref 3.5–5.1)
Potassium: 4.6 mmol/L (ref 3.5–5.1)
Potassium: 3.5 mmol/L (ref 3.5–5.1)
Sodium: 142 mmol/L (ref 135–145)
Sodium: 140 mmol/L (ref 135–145)
Sodium: 139 mmol/L (ref 135–145)
Sodium: 139 mmol/L (ref 135–145)
Sodium: 140 mmol/L (ref 135–145)
Sodium: 139 mmol/L (ref 135–145)
Sodium: 141 mmol/L (ref 135–145)
TCO2: 26 mmol/L (ref 22–32)
TCO2: 27 mmol/L (ref 22–32)
TCO2: 28 mmol/L (ref 22–32)
TCO2: 25 mmol/L (ref 22–32)
TCO2: 24 mmol/L (ref 22–32)
TCO2: 23 mmol/L (ref 22–32)
TCO2: 20 mmol/L — ABNORMAL LOW (ref 22–32)
pCO2 arterial: 45.5 mmHg (ref 32.0–48.0)
pCO2 arterial: 43.6 mmHg (ref 32.0–48.0)
pCO2 arterial: 47.5 mmHg (ref 32.0–48.0)
pCO2 arterial: 37.3 mmHg (ref 32.0–48.0)
pCO2 arterial: 40.6 mmHg (ref 32.0–48.0)
pCO2 arterial: 47.6 mmHg (ref 32.0–48.0)
pCO2 arterial: 33.2 mmHg (ref 32.0–48.0)
pH, Arterial: 7.342 — ABNORMAL LOW (ref 7.350–7.450)
pH, Arterial: 7.372 (ref 7.350–7.450)
pH, Arterial: 7.362 (ref 7.350–7.450)
pH, Arterial: 7.416 (ref 7.350–7.450)
pH, Arterial: 7.356 (ref 7.350–7.450)
pH, Arterial: 7.25 — ABNORMAL LOW (ref 7.350–7.450)
pH, Arterial: 7.371 (ref 7.350–7.450)
pO2, Arterial: 363 mmHg — ABNORMAL HIGH (ref 83.0–108.0)
pO2, Arterial: 416 mmHg — ABNORMAL HIGH (ref 83.0–108.0)
pO2, Arterial: 365 mmHg — ABNORMAL HIGH (ref 83.0–108.0)
pO2, Arterial: 290 mmHg — ABNORMAL HIGH (ref 83.0–108.0)
pO2, Arterial: 150 mmHg — ABNORMAL HIGH (ref 83.0–108.0)
pO2, Arterial: 77 mmHg — ABNORMAL LOW (ref 83.0–108.0)
pO2, Arterial: 151 mmHg — ABNORMAL HIGH (ref 83.0–108.0)

## 2020-09-22 LAB — POCT I-STAT, CHEM 8
BUN: 11 mg/dL (ref 8–23)
BUN: 10 mg/dL (ref 8–23)
BUN: 10 mg/dL (ref 8–23)
BUN: 8 mg/dL (ref 8–23)
BUN: 8 mg/dL (ref 8–23)
Calcium, Ion: 1.25 mmol/L (ref 1.15–1.40)
Calcium, Ion: 1.18 mmol/L (ref 1.15–1.40)
Calcium, Ion: 1.08 mmol/L — ABNORMAL LOW (ref 1.15–1.40)
Calcium, Ion: 0.97 mmol/L — ABNORMAL LOW (ref 1.15–1.40)
Calcium, Ion: 0.99 mmol/L — ABNORMAL LOW (ref 1.15–1.40)
Chloride: 105 mmol/L (ref 98–111)
Chloride: 106 mmol/L (ref 98–111)
Chloride: 104 mmol/L (ref 98–111)
Chloride: 103 mmol/L (ref 98–111)
Chloride: 103 mmol/L (ref 98–111)
Creatinine, Ser: 0.8 mg/dL (ref 0.61–1.24)
Creatinine, Ser: 0.6 mg/dL — ABNORMAL LOW (ref 0.61–1.24)
Creatinine, Ser: 0.6 mg/dL — ABNORMAL LOW (ref 0.61–1.24)
Creatinine, Ser: 0.5 mg/dL — ABNORMAL LOW (ref 0.61–1.24)
Creatinine, Ser: 0.6 mg/dL — ABNORMAL LOW (ref 0.61–1.24)
Glucose, Bld: 116 mg/dL — ABNORMAL HIGH (ref 70–99)
Glucose, Bld: 123 mg/dL — ABNORMAL HIGH (ref 70–99)
Glucose, Bld: 126 mg/dL — ABNORMAL HIGH (ref 70–99)
Glucose, Bld: 118 mg/dL — ABNORMAL HIGH (ref 70–99)
Glucose, Bld: 137 mg/dL — ABNORMAL HIGH (ref 70–99)
HCT: 35 % — ABNORMAL LOW (ref 39.0–52.0)
HCT: 32 % — ABNORMAL LOW (ref 39.0–52.0)
HCT: 26 % — ABNORMAL LOW (ref 39.0–52.0)
HCT: 22 % — ABNORMAL LOW (ref 39.0–52.0)
HCT: 24 % — ABNORMAL LOW (ref 39.0–52.0)
Hemoglobin: 11.9 g/dL — ABNORMAL LOW (ref 13.0–17.0)
Hemoglobin: 10.9 g/dL — ABNORMAL LOW (ref 13.0–17.0)
Hemoglobin: 8.8 g/dL — ABNORMAL LOW (ref 13.0–17.0)
Hemoglobin: 7.5 g/dL — ABNORMAL LOW (ref 13.0–17.0)
Hemoglobin: 8.2 g/dL — ABNORMAL LOW (ref 13.0–17.0)
Potassium: 3.9 mmol/L (ref 3.5–5.1)
Potassium: 4.7 mmol/L (ref 3.5–5.1)
Potassium: 5.1 mmol/L (ref 3.5–5.1)
Potassium: 4.8 mmol/L (ref 3.5–5.1)
Potassium: 4.5 mmol/L (ref 3.5–5.1)
Sodium: 141 mmol/L (ref 135–145)
Sodium: 139 mmol/L (ref 135–145)
Sodium: 137 mmol/L (ref 135–145)
Sodium: 138 mmol/L (ref 135–145)
Sodium: 139 mmol/L (ref 135–145)
TCO2: 26 mmol/L (ref 22–32)
TCO2: 23 mmol/L (ref 22–32)
TCO2: 26 mmol/L (ref 22–32)
TCO2: 24 mmol/L (ref 22–32)
TCO2: 22 mmol/L (ref 22–32)

## 2020-09-22 LAB — BASIC METABOLIC PANEL WITH GFR
Anion gap: 6 (ref 5–15)
BUN: 9 mg/dL (ref 8–23)
CO2: 21 mmol/L — ABNORMAL LOW (ref 22–32)
Calcium: 6.1 mg/dL — CL (ref 8.9–10.3)
Chloride: 104 mmol/L (ref 98–111)
Creatinine, Ser: 0.81 mg/dL (ref 0.61–1.24)
GFR, Estimated: >60 mL/min
Glucose, Bld: 165 mg/dL — ABNORMAL HIGH (ref 70–99)
Potassium: 3.9 mmol/L (ref 3.5–5.1)
Sodium: 131 mmol/L — ABNORMAL LOW (ref 135–145)

## 2020-09-22 LAB — CBC
HCT: 33.5 % — ABNORMAL LOW (ref 39.0–52.0)
HCT: 37.4 % — ABNORMAL LOW (ref 39.0–52.0)
HCT: 28.1 % — ABNORMAL LOW (ref 39.0–52.0)
Hemoglobin: 11.9 g/dL — ABNORMAL LOW (ref 13.0–17.0)
Hemoglobin: 12.8 g/dL — ABNORMAL LOW (ref 13.0–17.0)
Hemoglobin: 9.9 g/dL — ABNORMAL LOW (ref 13.0–17.0)
MCH: 33.1 pg (ref 26.0–34.0)
MCH: 32.2 pg (ref 26.0–34.0)
MCH: 32.2 pg (ref 26.0–34.0)
MCHC: 35.5 g/dL (ref 30.0–36.0)
MCHC: 34.2 g/dL (ref 30.0–36.0)
MCHC: 35.2 g/dL (ref 30.0–36.0)
MCV: 93.3 fL (ref 80.0–100.0)
MCV: 94 fL (ref 80.0–100.0)
MCV: 91.5 fL (ref 80.0–100.0)
Platelets: 74 10*3/uL — ABNORMAL LOW (ref 150–400)
Platelets: 70 10*3/uL — ABNORMAL LOW (ref 150–400)
Platelets: 86 K/uL — ABNORMAL LOW (ref 150–400)
RBC: 3.59 MIL/uL — ABNORMAL LOW (ref 4.22–5.81)
RBC: 3.98 MIL/uL — ABNORMAL LOW (ref 4.22–5.81)
RBC: 3.07 MIL/uL — ABNORMAL LOW (ref 4.22–5.81)
RDW: 12.1 % (ref 11.5–15.5)
RDW: 12.4 % (ref 11.5–15.5)
RDW: 12.7 % (ref 11.5–15.5)
WBC: 9.3 10*3/uL (ref 4.0–10.5)
WBC: 9.5 10*3/uL (ref 4.0–10.5)
WBC: 5.8 K/uL (ref 4.0–10.5)
nRBC: 0 % (ref 0.0–0.2)
nRBC: 0 % (ref 0.0–0.2)
nRBC: 0 % (ref 0.0–0.2)

## 2020-09-22 LAB — GLUCOSE, CAPILLARY
Glucose-Capillary: 119 mg/dL — ABNORMAL HIGH (ref 70–99)
Glucose-Capillary: 128 mg/dL — ABNORMAL HIGH (ref 70–99)
Glucose-Capillary: 131 mg/dL — ABNORMAL HIGH (ref 70–99)
Glucose-Capillary: 188 mg/dL — ABNORMAL HIGH (ref 70–99)
Glucose-Capillary: 133 mg/dL — ABNORMAL HIGH (ref 70–99)

## 2020-09-22 LAB — PLATELET COUNT: Platelets: 79 10*3/uL — ABNORMAL LOW (ref 150–400)

## 2020-09-22 LAB — APTT: aPTT: 34 seconds (ref 24–36)

## 2020-09-22 LAB — PROTIME-INR
INR: 1.7 — ABNORMAL HIGH (ref 0.8–1.2)
Prothrombin Time: 20 seconds — ABNORMAL HIGH (ref 11.4–15.2)

## 2020-09-22 LAB — HEMOGLOBIN AND HEMATOCRIT, BLOOD
HCT: 25.7 % — ABNORMAL LOW (ref 39.0–52.0)
Hemoglobin: 9.1 g/dL — ABNORMAL LOW (ref 13.0–17.0)

## 2020-09-22 LAB — ABO/RH: ABO/RH(D): A POS

## 2020-09-22 LAB — MAGNESIUM: Magnesium: 2.6 mg/dL — ABNORMAL HIGH (ref 1.7–2.4)

## 2020-09-22 LAB — PREPARE RBC (CROSSMATCH)

## 2020-09-22 SURGERY — REPAIR, MITRAL VALVE, MINIMALLY INVASIVE
Anesthesia: General | Site: Chest | Laterality: Right

## 2020-09-22 MED ORDER — PHENYLEPHRINE HCL-NACL 20-0.9 MG/250ML-% IV SOLN
0.0000 ug/min | INTRAVENOUS | Status: DC
  Administered 2020-09-23: 60 ug/min via INTRAVENOUS
  Administered 2020-09-23: 50 ug/min via INTRAVENOUS
  Administered 2020-09-24: 25 ug/min via INTRAVENOUS
  Filled 2020-09-22 (×3): qty 250

## 2020-09-22 MED ORDER — ROSUVASTATIN CALCIUM 20 MG PO TABS
40.0000 mg | ORAL_TABLET | Freq: Every day | ORAL | Status: DC
  Administered 2020-09-22: 40 mg via ORAL
  Filled 2020-09-22: qty 2

## 2020-09-22 MED ORDER — MIDAZOLAM HCL (PF) 10 MG/2ML IJ SOLN
INTRAMUSCULAR | Status: AC
  Filled 2020-09-22: qty 2

## 2020-09-22 MED ORDER — CEFAZOLIN SODIUM-DEXTROSE 2-4 GM/100ML-% IV SOLN
2.0000 g | Freq: Three times a day (TID) | INTRAVENOUS | Status: AC
  Administered 2020-09-22 – 2020-09-24 (×6): 2 g via INTRAVENOUS
  Filled 2020-09-22 (×7): qty 100

## 2020-09-22 MED ORDER — 0.9 % SODIUM CHLORIDE (POUR BTL) OPTIME
TOPICAL | Status: DC | PRN
Start: 2020-09-22 — End: 2020-09-22
  Administered 2020-09-22: 4000 mL

## 2020-09-22 MED ORDER — MIDAZOLAM HCL 2 MG/2ML IJ SOLN: 2.0000 mg | INTRAMUSCULAR | Status: DC | PRN

## 2020-09-22 MED ORDER — SODIUM CHLORIDE 0.9 % IV SOLN: INTRAVENOUS | Status: DC

## 2020-09-22 MED ORDER — PROPOFOL 10 MG/ML IV BOLUS
INTRAVENOUS | Status: AC
  Filled 2020-09-22: qty 20

## 2020-09-22 MED ORDER — LACTATED RINGERS IV SOLN: INTRAVENOUS | Status: DC

## 2020-09-22 MED ORDER — CHLORHEXIDINE GLUCONATE 4 % EX LIQD: 30.0000 mL | CUTANEOUS | Status: DC

## 2020-09-22 MED ORDER — FENTANYL CITRATE (PF) 250 MCG/5ML IJ SOLN
INTRAMUSCULAR | Status: DC | PRN
  Administered 2020-09-22: 50 ug via INTRAVENOUS
  Administered 2020-09-22 (×2): 25 ug via INTRAVENOUS
  Administered 2020-09-22: 100 ug via INTRAVENOUS
  Administered 2020-09-22: 50 ug via INTRAVENOUS
  Administered 2020-09-22: 100 ug via INTRAVENOUS
  Administered 2020-09-22: 50 ug via INTRAVENOUS

## 2020-09-22 MED ORDER — PROTAMINE SULFATE 10 MG/ML IV SOLN
INTRAVENOUS | Status: DC | PRN
  Administered 2020-09-22: 30 mg via INTRAVENOUS
  Administered 2020-09-22: 90 mg via INTRAVENOUS
  Administered 2020-09-22 (×2): 70 mg via INTRAVENOUS
  Administered 2020-09-22: 30 mg via INTRAVENOUS
  Administered 2020-09-22: 70 mg via INTRAVENOUS

## 2020-09-22 MED ORDER — ALBUMIN HUMAN 5 % IV SOLN
250.0000 mL | INTRAVENOUS | Status: AC | PRN
  Administered 2020-09-22 – 2020-09-23 (×4): 12.5 g via INTRAVENOUS
  Filled 2020-09-22 (×2): qty 250

## 2020-09-22 MED ORDER — PANTOPRAZOLE SODIUM 40 MG PO TBEC
40.0000 mg | DELAYED_RELEASE_TABLET | Freq: Every day | ORAL | Status: DC
  Administered 2020-09-23 – 2020-09-29 (×7): 40 mg via ORAL
  Filled 2020-09-22 (×7): qty 1

## 2020-09-22 MED ORDER — ALBUMIN HUMAN 5 % IV SOLN: INTRAVENOUS | Status: DC | PRN

## 2020-09-22 MED ORDER — POTASSIUM CHLORIDE 10 MEQ/50ML IV SOLN: 10.0000 meq | INTRAVENOUS | Status: AC

## 2020-09-22 MED ORDER — BUPIVACAINE HCL (PF) 0.5 % IJ SOLN
INTRAMUSCULAR | Status: AC
  Filled 2020-09-22: qty 30

## 2020-09-22 MED ORDER — METOPROLOL TARTRATE 12.5 MG HALF TABLET: 12.5000 mg | ORAL_TABLET | Freq: Once | ORAL | Status: DC

## 2020-09-22 MED ORDER — ACETAMINOPHEN 500 MG PO TABS
1000.0000 mg | ORAL_TABLET | Freq: Four times a day (QID) | ORAL | Status: AC
  Administered 2020-09-23 – 2020-09-27 (×15): 1000 mg via ORAL
  Filled 2020-09-22 (×15): qty 2

## 2020-09-22 MED ORDER — PAPAVERINE HCL 30 MG/ML IJ SOLN
INTRAMUSCULAR | Status: DC | PRN
  Administered 2020-09-22: 500 mL via INTRAVASCULAR

## 2020-09-22 MED ORDER — ACETAMINOPHEN 160 MG/5ML PO SOLN: 650.0000 mg | Freq: Once | ORAL | Status: AC

## 2020-09-22 MED ORDER — LACTATED RINGERS IV SOLN: INTRAVENOUS | Status: DC | PRN

## 2020-09-22 MED ORDER — INSULIN REGULAR(HUMAN) IN NACL 100-0.9 UT/100ML-% IV SOLN: INTRAVENOUS | Status: DC

## 2020-09-22 MED ORDER — EZETIMIBE 10 MG PO TABS
10.0000 mg | ORAL_TABLET | Freq: Every day | ORAL | Status: DC
  Administered 2020-09-22: 10 mg via ORAL
  Filled 2020-09-22: qty 1

## 2020-09-22 MED ORDER — BUPIVACAINE LIPOSOME 1.3 % IJ SUSP
INTRAMUSCULAR | Status: AC
  Filled 2020-09-22: qty 20

## 2020-09-22 MED ORDER — FLUTICASONE PROPIONATE 50 MCG/ACT NA SUSP
1.0000 | Freq: Every day | NASAL | Status: DC
  Administered 2020-09-22 – 2020-09-28 (×6): 1 via NASAL
  Filled 2020-09-22: qty 16

## 2020-09-22 MED ORDER — SODIUM CHLORIDE 0.9 % IV SOLN: 250.0000 mL | INTRAVENOUS | Status: DC

## 2020-09-22 MED ORDER — DOCUSATE SODIUM 100 MG PO CAPS
200.0000 mg | ORAL_CAPSULE | Freq: Every day | ORAL | Status: DC
  Administered 2020-09-23 – 2020-09-27 (×5): 200 mg via ORAL
  Filled 2020-09-22 (×6): qty 2

## 2020-09-22 MED ORDER — SODIUM CHLORIDE 0.9% IV SOLUTION: Freq: Once | INTRAVENOUS | Status: AC

## 2020-09-22 MED ORDER — BUPIVACAINE LIPOSOME 1.3 % IJ SUSP
INTRAMUSCULAR | Status: DC | PRN
  Administered 2020-09-22: 50 mL

## 2020-09-22 MED ORDER — SALINE SPRAY 0.65 % NA SOLN
1.0000 | NASAL | Status: DC | PRN
  Administered 2020-09-26: 1 via NASAL
  Filled 2020-09-22: qty 44

## 2020-09-22 MED ORDER — CHLORHEXIDINE GLUCONATE 0.12 % MT SOLN
15.0000 mL | OROMUCOSAL | Status: AC
  Administered 2020-09-22: 15 mL via OROMUCOSAL

## 2020-09-22 MED ORDER — SODIUM CHLORIDE 0.9 % IV SOLN
1.0000 g | Freq: Once | INTRAVENOUS | Status: AC
  Administered 2020-09-23: 1 g via INTRAVENOUS
  Filled 2020-09-22: qty 10

## 2020-09-22 MED ORDER — SUGAMMADEX SODIUM 500 MG/5ML IV SOLN
INTRAVENOUS | Status: DC | PRN
  Administered 2020-09-22: 500 mg via INTRAVENOUS

## 2020-09-22 MED ORDER — NITROGLYCERIN 0.2 MG/ML ON CALL CATH LAB
INTRAVENOUS | Status: DC | PRN
  Administered 2020-09-22 (×3): 20 ug via INTRAVENOUS

## 2020-09-22 MED ORDER — ASPIRIN EC 325 MG PO TBEC: 325.0000 mg | DELAYED_RELEASE_TABLET | Freq: Every day | ORAL | Status: DC

## 2020-09-22 MED ORDER — OXYCODONE HCL 5 MG PO TABS
5.0000 mg | ORAL_TABLET | ORAL | Status: DC | PRN
  Administered 2020-09-22 – 2020-09-23 (×4): 10 mg via ORAL
  Administered 2020-09-25: 5 mg via ORAL
  Administered 2020-09-29 (×2): 10 mg via ORAL
  Filled 2020-09-22 (×6): qty 2
  Filled 2020-09-22: qty 1

## 2020-09-22 MED ORDER — DEXTROSE 50 % IV SOLN: 0.0000 mL | INTRAVENOUS | Status: DC | PRN

## 2020-09-22 MED ORDER — BISACODYL 10 MG RE SUPP: 10.0000 mg | Freq: Every day | RECTAL | Status: DC

## 2020-09-22 MED ORDER — DEXMEDETOMIDINE HCL IN NACL 400 MCG/100ML IV SOLN
0.0000 ug/kg/h | INTRAVENOUS | Status: DC
  Administered 2020-09-22: 0.2 ug/kg/h via INTRAVENOUS
  Filled 2020-09-22: qty 100

## 2020-09-22 MED ORDER — SODIUM CHLORIDE 0.9% FLUSH: 10.0000 mL | INTRAVENOUS | Status: DC | PRN

## 2020-09-22 MED ORDER — SODIUM CHLORIDE 0.9% FLUSH: 3.0000 mL | INTRAVENOUS | Status: DC | PRN

## 2020-09-22 MED ORDER — MAGNESIUM SULFATE 4 GM/100ML IV SOLN
4.0000 g | Freq: Once | INTRAVENOUS | Status: AC
  Administered 2020-09-22: 4 g via INTRAVENOUS
  Filled 2020-09-22: qty 100

## 2020-09-22 MED ORDER — MIDAZOLAM HCL 5 MG/5ML IJ SOLN
INTRAMUSCULAR | Status: DC | PRN
  Administered 2020-09-22: 1 mg via INTRAVENOUS
  Administered 2020-09-22: 2 mg via INTRAVENOUS
  Administered 2020-09-22: 1 mg via INTRAVENOUS

## 2020-09-22 MED ORDER — FAMOTIDINE IN NACL 20-0.9 MG/50ML-% IV SOLN
20.0000 mg | Freq: Two times a day (BID) | INTRAVENOUS | Status: DC
  Administered 2020-09-22: 20 mg via INTRAVENOUS
  Filled 2020-09-22 (×2): qty 50

## 2020-09-22 MED ORDER — PROPOFOL 10 MG/ML IV BOLUS
INTRAVENOUS | Status: DC | PRN
  Administered 2020-09-22 (×2): 20 mg via INTRAVENOUS
  Administered 2020-09-22: 100 mg via INTRAVENOUS

## 2020-09-22 MED ORDER — CHLORHEXIDINE GLUCONATE 0.12 % MT SOLN
15.0000 mL | Freq: Once | OROMUCOSAL | Status: AC
  Administered 2020-09-22: 15 mL via OROMUCOSAL

## 2020-09-22 MED ORDER — ASPIRIN 81 MG PO CHEW: 324.0000 mg | CHEWABLE_TABLET | Freq: Every day | ORAL | Status: DC

## 2020-09-22 MED ORDER — ACETAMINOPHEN 160 MG/5ML PO SOLN: 1000.0000 mg | Freq: Four times a day (QID) | ORAL | Status: DC

## 2020-09-22 MED ORDER — SODIUM CHLORIDE 0.9% FLUSH
3.0000 mL | Freq: Two times a day (BID) | INTRAVENOUS | Status: DC
  Administered 2020-09-23 – 2020-09-26 (×5): 3 mL via INTRAVENOUS

## 2020-09-22 MED ORDER — MORPHINE SULFATE (PF) 2 MG/ML IV SOLN
1.0000 mg | INTRAVENOUS | Status: DC | PRN
  Administered 2020-09-22 – 2020-09-23 (×4): 2 mg via INTRAVENOUS
  Filled 2020-09-22: qty 1
  Filled 2020-09-22: qty 2
  Filled 2020-09-22: qty 1

## 2020-09-22 MED ORDER — LEVOTHYROXINE SODIUM 75 MCG PO TABS
75.0000 ug | ORAL_TABLET | Freq: Every day | ORAL | Status: DC
  Filled 2020-09-22: qty 1

## 2020-09-22 MED ORDER — VANCOMYCIN HCL 1000 MG IV SOLR
INTRAVENOUS | Status: DC | PRN
  Administered 2020-09-22: 1000 mL

## 2020-09-22 MED ORDER — ACETAMINOPHEN 650 MG RE SUPP
650.0000 mg | Freq: Once | RECTAL | Status: AC
  Administered 2020-09-22: 650 mg via RECTAL

## 2020-09-22 MED ORDER — ONDANSETRON HCL 4 MG/2ML IJ SOLN
4.0000 mg | Freq: Four times a day (QID) | INTRAMUSCULAR | Status: DC | PRN
  Administered 2020-09-24: 4 mg via INTRAVENOUS
  Filled 2020-09-22: qty 2

## 2020-09-22 MED ORDER — HEPARIN SODIUM (PORCINE) 1000 UNIT/ML IJ SOLN
INTRAMUSCULAR | Status: DC | PRN
  Administered 2020-09-22: 36000 [IU] via INTRAVENOUS

## 2020-09-22 MED ORDER — ~~LOC~~ CARDIAC SURGERY, PATIENT & FAMILY EDUCATION
Freq: Once | Status: DC
Start: 2020-09-22 — End: 2020-09-22
  Filled 2020-09-22: qty 1

## 2020-09-22 MED ORDER — VANCOMYCIN HCL IN DEXTROSE 1-5 GM/200ML-% IV SOLN
1000.0000 mg | Freq: Once | INTRAVENOUS | Status: AC
  Administered 2020-09-22: 1000 mg via INTRAVENOUS
  Filled 2020-09-22: qty 200

## 2020-09-22 MED ORDER — TRAMADOL HCL 50 MG PO TABS
50.0000 mg | ORAL_TABLET | ORAL | Status: DC | PRN
  Administered 2020-09-23: 100 mg via ORAL
  Administered 2020-09-25: 50 mg via ORAL
  Administered 2020-09-25: 100 mg via ORAL
  Administered 2020-09-26 (×2): 50 mg via ORAL
  Administered 2020-09-28 – 2020-09-29 (×4): 100 mg via ORAL
  Filled 2020-09-22 (×2): qty 2
  Filled 2020-09-22: qty 1
  Filled 2020-09-22 (×3): qty 2
  Filled 2020-09-22: qty 1
  Filled 2020-09-22 (×2): qty 2

## 2020-09-22 MED ORDER — CHLORHEXIDINE GLUCONATE CLOTH 2 % EX PADS
6.0000 | MEDICATED_PAD | Freq: Every day | CUTANEOUS | Status: DC
  Administered 2020-09-22 – 2020-09-26 (×5): 6 via TOPICAL

## 2020-09-22 MED ORDER — ROCURONIUM BROMIDE 100 MG/10ML IV SOLN
INTRAVENOUS | Status: DC | PRN
  Administered 2020-09-22: 80 mg via INTRAVENOUS

## 2020-09-22 MED ORDER — LACTATED RINGERS IV SOLN
500.0000 mL | Freq: Once | INTRAVENOUS | Status: AC | PRN
  Administered 2020-09-23: 500 mL via INTRAVENOUS

## 2020-09-22 MED ORDER — ORAL CARE MOUTH RINSE
15.0000 mL | Freq: Two times a day (BID) | OROMUCOSAL | Status: DC
  Administered 2020-09-22 – 2020-09-29 (×13): 15 mL via OROMUCOSAL

## 2020-09-22 MED ORDER — SODIUM CHLORIDE 0.45 % IV SOLN: INTRAVENOUS | Status: DC | PRN

## 2020-09-22 MED ORDER — BISACODYL 5 MG PO TBEC
10.0000 mg | DELAYED_RELEASE_TABLET | Freq: Every day | ORAL | Status: DC
  Administered 2020-09-23 – 2020-09-27 (×5): 10 mg via ORAL
  Filled 2020-09-22 (×6): qty 2

## 2020-09-22 MED ORDER — NITROGLYCERIN IN D5W 200-5 MCG/ML-% IV SOLN: 0.0000 ug/min | INTRAVENOUS | Status: DC

## 2020-09-22 MED ORDER — SODIUM CHLORIDE 0.9% FLUSH
10.0000 mL | Freq: Two times a day (BID) | INTRAVENOUS | Status: DC
Start: 2020-09-22 — End: 2020-09-26
  Administered 2020-09-22 – 2020-09-24 (×3): 10 mL
  Administered 2020-09-24: 20 mL
  Administered 2020-09-25: 10 mL

## 2020-09-22 MED ORDER — FENTANYL CITRATE (PF) 250 MCG/5ML IJ SOLN
INTRAMUSCULAR | Status: AC
  Filled 2020-09-22: qty 15

## 2020-09-22 MED ORDER — PHENYLEPHRINE 40 MCG/ML (10ML) SYRINGE FOR IV PUSH (FOR BLOOD PRESSURE SUPPORT)
PREFILLED_SYRINGE | INTRAVENOUS | Status: DC | PRN
  Administered 2020-09-22 (×2): 80 ug via INTRAVENOUS
  Administered 2020-09-22: 120 ug via INTRAVENOUS
  Administered 2020-09-22: 80 ug via INTRAVENOUS

## 2020-09-22 SURGICAL SUPPLY — 134 items
ADAPTER CARDIO PERF ANTE/RETRO (ADAPTER) ×3 IMPLANT
ADH SKN CLS APL DERMABOND .7 (GAUZE/BANDAGES/DRESSINGS) ×2
ADPR PRFSN 84XANTGRD RTRGD (ADAPTER) ×2
BAG DECANTER FOR FLEXI CONT (MISCELLANEOUS) ×6 IMPLANT
BLADE CLIPPER SURG (BLADE) ×3 IMPLANT
BLADE SURG 11 STRL SS (BLADE) ×3 IMPLANT
CABLE PACING FASLOC BLUE (MISCELLANEOUS) ×3 IMPLANT
CANISTER SUCT 3000ML PPV (MISCELLANEOUS) ×6 IMPLANT
CANNULA ADULT BIO-MEDICUS 15FR (CANNULA) ×3 IMPLANT
CANNULA FEM BIOMEDICUS 25FR (CANNULA) ×3 IMPLANT
CANNULA FEMORAL ART 14 SM (MISCELLANEOUS) ×3 IMPLANT
CANNULA GUNDRY RCSP 15FR (MISCELLANEOUS) ×3 IMPLANT
CANNULA OPTISITE PERFUSION 18F (CANNULA) ×3 IMPLANT
CANNULA SUMP PERICARDIAL (CANNULA) ×6 IMPLANT
CATH CPB KIT OWEN (MISCELLANEOUS) IMPLANT
CELLS DAT CNTRL 66122 CELL SVR (MISCELLANEOUS) ×2 IMPLANT
CLOSURE PERCLOSE PROSTYLE (VASCULAR PRODUCTS) ×12 IMPLANT
CNTNR URN SCR LID CUP LEK RST (MISCELLANEOUS) ×2 IMPLANT
CONN ST 1/4X3/8  BEN (MISCELLANEOUS) ×2
CONN ST 1/4X3/8 BEN (MISCELLANEOUS) ×4 IMPLANT
CONNECTOR 1/2X3/8X1/2 3 WAY (MISCELLANEOUS) ×1
CONNECTOR 1/2X3/8X1/2 3WAY (MISCELLANEOUS) ×2 IMPLANT
CONT SPEC 4OZ STRL OR WHT (MISCELLANEOUS) ×3
CONTAINER PROTECT SURGISLUSH (MISCELLANEOUS) ×3 IMPLANT
COVER BACK TABLE 24X17X13 BIG (DRAPES) ×3 IMPLANT
COVER PROBE W GEL 5X96 (DRAPES) ×3 IMPLANT
DERMABOND ADVANCED (GAUZE/BANDAGES/DRESSINGS) ×1
DERMABOND ADVANCED .7 DNX12 (GAUZE/BANDAGES/DRESSINGS) ×2 IMPLANT
DEVICE SUT CK QUICK LOAD MINI (Prosthesis & Implant Heart) ×6 IMPLANT
DEVICE TROCAR PUNCTURE CLOSURE (ENDOMECHANICALS) ×3 IMPLANT
DRAIN CHANNEL 32F RND 10.7 FF (WOUND CARE) ×6 IMPLANT
DRAPE C-ARM 42X72 X-RAY (DRAPES) ×3 IMPLANT
DRAPE CV SPLIT W-CLR ANES SCRN (DRAPES) ×3 IMPLANT
DRAPE INCISE IOBAN 66X45 STRL (DRAPES) ×12 IMPLANT
DRAPE PERI GROIN 82X75IN TIB (DRAPES) ×3 IMPLANT
DRAPE WARM FLUID 44X44 (DRAPES) ×3 IMPLANT
DRESSING AQUACEL AG SP 3.5X10 (GAUZE/BANDAGES/DRESSINGS) ×2 IMPLANT
DRSG AQUACEL AG ADV 3.5X10 (GAUZE/BANDAGES/DRESSINGS) ×3 IMPLANT
DRSG AQUACEL AG SP 3.5X10 (GAUZE/BANDAGES/DRESSINGS) ×3
ELECT BLADE 6.5 EXT (BLADE) ×3 IMPLANT
ELECT REM PT RETURN 9FT ADLT (ELECTROSURGICAL) ×6
ELECTRODE REM PT RTRN 9FT ADLT (ELECTROSURGICAL) ×4 IMPLANT
FELT TEFLON 1X6 (MISCELLANEOUS) ×3 IMPLANT
FEMORAL VENOUS CANN RAP (CANNULA) IMPLANT
GAUZE 4X4 16PLY RFD (DISPOSABLE) ×3 IMPLANT
GAUZE SPONGE 4X4 12PLY STRL LF (GAUZE/BANDAGES/DRESSINGS) ×3 IMPLANT
GLOVE ORTHO TXT STRL SZ7.5 (GLOVE) ×9 IMPLANT
GLOVE SURG MICRO LTX SZ6 (GLOVE) ×3 IMPLANT
GLOVE SURG MICRO LTX SZ6.5 (GLOVE) ×18 IMPLANT
GOWN STRL REUS W/ TWL LRG LVL3 (GOWN DISPOSABLE) ×20 IMPLANT
GOWN STRL REUS W/TWL LRG LVL3 (GOWN DISPOSABLE) ×30
GRASPER SUT TROCAR 14GX15 (MISCELLANEOUS) ×3 IMPLANT
IV NS 1000ML (IV SOLUTION) ×9
IV NS 1000ML BAXH (IV SOLUTION) ×6 IMPLANT
IV NS IRRIG 3000ML ARTHROMATIC (IV SOLUTION) ×3 IMPLANT
KIT BASIN OR (CUSTOM PROCEDURE TRAY) ×3 IMPLANT
KIT DEVICE SUT COR-KNOT MIS 5 (INSTRUMENTS) ×3 IMPLANT
KIT DILATOR VASC 18G NDL (KITS) ×3 IMPLANT
KIT DRAINAGE VACCUM ASSIST (KITS) ×3 IMPLANT
KIT MICROPUNCTURE NIT STIFF (SHEATH) ×3 IMPLANT
KIT SUCTION CATH 14FR (SUCTIONS) ×3 IMPLANT
KIT SUT CK MINI COMBO 4X17 (Prosthesis & Implant Heart) ×3 IMPLANT
KIT TURNOVER KIT B (KITS) ×3 IMPLANT
LEAD PACING MYOCARDI (MISCELLANEOUS) ×3 IMPLANT
LINE VENT (MISCELLANEOUS) ×3 IMPLANT
NEEDLE AORTIC ROOT 14G 7F (CATHETERS) ×3 IMPLANT
NS IRRIG 1000ML POUR BTL (IV SOLUTION) ×15 IMPLANT
PACK E MIN INVASIVE VALVE (SUTURE) IMPLANT
PACK OPEN HEART (CUSTOM PROCEDURE TRAY) ×3 IMPLANT
PAD ARMBOARD 7.5X6 YLW CONV (MISCELLANEOUS) ×6 IMPLANT
PAD ELECT DEFIB RADIOL ZOLL (MISCELLANEOUS) ×3 IMPLANT
POSITIONER HEAD DONUT 9IN (MISCELLANEOUS) ×3 IMPLANT
RING ANLPLS SIMUFORM 28 (Prosthesis & Implant Heart) ×2 IMPLANT
RING ANNULOPLASTY SIMUFORM 28 (Prosthesis & Implant Heart) ×3 IMPLANT
RTRCTR WOUND ALEXIS 18CM MED (MISCELLANEOUS) ×3
SET CANNULATION TOURNIQUET (MISCELLANEOUS) ×3 IMPLANT
SET CARDIOPLEGIA MPS 5001102 (MISCELLANEOUS) ×3 IMPLANT
SET IRRIG TUBING LAPAROSCOPIC (IRRIGATION / IRRIGATOR) ×6 IMPLANT
SET MICROPUNCTURE 5F STIFF (MISCELLANEOUS) ×3 IMPLANT
SHEATH PINNACLE 8F 10CM (SHEATH) ×9 IMPLANT
SIZER CHORD-X CHORDAL CXCS (SIZER) ×3 IMPLANT
SOL ANTI FOG 6CC (MISCELLANEOUS) ×2 IMPLANT
SOLUTION ANTI FOG 6CC (MISCELLANEOUS) ×1
SUT BONE WAX W31G (SUTURE) ×3 IMPLANT
SUT ETHIBOND (SUTURE) ×6 IMPLANT
SUT ETHIBOND 2 0 SH (SUTURE) ×3 IMPLANT
SUT ETHIBOND 2-0 RB-1 WHT (SUTURE) ×6 IMPLANT
SUT ETHIBOND NAB MH 2-0 36IN (SUTURE) ×3 IMPLANT
SUT ETHIBOND X763 2 0 SH 1 (SUTURE) ×3 IMPLANT
SUT GORETEX CV 4 TH 22 36 (SUTURE) IMPLANT
SUT GORETEX CV4 TH-18 (SUTURE) IMPLANT
SUT MNCRL AB 3-0 PS2 18 (SUTURE) ×6 IMPLANT
SUT PROLENE 3 0 SH DA (SUTURE) ×12 IMPLANT
SUT PROLENE 3 0 SH1 36 (SUTURE) ×27 IMPLANT
SUT PROLENE 4 0 RB 1 (SUTURE) ×3
SUT PROLENE 4-0 RB1 .5 CRCL 36 (SUTURE) ×2 IMPLANT
SUT PROLENE 5 0 C 1 36 (SUTURE) ×3 IMPLANT
SUT PROLENE 6 0 C 1 24 (SUTURE) ×3 IMPLANT
SUT PROLENE 6 0 C 1 30 (SUTURE) ×3 IMPLANT
SUT PTFE CHORD X 16MM (SUTURE) ×9 IMPLANT
SUT SILK  1 MH (SUTURE) ×3
SUT SILK 1 MH (SUTURE) ×6 IMPLANT
SUT SILK 1 TIES 10X30 (SUTURE) ×3 IMPLANT
SUT SILK 2 0 SH CR/8 (SUTURE) ×3 IMPLANT
SUT SILK 2 0 TIES 10X30 (SUTURE) ×3 IMPLANT
SUT SILK 2 0SH CR/8 30 (SUTURE) ×6 IMPLANT
SUT SILK 3 0 (SUTURE) ×3
SUT SILK 3 0 SH CR/8 (SUTURE) ×3 IMPLANT
SUT SILK 3 0SH CR/8 30 (SUTURE) ×3 IMPLANT
SUT SILK 3-0 18XBRD TIE 12 (SUTURE) ×2 IMPLANT
SUT TEM PAC WIRE 2 0 SH (SUTURE) ×6 IMPLANT
SUT VIC AB 1 CTX 36 (SUTURE) ×6
SUT VIC AB 1 CTX36XBRD ANBCTR (SUTURE) ×4 IMPLANT
SUT VIC AB 2 TP1 27 (SUTURE) ×3 IMPLANT
SUT VIC AB 2-0 CT1 27 (SUTURE) ×6
SUT VIC AB 2-0 CT1 TAPERPNT 27 (SUTURE) ×4 IMPLANT
SUT VIC AB 2-0 CTX 27 (SUTURE) ×6 IMPLANT
SUT VIC AB 3-0 MH 27 (SUTURE) ×3 IMPLANT
SUT VIC AB 3-0 SH 8-18 (SUTURE) ×9 IMPLANT
SYSTEM SAHARA CHEST DRAIN ATS (WOUND CARE) ×3 IMPLANT
TAPE CLOTH SURG 4X10 WHT LF (GAUZE/BANDAGES/DRESSINGS) ×3 IMPLANT
TAPE PAPER 2X10 WHT MICROPORE (GAUZE/BANDAGES/DRESSINGS) ×3 IMPLANT
TOWEL GREEN STERILE (TOWEL DISPOSABLE) ×3 IMPLANT
TOWEL GREEN STERILE FF (TOWEL DISPOSABLE) ×3 IMPLANT
TRAY FOLEY SLVR 16FR TEMP STAT (SET/KITS/TRAYS/PACK) ×3 IMPLANT
TROCAR XCEL BLADELESS 5X75MML (TROCAR) ×3 IMPLANT
TROCAR XCEL NON-BLD 11X100MML (ENDOMECHANICALS) ×6 IMPLANT
TUBE SUCT INTRACARD DLP 20F (MISCELLANEOUS) ×6 IMPLANT
TUBING ART PRESS 48 MALE/FEM (TUBING) ×3 IMPLANT
TUNNELER SHEATH ON-Q 11GX8 DSP (PAIN MANAGEMENT) IMPLANT
UNDERPAD 30X36 HEAVY ABSORB (UNDERPADS AND DIAPERS) ×3 IMPLANT
WATER STERILE IRR 1000ML POUR (IV SOLUTION) ×6 IMPLANT
WIRE EMERALD 3MM-J .035X150CM (WIRE) ×3 IMPLANT
WIRE HI TORQ VERSACORE-J 145CM (WIRE) ×3 IMPLANT

## 2020-09-22 NOTE — Anesthesia Procedure Notes (Signed)
Procedure Name: Intubation Date/Time: 09/22/2020 8:07 AM Performed by: Lynnell Chad, CRNA Pre-anesthesia Checklist: Patient identified, Emergency Drugs available, Suction available and Patient being monitored Patient Re-evaluated:Patient Re-evaluated prior to induction Oxygen Delivery Method: Circle System Utilized Preoxygenation: Pre-oxygenation with 100% oxygen Induction Type: IV induction Ventilation: Mask ventilation without difficulty Laryngoscope Size: Miller and 3 Grade View: Grade I Tube type: Oral Endobronchial tube: Left and 39 Fr Number of attempts: 1 Airway Equipment and Method: Stylet and Oral airway Placement Confirmation: ETT inserted through vocal cords under direct vision,  positive ETCO2 and breath sounds checked- equal and bilateral Tube secured with: Tape Dental Injury: Teeth and Oropharynx as per pre-operative assessment

## 2020-09-22 NOTE — Progress Notes (Signed)
TCTS Evening Rounds  DOS s/p MVr CT output slowed Extubated HD stable BP 117/71   Pulse 80   Temp (!) 97.52 F (36.4 C)   Resp 17   Ht 5\' 11"  (1.803 m)   Wt 101.6 kg   SpO2 100%   BMI 31.24 kg/m  Alert/oriented CTA RRR   Intake/Output Summary (Last 24 hours) at 09/22/2020 1948 Last data filed at 09/22/2020 1925 Gross per 24 hour  Intake 6584.4 ml  Output 3270 ml  Net 3314.4 ml    A/p: Continue early post operative care  Keyshla Tunison Z. 09/24/2020, MD [phone]

## 2020-09-22 NOTE — OR Nursing (Signed)
Dr. Cornelius Moras notified at 1505 of chest tube output at of drainage. Dr. Cornelius Moras came to OR suite and assessed  Patient and chest tube drainage, at output at 1515. Patient alert at time. Confirmed to staff that patient stable to go to 2H. Will continue to monitor on unit.

## 2020-09-22 NOTE — Brief Op Note (Signed)
09/22/2020  12:23 PM  PATIENT:  Chad Berry  80 y.o. male  PRE-OPERATIVE DIAGNOSIS:  MITRAL REGURGITATION  POST-OPERATIVE DIAGNOSIS:  MITRAL REGURGITATION  PROCEDURE:  Procedure(s):  MINIMALLY INVASIVE MITRAL VALVE REPAIR  -Ring Annuloplasty with/ MEDTRONIC SIMUFORM SEMI-RIGID   RING SIZE ON PUMP  -Placement of Neo-Chords x 10 with Chord-x System  TRANSESOPHAGEAL ECHOCARDIOGRAM (TEE) (N/A)  SURGEON:  Surgeon(s) and Role:    Purcell Nails, MD - Primary  PHYSICIAN ASSISTANT: Erin Barrett PA-C  ANESTHESIA:   general  EBL:  600 mL   BLOOD ADMINISTERED: CELLSAVER  DRAINS: Right Pleural Chest Tube   LOCAL MEDICATIONS USED:  BUPIVICAINE   SPECIMEN:  No Specimen  DISPOSITION OF SPECIMEN:  N/A  COUNTS:  YES  TOURNIQUET:  * No tourniquets in log *  DICTATION: .Dragon Dictation  PLAN OF CARE: Admit to inpatient   PATIENT DISPOSITION:  ICU - intubated and hemodynamically stable.   Delay start of Pharmacological VTE agent (>24hrs) due to surgical blood loss or risk of bleeding: yes

## 2020-09-22 NOTE — Progress Notes (Addendum)
Patient has metoprolol as an allergy as it causes bradycardia.  Spoke to Dr. Nance Pew and was told not to give metoprolol. He is also aware of BP.

## 2020-09-22 NOTE — Interval H&P Note (Signed)
History and Physical Interval Note:  09/22/2020 6:21 AM  Chad Berry  has presented today for surgery, with the diagnosis of MR.  The various methods of treatment have been discussed with the patient and family. After consideration of risks, benefits and other options for treatment, the patient has consented to  Procedure(s): MINIMALLY INVASIVE MITRAL VALVE REPAIR (MVR) (Right) TRANSESOPHAGEAL ECHOCARDIOGRAM (TEE) (N/A) as a surgical intervention.  The patient's history has been reviewed, patient examined, no change in status, stable for surgery.  I have reviewed the patient's chart and labs.  Questions were answered to the patient's satisfaction.     Purcell Nails

## 2020-09-22 NOTE — Anesthesia Procedure Notes (Signed)
Central Venous Catheter Insertion Performed by: Atilano Median, DO, anesthesiologist Start/End5/25/2022 7:00 AM, 09/22/2020 7:35 AM Patient location: Pre-op. Preanesthetic checklist: patient identified, IV checked, site marked, risks and benefits discussed, surgical consent, monitors and equipment checked, pre-op evaluation, timeout performed and anesthesia consent Position: Trendelenburg Lidocaine 1% used for infiltration and patient sedated Hand hygiene performed  and maximum sterile barriers used  Catheter size: 8.5 Fr Central line was placed.Sheath introducer Procedure performed using ultrasound guided technique. Ultrasound Notes:anatomy identified, needle tip was noted to be adjacent to the nerve/plexus identified, no ultrasound evidence of intravascular and/or intraneural injection and image(s) printed for medical record Attempts: 2 Following insertion, line sutured and dressing applied. Post procedure assessment: blood return through all ports, free fluid flow and no air  Patient tolerated the procedure well with no immediate complications. Additional procedure comments: Initial attempt at [facility] unsuccessful. Small easily collapsible vessel unable to advance wire. Discussed with Dr. Cornelius Moras and RIJ CVC line acceptable. Single attempt without complication.   [name]

## 2020-09-22 NOTE — Hospital Course (Addendum)
History of Present Illness:  Patient is 80 year old moderately overweight male with history of mitral valve prolapse, mitral regurgitation, hypertension, hyperlipidemia, hypothyroidism, and asymptomatic cerebrovascular disease who has been referred for surgical consultation to discuss treatment options for management of mitral valve prolapse with severe mitral regurgitation.   Patient has known of presence of heart murmur and mitral valve prolapse for more than 20 years.  He has been followed up for many years by Dr. Allyson Sabal.  Follow-up echocardiogram performed December 2020 revealed normal left ventricular systolic function with mitral valve prolapse and what was felt to be moderate mitral regurgitation.  The patient is always remained active physically and without significant physical limitations.  Recent echocardiogram performed March 10, 2020 revealed normal left ventricular size and systolic function with mitral valve prolapse and severe mitral regurgitation.  There was mild left atrial enlargement.  The patient subsequently underwent transesophageal echocardiogram May 04, 2020.  TEE revealed mitral valve prolapse with an obvious flail segment involving the middle scallop (P2) of the posterior leaflet causing severe mitral regurgitation.  Left ventricular size and systolic function remain normal.  There was mild left atrial enlargement.  No other significant abnormalities were noted.  Cardiothoracic surgical consultation was requested.  He was evaluated by Dr. Cornelius Moras who felt the patient would benefit from surgical intervention.  The risks and benefits of the procedure were explained to the patient and he was agreeable to proceed.  Hospital Course:  Mr. Boy presented to [facility] on 09/22/2020.  He underwent Minimally Invasive Mitral Valve Repair with a 28 mm Simuform ring and placement of Neo-chords x 10.  He tolerated the procedure without difficulty, was extubated, and was taken to the  SICU in stable condition.  The patient required Neo-Synephrine post operatively.  This was weaned as hemodynamics allowed.  He was started on coumadin therapy for his MV Repair.  He was treated with Toradol for increased pain.  He was started on IV lasix for hypervolemic state.  He developed a slight rise in his creatinine up to 1.15 but this did improve.  This was felt to be related to his Toradol use.  He was restarted on Levothryoxine for his hypothyroidism. He was maintaining NSR and his pacing wires were removed without difficulty.  He was felt stable for transfer to the progressive care unit on 09/26/2020.  His chest tube output decreased and his chest tubes were removed on 09/26/2020.  Follow up CXR showed improvement with decreased right basilar atelectasis and pleural effusions.  He developed Atrial Fibrillation.  He was treated with Amiodarone and Cardizem to convert to NSR.  His Cardizem was discontinued prior to discharge.  He was started on Lopressor for additional HR control.  He was hypertensive and restarted on his home Cozaar at a reduced dose. Epicardial pacing wires were removed on 05/30. He remains on coumadin. His INR went up to 2.7 so Coumadin was held a few days. It was restarted at 1 mg on 05/30. The most recent INR of 1.7.  He will be discharged home on 2 mg of coumadin.  He is ambulating independently.  His surgical incisions are healing without evidence of infection.  He is medically stable for discharge home today.

## 2020-09-22 NOTE — Progress Notes (Signed)
  Echocardiogram Echocardiogram Transesophageal has been performed.  Delcie Roch 09/22/2020, 8:42 AM

## 2020-09-22 NOTE — Anesthesia Procedure Notes (Signed)
Arterial Line Insertion Start/End5/25/2022 6:40 AM, 09/22/2020 6:45 AM Performed by: Atilano Median, DO, Lynnell Chad, CRNA, CRNA  Lidocaine 1% used for infiltration and patient sedated Right, radial was placed Catheter size: 20 G Hand hygiene performed   Attempts: 1 Procedure performed without using ultrasound guided technique. Following insertion, dressing applied and Biopatch. Patient tolerated the procedure well with no immediate complications.

## 2020-09-22 NOTE — Anesthesia Procedure Notes (Signed)
Central Venous Catheter Insertion Performed by: Atilano Median, DO, anesthesiologist Start/End5/25/2022 7:36 AM, 09/22/2020 7:37 AM Patient location: Pre-op. Preanesthetic checklist: patient identified, IV checked, site marked, risks and benefits discussed, surgical consent, monitors and equipment checked, pre-op evaluation, timeout performed and anesthesia consent Position: supine Hand hygiene performed  and maximum sterile barriers used  PA cath was placed.Swan type:thermodilution PA Cath depth:45 Procedure performed without using ultrasound guided technique. Attempts: 1 Patient tolerated the procedure well with no immediate complications.

## 2020-09-22 NOTE — Transfer of Care (Signed)
Immediate Anesthesia Transfer of Care Note  Patient: Chad Berry  Procedure(s) Performed: MINIMALLY INVASIVE MITRAL VALVE REPAIR (MVR) USING MEDTRONIC SIMUFORM SEMI-RIGID ANNULOPLASTY RING SIZE ON PUMP (Right Chest) TRANSESOPHAGEAL ECHOCARDIOGRAM (TEE) (N/A )  Patient Location: PACU and ICU  Anesthesia Type:General  Level of Consciousness: drowsy  Airway & Oxygen Therapy: Patient Spontanous Breathing and Patient connected to face mask oxygen  Post-op Assessment: Report given to RN and Post -op Vital signs reviewed and stable  Post vital signs: Reviewed and stable  Last Vitals:  Vitals Value Taken Time  BP 102/78 09/22/20 1530  Temp 35.9 C 09/22/20 1534  Pulse 78 09/22/20 1534  Resp 14 09/22/20 1534  SpO2 99 % 09/22/20 1534  Vitals shown include unvalidated device data.  Last Pain:  Vitals:   09/22/20 0648  TempSrc:   PainSc: 0-No pain         Complications: No complications documented.

## 2020-09-22 NOTE — Op Note (Signed)
CARDIOTHORACIC SURGERY OPERATIVE NOTE  Date of Procedure:  09/22/2020  Preoperative Diagnosis: Severe Mitral Regurgitation  Postoperative Diagnosis: Same  Procedure:    Minimally-Invasive Mitral Valve Repair  Complex valvuloplasty including artificial PTFE neochord placement x10  Medtronic Simuform Ring Annuloplasty (size 28mm, catalog # [id number], serial # [id number])    Surgeon: Salvatore Decentlarence H. Cornelius Moraswen, MD  Assistant: Lowella DandyErin Barrett, PA-C  Anesthesia: Hester MatesGregory Stoltzfus, DO  Operative Findings:  Fibroelastic deficiency type myxomatous degenerative disease  Multiple elongated and ruptured primary chordae tendinae  Type II dysfunction with severe mitral regurgitation  Normal left ventricular systolic function  No residual mitral regurgitation after successful valve repair                     BRIEF CLINICAL NOTE AND INDICATIONS FOR SURGERY  Patient is 80 year old moderately overweight male with history of mitral valve prolapse, mitral regurgitation, hypertension, hyperlipidemia, hypothyroidism, and asymptomatic cerebrovascular disease who has been referred for surgical consultation to discuss treatment options for management of mitral valve prolapse with severe mitral regurgitation.  Patient has known of presence of heart murmur and mitral valve prolapse for more than 20 years. He has been followed up for many years by Dr. Allyson SabalBerry. Follow-up echocardiogram performed December 2020 revealed normal left ventricular systolic function with mitral valve prolapse and what was felt to be moderate mitral regurgitation. The patient is always remained active physically and without significant physical limitations. Recent echocardiogram performed March 10, 2020 revealed normal left ventricular size and systolic function with mitral valve prolapse and severe mitral regurgitation. There was mild left atrial enlargement. The patient subsequently underwent transesophageal  echocardiogram May 04, 2020. TEE revealed mitral valve prolapse with an obvious flail segment involving the middle scallop (P2) of the posterior leaflet causing severe mitral regurgitation. Left ventricular size and systolic function remain normal. There was mild left atrial enlargement. No other significant abnormalities were noted. Cardiothoracic surgical consultation was requested.  The patient has been seen in consultation and counseled at length regarding the indications, risks and potential benefits of surgery.  All questions have been answered, and the patient provides full informed consent for the operation as described.    DETAILS OF THE OPERATIVE PROCEDURE  Preparation:  The patient is brought to the operating room on the above mentioned date and central monitoring was established by the anesthesia team including placement of Swan-Ganz catheter through the right internal jugular vein.  A radial arterial line is placed. The patient is placed in the supine position on the operating table.  Intravenous antibiotics are administered. General endotracheal anesthesia is induced uneventfully. The patient is initially intubated using a dual lumen endotracheal tube.  A Foley catheter is placed.  Baseline transesophageal echocardiogram was performed.  Findings were notable for myxomatous degenerative disease of the mitral valve with large flail segment involving the middle scallop (P2) of the posterior leaflet.  There is severe mitral regurgitation with flow reversal in the pulmonary veins.  There is normal left ventricular systolic function.  No other abnormalities are noted.  A soft roll is placed behind the patient's left scapula and the neck gently extended and turned to the left.   The patient's right neck, chest, abdomen, both groins, and both lower extremities are prepared and draped in a sterile manner. A time out procedure is performed.   Percutaneous Vascular Access:  Percutaneous  arterial and venous access were obtained on the right side.  Using ultrasound guidance the right common femoral vein was cannulated using the Seldinger  technique a pair of Perclose vascular closure devises were placed at opposing 30 degree angles in the femoral vein, after which time an 8 French sheath inserted.  The right common femoral artery was cannulated using a micropuncture wire and sheath.  A pair of Perclose vascular closure devices were placed at opposing 30 degree angles in the femoral artery, and a 8 French sheath inserted.  The right internal jugular vein was cannulated  using ultrasound guidance and an 8 French sheath inserted.     Surgical Approach:  A right miniature anterolateral thoracotomy incision is performed. The incision is placed just lateral to and superior to the right nipple. The pectoralis major muscle is retracted medially and completely preserved. The right pleural space is entered through the 3rd intercostal space. A soft tissue retractor is placed.  Two 11 mm ports are placed through separate stab incisions inferiorly. The right pleural space is insufflated continuously with carbon dioxide gas through the posterior port during the remainder of the operation.  A pledgeted sutures placed through the dome of the right hemidiaphragm and retracted inferiorly to facilitate exposure.  A longitudinal incision is made in the pericardium 3 cm anterior to the phrenic nerve and silk traction sutures are placed on either side of the incision for exposure.   Extracorporeal Cardiopulmonary Bypass and Myocardial Protection:   The patient was heparinized systemically.  The right common femoral vein is cannulated through the venous sheath and a guidewire advanced into the right atrium using TEE guidance.  The femoral vein cannulated using a 22 Fr long femoral venous cannula.  The right common femoral artery is cannulated through the arterial sheath and a guidewire advanced into the descending  thoracic aorta using TEE guidance.  Femoral artery is cannulated with a 18 French femoral arterial cannula.  The right internal jugular vein is cannulated through the venous sheath and a guidewire advanced into the right atrium.  The internal jugular vein is cannulated using a 15 [place] pediatric femoral venous cannula.   Adequate heparinization is verified.   The entire pre-bypass portion of the operation was notable for stable hemodynamics.  Cardiopulmonary bypass was begun.  Vacuum assist venous drainage is utilized. The incision in the pericardium is extended in both directions. Venous drainage and exposure are notably excellent. A retrograde cardioplegia cannula is placed through the right atrium into the coronary sinus using transesophageal echocardiogram guidance.  An antegrade cardioplegia cannula is placed in the ascending aorta.    The patient is cooled to 32C systemic temperature.  The aortic cross clamp is applied and cardioplegia is delivered initially in an antegrade fashion through the aortic root using modified [place] cold blood cardioplegia ([place] blood cardioplegia protocol).   The initial cardioplegic arrest is rapid with early diastolic arrest.   Myocardial protection was felt to be excellent.   Mitral Valve Repair:  A left atriotomy incision was performed through the interatrial groove and extended partially across the back wall of the left atrium after opening the oblique sinus inferiorly.  The mitral valve is exposed using a self-retaining retractor.  The mitral valve was inspected and notable for fibroelastic deficiency type myxomatous degenerative disease.  There is a large flail portion of the middle scallop (P2) of the posterior leaflet.  There are multiple elongated and ruptured primary chordae tendinae.  The remainder the valve appears essentially normal.  Artificial neochord placement was performed using Chord-X multi-strand CV-4 Goretex pre-measured loops.  The  appropriate cord length (16 mm) was measured from  corresponding normal length primary cords from the P1 segment of the posterior leaflet. The papillary muscle suture of a Chord-X multi-strand suture was placed through the head of the posteromedial papillary muscle in a horizontal mattress fashion and tied over Teflon felt pledgets. Each of the three pre-measured loops were then reimplanted into the free margin of the P2 segment of the posterior leaflet on the posterior side of midline.  The papillary muscle suture of a second Chord-X multi-strand suture was placed through the head of the anterolateral papillary muscle in a horizontal mattress fashion and tied over Teflon felt pledgets. Two of the three pre-measured loops were then reimplanted into the free margin of the P2 segment of the posterior leaflet on the anterior side of midline.  The remaining pair of cords were discarded.  Interrupted 2-0 Ethibond horizontal mattress sutures are placed circumferentially around the entire mitral valve annulus. The sutures will ultimately be utilized for ring annuloplasty, and at this juncture there are utilized to suspend the valve symmetrically.  The valve was tested with saline and appeared competent even without ring annuloplasty complete. The valve was sized to a 28 mm annuloplasty ring, based upon the transverse distance between the left and right commissures and the height of the anterior leaflet, corresponding to a size just slightly larger than the overall surface area of the anterior leaflet.  A Medtronic Simuform annuloplasty ring (size 14mm, catalog [id number], serial [id number]) was secured in place uneventfully. All ring sutures were secured using a Cor-knot device.    The valve was tested with saline and appeared competent. There is no residual leak. There was a broad, symmetrical line of coaptation of the anterior and posterior leaflet which was confirmed using the blue ink test.  Rewarming is  begun.   Procedure Completion:  The atriotomy was closed using a 2-layer closure of running 3-0 Prolene suture after placing a sump drain across the mitral valve to serve as a left ventricular vent.  One final dose of warm retrograde "reanimation dose" cardioplegia was administered retrograde through the coronary sinus catheter while all air was evacuated through the aortic root.  The aortic cross clamp was removed after a total cross clamp time of 112 minutes.  Epicardial pacing wires are fixed to the inferior wall of the right ventricule and to the right atrial appendage. The patient is rewarmed to 37C temperature. The left ventricular vent and antegrade cardioplegia cannula are removed.  The pericardial sac was drained using a 32 French Bard drain placed through the anterior port incision. The patient is weaned and disconnected from cardiopulmonary bypass.  The patient's rhythm at separation from bypass was sinus.  The patient was weaned from bypass without any inotropic support. Total cardiopulmonary bypass time for the operation was 162 minutes.  Followup transesophageal echocardiogram performed after separation from bypass revealed  a well-seated annuloplasty ring in the mitral position with a normal functioning mitral valve. There was no residual leak.  Left ventricular function was unchanged from preoperatively.  The mean gradient across the mitral valve was estimated to be 3 mmHg.  The femoral arterial and venous cannulas were removed and all Perclose sutures secured.  Manual pressure was maintained while Protamine was administered.  The right internal jugular cannula was removed and manual pressure held on the neck and groin for 15 minutes.  Single lung ventilation was begun. The atriotomy closure was inspected for hemostasis.  The right pleural space is irrigated with saline solution and inspected for hemostasis.   A  mixture of Exparel liposomal bupivacaine (20 mL) and 0.5% bupivacaine (30  mL) is utilized to create an intercostal nerve block for postoperative analgesia.  The mixture is injected under direct vision into the intercostal neurovascular bundles posteriorly to cover the second through the sixth intercostal nerve roots.  Portions of the solution are also injected into the intercostal neurovascular bundles immediately surrounding the surgical incision and immediately adjacent to the chest tube exit sites.  The right pleural space was drained using a 32 French Bard drain placed through the posterior port incision. The miniature thoracotomy incision was closed in multiple layers in routine fashion.   The post-bypass portion of the operation was notable for stable rhythm and hemodynamics.  The patient received 2 units packed red blood cells during the procedure due to anemia which was present preoperatively and exacerbated by acute blood loss and hemodilution during cardiopulmonary bypass.  The patient received a total of 1 pack adult platelets due to coagulopathy and thrombocytopenia after separation from cardiopulmonary bypass and reversal of heparin with protamine which was verified using thromboelastography.    Disposition:  The patient tolerated the procedure well.  The patient was extubated in the operating room and subsequently transported to the surgical intensive care unit in stable condition. There were no intraoperative complications. All sponge instrument and needle counts are verified correct at completion of the operation.     Salvatore Decent. Cornelius Moras MD 09/22/2020 2:27 PM

## 2020-09-23 ENCOUNTER — Inpatient Hospital Stay (HOSPITAL_COMMUNITY): Payer: 59

## 2020-09-23 ENCOUNTER — Encounter (HOSPITAL_COMMUNITY): Payer: Self-pay | Admitting: Thoracic Surgery (Cardiothoracic Vascular Surgery)

## 2020-09-23 LAB — ECHO INTRAOPERATIVE TEE
AR max vel: 3.18 cm2
AV Area VTI: 3.39 cm2
AV Area mean vel: 3.94 cm2
AV Mean grad: 3 mmHg
AV Peak grad: 7.3 mmHg
Ao pk vel: 1.35 m/s
Area-P 1/2: 3.45 cm2
Height: 71 in
MV M vel: 4.7 m/s
MV Peak grad: 88.4 mmHg
MV VTI: 3.39 cm2
Radius: 1.1 cm
S' Lateral: 3.55 cm
Weight: 3583.8 oz

## 2020-09-23 LAB — TYPE AND SCREEN
ABO/RH(D): A POS
Antibody Screen: NEGATIVE
Unit division: 0
Unit division: 0

## 2020-09-23 LAB — GLUCOSE, CAPILLARY
Glucose-Capillary: 102 mg/dL — ABNORMAL HIGH (ref 70–99)
Glucose-Capillary: 106 mg/dL — ABNORMAL HIGH (ref 70–99)
Glucose-Capillary: 115 mg/dL — ABNORMAL HIGH (ref 70–99)
Glucose-Capillary: 116 mg/dL — ABNORMAL HIGH (ref 70–99)
Glucose-Capillary: 116 mg/dL — ABNORMAL HIGH (ref 70–99)
Glucose-Capillary: 121 mg/dL — ABNORMAL HIGH (ref 70–99)
Glucose-Capillary: 139 mg/dL — ABNORMAL HIGH (ref 70–99)
Glucose-Capillary: 93 mg/dL (ref 70–99)

## 2020-09-23 LAB — CBC
HCT: 29.2 % — ABNORMAL LOW (ref 39.0–52.0)
HCT: 29.3 % — ABNORMAL LOW (ref 39.0–52.0)
Hemoglobin: 10.2 g/dL — ABNORMAL LOW (ref 13.0–17.0)
Hemoglobin: 10.2 g/dL — ABNORMAL LOW (ref 13.0–17.0)
MCH: 32.4 pg (ref 26.0–34.0)
MCH: 32.5 pg (ref 26.0–34.0)
MCHC: 34.9 g/dL (ref 30.0–36.0)
MCHC: 34.8 g/dL (ref 30.0–36.0)
MCV: 92.7 fL (ref 80.0–100.0)
MCV: 93.3 fL (ref 80.0–100.0)
Platelets: 105 10*3/uL — ABNORMAL LOW (ref 150–400)
Platelets: 101 10*3/uL — ABNORMAL LOW (ref 150–400)
RBC: 3.15 MIL/uL — ABNORMAL LOW (ref 4.22–5.81)
RBC: 3.14 MIL/uL — ABNORMAL LOW (ref 4.22–5.81)
RDW: 13.2 % (ref 11.5–15.5)
RDW: 13.7 % (ref 11.5–15.5)
WBC: 6.8 10*3/uL (ref 4.0–10.5)
WBC: 7.6 10*3/uL (ref 4.0–10.5)
nRBC: 0 % (ref 0.0–0.2)
nRBC: 0 % (ref 0.0–0.2)

## 2020-09-23 LAB — BPAM RBC
Blood Product Expiration Date: 202206112359
Blood Product Expiration Date: 202206152359
ISSUE DATE / TIME: 202205251423
ISSUE DATE / TIME: 202205251423
Unit Type and Rh: 6200
Unit Type and Rh: 6200

## 2020-09-23 LAB — BASIC METABOLIC PANEL
Anion gap: 4 — ABNORMAL LOW (ref 5–15)
Anion gap: 6 (ref 5–15)
BUN: 9 mg/dL (ref 8–23)
BUN: 15 mg/dL (ref 8–23)
CO2: 21 mmol/L — ABNORMAL LOW (ref 22–32)
CO2: 22 mmol/L (ref 22–32)
Calcium: 7 mg/dL — ABNORMAL LOW (ref 8.9–10.3)
Calcium: 7.4 mg/dL — ABNORMAL LOW (ref 8.9–10.3)
Chloride: 110 mmol/L (ref 98–111)
Chloride: 104 mmol/L (ref 98–111)
Creatinine, Ser: 0.9 mg/dL (ref 0.61–1.24)
Creatinine, Ser: 1.08 mg/dL (ref 0.61–1.24)
GFR, Estimated: >60 mL/min (ref >60)
GFR, Estimated: >60 mL/min (ref >60)
Glucose, Bld: 106 mg/dL — ABNORMAL HIGH (ref 70–99)
Glucose, Bld: 130 mg/dL — ABNORMAL HIGH (ref 70–99)
Potassium: 4.1 mmol/L (ref 3.5–5.1)
Potassium: 4.1 mmol/L (ref 3.5–5.1)
Sodium: 135 mmol/L (ref 135–145)
Sodium: 132 mmol/L — ABNORMAL LOW (ref 135–145)

## 2020-09-23 LAB — BPAM PLATELET PHERESIS
Blood Product Expiration Date: 202205252359
Blood Product Expiration Date: 202205262359
ISSUE DATE / TIME: 202205251422
ISSUE DATE / TIME: 202205251422
Unit Type and Rh: 5100
Unit Type and Rh: 6200

## 2020-09-23 LAB — PREPARE PLATELET PHERESIS
Unit division: 0
Unit division: 0

## 2020-09-23 LAB — PREPARE FRESH FROZEN PLASMA
Unit division: 0
Unit division: 0

## 2020-09-23 LAB — MAGNESIUM
Magnesium: 2.2 mg/dL (ref 1.7–2.4)
Magnesium: 2.4 mg/dL (ref 1.7–2.4)

## 2020-09-23 LAB — BPAM FFP
Blood Product Expiration Date: 202205252359
Blood Product Expiration Date: 202205292359
ISSUE DATE / TIME: 202205251823
ISSUE DATE / TIME: 202205251823
Unit Type and Rh: 600
Unit Type and Rh: 6200

## 2020-09-23 MED ORDER — ENOXAPARIN SODIUM 30 MG/0.3ML IJ SOSY
30.0000 mg | PREFILLED_SYRINGE | Freq: Every day | INTRAMUSCULAR | Status: DC
  Administered 2020-09-24: 30 mg via SUBCUTANEOUS
  Filled 2020-09-23: qty 0.3

## 2020-09-23 MED ORDER — ROSUVASTATIN CALCIUM 20 MG PO TABS
40.0000 mg | ORAL_TABLET | Freq: Every day | ORAL | Status: DC
  Administered 2020-09-26 – 2020-09-28 (×3): 40 mg via ORAL
  Filled 2020-09-23 (×3): qty 2

## 2020-09-23 MED ORDER — MELATONIN 3 MG PO TABS
3.0000 mg | ORAL_TABLET | Freq: Every day | ORAL | Status: DC
  Administered 2020-09-23 – 2020-09-28 (×6): 3 mg via ORAL
  Filled 2020-09-23 (×6): qty 1

## 2020-09-23 MED ORDER — ASPIRIN EC 81 MG PO TBEC
81.0000 mg | DELAYED_RELEASE_TABLET | Freq: Every day | ORAL | Status: DC
  Administered 2020-09-24 – 2020-09-29 (×7): 81 mg via ORAL
  Filled 2020-09-23 (×6): qty 1

## 2020-09-23 MED ORDER — INSULIN ASPART 100 UNIT/ML IJ SOLN
0.0000 [IU] | INTRAMUSCULAR | Status: DC
  Administered 2020-09-23 – 2020-09-24 (×2): 2 [IU] via SUBCUTANEOUS

## 2020-09-23 MED ORDER — ASPIRIN EC 325 MG PO TBEC
325.0000 mg | DELAYED_RELEASE_TABLET | Freq: Every day | ORAL | Status: AC
  Administered 2020-09-23: 325 mg via ORAL
  Filled 2020-09-23: qty 1

## 2020-09-23 MED ORDER — WARFARIN SODIUM 2.5 MG PO TABS
2.5000 mg | ORAL_TABLET | Freq: Every day | ORAL | Status: DC
  Administered 2020-09-23 – 2020-09-24 (×2): 2.5 mg via ORAL
  Filled 2020-09-23 (×2): qty 1

## 2020-09-23 MED ORDER — WARFARIN - PHYSICIAN DOSING INPATIENT: Freq: Every day | Status: DC

## 2020-09-23 MED ORDER — FUROSEMIDE 10 MG/ML IJ SOLN
20.0000 mg | Freq: Once | INTRAMUSCULAR | Status: AC
  Administered 2020-09-23: 20 mg via INTRAVENOUS
  Filled 2020-09-23: qty 2

## 2020-09-23 MED ORDER — EZETIMIBE 10 MG PO TABS
10.0000 mg | ORAL_TABLET | Freq: Every day | ORAL | Status: DC
  Administered 2020-09-26 – 2020-09-28 (×3): 10 mg via ORAL
  Filled 2020-09-23 (×3): qty 1

## 2020-09-23 MED ORDER — KETOROLAC TROMETHAMINE 15 MG/ML IJ SOLN
15.0000 mg | Freq: Four times a day (QID) | INTRAMUSCULAR | Status: AC
  Administered 2020-09-23 – 2020-09-24 (×5): 15 mg via INTRAVENOUS
  Filled 2020-09-23 (×5): qty 1

## 2020-09-23 NOTE — Progress Notes (Signed)
Patient ID: Chad Berry, male   DOB: 1941-03-17, 80 y.o.   MRN: [id number]  TCTS Evening Rounds:   Hemodynamically stable but still on neo 30.   Urine output ok CT output low  CBC    Component Value Date/Time   WBC 6.8 09/23/2020 0510   RBC 3.15 (L) 09/23/2020 0510   HGB 10.2 (L) 09/23/2020 0510   HGB 14.0 08/25/2020 0954   HCT 29.2 (L) 09/23/2020 0510   HCT 40.0 08/25/2020 0954   PLT 105 (L) 09/23/2020 0510   PLT 157 08/25/2020 0954   MCV 92.7 09/23/2020 0510   MCV 95 08/25/2020 0954   MCH 32.4 09/23/2020 0510   MCHC 34.9 09/23/2020 0510   RDW 13.2 09/23/2020 0510   RDW 12.5 08/25/2020 0954     BMET    Component Value Date/Time   NA 135 09/23/2020 0510   NA 141 08/25/2020 0954   K 4.1 09/23/2020 0510   CL 110 09/23/2020 0510   CO2 21 (L) 09/23/2020 0510   GLUCOSE 106 (H) 09/23/2020 0510   BUN 9 09/23/2020 0510   BUN 16 08/25/2020 0954   CREATININE 0.90 09/23/2020 0510   CALCIUM 7.0 (L) 09/23/2020 0510   GFRNONAA >60 09/23/2020 0510   GFRAA 71 05/03/2020 0959     A/P:  Stable postop course. Continue current plans

## 2020-09-23 NOTE — Discharge Instructions (Addendum)
Discharge Instructions:  1. You may shower, please wash incisions daily with soap and water and keep dry.  If you wish to cover wounds with dressing you may do so but please keep clean and change daily.  No tub baths or swimming until incisions have completely healed.  If your incisions become red or develop any drainage please call our office at [phone]  2. No Driving until cleared by Dr. Orvan July office and you are no longer using narcotic pain medications  3. Monitor your weight daily.. Please use the same scale and weigh at same time... If you gain 5-10 lbs in 48 hours with associated lower extremity swelling, please contact our office at [phone]  4. Fever of 101.5 for at least 24 hours with no source, please contact our office at [phone]  5. Activity- up as tolerated, please walk at least 3 times per day.  Avoid strenuous activity, no lifting, pushing, or pulling with your arms over 8-10 lbs for a minimum of 6 weeks  6. If any questions or concerns arise, please do not hesitate to contact our office at [phone]   Information on my medicine - Coumadin   (Warfarin)  Why was Coumadin prescribed for you? Coumadin was prescribed for you because you have a medical condition that can cause an increased risk of forming blood clots. Blood clots can cause serious health problems by blocking the flow of blood to the heart, lung, or brain. Coumadin can prevent harmful blood clots from forming. 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 While on Coumadin (warfarin) you will need to have an INR test regularly to ensure that your dose is keeping you in the desired range. The INR (international normalized ratio) number is calculated from the result of the laboratory test called prothrombin time (PT).  If an INR APPOINTMENT HAS NOT ALREADY BEEN MADE FOR YOU please schedule an  appointment to have this lab work done by your health care provider within 7 days. Your INR goal is usually a number between:  2 to 3 or your provider may give you a more narrow range like 2-2.5.  Ask your health care provider during an office visit what your goal INR is.  What  do you need to  know  About  COUMADIN? Take Coumadin (warfarin) exactly as prescribed by your healthcare provider about the same time each day.  DO NOT stop taking without talking to the doctor who prescribed the medication.  Stopping without other blood clot prevention medication to take the place of Coumadin may increase your risk of developing a new clot or stroke.  Get refills before you run out.  What do you do if you miss a dose? If you miss a dose, take it as soon as you remember on the same day then continue your regularly scheduled regimen the next day.  Do not take two doses of Coumadin at the same time.  Important Safety Information A possible side effect of Coumadin (Warfarin) is an increased risk of bleeding. You should call your healthcare provider right away if you experience any of the following: ? Bleeding from an injury or your nose that does not stop. ? Unusual colored urine (red or dark brown) or unusual colored stools (red or black). ? Unusual bruising for unknown reasons. ? A serious fall or if you hit your head (even if there is no bleeding).  Some foods or medicines interact with Coumadin (warfarin) and might alter your response to warfarin. To help avoid this: ? Eat a balanced diet, maintaining a consistent amount of Vitamin K. ? Notify your provider about major diet changes you plan to make. ? Avoid alcohol or limit your intake to 1 drink for women and 2 drinks for men per day. (1 drink is 5 oz. wine, 12 oz. beer, or 1.5 oz. liquor.)  Make sure that ANY health care provider who prescribes medication for you knows that you are taking Coumadin (warfarin).  Also make sure the healthcare provider  who is monitoring your Coumadin knows when you have started a new medication including herbals and non-prescription products.  Coumadin (Warfarin)  Major Drug Interactions  Increased Warfarin Effect Decreased Warfarin Effect  Alcohol (large quantities) Antibiotics (esp. Septra/Bactrim, Flagyl, Cipro) Amiodarone (Cordarone) Aspirin (ASA) Cimetidine (Tagamet) Megestrol (Megace) NSAIDs (ibuprofen, naproxen, etc.) Piroxicam (Feldene) Propafenone (Rythmol SR) Propranolol (Inderal) Isoniazid (INH) Posaconazole (Noxafil) Barbiturates (Phenobarbital) Carbamazepine (Tegretol) Chlordiazepoxide (Librium) Cholestyramine (Questran) Griseofulvin Oral Contraceptives Rifampin Sucralfate (Carafate) Vitamin K   Coumadin (Warfarin) Major Herbal Interactions  Increased Warfarin Effect Decreased Warfarin Effect  Garlic Ginseng Ginkgo biloba Coenzyme Q10 Green tea [place] Bertie's wort    Coumadin (Warfarin) FOOD Interactions  Eat a consistent number of servings per week of foods HIGH in Vitamin K (1 serving =  cup)  Collards (cooked, or boiled & drained) Kale (cooked, or boiled & drained) Mustard greens (cooked, or boiled & drained) Parsley *serving size only =  cup Spinach (cooked, or boiled & drained) Swiss chard (cooked, or boiled & drained) Turnip greens (cooked, or boiled & drained)  Eat a consistent number of servings per week of foods MEDIUM-HIGH in Vitamin K (1 serving = 1 cup)  Asparagus (cooked, or boiled & drained) Broccoli (cooked, boiled & drained, or raw & chopped) Brussel sprouts (cooked, or boiled & drained) *serving size only =  cup Lettuce, raw (green leaf, endive, romaine) Spinach, raw Turnip greens, raw & chopped   These websites have more information on Coumadin (warfarin):  http://www.king-russell.com/; https://www.hines.net/;

## 2020-09-23 NOTE — Anesthesia Postprocedure Evaluation (Signed)
Anesthesia Post Note  Patient: Chad Berry  Procedure(s) Performed: MINIMALLY INVASIVE MITRAL VALVE REPAIR (MVR) USING MEDTRONIC SIMUFORM SEMI-RIGID ANNULOPLASTY RING SIZE ON PUMP (Right Chest) TRANSESOPHAGEAL ECHOCARDIOGRAM (TEE) (N/A )     Patient location during evaluation: SICU Anesthesia Type: General Level of consciousness: oriented Pain management: pain level controlled Vital Signs Assessment: post-procedure vital signs reviewed and stable Respiratory status: patient connected to face mask oxygen Cardiovascular status: stable Postop Assessment: no apparent nausea or vomiting Anesthetic complications: no   No complications documented.  Last Vitals:  Vitals:   09/23/20 0930 09/23/20 0945  BP:    Pulse: 74 73  Resp: 19 19  Temp:    SpO2: 98% 98%    Last Pain:  Vitals:   09/23/20 0800  TempSrc: Core  PainSc: 5                  Ahriana Gunkel P Shiane Wenberg

## 2020-09-23 NOTE — Progress Notes (Addendum)
      [address]       [name] [address]             [phone]      1 Day Post-Op Procedure(s) (LRB): MINIMALLY INVASIVE MITRAL VALVE REPAIR (MVR) USING MEDTRONIC SIMUFORM SEMI-RIGID ANNULOPLASTY RING SIZE ON PUMP (Right) TRANSESOPHAGEAL ECHOCARDIOGRAM (TEE) (N/A)   Subjective:  Patient complains of pain.  Had some nausea earlier.   Objective: Vital signs in last 24 hours: Temp:  [96.4 F (35.8 C)-100.22 F (37.9 C)] 100.22 F (37.9 C) (05/26 0700) Pulse Rate:  [75-82] 78 (05/26 0700) Resp:  [6-28] 26 (05/26 0700) BP: (89-118)/(22-78) 99/60 (05/26 0700) SpO2:  [87 %-100 %] 98 % (05/26 0700) Arterial Line BP: (81-142)/(42-71) 102/46 (05/26 0700) Weight:  [108.2 kg] 108.2 kg (05/26 0500)  Hemodynamic parameters for last 24 hours: PAP: (24-60)/(2-21) 47/15 CO:  [3.9 L/min-6 L/min] 5.4 L/min CI:  [1.7 L/min/m2-2.7 L/min/m2] 2.5 L/min/m2  Intake/Output from previous day: 05/25 0701 - 05/26 0700 In: 9054.1 [I.V.:4840.7; Blood:1659.3; IV Piggyback:2554] Out: 4515 [Urine:3365; Chest Tube:1150]  General appearance: alert, cooperative and no distress Heart: regular rate and rhythm Lungs: clear to auscultation bilaterally Abdomen: soft, non-tender; bowel sounds normal; no masses,  no organomegaly Extremities: edema trace Wound: aquacel in place  Lab Results: Recent Labs    09/22/20 2149 09/22/20 2309 09/23/20 0510  WBC 5.8  --  6.8  HGB 9.9* 7.5* 10.2*  HCT 28.1* 22.0* 29.2*  PLT 86*  --  105*   BMET:  Recent Labs    09/22/20 2149 09/22/20 2309 09/23/20 0510  NA 131* 141 135  K 3.9 3.5 4.1  CL 104  --  110  CO2 21*  --  21*  GLUCOSE 165*  --  106*  BUN 9  --  9  CREATININE 0.81  --  0.90  CALCIUM 6.1*  --  7.0*    PT/INR:  Recent Labs    09/22/20 1532  LABPROT 20.0*  INR 1.7*   ABG    Component Value Date/Time   PHART 7.371 09/22/2020 2309   HCO3 19.1 (L) 09/22/2020 2309   TCO2 20 (L) 09/22/2020 2309   ACIDBASEDEF  5.0 (H) 09/22/2020 2309   O2SAT 99.0 09/22/2020 2309   CBG (last 3)  Recent Labs    09/23/20 0157 09/23/20 0315 09/23/20 0510  GLUCAP 93 102* 121*    Assessment/Plan: S/P Procedure(s) (LRB): MINIMALLY INVASIVE MITRAL VALVE REPAIR (MVR) USING MEDTRONIC SIMUFORM SEMI-RIGID ANNULOPLASTY RING SIZE ON PUMP (Right) TRANSESOPHAGEAL ECHOCARDIOGRAM (TEE) (N/A)  1. CV- NSR, on Neo for BP support- wean as hemodynamics allow, start coumadin for his MV Repair 2. Pulm- CT ouptut 1150 since surgery, leave in place today, CXR shows bilateral atelectasis 3. Renal- creatinine, lytes okay- no Lasix until off Neo 4. Expected post operative blood loss anemia- Hgb at 10.2 5. Mild post operative thrombocytopenia- monitor 6. CBGs controlled, transition to SSIP 7. Dispo- patient stable, wean Neo as hemodynamics allow, coumadin for MV Repair, hold off on diuretics for now, POD #1 progression   LOS: 1 day    Lowella Dandy, PA-C 09/23/2020    I have seen and examined the patient and agree with the assessment and plan as outlined.  Overall doing well POD1  Purcell Nails, MD 09/23/2020 8:34 AM

## 2020-09-24 ENCOUNTER — Inpatient Hospital Stay (HOSPITAL_COMMUNITY): Payer: 59

## 2020-09-24 LAB — BASIC METABOLIC PANEL
Anion gap: 4 — ABNORMAL LOW (ref 5–15)
BUN: 17 mg/dL (ref 8–23)
CO2: 25 mmol/L (ref 22–32)
Calcium: 7.4 mg/dL — ABNORMAL LOW (ref 8.9–10.3)
Chloride: 104 mmol/L (ref 98–111)
Creatinine, Ser: 1.15 mg/dL (ref 0.61–1.24)
GFR, Estimated: >60 mL/min (ref >60)
Glucose, Bld: 117 mg/dL — ABNORMAL HIGH (ref 70–99)
Potassium: 4 mmol/L (ref 3.5–5.1)
Sodium: 133 mmol/L — ABNORMAL LOW (ref 135–145)

## 2020-09-24 LAB — GLUCOSE, CAPILLARY
Glucose-Capillary: 114 mg/dL — ABNORMAL HIGH (ref 70–99)
Glucose-Capillary: 124 mg/dL — ABNORMAL HIGH (ref 70–99)
Glucose-Capillary: 125 mg/dL — ABNORMAL HIGH (ref 70–99)
Glucose-Capillary: 127 mg/dL — ABNORMAL HIGH (ref 70–99)

## 2020-09-24 LAB — CBC
HCT: 28.2 % — ABNORMAL LOW (ref 39.0–52.0)
Hemoglobin: 9.8 g/dL — ABNORMAL LOW (ref 13.0–17.0)
MCH: 32.7 pg (ref 26.0–34.0)
MCHC: 34.8 g/dL (ref 30.0–36.0)
MCV: 94 fL (ref 80.0–100.0)
Platelets: 99 10*3/uL — ABNORMAL LOW (ref 150–400)
RBC: 3 MIL/uL — ABNORMAL LOW (ref 4.22–5.81)
RDW: 13.8 % (ref 11.5–15.5)
WBC: 7.4 10*3/uL (ref 4.0–10.5)
nRBC: 0 % (ref 0.0–0.2)

## 2020-09-24 LAB — POTASSIUM: Potassium: 4 mmol/L (ref 3.5–5.1)

## 2020-09-24 LAB — PROTIME-INR
INR: 1.6 — ABNORMAL HIGH (ref 0.8–1.2)
Prothrombin Time: 19.1 seconds — ABNORMAL HIGH (ref 11.4–15.2)

## 2020-09-24 MED ORDER — AMIODARONE HCL IN DEXTROSE 360-4.14 MG/200ML-% IV SOLN
30.0000 mg/h | INTRAVENOUS | Status: AC
  Administered 2020-09-24 – 2020-09-25 (×2): 30 mg/h via INTRAVENOUS
  Filled 2020-09-24: qty 200

## 2020-09-24 MED ORDER — AMIODARONE IV BOLUS ONLY 150 MG/100ML: 150.0000 mg | Freq: Once | INTRAVENOUS | Status: DC

## 2020-09-24 MED ORDER — METOCLOPRAMIDE HCL 5 MG/ML IJ SOLN
10.0000 mg | Freq: Four times a day (QID) | INTRAMUSCULAR | Status: AC
  Administered 2020-09-24 – 2020-09-25 (×6): 10 mg via INTRAVENOUS
  Filled 2020-09-24 (×6): qty 2

## 2020-09-24 MED ORDER — AMIODARONE LOAD VIA INFUSION
150.0000 mg | Freq: Once | INTRAVENOUS | Status: AC
  Administered 2020-09-24: 150 mg via INTRAVENOUS
  Filled 2020-09-24: qty 83.34

## 2020-09-24 MED ORDER — AMIODARONE HCL IN DEXTROSE 360-4.14 MG/200ML-% IV SOLN
60.0000 mg/h | INTRAVENOUS | Status: AC
  Administered 2020-09-24 (×2): 60 mg/h via INTRAVENOUS
  Filled 2020-09-24 (×3): qty 200

## 2020-09-24 MED ORDER — LEVOTHYROXINE SODIUM 75 MCG PO TABS
75.0000 ug | ORAL_TABLET | Freq: Every day | ORAL | Status: DC
  Administered 2020-09-25 – 2020-09-29 (×5): 75 ug via ORAL
  Filled 2020-09-24 (×5): qty 1

## 2020-09-24 MED ORDER — DILTIAZEM HCL 25 MG/5ML IV SOLN
5.0000 mg | Freq: Four times a day (QID) | INTRAVENOUS | Status: DC | PRN
  Administered 2020-09-24: 5 mg via INTRAVENOUS
  Filled 2020-09-24 (×2): qty 5

## 2020-09-24 MED ORDER — ~~LOC~~ CARDIAC SURGERY, PATIENT & FAMILY EDUCATION: Freq: Once | Status: AC

## 2020-09-24 MED ORDER — FUROSEMIDE 10 MG/ML IJ SOLN
20.0000 mg | Freq: Four times a day (QID) | INTRAMUSCULAR | Status: AC
  Administered 2020-09-24 (×3): 20 mg via INTRAVENOUS
  Filled 2020-09-24 (×3): qty 2

## 2020-09-24 MED ORDER — POTASSIUM CHLORIDE CRYS ER 20 MEQ PO TBCR
20.0000 meq | EXTENDED_RELEASE_TABLET | Freq: Two times a day (BID) | ORAL | Status: DC
  Administered 2020-09-25 – 2020-09-26 (×3): 20 meq via ORAL
  Filled 2020-09-24 (×2): qty 1

## 2020-09-24 MED ORDER — DILTIAZEM HCL-DEXTROSE 125-5 MG/125ML-% IV SOLN (PREMIX)
5.0000 mg/h | INTRAVENOUS | Status: DC
  Administered 2020-09-24: 10 mg/h via INTRAVENOUS
  Administered 2020-09-25: 12 mg/h via INTRAVENOUS
  Filled 2020-09-24 (×3): qty 125

## 2020-09-24 MED ORDER — FUROSEMIDE 40 MG PO TABS
40.0000 mg | ORAL_TABLET | Freq: Two times a day (BID) | ORAL | Status: DC
  Administered 2020-09-25 – 2020-09-26 (×3): 40 mg via ORAL
  Filled 2020-09-24 (×3): qty 1

## 2020-09-24 MED FILL — Heparin Sodium (Porcine) Inj 1000 Unit/ML: INTRAMUSCULAR | Qty: 10 | Status: AC

## 2020-09-24 MED FILL — Sodium Chloride IV Soln 0.9%: INTRAVENOUS | Qty: 5000 | Status: AC

## 2020-09-24 MED FILL — Heparin Sodium (Porcine) Inj 1000 Unit/ML: INTRAMUSCULAR | Qty: 30 | Status: AC

## 2020-09-24 MED FILL — Electrolyte-R (PH 7.4) Solution: INTRAVENOUS | Qty: 5000 | Status: AC

## 2020-09-24 MED FILL — Potassium Chloride Inj 2 mEq/ML: INTRAVENOUS | Qty: 40 | Status: AC

## 2020-09-24 MED FILL — Lidocaine HCl Local Preservative Free (PF) Inj 2%: INTRAMUSCULAR | Qty: 15 | Status: AC

## 2020-09-24 MED FILL — Sodium Bicarbonate IV Soln 8.4%: INTRAVENOUS | Qty: 50 | Status: AC

## 2020-09-24 NOTE — Progress Notes (Signed)
Spoke with Dr. Dorris Fetch regarding Pt.s afib.  Cardizem gtt ordered at this time for afib s/p one dose of cardizem bolus of 5mg  for HR of 120.

## 2020-09-24 NOTE — Discharge Summary (Signed)
[address]       [place] [address]             [phone]    Physician Discharge Summary  Patient ID: Chad Berry MRN: [id number] DOB/AGE: 1940-10-24 80 y.o.  Admit date: 09/22/2020 Discharge date: 09/29/2020  Admission Diagnoses:  Patient Active Problem List   Diagnosis Date Noted  . History of diverticulitis 06/06/2019  . Prolapsed internal hemorrhoids, grade 3 02/14/2017  . Palpitation 06/09/2015  . Mitral regurgitation 06/09/2015  . 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 Pain in both feet 08/03/2014  . 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 Carotid stenosis. bil 39% 09/25/2013  . Occlusion and stenosis of unspecified carotid artery 09/25/2013  . Rapid palpitations   . Mitral valve prolapse 12/23/2012  . Essential hypertension 12/23/2012  . Hyperlipidemia 12/23/2012   Discharge Diagnoses:  Patient Active Problem List   Diagnosis Date Noted  . 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 History of diverticulitis 06/06/2019  . Prolapsed internal hemorrhoids, grade 3 02/14/2017  . Palpitation 06/09/2015  . Mitral regurgitation 06/09/2015  . 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 Pain in both feet 08/03/2014  . 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 Carotid stenosis. bil 39% 09/25/2013  . Occlusion and stenosis of unspecified carotid artery 09/25/2013  . Rapid palpitations   . Mitral valve prolapse 12/23/2012  . Essential hypertension 12/23/2012  . Hyperlipidemia 12/23/2012   Discharged Condition: good  History of Present Illness:  Patient is 80 year old moderately overweight male with history of mitral valve prolapse, mitral regurgitation, hypertension, hyperlipidemia, hypothyroidism, and asymptomatic cerebrovascular disease who has been referred for surgical consultation to discuss treatment options for management of mitral valve  prolapse with severe mitral regurgitation.   Patient has known of presence of heart murmur and mitral valve prolapse for more than 20 years.  He has been followed up for many years by Dr. Allyson Sabal.  Follow-up echocardiogram performed December 2020 revealed normal left ventricular systolic function with mitral valve prolapse and what was felt to be moderate mitral regurgitation.  The patient is always remained active physically and without significant physical limitations.  Recent echocardiogram performed March 10, 2020 revealed normal left ventricular size and systolic function with mitral valve prolapse and severe mitral regurgitation.  There was mild left atrial enlargement.  The patient subsequently underwent transesophageal echocardiogram May 04, 2020.  TEE revealed mitral valve prolapse with an obvious flail segment involving the middle scallop (P2) of the posterior leaflet causing severe mitral regurgitation.  Left ventricular size and systolic function remain normal.  There was mild left atrial enlargement.  No other significant abnormalities were noted.  Cardiothoracic surgical consultation was requested.  He was evaluated by Dr. Cornelius Moras who felt the patient would benefit from surgical intervention.  The risks and benefits of the procedure were explained to the patient and he was agreeable to proceed.  Hospital Course:  [name] presented to [facility] on 09/22/2020.  He underwent Minimally Invasive Mitral Valve Repair with a 28 mm Simuform ring and placement of Neo-chords x 10.  He tolerated the procedure without difficulty, was extubated, and was taken to the SICU in stable condition.  The patient required Neo-Synephrine post operatively.  This was weaned as hemodynamics allowed.  He was started on coumadin therapy for his MV Repair.  He was treated with Toradol for increased pain.  He was started on IV lasix for  hypervolemic state.  He developed a slight rise in his creatinine up to 1.15  but this did improve.  This was felt to be related to his Toradol use.  He was restarted on Levothryoxine for his hypothyroidism. He was maintaining NSR and his pacing wires were removed without difficulty.  He was felt stable for transfer to the progressive care unit on 09/26/2020.  His chest tube output decreased and his chest tubes were removed on 09/26/2020.  Follow up CXR showed improvement with decreased right basilar atelectasis and pleural effusions.  He developed Atrial Fibrillation.  He was treated with Amiodarone and Cardizem to convert to NSR.  His Cardizem was discontinued prior to discharge.  He was started on Lopressor for additional HR control.  He was hypertensive and restarted on his home Cozaar at a reduced dose. Epicardial pacing wires were removed on 05/30. He remains on coumadin. His INR went up to 2.7 so Coumadin was held a few days. It was restarted at 1 mg on 05/30. The most recent INR of 1.7.  He will be discharged home on 2 mg of coumadin.  He is ambulating independently.  His surgical incisions are healing without evidence of infection.  He is medically stable for discharge home today.    Significant Diagnostic Studies: cardiac graphics:   Echocardiogram:   IMPRESSIONS    1. Flail P2 segment of posterior leaflet of mitral valve causing  eccentric anterior directed MR jet. Severe mitral valve regurgitation. 3D  vena contracta area 0.6 cm^2, consistent with severe MR.  2. Left ventricular ejection fraction, by estimation, is 60 to 65%. The  left ventricle has normal function.  3. Right ventricular systolic function is normal. The right ventricular  size is mildly enlarged.  4. Left atrial size was mildly dilated. No left atrial/left atrial  appendage thrombus was detected.  5. The aortic valve is tricuspid. Aortic valve regurgitation is trivial.  No aortic stenosis is present.  6. Aortic dilatation noted. There is mild dilatation of the ascending  aorta, measuring  38 mm.   Treatments: surgery:   Date of Procedure:                09/22/2020  Preoperative Diagnosis:      Severe Mitral Regurgitation  Postoperative Diagnosis:    Same  Procedure:        Minimally-Invasive Mitral Valve Repair             Complex valvuloplasty including artificial PTFE neochord placement x10             Medtronic Simuform Ring Annuloplasty (size 95mm, catalog # [id number], serial # [id number])               Surgeon:        Salvatore Decent. Cornelius Moras, MD  Assistant:       Lowella Dandy, PA-C   Discharge Exam: Blood pressure (!) 153/85, pulse 84, temperature (!) 96 F (35.6 C), temperature source Axillary, resp. rate 18, height 5\' 11"  (1.803 m), weight 98.2 kg, SpO2 94 %.  General appearance: alert, cooperative and no distress Heart: regular rate and rhythm Lungs: clear to auscultation bilaterally Abdomen: soft, non-tender; bowel sounds normal; no masses,  no organomegaly Extremities: edema trace Wound: clean and dry  Discharge Medications:  The patient has been discharged on:   1.Beta Blocker:  Yes [ X  ]  No   [   ]                              If No, reason:  2.Ace Inhibitor/ARB: Yes [ X  ]                                     No  [    ]                                     If No, reason:  3.Statin:   Yes [ X  ]                  No  [   ]                  If No, reason:  4.Ecasa:  Yes  [ X  ]                  No   [   ]                  If No, reason:    Discharge Instructions    Amb Referral to Cardiac Rehabilitation   Complete by: As directed    [facility]   Diagnosis: Valve Repair   Valve: Mitral   After initial evaluation and assessments completed: Virtual Based Care may be provided alone or in conjunction with Phase 2 Cardiac Rehab based on patient barriers.: Yes     Allergies as of 09/29/2020      Reactions   Contrast Media [iodinated Diagnostic Agents] Hives, Nausea And Vomiting, Cough    Pt will need a 13 hour prep before any contrast per Dr. Paulina FusiMark Shogry (05/14/19)   Hctz [hydrochlorothiazide] Other (See Comments)   palpitations   Metoprolol Succinate [metoprolol] Other (See Comments)   Bradycardia      Medication List    STOP taking these medications   ibuprofen 200 MG tablet Commonly known as: ADVIL     TAKE these medications   amiodarone 200 MG tablet Commonly known as: PACERONE Take 1 tablet (200 mg total) by mouth 2 (two) times daily. X 7 days, then decrease to 200 mg daily   aspirin 81 MG EC tablet Take 1 tablet (81 mg total) by mouth daily. Swallow whole.   ezetimibe 10 MG tablet Commonly known as: ZETIA Take 10 mg by mouth at bedtime.   fluticasone 50 MCG/ACT nasal spray Commonly known as: FLONASE Place 1 spray into both nostrils at bedtime.   furosemide 40 MG tablet Commonly known as: LASIX Take 1 tablet (40 mg total) by mouth daily.   levothyroxine 75 MCG tablet Commonly known as: SYNTHROID Take 75 mcg by mouth at bedtime.   losartan 25 MG tablet Commonly known as: Cozaar Take 1 tablet (25 mg total) by mouth daily. What changed:   medication strength  how much to take   metoprolol tartrate 25 MG tablet Commonly known as: LOPRESSOR Take 0.5 tablets (12.5 mg total) by mouth 2 (two) times daily.   oxyCODONE 5 MG immediate release tablet Commonly known as: Oxy IR/ROXICODONE Take 1 tablet (5 mg total) by mouth every 4 (four) hours as needed for severe pain.  potassium chloride SA 20 MEQ tablet Commonly known as: KLOR-CON Take 1 tablet (20 mEq total) by mouth daily.   rosuvastatin 40 MG tablet Commonly known as: CRESTOR Take 40 mg by mouth at bedtime.   sodium chloride 0.65 % Soln nasal spray Commonly known as: OCEAN Place 1 spray into both nostrils as needed for congestion.   vitamin C 1000 MG tablet Take 1,000 mg by mouth daily.   Vitamin D 50 MCG (2000 UT) tablet Take 2,000 Units by mouth daily.   warfarin 2 MG  tablet Commonly known as: COUMADIN Take 1 tablet (2 mg total) by mouth daily at 4 PM.            Durable Medical Equipment  (From admission, onward)         Start     Ordered   09/28/20 1400  For home use only DME Walker rolling  Once       Question Answer Comment  Walker: With 5 Inch Wheels   Patient needs a walker to treat with the following condition Physical deconditioning      09/28/20 1359          Follow-up Information    [facility] Follow up on 09/30/2020.   Why: Appointment is at 10:30 for PT/INR Check Contact information: [facility] [address] [place] [address]       [facility] Follow up on 10/11/2020.   Specialty: Cardiothoracic Surgery Why: Appointment is at 1:00, please get CXR at 12:30 at [facility] located on first floor of our office building Contact information: [address] [place], [address] [place] [address] [phone]       [facility]alee Course, [place] Follow up on 10/15/2020.   Specialties: Cardiology, Radiology Why: Appointment is at 11:15 Contact information: [address] [place] [address] [phone]               Signed:  Lowella Dandy, PA-C 09/29/2020, 8:26 AM

## 2020-09-24 NOTE — Progress Notes (Signed)
      [address]       [name] [address]             [phone]      POD # 2 Mitral repair  Went into A fib with RVR today  Started on amiodarone drip  Currently rate 115-130   Was in flutter initially, did not respond to rapid AP  BP 125/64   Pulse (!) 114   Temp 98 F (36.7 C) (Oral)   Resp (!) 21   Ht 5\' 11"  (1.803 m)   Wt 108.2 kg   SpO2 98%   BMI 33.27 kg/m   Intake/Output Summary (Last 24 hours) at 09/24/2020 1732 Last data filed at 09/24/2020 1700 Gross per 24 hour  Intake 852.07 ml  Output 2130 ml  Net -1277.93 ml   K= 4.0 CBG well controlled  09/26/2020 C. Viviann Spare, MD [facility] [phone]

## 2020-09-24 NOTE — Progress Notes (Addendum)
[address]       [name] [address]             [phone]      2 Days Post-Op Procedure(s) (LRB): MINIMALLY INVASIVE MITRAL VALVE REPAIR (MVR) USING MEDTRONIC SIMUFORM SEMI-RIGID ANNULOPLASTY RING SIZE ON PUMP (Right) TRANSESOPHAGEAL ECHOCARDIOGRAM (TEE) (N/A)   Subjective:  Up in chair, pain improved with Toradol.    Objective: Vital signs in last 24 hours: Temp:  [97.9 F (36.6 C)-100.04 F (37.8 C)] 97.9 F (36.6 C) (05/27 0700) Pulse Rate:  [71-92] 81 (05/27 0700) Cardiac Rhythm: Normal sinus rhythm (05/27 0400) Resp:  [8-28] 25 (05/27 0700) BP: (92-141)/(48-80) 121/63 (05/27 0700) SpO2:  [91 %-99 %] 95 % (05/27 0700) Arterial Line BP: (89-160)/(42-80) 138/57 (05/27 0700) Weight:  [108.2 kg] 108.2 kg (05/27 0500)  Hemodynamic parameters for last 24 hours: PAP: (43-48)/(11-14) 48/14  Intake/Output from previous day: 05/26 0701 - 05/27 0700 In: 1058.7 [P.O.:100; I.V.:658.6; IV Piggyback:300.2] Out: 1880 [Urine:1180; Chest Tube:700]  General appearance: alert, cooperative and no distress Heart: regular rate and rhythm Lungs: clear to auscultation bilaterally Abdomen: soft, non-tender; bowel sounds normal; no masses,  no organomegaly Extremities: edema trace Wound: clean and dry  Lab Results: Recent Labs    09/23/20 1831 09/24/20 0311  WBC 7.6 7.4  HGB 10.2* 9.8*  HCT 29.3* 28.2*  PLT 101* 99*   BMET:  Recent Labs    09/23/20 1831 09/24/20 0311  NA 132* 133*  K 4.1 4.0  CL 104 104  CO2 22 25  GLUCOSE 130* 117*  BUN 15 17  CREATININE 1.08 1.15  CALCIUM 7.4* 7.4*    PT/INR:  Recent Labs    09/24/20 0311  LABPROT 19.1*  INR 1.6*   ABG    Component Value Date/Time   PHART 7.371 09/22/2020 2309   HCO3 19.1 (L) 09/22/2020 2309   TCO2 20 (L) 09/22/2020 2309   ACIDBASEDEF 5.0 (H) 09/22/2020 2309   O2SAT 99.0 09/22/2020 2309   CBG (last 3)  Recent Labs    09/24/20 0045 09/24/20 0315 09/24/20 0659  GLUCAP  125* 114* 124*    Assessment/Plan: S/P Procedure(s) (LRB): MINIMALLY INVASIVE MITRAL VALVE REPAIR (MVR) USING MEDTRONIC SIMUFORM SEMI-RIGID ANNULOPLASTY RING SIZE ON PUMP (Right) TRANSESOPHAGEAL ECHOCARDIOGRAM (TEE) (N/A)  1. CV- NSR- remains on Neo-synephrine, weaning as tolerated, INR 1.6 2. Pulm- no acute issues, continue pulmonary toilet CXR with atelectasis, CT output 700 cc leave in place today 3. Renal- creatinine slightly increased to 1.15, suspect due to diuretics and Toradol use, K is at 4.0 4. Expected post operative blood loss anemia, mild Hgb at 9.8 5. Expected post operative thrombocytopenia- stable 6. CBGs controlled 7. Dispo- patient stable, wean Neo as BP allows, continue low dose coumadin, CT output remains elevated leave chest tubes in place today, watch creatinine, suspect elevation is due to toradol use, continue current care   LOS: 2 days    Lowella Dandy, PA-C 09/24/2020   I have seen and examined the patient and agree with the assessment and plan as outlined.  Doing well.  Maintaining NSR w/ stable BP on minimal dose Neo.  Breathing comfortably w/ O2 sats 96% on 2 L/min.  Ambulated reasonably well this morning but felt "fatigued".  Minimal pain.  Complains of nausea.  No abdominal pain. Mobilize.  Diuresis. D/C lines.  Leave chest tubes in until output decreases further.  Transfer 4E once stable off Neo  Purcell Nails, MD  09/24/2020 8:25 AM

## 2020-09-25 LAB — BASIC METABOLIC PANEL
Anion gap: 10 (ref 5–15)
BUN: 13 mg/dL (ref 8–23)
CO2: 25 mmol/L (ref 22–32)
Calcium: 8.1 mg/dL — ABNORMAL LOW (ref 8.9–10.3)
Chloride: 100 mmol/L (ref 98–111)
Creatinine, Ser: 1.09 mg/dL (ref 0.61–1.24)
GFR, Estimated: >60 mL/min (ref >60)
Glucose, Bld: 136 mg/dL — ABNORMAL HIGH (ref 70–99)
Potassium: 3.7 mmol/L (ref 3.5–5.1)
Sodium: 135 mmol/L (ref 135–145)

## 2020-09-25 LAB — PROTIME-INR
INR: 2.4 — ABNORMAL HIGH (ref 0.8–1.2)
Prothrombin Time: 26.2 seconds — ABNORMAL HIGH (ref 11.4–15.2)

## 2020-09-25 LAB — CBC
HCT: 29.3 % — ABNORMAL LOW (ref 39.0–52.0)
Hemoglobin: 10.4 g/dL — ABNORMAL LOW (ref 13.0–17.0)
MCH: 32.8 pg (ref 26.0–34.0)
MCHC: 35.5 g/dL (ref 30.0–36.0)
MCV: 92.4 fL (ref 80.0–100.0)
Platelets: 109 10*3/uL — ABNORMAL LOW (ref 150–400)
RBC: 3.17 MIL/uL — ABNORMAL LOW (ref 4.22–5.81)
RDW: 13 % (ref 11.5–15.5)
WBC: 7.7 10*3/uL (ref 4.0–10.5)
nRBC: 0 % (ref 0.0–0.2)

## 2020-09-25 LAB — MAGNESIUM: Magnesium: 2 mg/dL (ref 1.7–2.4)

## 2020-09-25 MED ORDER — DILTIAZEM HCL ER COATED BEADS 120 MG PO CP24
120.0000 mg | ORAL_CAPSULE | Freq: Every day | ORAL | Status: DC
  Administered 2020-09-25 – 2020-09-28 (×4): 120 mg via ORAL
  Filled 2020-09-25 (×4): qty 1

## 2020-09-25 MED ORDER — WARFARIN SODIUM 1 MG PO TABS: 1.0000 mg | ORAL_TABLET | Freq: Every day | ORAL | Status: DC

## 2020-09-25 MED ORDER — AMIODARONE HCL 200 MG PO TABS
400.0000 mg | ORAL_TABLET | Freq: Two times a day (BID) | ORAL | Status: DC
  Administered 2020-09-25 – 2020-09-28 (×7): 400 mg via ORAL
  Filled 2020-09-25 (×7): qty 2

## 2020-09-25 NOTE — Progress Notes (Signed)
      [address]       [place] [address]             [phone]    Feels better   BP 129/64   Pulse 74   Temp 98.3 F (36.8 C)   Resp 18   Ht 5\' 11"  (1.803 m)   Wt 106.1 kg   SpO2 96%   BMI 32.62 kg/m    Intake/Output Summary (Last 24 hours) at 09/25/2020 1807 Last data filed at 09/25/2020 1600 Gross per 24 hour  Intake 970.92 ml  Output 2755 ml  Net -1784.08 ml   Diuresing well, moderate CT output  09/27/2020 C. Viviann Spare, MD [facility] [phone]

## 2020-09-25 NOTE — Progress Notes (Signed)
3 Days Post-Op Procedure(s) (LRB): MINIMALLY INVASIVE MITRAL VALVE REPAIR (MVR) USING MEDTRONIC SIMUFORM SEMI-RIGID ANNULOPLASTY RING SIZE ON PUMP (Right) TRANSESOPHAGEAL ECHOCARDIOGRAM (TEE) (N/A) Subjective: Feels better this Am, some pain in chest  Objective: Vital signs in last 24 hours: Temp:  [97.8 F (36.6 C)-98.6 F (37 C)] 98.6 F (37 C) (05/28 0700) Pulse Rate:  [65-137] 81 (05/28 0800) Cardiac Rhythm: Normal sinus rhythm (05/28 0800) Resp:  [9-29] 18 (05/28 0800) BP: (99-150)/(58-100) 139/89 (05/28 0800) SpO2:  [89 %-98 %] 91 % (05/28 0800) Arterial Line BP: (113-175)/(46-77) 135/60 (05/27 1230) Weight:  [106.1 kg] 106.1 kg (05/28 0600)  Hemodynamic parameters for last 24 hours:    Intake/Output from previous day: 05/27 0701 - 05/28 0700 In: 1044.1 [P.O.:360; I.V.:684.1] Out: 3510 [Urine:3200; Chest Tube:310] Intake/Output this shift: Total I/O In: 23.5 [I.V.:23.5] Out: -   General appearance: alert, cooperative and no distress Neurologic: intact Heart: regular rate and rhythm Lungs: diminished breath sounds right base Abdomen: normal findings: soft, non-tender  Lab Results: Recent Labs    09/23/20 1831 09/24/20 0311  WBC 7.6 7.4  HGB 10.2* 9.8*  HCT 29.3* 28.2*  PLT 101* 99*   BMET:  Recent Labs    09/23/20 1831 09/24/20 0311 09/24/20 1533  NA 132* 133*  --   K 4.1 4.0 4.0  CL 104 104  --   CO2 22 25  --   GLUCOSE 130* 117*  --   BUN 15 17  --   CREATININE 1.08 1.15  --   CALCIUM 7.4* 7.4*  --     PT/INR:  Recent Labs    09/25/20 0444  LABPROT 26.2*  INR 2.4*   ABG    Component Value Date/Time   PHART 7.371 09/22/2020 2309   HCO3 19.1 (L) 09/22/2020 2309   TCO2 20 (L) 09/22/2020 2309   ACIDBASEDEF 5.0 (H) 09/22/2020 2309   O2SAT 99.0 09/22/2020 2309   CBG (last 3)  Recent Labs    09/24/20 0315 09/24/20 0659 09/24/20 1102  GLUCAP 114* 124* 127*    Assessment/Plan: S/P Procedure(s) (LRB): MINIMALLY INVASIVE  MITRAL VALVE REPAIR (MVR) USING MEDTRONIC SIMUFORM SEMI-RIGID ANNULOPLASTY RING SIZE ON PUMP (Right) TRANSESOPHAGEAL ECHOCARDIOGRAM (TEE) (N/A) POD # 3  CV- back in SR this AM, after being in AF with RVR yesterday  Change amiodarone to PO, start PO diltiazem  ASA, Coumadin- INR up to 2.4, hold coumadin today, restart at 1 mg tomorrow RESP_ IS for atelectasis, Ct with moderate drainage- leave in today RENAL- creatinine and lytes OK  Continue diuretics ENDO- CBG well controlled Ambulate   LOS: 3 days    Loreli Slot 09/25/2020

## 2020-09-26 ENCOUNTER — Inpatient Hospital Stay (HOSPITAL_COMMUNITY): Payer: 59

## 2020-09-26 LAB — CBC
HCT: 29 % — ABNORMAL LOW (ref 39.0–52.0)
Hemoglobin: 10.4 g/dL — ABNORMAL LOW (ref 13.0–17.0)
MCH: 32.6 pg (ref 26.0–34.0)
MCHC: 35.9 g/dL (ref 30.0–36.0)
MCV: 90.9 fL (ref 80.0–100.0)
Platelets: 120 10*3/uL — ABNORMAL LOW (ref 150–400)
RBC: 3.19 MIL/uL — ABNORMAL LOW (ref 4.22–5.81)
RDW: 13 % (ref 11.5–15.5)
WBC: 6.6 10*3/uL (ref 4.0–10.5)
nRBC: 0 % (ref 0.0–0.2)

## 2020-09-26 LAB — BASIC METABOLIC PANEL
Anion gap: 8 (ref 5–15)
BUN: 8 mg/dL (ref 8–23)
CO2: 27 mmol/L (ref 22–32)
Calcium: 7.8 mg/dL — ABNORMAL LOW (ref 8.9–10.3)
Chloride: 100 mmol/L (ref 98–111)
Creatinine, Ser: 1.07 mg/dL (ref 0.61–1.24)
GFR, Estimated: >60 mL/min (ref >60)
Glucose, Bld: 108 mg/dL — ABNORMAL HIGH (ref 70–99)
Potassium: 3.7 mmol/L (ref 3.5–5.1)
Sodium: 135 mmol/L (ref 135–145)

## 2020-09-26 LAB — PROTIME-INR
INR: 2.7 — ABNORMAL HIGH (ref 0.8–1.2)
Prothrombin Time: 29 seconds — ABNORMAL HIGH (ref 11.4–15.2)

## 2020-09-26 MED ORDER — FUROSEMIDE 40 MG PO TABS
40.0000 mg | ORAL_TABLET | Freq: Every day | ORAL | Status: DC
  Administered 2020-09-27 – 2020-09-29 (×3): 40 mg via ORAL
  Filled 2020-09-26 (×3): qty 1

## 2020-09-26 MED ORDER — ~~LOC~~ CARDIAC SURGERY, PATIENT & FAMILY EDUCATION: Freq: Once | Status: DC

## 2020-09-26 MED ORDER — ALUM & MAG HYDROXIDE-SIMETH 200-200-20 MG/5ML PO SUSP: 15.0000 mL | Freq: Four times a day (QID) | ORAL | Status: DC | PRN

## 2020-09-26 MED ORDER — POTASSIUM CHLORIDE CRYS ER 20 MEQ PO TBCR
20.0000 meq | EXTENDED_RELEASE_TABLET | ORAL | Status: DC
  Administered 2020-09-26: 20 meq via ORAL
  Filled 2020-09-26 (×2): qty 1

## 2020-09-26 MED ORDER — POTASSIUM CHLORIDE CRYS ER 20 MEQ PO TBCR
20.0000 meq | EXTENDED_RELEASE_TABLET | Freq: Two times a day (BID) | ORAL | Status: DC
  Administered 2020-09-27 (×2): 20 meq via ORAL
  Filled 2020-09-26 (×2): qty 1

## 2020-09-26 MED ORDER — MAGNESIUM HYDROXIDE 400 MG/5ML PO SUSP: 30.0000 mL | Freq: Every day | ORAL | Status: DC | PRN

## 2020-09-26 MED ORDER — SODIUM CHLORIDE 0.9 % IV SOLN: 250.0000 mL | INTRAVENOUS | Status: DC | PRN

## 2020-09-26 MED ORDER — WARFARIN SODIUM 1 MG PO TABS
1.0000 mg | ORAL_TABLET | Freq: Every day | ORAL | Status: DC
  Administered 2020-09-27 – 2020-09-28 (×2): 1 mg via ORAL
  Filled 2020-09-26 (×2): qty 1

## 2020-09-26 MED ORDER — SODIUM CHLORIDE 0.9% FLUSH
3.0000 mL | Freq: Two times a day (BID) | INTRAVENOUS | Status: DC
  Administered 2020-09-26 – 2020-09-28 (×5): 3 mL via INTRAVENOUS

## 2020-09-26 MED ORDER — SODIUM CHLORIDE 0.9% FLUSH: 3.0000 mL | INTRAVENOUS | Status: DC | PRN

## 2020-09-26 NOTE — Progress Notes (Signed)
Courtenay RN aware of order to d/c CVC. 

## 2020-09-26 NOTE — Progress Notes (Signed)
4 Days Post-Op Procedure(s) (LRB): MINIMALLY INVASIVE MITRAL VALVE REPAIR (MVR) USING MEDTRONIC SIMUFORM SEMI-RIGID ANNULOPLASTY RING SIZE ON PUMP (Right) TRANSESOPHAGEAL ECHOCARDIOGRAM (TEE) (N/A) Subjective: Feels much better this AM  Objective: Vital signs in last 24 hours: Temp:  [97.8 F (36.6 C)-98.7 F (37.1 C)] 97.8 F (36.6 C) (05/29 0700) Pulse Rate:  [72-93] 79 (05/29 0600) Cardiac Rhythm: Normal sinus rhythm (05/29 0400) Resp:  [15-27] 19 (05/29 0600) BP: (124-158)/(62-79) 140/79 (05/29 0600) SpO2:  [92 %-98 %] 94 % (05/29 0600) Weight:  [103 kg] 103 kg (05/29 0500)  Hemodynamic parameters for last 24 hours:    Intake/Output from previous day: 05/28 0701 - 05/29 0700 In: 818.4 [P.O.:720; I.V.:98.4] Out: 3330 [Urine:3100; Chest Tube:230] Intake/Output this shift: No intake/output data recorded.  General appearance: alert, cooperative and no distress Neurologic: intact Heart: regular rate and rhythm Lungs: diminished breath sounds bibasilar Wound: clean and dry  Lab Results: Recent Labs    09/25/20 1054 09/26/20 0102  WBC 7.7 6.6  HGB 10.4* 10.4*  HCT 29.3* 29.0*  PLT 109* 120*   BMET:  Recent Labs    09/25/20 1054 09/26/20 0102  NA 135 135  K 3.7 3.7  CL 100 100  CO2 25 27  GLUCOSE 136* 108*  BUN 13 8  CREATININE 1.09 1.07  CALCIUM 8.1* 7.8*    PT/INR:  Recent Labs    09/26/20 0102  LABPROT 29.0*  INR 2.7*   ABG    Component Value Date/Time   PHART 7.371 09/22/2020 2309   HCO3 19.1 (L) 09/22/2020 2309   TCO2 20 (L) 09/22/2020 2309   ACIDBASEDEF 5.0 (H) 09/22/2020 2309   O2SAT 99.0 09/22/2020 2309   CBG (last 3)  Recent Labs    09/24/20 0315 09/24/20 0659 09/24/20 1102  GLUCAP 114* 124* 127*    Assessment/Plan: S/P Procedure(s) (LRB): MINIMALLY INVASIVE MITRAL VALVE REPAIR (MVR) USING MEDTRONIC SIMUFORM SEMI-RIGID ANNULOPLASTY RING SIZE ON PUMP (Right) TRANSESOPHAGEAL ECHOCARDIOGRAM (TEE) (N/A) Plan for  transfer to step-down: see transfer orders  Looks much better this morning CV- in SR on amiodarone and PO diltiazem  INR 2.9, hold coumadin again today RESP_ continue IS for atelectasis RENAL- creatinine normal  Diuresed well yesterday- decrease Lasix to once daily ENDO- no issues Gi- tolerating PO Dc chest tubes and central line   LOS: 4 days    Loreli Slot 09/26/2020

## 2020-09-26 NOTE — Progress Notes (Signed)
Pt arrived to unit from 2heart  VSS, A/O x 4,  CCMD called ,CHG given, pt oriented to unit,Will continue to monitor.   Everlean Cherry, RN

## 2020-09-26 NOTE — Progress Notes (Addendum)
Mobility Specialist - Progress Note   09/26/20 1459  Mobility  Activity Ambulated in hall  Level of Assistance Standby assist, set-up cues, supervision of patient - no hands on  Assistive Device Helmet  Distance Ambulated (ft) 420 ft  Mobility Ambulated with assistance in hallway  Mobility Response Tolerated well  Mobility performed by Mobility specialist  $Mobility charge 1 Mobility   Pre-mobility, 1L O2: 85 HR, 95% SpO2 During mobility, RA : 97 HR, 92% SpO2 Post-mobility, RA: 85 HR, 91% SpO2  Pt asx throughout ambulation. Pt back in bed after walk, call bell at side and family in room. Assistive Device was a RW, not a helmet.  Mamie Levers Mobility Specialist Mobility Specialist Phone: [phone]

## 2020-09-27 ENCOUNTER — Inpatient Hospital Stay (HOSPITAL_COMMUNITY): Payer: 59

## 2020-09-27 LAB — CBC
HCT: 28.7 % — ABNORMAL LOW (ref 39.0–52.0)
Hemoglobin: 10 g/dL — ABNORMAL LOW (ref 13.0–17.0)
MCH: 32.3 pg (ref 26.0–34.0)
MCHC: 34.8 g/dL (ref 30.0–36.0)
MCV: 92.6 fL (ref 80.0–100.0)
Platelets: 142 10*3/uL — ABNORMAL LOW (ref 150–400)
RBC: 3.1 MIL/uL — ABNORMAL LOW (ref 4.22–5.81)
RDW: 13 % (ref 11.5–15.5)
WBC: 5.4 10*3/uL (ref 4.0–10.5)
nRBC: 0 % (ref 0.0–0.2)

## 2020-09-27 LAB — BASIC METABOLIC PANEL
Anion gap: 8 (ref 5–15)
BUN: 8 mg/dL (ref 8–23)
CO2: 30 mmol/L (ref 22–32)
Calcium: 8.3 mg/dL — ABNORMAL LOW (ref 8.9–10.3)
Chloride: 97 mmol/L — ABNORMAL LOW (ref 98–111)
Creatinine, Ser: 1.07 mg/dL (ref 0.61–1.24)
GFR, Estimated: >60 mL/min (ref >60)
Glucose, Bld: 127 mg/dL — ABNORMAL HIGH (ref 70–99)
Potassium: 3.5 mmol/L (ref 3.5–5.1)
Sodium: 135 mmol/L (ref 135–145)

## 2020-09-27 LAB — PROTIME-INR
INR: 2 — ABNORMAL HIGH (ref 0.8–1.2)
Prothrombin Time: 22.6 seconds — ABNORMAL HIGH (ref 11.4–15.2)

## 2020-09-27 MED ORDER — LACTULOSE 10 GM/15ML PO SOLN
20.0000 g | Freq: Once | ORAL | Status: AC
  Administered 2020-09-27: 20 g via ORAL
  Filled 2020-09-27: qty 30

## 2020-09-27 NOTE — Progress Notes (Signed)
Pt pacing wires removed, tolerated well, VSS.  Kalman Jewels, RN 09/27/2020 12:27 PM

## 2020-09-27 NOTE — Progress Notes (Addendum)
      [address]       [place] [address]             [phone]        5 Days Post-Op Procedure(s) (LRB): MINIMALLY INVASIVE MITRAL VALVE REPAIR (MVR) USING MEDTRONIC SIMUFORM SEMI-RIGID ANNULOPLASTY RING SIZE ON PUMP (Right) TRANSESOPHAGEAL ECHOCARDIOGRAM (TEE) (N/A)  Subjective: Patient just finished walking with his wife. He has no specific complaint this am  Objective: Vital signs in last 24 hours: Temp:  [97.6 F (36.4 C)-98.5 F (36.9 C)] 98 F (36.7 C) (05/30 0726) Pulse Rate:  [80-90] 81 (05/30 0726) Cardiac Rhythm: Normal sinus rhythm (05/29 1900) Resp:  [14-20] 18 (05/30 0726) BP: (115-162)/(61-95) 159/84 (05/30 0726) SpO2:  [90 %-97 %] 97 % (05/30 0726)  Pre op weight 101.6 kg Current Weight  09/26/20 103 kg      Intake/Output from previous day: 05/29 0701 - 05/30 0700 In: 120 [P.O.:120] Out: 1200 [Urine:1200]   Physical Exam:  Cardiovascular: RRR, no murmur Pulmonary: Slightly diminished bibasilar breath sounds L>R Abdomen: Soft, non tender, bowel sounds present. Extremities: Trace bilateral lower extremity edema. Wounds: Aquacel removed and wound is clean and dry.  No erythema or signs of infection.  Lab Results: CBC: Recent Labs    09/26/20 0102 09/27/20 0032  WBC 6.6 5.4  HGB 10.4* 10.0*  HCT 29.0* 28.7*  PLT 120* 142*   BMET:  Recent Labs    09/26/20 0102 09/27/20 0032  NA 135 135  K 3.7 3.5  CL 100 97*  CO2 27 30  GLUCOSE 108* 127*  BUN 8 8  CREATININE 1.07 1.07  CALCIUM 7.8* 8.3*    PT/INR:  Lab Results  Component Value Date   INR 2.0 (H) 09/27/2020   INR 2.7 (H) 09/26/2020   INR 2.4 (H) 09/25/2020   ABG:  INR: Will add last result for INR, ABG once components are confirmed Will add last 4 CBG results once components are confirmed  Assessment/Plan:  1. CV - Previous a fib with RVR. This am, SR, hypertensive. On Amiodarone 400 mg bid, Cardizem CD 120 mg daily, Coumadin. INR this am  decreased from 2.7 to 2. Coumadin has been held last few days. Will restart at 1 mg. 2.  Pulmonary - On 1 liter of oxygen via [place]. Wean as able. CXR this am shows no pneumothorax, bibasilar atelectasis, and decreased pleural effusions. Encourage incentive spirometer. 3. Volume Overload - On Lasix 40 mg daily 4.  Expected post op acute blood loss anemia - H and H this am stable at 10 and 28.7 5. Supplement potassium 6. Mild thrombocytopenia-platelets this am up to 142,000 7. History of hypothyroidism-continue Levothyroxine 75 mcg daily 8. Will remove EPW  9. LOC constipation-at patient request 10. Possible discharge 1-2 days  Donielle M ZimmermanPA-C 09/27/2020,8:51 AM Patient seen and examined, agree with above CXR shows improving atelectasis Maintaining SR- dc pacing wires INR down to 2.0 - restart coumadin at 1 mg  Viviann Spare C. Dorris Fetch, MD [facility] [phone]

## 2020-09-27 NOTE — Progress Notes (Signed)
Mobility Specialist: Progress Note   09/27/20 1724  Mobility  Activity Ambulated in hall  Level of Assistance Minimal assist, patient does 75% or more  Assistive Device Front wheel walker  Distance Ambulated (ft) 230 ft  Mobility Ambulated independently in hallway  Mobility Response Tolerated well  Mobility performed by Mobility specialist  $Mobility charge 1 Mobility   Pre-Mobility: 79 HR Post-Mobility: 88 HR, 96% SpO2  Pt asx during ambulation. Pt limited distance due to episode of feeling light headed and dizzy earlier today. Encouraged pt to walk at least one more time today with staff. Pt's wife is present in room.   [facility] Euva Rundell Mobility Specialist Mobility Specialist Phone: [phone]

## 2020-09-28 LAB — PROTIME-INR
INR: 1.8 — ABNORMAL HIGH (ref 0.8–1.2)
Prothrombin Time: 20.7 seconds — ABNORMAL HIGH (ref 11.4–15.2)

## 2020-09-28 LAB — GLUCOSE, CAPILLARY: Glucose-Capillary: 157 mg/dL — ABNORMAL HIGH (ref 70–99)

## 2020-09-28 MED ORDER — METOPROLOL TARTRATE 12.5 MG HALF TABLET
12.5000 mg | ORAL_TABLET | Freq: Two times a day (BID) | ORAL | Status: DC
  Administered 2020-09-28 – 2020-09-29 (×3): 12.5 mg via ORAL
  Filled 2020-09-28 (×3): qty 1

## 2020-09-28 MED ORDER — POTASSIUM CHLORIDE CRYS ER 20 MEQ PO TBCR
40.0000 meq | EXTENDED_RELEASE_TABLET | Freq: Two times a day (BID) | ORAL | Status: DC
  Administered 2020-09-28 – 2020-09-29 (×3): 40 meq via ORAL
  Filled 2020-09-28 (×3): qty 2

## 2020-09-28 MED ORDER — AMIODARONE HCL 200 MG PO TABS
200.0000 mg | ORAL_TABLET | Freq: Two times a day (BID) | ORAL | Status: DC
  Administered 2020-09-28 – 2020-09-29 (×2): 200 mg via ORAL
  Filled 2020-09-28 (×2): qty 1

## 2020-09-28 NOTE — Progress Notes (Addendum)
      [address]       [place] [address]             [phone]      6 Days Post-Op Procedure(s) (LRB): MINIMALLY INVASIVE MITRAL VALVE REPAIR (MVR) USING MEDTRONIC SIMUFORM SEMI-RIGID ANNULOPLASTY RING SIZE ON PUMP (Right) TRANSESOPHAGEAL ECHOCARDIOGRAM (TEE) (N/A)   Subjective:   Patient states he feels off this morning.  When asked specifically what is bothering him he states he doesn't know, just doesn't feel good.  He states he felt good the past 2 days and now he doesn't.  He does have some discomfort along his left side and shoulder.  He denies N/V.  States he isn't sleeping.  Objective: Vital signs in last 24 hours: Temp:  [98.1 F (36.7 C)-98.6 F (37 C)] 98.4 F (36.9 C) (05/31 0308) Pulse Rate:  [78-90] 79 (05/31 0308) Cardiac Rhythm: Normal sinus rhythm (05/30 1900) Resp:  [16-18] 16 (05/31 0308) BP: (123-152)/(77-90) 148/80 (05/31 0308) SpO2:  [92 %-98 %] 94 % (05/31 0308) Weight:  [98.2 kg] 98.2 kg (05/31 0619)  Intake/Output from previous day: 05/30 0701 - 05/31 0700 In: 840 [P.O.:840] Out: 1400 [Urine:1400]  General appearance: alert, cooperative and no distress Heart: regular rate and rhythm Lungs: clear to auscultation bilaterally Abdomen: soft, non-tender; bowel sounds normal; no masses,  no organomegaly Extremities: edema trace Wound: clean and dry  Lab Results: Recent Labs    09/26/20 0102 09/27/20 0032  WBC 6.6 5.4  HGB 10.4* 10.0*  HCT 29.0* 28.7*  PLT 120* 142*   BMET:  Recent Labs    09/26/20 0102 09/27/20 0032  NA 135 135  K 3.7 3.5  CL 100 97*  CO2 27 30  GLUCOSE 108* 127*  BUN 8 8  CREATININE 1.07 1.07  CALCIUM 7.8* 8.3*    PT/INR:  Recent Labs    09/28/20 0148  LABPROT 20.7*  INR 1.8*   ABG    Component Value Date/Time   PHART 7.371 09/22/2020 2309   HCO3 19.1 (L) 09/22/2020 2309   TCO2 20 (L) 09/22/2020 2309   ACIDBASEDEF 5.0 (H) 09/22/2020 2309   O2SAT 99.0 09/22/2020 2309   CBG  (last 3)  No results for input(s): GLUCAP in the last 72 hours.  Assessment/Plan: S/P Procedure(s) (LRB): MINIMALLY INVASIVE MITRAL VALVE REPAIR (MVR) USING MEDTRONIC SIMUFORM SEMI-RIGID ANNULOPLASTY RING SIZE ON PUMP (Right) TRANSESOPHAGEAL ECHOCARDIOGRAM (TEE) (N/A)  1. CV- PAF, in NSR currently- continue Amiodarone, Cardizem 2. INR 1.8, continue coumadin at 1 mg daily 3. Pulm- no acute issues, continue IS 4. Renal- creatinine stable, K is low at 3.5, will increase supplement, continue Lasix 5. Hypothyroidism- continue Lasix 6. Dispo- patient in NSR, overall feeling unwell today, supplement K, montior this morning.[name] if patient feels better possibly for d/c later today vs Am  LOS: 6 days    Lowella Dandy, PA-C 09/28/2020    I have seen and examined the patient and agree with the assessment and plan as outlined.  Overall looks fine.  Lots of complaints but nothing that sounds concerning.  Maintaining NSR w/ stable BP and breathing comfortably on room air.  CXR yesterday looks good.  Stop Cardizem.  Start low dose metoprolol - patient's reported drug "allergy" was bradycardia with high dose metoprolol.  Mobilize.  Continue diuresis.  Possible d/c home 1-2 days.  Purcell Nails, MD 09/28/2020 2:46 PM

## 2020-09-28 NOTE — Progress Notes (Signed)
Patient stated he feels like he has a "floater" in his left eye and is seeing shooting stars out of his right eye. NIH stroke scale performed, ruled out. Blood sugar checked, 157.  PA paged. Awaiting response.  Kollen Armenti L Enslie Sahota, RN  

## 2020-09-28 NOTE — Progress Notes (Signed)
Mobility Specialist: Progress Note   09/28/20 1703  Mobility  Activity Ambulated in hall  Level of Assistance Modified independent, requires aide device or extra time  Assistive Device Front wheel walker  Distance Ambulated (ft) 400 ft  Mobility Ambulated independently in hallway  Mobility Response Tolerated well  Mobility performed by Mobility specialist  $Mobility charge 1 Mobility   Pre-Mobility: 76 HR, 97% SpO2 During Mobility: 80 HR, 96% SpO2 Post-Mobility: 80 HR, 99% SpO2  Pt c/o feeling a little light headed during ambulation, otherwise asx. Pt sitting EOB after walk with pt's wife present in the room.   [facility] Mobility Specialist Mobility Specialist Phone: [phone]

## 2020-09-28 NOTE — Progress Notes (Signed)
CARDIAC REHAB PHASE I   PRE:  Rate/Rhythm: 77 SR  BP:  Supine: 153/89  Sitting:   Standing:    SaO2: 91%RA  MODE:  Ambulation: 400 ft   POST:  Rate/Rhythm: 94 SR  BP:  Supine:   Sitting: 160/90  Standing:    SaO2: 96%RA 1000-1115 Pt walked 400 ft on RA with rolling walker with steady gait. Encouraged pt to take rest stops if needed as he stated he did not feel as well today as yesterday. Pt stated his legs weaker. Pt stated has not eaten much today and has not gotten much sleep. Discussed with pt that sometimes you have good days and bad days. Encouraged him to order lunch and try to find something that appeals to him. If not, ask for a supplemental drink like ENsure. Back to bed after walk. Vitals as above. Education completed with pt and wife who voiced understanding. Reviewed walking for ex, gave heart healthy diet (encouraged to watch carbs with A1C of 6), encouraged IS,,and reviewed restrictions. Discussed CRP 2 and will send referral letter to [facility] as pt lives in [place]. Pt would like rolling walker for home. Notified RN.   Luetta Nutting, RN BSN  09/28/2020 11:10 AM

## 2020-09-29 LAB — PROTIME-INR
INR: 1.7 — ABNORMAL HIGH (ref 0.8–1.2)
Prothrombin Time: 20.3 seconds — ABNORMAL HIGH (ref 11.4–15.2)

## 2020-09-29 LAB — BASIC METABOLIC PANEL
Anion gap: 12 (ref 5–15)
BUN: 13 mg/dL (ref 8–23)
CO2: 25 mmol/L (ref 22–32)
Calcium: 8.9 mg/dL (ref 8.9–10.3)
Chloride: 96 mmol/L — ABNORMAL LOW (ref 98–111)
Creatinine, Ser: 1.22 mg/dL (ref 0.61–1.24)
GFR, Estimated: >60 mL/min (ref >60)
Glucose, Bld: 112 mg/dL — ABNORMAL HIGH (ref 70–99)
Potassium: 4.6 mmol/L (ref 3.5–5.1)
Sodium: 133 mmol/L — ABNORMAL LOW (ref 135–145)

## 2020-09-29 LAB — CBC
HCT: 33.1 % — ABNORMAL LOW (ref 39.0–52.0)
Hemoglobin: 11.5 g/dL — ABNORMAL LOW (ref 13.0–17.0)
MCH: 32.4 pg (ref 26.0–34.0)
MCHC: 34.7 g/dL (ref 30.0–36.0)
MCV: 93.2 fL (ref 80.0–100.0)
Platelets: 100 10*3/uL — ABNORMAL LOW (ref 150–400)
RBC: 3.55 MIL/uL — ABNORMAL LOW (ref 4.22–5.81)
RDW: 12.9 % (ref 11.5–15.5)
WBC: 6.4 10*3/uL (ref 4.0–10.5)
nRBC: 0 % (ref 0.0–0.2)

## 2020-09-29 LAB — MAGNESIUM: Magnesium: 1.8 mg/dL (ref 1.7–2.4)

## 2020-09-29 MED ORDER — METOPROLOL TARTRATE 25 MG PO TABS: 12.5000 mg | ORAL_TABLET | Freq: Two times a day (BID) | ORAL | 3 refills | Status: DC

## 2020-09-29 MED ORDER — WARFARIN SODIUM 2 MG PO TABS: 2.0000 mg | ORAL_TABLET | Freq: Every day | ORAL | Status: DC

## 2020-09-29 MED ORDER — FUROSEMIDE 40 MG PO TABS: 40.0000 mg | ORAL_TABLET | Freq: Every day | ORAL | 0 refills | Status: DC

## 2020-09-29 MED ORDER — WARFARIN SODIUM 2 MG PO TABS: 2.0000 mg | ORAL_TABLET | Freq: Every day | ORAL | 3 refills | Status: DC

## 2020-09-29 MED ORDER — LOSARTAN POTASSIUM 25 MG PO TABS: 25.0000 mg | ORAL_TABLET | Freq: Every day | ORAL | 3 refills | Status: DC

## 2020-09-29 MED ORDER — ASPIRIN 81 MG PO TBEC: 81.0000 mg | DELAYED_RELEASE_TABLET | Freq: Every day | ORAL | 11 refills | Status: DC

## 2020-09-29 MED ORDER — OXYCODONE HCL 5 MG PO TABS: 5.0000 mg | ORAL_TABLET | ORAL | 0 refills | Status: DC | PRN

## 2020-09-29 MED ORDER — MAGNESIUM SULFATE 4 GM/100ML IV SOLN
4.0000 g | Freq: Once | INTRAVENOUS | Status: AC
  Administered 2020-09-29: 4 g via INTRAVENOUS
  Filled 2020-09-29: qty 100

## 2020-09-29 MED ORDER — AMIODARONE HCL 200 MG PO TABS: 200.0000 mg | ORAL_TABLET | Freq: Two times a day (BID) | ORAL | 3 refills | Status: DC

## 2020-09-29 MED ORDER — POTASSIUM CHLORIDE CRYS ER 20 MEQ PO TBCR: 20.0000 meq | EXTENDED_RELEASE_TABLET | Freq: Every day | ORAL | 0 refills | Status: DC

## 2020-09-29 NOTE — Progress Notes (Signed)
IV team order cancelled verbal due to patient being discharged and medications being changed to PO per Cardiologist

## 2020-09-29 NOTE — Progress Notes (Addendum)
      [address]       [place] [address]             [phone]      7 Days Post-Op Procedure(s) (LRB): MINIMALLY INVASIVE MITRAL VALVE REPAIR (MVR) USING MEDTRONIC SIMUFORM SEMI-RIGID ANNULOPLASTY RING SIZE ON PUMP (Right) TRANSESOPHAGEAL ECHOCARDIOGRAM (TEE) (N/A)   Subjective:  Patient complains of "crick" in his back.  He states its an 8/10 pain wise.  + ambulation   + BM  Objective: Vital signs in last 24 hours: Temp:  [96 F (35.6 C)-98.3 F (36.8 C)] 96 F (35.6 C) (06/01 0735) Pulse Rate:  [69-88] 84 (06/01 0735) Cardiac Rhythm: Normal sinus rhythm (06/01 0210) Resp:  [16-18] 18 (06/01 0735) BP: (123-159)/(75-94) 153/85 (06/01 0735) SpO2:  [92 %-97 %] 94 % (06/01 0735) Weight:  [98.2 kg] 98.2 kg (06/01 0333)  General appearance: alert, cooperative and no distress Heart: regular rate and rhythm Lungs: clear to auscultation bilaterally Abdomen: soft, non-tender; bowel sounds normal; no masses,  no organomegaly Extremities: edema trace Wound: clean and dry  Lab Results: Recent Labs    09/27/20 0032 09/29/20 0130  WBC 5.4 6.4  HGB 10.0* 11.5*  HCT 28.7* 33.1*  PLT 142* 100*   BMET:  Recent Labs    09/27/20 0032 09/29/20 0130  NA 135 133*  K 3.5 4.6  CL 97* 96*  CO2 30 25  GLUCOSE 127* 112*  BUN 8 13  CREATININE 1.07 1.22  CALCIUM 8.3* 8.9    PT/INR:  Recent Labs    09/29/20 0130  LABPROT 20.3*  INR 1.7*   ABG    Component Value Date/Time   PHART 7.371 09/22/2020 2309   HCO3 19.1 (L) 09/22/2020 2309   TCO2 20 (L) 09/22/2020 2309   ACIDBASEDEF 5.0 (H) 09/22/2020 2309   O2SAT 99.0 09/22/2020 2309   CBG (last 3)  Recent Labs    09/28/20 1410  GLUCAP 157*    Assessment/Plan: S/P Procedure(s) (LRB): MINIMALLY INVASIVE MITRAL VALVE REPAIR (MVR) USING MEDTRONIC SIMUFORM SEMI-RIGID ANNULOPLASTY RING SIZE ON PUMP (Right) TRANSESOPHAGEAL ECHOCARDIOGRAM (TEE) (N/A)  1. CV- NSR, on Amiodarone, Lopressor,   HTN- restart home Cozaar at reduced dose 2. INR 1.7, continue coumadin, will increase to 2.5 mg daily at discharge 3. Pulm- no acute issues, continue IS 4. Renal- creatinine stable, remains volume overloaded, taper Lasix 5. Back pain- supportive care (ice/heat per patient preference) mobility 6. Dispo- patient stable, will restart home cozaar at reduced dose, ambulate, d/c home today   LOS: 7 days    Lowella Dandy, PA-C 09/29/2020    I have seen and examined the patient and agree with the assessment and plan as outlined.  Purcell Nails, MD 09/29/2020 8:43 AM

## 2020-09-29 NOTE — Progress Notes (Addendum)
Discharge instructions given to patient. Wife present. Telemetry box removed, CCMD notified. Personal belongings bagged up and taken with patients wife to vehicle. Patient taken to vehicle in wheelchair by staff.  Kenard Gower, RN    *Pt was discharged without receiving walker. Pt was given option to stay and wait on walker (at entrance way) and decided to leave without. Pt was called and told walker is available now if he could come pick it up. Pt stated he will come pick it up tomorrow at desk. Dan Humphreys is currently in conference room 4E with pts name on it.

## 2020-09-29 NOTE — Progress Notes (Signed)
[phone] Pt stated he just got back from walk. Looking forward to going home. No questions re ed done yesterday. Answered wife's question re referral letter that will be faxed to [facility] CRP 2. Luetta Nutting RN BSN 09/29/2020 10:06 AM

## 2020-09-29 NOTE — TOC Transition Note (Signed)
Transition of Care [facility]) - CM/SW Discharge Note   Patient Details  Name: Chad Berry MRN: [id number] Date of Birth: 08-Sep-1940  [facility] of [facility]) CM/SW Contact:  Lawerance Sabal, RN Phone Number: 09/29/2020, 8:50 AM   Clinical Narrative:    Sherron Monday w patient over the phone. Confirmed need for RW, he declined needing any other DME.  RW to be delivered to the room this morning    Final next level of care: Home/Self Care Barriers to Discharge: No Barriers Identified   Patient Goals and CMS Choice        Discharge Placement                       Discharge Plan and Services                DME Arranged: Walker rolling DME Agency: [employer] Date DME Agency Contacted: 09/29/20 Time DME Agency Contacted: [phone] Representative spoke with at DME Agency: Left Message            Social Determinants of Health (SDOH) Interventions     Readmission Risk Interventions No flowsheet data found.

## 2020-09-29 NOTE — Progress Notes (Signed)
Patient complaint of back pain,after giving  Tramadol and oxycodone earlier,aching 7 out of 10 as described.Dr .Cliffton Asters made aware.Will wait for morning rounds to address the pain.Will continue to monitor

## 2020-09-30 ENCOUNTER — Other Ambulatory Visit: Payer: Self-pay

## 2020-09-30 ENCOUNTER — Ambulatory Visit (INDEPENDENT_AMBULATORY_CARE_PROVIDER_SITE_OTHER): Payer: 59

## 2020-09-30 DIAGNOSIS — Z5181 Encounter for therapeutic drug level monitoring: Secondary | ICD-10-CM

## 2020-09-30 DIAGNOSIS — Z7901 Long term (current) use of anticoagulants: Secondary | ICD-10-CM | POA: Insufficient documentation

## 2020-09-30 DIAGNOSIS — Z9889 Other specified postprocedural states: Secondary | ICD-10-CM

## 2020-09-30 LAB — POCT INR: INR: 3 (ref 2.0–3.0)

## 2020-09-30 NOTE — Patient Instructions (Signed)
Take 1 tablet Daily.  INR check 1 week.  A full discussion of the nature of anticoagulants has been carried out.  A benefit risk analysis has been presented to the patient, so that they understand the justification for choosing anticoagulation at this time. The need for frequent and regular monitoring, precise dosage adjustment and compliance is stressed.  Side effects of potential bleeding are discussed.  The patient should avoid any OTC items containing aspirin or ibuprofen, and should avoid great swings in general diet.  Avoid alcohol consumption.  Call if any signs of abnormal bleeding.  [phone]

## 2020-10-01 ENCOUNTER — Telehealth (HOSPITAL_COMMUNITY): Payer: Self-pay

## 2020-10-01 NOTE — Telephone Encounter (Signed)
Per phase I cardiac rehab, fax cardiac rehab referral to [facility] cardiac rehab [place].

## 2020-10-02 ENCOUNTER — Other Ambulatory Visit: Payer: Self-pay

## 2020-10-02 ENCOUNTER — Emergency Department (HOSPITAL_COMMUNITY)
Admission: EM | Admit: 2020-10-02 | Discharge: 2020-10-02 | Disposition: A | Discharge status: Home / Self Care | Payer: 59 | Attending: Emergency Medicine | Admitting: Emergency Medicine

## 2020-10-02 ENCOUNTER — Encounter (HOSPITAL_COMMUNITY): Payer: Self-pay | Admitting: Emergency Medicine

## 2020-10-02 DIAGNOSIS — K5900 Constipation, unspecified: Secondary | ICD-10-CM | POA: Insufficient documentation

## 2020-10-02 DIAGNOSIS — R109 Unspecified abdominal pain: Secondary | ICD-10-CM | POA: Insufficient documentation

## 2020-10-02 DIAGNOSIS — Z7982 Long term (current) use of aspirin: Secondary | ICD-10-CM | POA: Diagnosis not present

## 2020-10-02 DIAGNOSIS — Z87891 Personal history of nicotine dependence: Secondary | ICD-10-CM | POA: Insufficient documentation

## 2020-10-02 DIAGNOSIS — E039 Hypothyroidism, unspecified: Secondary | ICD-10-CM | POA: Insufficient documentation

## 2020-10-02 DIAGNOSIS — R42 Dizziness and giddiness: Secondary | ICD-10-CM | POA: Diagnosis not present

## 2020-10-02 DIAGNOSIS — I10 Essential (primary) hypertension: Secondary | ICD-10-CM | POA: Diagnosis not present

## 2020-10-02 DIAGNOSIS — Z79899 Other long term (current) drug therapy: Secondary | ICD-10-CM | POA: Insufficient documentation

## 2020-10-02 MED ORDER — MAGNESIUM CITRATE PO SOLN
1.0000 | Freq: Once | ORAL | Status: AC
  Administered 2020-10-02: 1 via ORAL
  Filled 2020-10-02: qty 296

## 2020-10-02 MED ORDER — MILK AND MOLASSES ENEMA
1.0000 | Freq: Once | RECTAL | Status: AC
  Administered 2020-10-02: 240 mL via RECTAL
  Filled 2020-10-02: qty 240

## 2020-10-02 NOTE — Discharge Instructions (Signed)

## 2020-10-02 NOTE — ED Triage Notes (Signed)
Pt c/o abd pain and constipation for the past 2 days. Pt also c/o dizziness when standing. Pt tried OTC medications with no relief.

## 2020-10-03 NOTE — ED Provider Notes (Signed)
[facility] EMERGENCY DEPARTMENT Provider Note   CSN: [id number] Arrival date & time: 10/02/20  0309     History Chief Complaint  Patient presents with  . Abdominal Pain  . Dizziness    Chad Berry is a 80 y.o. male.   Abdominal Pain Dizziness  Recently admitted for a valve replacement and has had intermittent bowel abnormalities since then. Mostly constipated. No BM for a couple days but enemas helped. Tonight, enema didn't help and pain worsened so came here. No fever. Some dizziness when standing, improving. No other issues.     Past Medical History:  Diagnosis Date  . Aortic atherosclerosis (HCC)   . Bradycardia    while on BB with HR to the 40s  . Carotid stenosis    CAROTID DOPPLER, 12/07/2011 - Mild arthrosclerotic changes, no high-grade stenosis  . Cataract    rt. eye  . Clostridium difficile infection 05/2019  . Diverticulitis   . Heart murmur   . History of echocardiogram    Echo 9/16:  EF 60-65%, no RWMA, Gr 1 DD, holosystolic MVP of posterior leaflet, mild MR, LA upper limits of normal, normal RVF, mod TR, PASP 50 mmHg  . Hypercholesteremia   . Hyperlipidemia   . Hypertension   . Hypothyroidism   . MVP (mitral valve prolapse)    a. Echo 12/07/2011 - EF-65-70%, severe mitral regurgitation, moderate-severe tricuspid regurgitation, moderate pulmonary HTN, moderate pulmonic regurgitation;  b. 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 Prolapsed internal hemorrhoids, grade 3 02/14/2017  . PVC (premature ventricular contraction)   . Rapid palpitations    event monitor with short bursts of SVT  . Renal cyst   . 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  Testicular hypofunction   . Vitamin D deficiency     Patient Active Problem List   Diagnosis Date Noted  . Long term (current) use of anticoagulants 09/30/2020  . 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 History of diverticulitis 06/06/2019  . Prolapsed internal hemorrhoids, grade 3 02/14/2017  . Palpitation 06/09/2015  . Mitral regurgitation 06/09/2015  . 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 Pain in both feet 08/03/2014  . 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 Carotid stenosis. bil 39% 09/25/2013  . Occlusion and stenosis of unspecified carotid artery 09/25/2013  . Rapid palpitations   . Mitral valve prolapse 12/23/2012  . Essential hypertension 12/23/2012  . Hyperlipidemia 12/23/2012    Past Surgical History:  Procedure Laterality Date  . COLONOSCOPY    . ETHMOIDECTOMY Left 08/13/2020   Procedure: SPENOIDECTOMY AND ETHMOIDECTOMY;  Surgeon: Drema Halon, MD;  Location: [facility] OR;  Service: ENT;  Laterality: Left;  . MITRAL VALVE REPAIR Right 09/22/2020   Procedure: MINIMALLY INVASIVE MITRAL VALVE REPAIR (MVR) USING MEDTRONIC SIMUFORM SEMI-RIGID ANNULOPLASTY RING SIZE ON PUMP;  Surgeon: Purcell Nails, MD;  Location: [facility] OR;  Service: Open Heart Surgery;  Laterality: Right;  . RIGHT/LEFT HEART CATH AND CORONARY ANGIOGRAPHY N/A 08/30/2020   Procedure: RIGHT/LEFT HEART CATH AND CORONARY ANGIOGRAPHY;  Surgeon: Runell Gess, MD;  Location: [facility] INVASIVE CV LAB;  Service: Cardiovascular;  Laterality: N/A;  . SIGMOIDOSCOPY  02/14/2017  . SINUS ENDO WITH FUSION  Left 08/13/2020   Procedure: SINUS ENDO WITH FUSION;  Surgeon: Drema Halon, MD;  Location: [facility] OR;  Service: ENT;  Laterality: Left;  . TEE WITHOUT CARDIOVERSION N/A 05/04/2020   Procedure: TRANSESOPHAGEAL ECHOCARDIOGRAM (TEE);  Surgeon: Little Ishikawa, MD;  Location: [facility] ENDOSCOPY;  Service: Cardiovascular;  Laterality: N/A;  . TEE WITHOUT  CARDIOVERSION N/A 09/22/2020   Procedure: TRANSESOPHAGEAL ECHOCARDIOGRAM (TEE);  Surgeon: Purcell Nails, MD;  Location: [facility] OR;  Service: Open Heart Surgery;  Laterality: N/A;       Family History  Problem Relation Age of Onset  . Stroke Mother 50  . Heart failure Father   . Diverticulitis Sister   . Stroke Brother 58  . Heart attack Brother   . Colon cancer Neg Hx   . Esophageal cancer Neg Hx   . Stomach cancer Neg Hx   . Rectal cancer Neg Hx     Social History   Tobacco Use  . Smoking status: Former Smoker    Quit date: 12/23/1972    Years since quitting: 47.8  . Smokeless tobacco: Former Neurosurgeon    Types: Engineer, drilling  . Vaping Use: Never used  Substance Use Topics  . Alcohol use: Yes    Alcohol/week: 0.0 standard drinks    Comment: occasionally  . Drug use: No    Home Medications Prior to Admission medications   Medication Sig Start Date End Date Taking? Authorizing Provider  amiodarone (PACERONE) 200 MG tablet Take 1 tablet (200 mg total) by mouth 2 (two) times daily. X 7 days, then decrease to 200 mg daily 09/29/20   Barrett, Rae Roam, PA-C  Ascorbic Acid (VITAMIN C) 1000 MG tablet Take 1,000 mg by mouth daily.    [provider]  aspirin EC 81 MG EC tablet Take 1 tablet (81 mg total) by mouth daily. Swallow whole. 09/29/20   Barrett, Rae Roam, PA-C  Cholecalciferol (VITAMIN D) 50 MCG (2000 UT) tablet Take 2,000 Units by mouth daily.    [provider]  ezetimibe (ZETIA) 10 MG tablet Take 10 mg by mouth at bedtime.    [provider]  fluticasone (FLONASE) 50 MCG/ACT nasal spray Place 1 spray into both nostrils at bedtime.    [provider]  furosemide (LASIX) 40 MG tablet Take 1 tablet (40 mg total) by mouth daily. 09/29/20   Barrett, Rae Roam, PA-C  levothyroxine (SYNTHROID) 75 MCG tablet Take 75 mcg by mouth at bedtime.    [provider]  losartan (COZAAR) 25 MG tablet Take 1 tablet (25 mg total) by mouth daily. 09/29/20 09/29/21   Barrett, Erin R, PA-C  metoprolol tartrate (LOPRESSOR) 25 MG tablet Take 0.5 tablets (12.5 mg total) by mouth 2 (two) times daily. 09/29/20   Barrett, Erin R, PA-C  oxyCODONE (OXY IR/ROXICODONE) 5 MG immediate release tablet Take 1 tablet (5 mg total) by mouth every 4 (four) hours as needed for severe pain. 09/29/20   Barrett, Erin R, PA-C  potassium chloride SA (KLOR-CON) 20 MEQ tablet Take 1 tablet (20 mEq total) by mouth daily. 09/29/20   Barrett, Erin R, PA-C  rosuvastatin (CRESTOR) 40 MG tablet Take 40 mg by mouth at bedtime.    [provider]  sodium chloride (OCEAN) 0.65 % SOLN nasal spray Place 1 spray into both nostrils as needed for congestion.    [provider]  warfarin (COUMADIN) 2 MG tablet Take 1 tablet (2 mg total) by mouth daily at 4 PM. 09/29/20  Barrett, Erin R, PA-C    Allergies    Contrast media [iodinated diagnostic agents], Hctz [hydrochlorothiazide], and Metoprolol succinate [metoprolol]  Review of Systems   Review of Systems  Gastrointestinal: Positive for abdominal pain.  Neurological: Positive for dizziness.  All other systems reviewed and are negative.   Physical Exam Updated Vital Signs BP 127/76   Pulse 86   Temp 98.5 F (36.9 C) (Oral)   Resp 20   Ht 5\' 11"  (1.803 m)   Wt 98.2 kg   SpO2 98%   BMI 30.19 kg/m   Physical Exam Vitals and nursing note reviewed.  Constitutional:      Appearance: He is well-developed.  HENT:     Head: Normocephalic and atraumatic.  Cardiovascular:     Rate and Rhythm: Normal rate.  Pulmonary:     Effort: Pulmonary effort is normal. No respiratory distress.  Abdominal:     General: Bowel sounds are normal. There is no distension.     Tenderness: There is no abdominal tenderness.     Hernia: No hernia is present.  Musculoskeletal:        General: Normal range of motion.     Cervical back: Normal range of motion.  Neurological:     Mental Status: He is alert.     ED Results / Procedures /  Treatments   Labs (all labs ordered are listed, but only abnormal results are displayed) Labs Reviewed - No data to display  EKG None  Radiology No results found.  Procedures Procedures   Medications Ordered in ED Medications  milk and molasses enema (240 mLs Rectal Given 10/02/20 0547)  magnesium citrate solution 1 Bottle (1 Bottle Oral Given 10/02/20 0533)    ED Course  I have reviewed the triage vital signs and the nursing notes.  Pertinent labs & imaging results that were available during my care of the patient were reviewed by me and considered in my medical decision making (see chart for details).    MDM Rules/Calculators/A&P                          Doubt SBO. Constipation improved with enema and mag citrate here. Stable for discharge.   Final Clinical Impression(s) / ED Diagnoses Final diagnoses:  Constipation, unspecified constipation type    Rx / DC Orders ED Discharge Orders    None       Iasia Forcier, 12/02/20, MD 10/03/20 0127

## 2020-10-04 ENCOUNTER — Ambulatory Visit (INDEPENDENT_AMBULATORY_CARE_PROVIDER_SITE_OTHER): Payer: Self-pay | Admitting: Surgical

## 2020-10-04 ENCOUNTER — Ambulatory Visit
Admission: RE | Admit: 2020-10-04 | Discharge: 2020-10-04 | Disposition: A | Discharge status: Home / Self Care | Payer: 59 | Source: Ambulatory Visit | Attending: Surgical | Admitting: Surgical

## 2020-10-04 ENCOUNTER — Other Ambulatory Visit: Payer: Self-pay

## 2020-10-04 VITALS — BP 101/67 | HR 61

## 2020-10-04 DIAGNOSIS — Z9889 Other specified postprocedural states: Secondary | ICD-10-CM

## 2020-10-04 DIAGNOSIS — Z4802 Encounter for removal of sutures: Secondary | ICD-10-CM

## 2020-10-04 NOTE — Patient Instructions (Signed)
Instructed patient to stop Lasix and potassium.  Also instructed him to not take his ARB if systolic less than 120.

## 2020-10-04 NOTE — Progress Notes (Signed)
[address]       [place] [address]             [phone]      Chad Berry [facility] Medical Record #[id number] Date of Birth: [birth year]  Referring: Runell Gess, MD Primary Care: Mosetta Putt, MD Primary Cardiologist: Nanetta Batty, MD   Chief Complaint:   POST OP FOLLOW UP CARDIOTHORACIC SURGERY OPERATIVE NOTE  Date of Procedure:                09/22/2020  Preoperative Diagnosis:      Severe Mitral Regurgitation  Postoperative Diagnosis:    Same  Procedure:        Minimally-Invasive Mitral Valve Repair             Complex valvuloplasty including artificial PTFE neochord placement x10             Medtronic Simuform Ring Annuloplasty (size 80mm, catalog # [id number], serial # [id number])               Surgeon:        Salvatore Decent. Cornelius Moras, MD  Assistant:       Lowella Dandy, PA-C  Anesthesia:    Hester Mates, DO  Operative Findings: ? Fibroelastic deficiency type myxomatous degenerative disease ? Multiple elongated and ruptured primary chordae tendinae ? Type II dysfunction with severe mitral regurgitation ? Normal left ventricular systolic function ? No residual mitral regurgitation after successful valve repair  History of Present Illness:    Patient is seen on today's date and routine postsurgical follow-up.  He was scheduled for suture removal by the office nursing staff and after removing suture was noted to have a significant amount of serosanguineous drainage requiring multiple chest tube site dressings.  He also complains of dizziness that is limiting his ability to ambulate and generally making him feel somewhat poor.  Upon initial discharge he was ambulating much better but this has slowed over time.      Past Medical History:  Diagnosis Date  . Aortic atherosclerosis (HCC)   . Bradycardia    while on BB with HR to the 40s  . Carotid stenosis    CAROTID DOPPLER, 12/07/2011 - Mild arthrosclerotic changes, no  high-grade stenosis  . Cataract    rt. eye  . Clostridium difficile infection 05/2019  . Diverticulitis   . Heart murmur   . History of echocardiogram    Echo 9/16:  EF 60-65%, no RWMA, Gr 1 DD, holosystolic MVP of posterior leaflet, mild MR, LA upper limits of normal, normal RVF, mod TR, PASP 50 mmHg  . Hypercholesteremia   . Hyperlipidemia   . Hypertension   . Hypothyroidism   . MVP (mitral valve prolapse)    a. Echo 12/07/2011 - EF-65-70%, severe mitral regurgitation, moderate-severe tricuspid regurgitation, moderate pulmonary HTN, moderate pulmonic regurgitation;  b. 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 Prolapsed internal hemorrhoids, grade 3 02/14/2017  . PVC (premature ventricular contraction)   . Rapid palpitations    event monitor with short bursts of SVT  . Renal cyst   . 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 Testicular hypofunction   . Vitamin D deficiency  Social History   Tobacco Use  Smoking Status Former Smoker  . Quit date: 12/23/1972  . Years since quitting: 6.8  Smokeless Tobacco Former Neurosurgeon  . Types: Chew    Social History   Substance and Sexual Activity  Alcohol Use Yes  . Alcohol/week: 0.0 standard drinks   Comment: occasionally     Allergies  Allergen Reactions  . Contrast Media [Iodinated Diagnostic Agents] Hives, Nausea And Vomiting and Cough    Pt will need a 13 hour prep before any contrast per Dr. Paulina Fusi (05/14/19)  . Hctz [Hydrochlorothiazide] Other (See Comments)    palpitations  . Metoprolol Succinate [Metoprolol] Other (See Comments)    Bradycardia      Current Outpatient Medications  Medication Sig Dispense  Refill  . amiodarone (PACERONE) 200 MG tablet Take 1 tablet (200 mg total) by mouth 2 (two) times daily. X 7 days, then decrease to 200 mg daily 60 tablet 3  . Ascorbic Acid (VITAMIN C) 1000 MG tablet Take 1,000 mg by mouth daily.    [name] aspirin EC 81 MG EC tablet Take 1 tablet (81 mg total) by mouth daily. Swallow whole. 30 tablet 11  . Cholecalciferol (VITAMIN D) 50 MCG (2000 UT) tablet Take 2,000 Units by mouth daily.    [name] ezetimibe (ZETIA) 10 MG tablet Take 10 mg by mouth at bedtime.    . fluticasone (FLONASE) 50 MCG/ACT nasal spray Place 1 spray into both nostrils at bedtime.    . furosemide (LASIX) 40 MG tablet Take 1 tablet (40 mg total) by mouth daily. 3 tablet 0  . levothyroxine (SYNTHROID) 75 MCG tablet Take 75 mcg by mouth at bedtime.    [name] losartan (COZAAR) 25 MG tablet Take 1 tablet (25 mg total) by mouth daily. 30 tablet 3  . metoprolol tartrate (LOPRESSOR) 25 MG tablet Take 0.5 tablets (12.5 mg total) by mouth 2 (two) times daily. 60 tablet 3  . oxyCODONE (OXY IR/ROXICODONE) 5 MG immediate release tablet Take 1 tablet (5 mg total) by mouth every 4 (four) hours as needed for severe pain. 30 tablet 0  . potassium chloride SA (KLOR-CON) 20 MEQ tablet Take 1 tablet (20 mEq total) by mouth daily. 7 tablet 0  . rosuvastatin (CRESTOR) 40 MG tablet Take 40 mg by mouth at bedtime.    . sodium chloride (OCEAN) 0.65 % SOLN nasal spray Place 1 spray into both nostrils as needed for congestion.    [name] warfarin (COUMADIN) 2 MG tablet Take 1 tablet (2 mg total) by mouth daily at 4 PM. 30 tablet 3   No current facility-administered medications for this visit.       Physical Exam: BP 101/67 (BP Location: Right Arm, Patient Position: Sitting)   Pulse 61   SpO2 95%   General appearance: alert, appears stated age, fatigued and no distress Heart: regular rate and rhythm Lungs: clear to auscultation bilaterally Abdomen: Benign Extremities: No edema Wound: Incisions healing well without evidence  of infection.  There is some serosanguineous drainage   Diagnostic Studies & Laboratory data:     Recent Radiology Findings:   DG Chest 2 View  Result Date: 10/04/2020 CLINICAL DATA:  Recent mitral valve repair. EXAM: CHEST - 2 VIEW COMPARISON:  Chest x-ray dated Sep 27, 2020. FINDINGS: Normal heart size status post mitral valve repair. Normal pulmonary vascularity. Improved aeration at the lung bases. Unchanged atelectasis/scarring in the right mid lung. Trace right pleural effusion. No consolidation or pneumothorax. No acute osseous abnormality. IMPRESSION:  1. Trace right pleural effusion. 2. Unchanged atelectasis/scarring in the right mid lung. Electronically Signed   By: Obie Dredge M.D.   On: 10/04/2020 12:32      Recent Lab Findings: Lab Results  Component Value Date   WBC 6.4 09/29/2020   HGB 11.5 (L) 09/29/2020   HCT 33.1 (L) 09/29/2020   PLT 100 (L) 09/29/2020   GLUCOSE 112 (H) 09/29/2020   ALT 23 09/20/2020   AST 22 09/20/2020   NA 133 (L) 09/29/2020   K 4.6 09/29/2020   CL 96 (L) 09/29/2020   CREATININE 1.22 09/29/2020   BUN 13 09/29/2020   CO2 25 09/29/2020   INR 3.0 09/30/2020   HGBA1C 6.0 (H) 09/20/2020      Assessment / Plan: Patient is overall doing well.  The drainage appears to be pleural effusion that is spontaneously draining through the former chest tube site.  A chest x-ray was obtained and there is only minimal effusion so anticipate this will stop over time.  In terms of his dizziness I went ahead and stopped his Lasix and potassium.  I also instructed the family and him not to take his ARB if his systolic blood pressure is less than 120.  It is typically running in the 100-110 range.  He has a follow-up with [place] next week and will ask cardiology.  He has been seen in Coumadin clinic who are monitoring his dosing.      Medication Changes: No orders of the defined types were placed in this encounter.    Rowe Clack, PA-C 10/04/2020 12:52  PM

## 2020-10-06 ENCOUNTER — Encounter (INDEPENDENT_AMBULATORY_CARE_PROVIDER_SITE_OTHER): Payer: 59 | Admitting: Otolaryngology

## 2020-10-07 ENCOUNTER — Other Ambulatory Visit: Payer: Self-pay | Admitting: Thoracic Surgery (Cardiothoracic Vascular Surgery)

## 2020-10-07 DIAGNOSIS — Z9889 Other specified postprocedural states: Secondary | ICD-10-CM

## 2020-10-08 ENCOUNTER — Ambulatory Visit (INDEPENDENT_AMBULATORY_CARE_PROVIDER_SITE_OTHER): Payer: 59

## 2020-10-08 ENCOUNTER — Telehealth: Payer: Self-pay | Admitting: Cardiovascular Disease

## 2020-10-08 ENCOUNTER — Other Ambulatory Visit: Payer: Self-pay

## 2020-10-08 DIAGNOSIS — Z7901 Long term (current) use of anticoagulants: Secondary | ICD-10-CM

## 2020-10-08 DIAGNOSIS — Z9889 Other specified postprocedural states: Secondary | ICD-10-CM

## 2020-10-08 LAB — POCT INR: INR: 1.4 — AB (ref 2.0–3.0)

## 2020-10-08 NOTE — Telephone Encounter (Signed)
Patient hhad Mitral Valve Repair surgery a couple weeks ago and wanted to talk to Dr. Allyson Sabal about his symptoms. He was hoping to talk to DR. Berry when he cam to the Coumadin clinic today

## 2020-10-08 NOTE — Telephone Encounter (Signed)
Left message for pt to call back  °

## 2020-10-08 NOTE — Patient Instructions (Signed)
Take 2 tablets today only and then continue take 1 tablet Daily.  INR check 1 week. [phone]

## 2020-10-11 ENCOUNTER — Ambulatory Visit (INDEPENDENT_AMBULATORY_CARE_PROVIDER_SITE_OTHER): Payer: 59

## 2020-10-11 ENCOUNTER — Ambulatory Visit (INDEPENDENT_AMBULATORY_CARE_PROVIDER_SITE_OTHER): Payer: Self-pay | Admitting: Physician Assistant

## 2020-10-11 ENCOUNTER — Other Ambulatory Visit: Payer: Self-pay

## 2020-10-11 ENCOUNTER — Ambulatory Visit
Admission: RE | Admit: 2020-10-11 | Discharge: 2020-10-11 | Disposition: A | Discharge status: Home / Self Care | Payer: 59 | Source: Ambulatory Visit | Attending: Thoracic Surgery (Cardiothoracic Vascular Surgery) | Admitting: Thoracic Surgery (Cardiothoracic Vascular Surgery)

## 2020-10-11 VITALS — BP 133/81 | HR 59 | Temp 98.4°F | Resp 20 | Ht 71.0 in | Wt 208.0 lb

## 2020-10-11 DIAGNOSIS — Z7901 Long term (current) use of anticoagulants: Secondary | ICD-10-CM | POA: Diagnosis not present

## 2020-10-11 DIAGNOSIS — Z9889 Other specified postprocedural states: Secondary | ICD-10-CM

## 2020-10-11 LAB — POCT INR: INR: 1.5 — AB (ref 2.0–3.0)

## 2020-10-11 NOTE — Patient Instructions (Signed)
Sleep: melatonin or benadryl, tylenol PM  Elevate legs  Antibiotics with dental procedures

## 2020-10-11 NOTE — Progress Notes (Signed)
[address]       Chad Berry [address]             [phone]         Chad Berry is a 80 y.o. male patient s/p minimally invasive mitral valve repair on 09/22/2020 with his only complication being atrial fibrillation. He was started on Amio and Cardizem which converted him to NSR. He was started on coumadin in the hospital and his most recent INR was 1.4.    1. S/P minimally invasive mitral valve repair    Past Medical History:  Diagnosis Date   Aortic atherosclerosis (HCC)    Bradycardia    while on BB with HR to the 40s   Carotid stenosis    CAROTID DOPPLER, 12/07/2011 - Mild arthrosclerotic changes, no high-grade stenosis   Cataract    rt. eye   Clostridium difficile infection 05/2019   Diverticulitis    Heart murmur    History of echocardiogram    Echo 9/16:  EF 60-65%, no RWMA, Gr 1 DD, holosystolic MVP of posterior leaflet, mild MR, LA upper limits of normal, normal RVF, mod TR, PASP 50 mmHg   Hypercholesteremia    Hyperlipidemia    Hypertension    Hypothyroidism    MVP (mitral valve prolapse)    a. Echo 12/07/2011 - EF-65-70%, severe mitral regurgitation, moderate-severe tricuspid regurgitation, moderate pulmonary HTN, moderate pulmonic regurgitation;  b. 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 Scheduled Meds: Current  Outpatient Medications on File Prior to Visit  Medication Sig Dispense Refill   amiodarone (PACERONE) 200 MG tablet Take 1 tablet (200 mg total) by mouth 2 (two) times daily. X 7 days, then decrease to 200 mg daily 60 tablet 3   Ascorbic Acid (VITAMIN C) 1000 MG tablet Take 1,000 mg by mouth daily.     aspirin EC 81 MG EC tablet Take 1 tablet (81 mg total) by mouth daily. Swallow whole. 30 tablet 11   Cholecalciferol (VITAMIN D) 50 MCG (2000 UT) tablet Take 2,000 Units by mouth daily.     ezetimibe (ZETIA) 10 MG tablet Take 10 mg by mouth at bedtime.     fluticasone (FLONASE) 50 MCG/ACT nasal spray Place 1 spray into both nostrils at bedtime.     levothyroxine (SYNTHROID) 75 MCG tablet Take 75 mcg by mouth at bedtime.     losartan (COZAAR) 25 MG tablet Take 1 tablet (25 mg total) by mouth daily. 30 tablet 3   metoprolol tartrate (LOPRESSOR) 25 MG tablet Take 0.5 tablets (12.5 mg total) by mouth 2 (two) times daily. 60 tablet 3   oxyCODONE (OXY IR/ROXICODONE) 5 MG immediate release tablet Take 1 tablet (5 mg total) by mouth every 4 (four) hours as needed for severe pain. 30 tablet 0   rosuvastatin (CRESTOR) 40 MG tablet Take 40 mg by  mouth at bedtime.     sodium chloride (OCEAN) 0.65 % SOLN nasal spray Place 1 spray into both nostrils as needed for congestion.     warfarin (COUMADIN) 2 MG tablet Take 1 tablet (2 mg total) by mouth daily at 4 PM. 30 tablet 3   furosemide (LASIX) 40 MG tablet Take 1 tablet (40 mg total) by mouth daily. (Patient not taking: Reported on 10/11/2020) 3 tablet 0   potassium chloride SA (KLOR-CON) 20 MEQ tablet Take 1 tablet (20 mEq total) by mouth daily. (Patient not taking: Reported on 10/11/2020) 7 tablet 0   No current facility-administered medications on file prior to visit.     Allergies  Allergen Reactions   Contrast Media [Iodinated Diagnostic Agents] Hives, Nausea And Vomiting and Cough    Pt will need a 13 hour prep before any contrast per Dr. Paulina Fusi  (05/14/19)   Hctz [Hydrochlorothiazide] Other (See Comments)    palpitations   Metoprolol Succinate [Metoprolol] Other (See Comments)    Bradycardia     Active Problems:   * No active hospital problems. *  Blood pressure 133/81, pulse (!) 59, temperature 98.4 F (36.9 C), resp. rate 20, height 5\' 11"  (1.803 m), weight 208 lb (94.3 kg), SpO2 97 %.  Subjective Objective: Vital signs (most recent): Blood pressure 133/81, pulse (!) 59, temperature 98.4 F (36.9 C), resp. rate 20, height 5\' 11"  (1.803 m), weight 208 lb (94.3 kg), SpO2 97 %.  Mr. Chad Berry presents today status post mitral valve repair with Dr. for his routine follow-up appointment.  He was seen by [facility] in the clinic last week and was brought back for further evaluation.  He still occasionally gets dizzy with standing and ambulation but his blood pressure has been much better.  He states that his systolic blood pressure has been in the 120s to 130s.  He had a concerning incident yesterday where he bent down to pick something off the ground and his vision went blurry for a few seconds.  After blinking his eyes his vision came back and was normal.  He also have been getting occasional headaches since surgery but they are usually mild and resolved with Tylenol.  He states that he has periods of waxing and waning with the way that he feels and if he overexerts himself too much 1 day he is very tired the next.  I explained that this is pretty normal for a postop patient and to try to pick a level of activity that does not require as much recovery the next day.  He also was concerned about an upcoming dental procedure where he has to get caps placed.  I encouraged him to get an antibiotic from the dentist before any dental procedures or cleanings.  He also is having some insomnia so I encouraged Tylenol PM, Benadryl, or melatonin.  I would not suggest anything heavier for him at this time.  Overall, he feels like he is making progress from  day today and overall feels better than he did at the beginning of his postop period.  He is following cardiology outpatient for his INR draws and he has an appointment and an INR draw on this Friday.  His blood pressure was stable today on exam but his heart rate was slightly bradycardic.  He did state that his heart rate is usually in the 50s to 60s.  He is on a low-dose of metoprolol and we discussed possibly holding this if his heart rate drops  below 55 bpm.  He has not had any recurrent episodes of atrial fibrillation as far as he knows and he will possibly be taken off of amiodarone in the near future depending on cardiology's preference.  He is encouraged to call our office if he has any further questions or his symptoms get worse.  If his visual changes become more frequent or more severe this might be an issue that we need to further look into.  He has an echocardiogram scheduled for July and cardiology follow-up right after.  I have requested to see him back in the clinic in 2 to 3 weeks to ensure that his symptoms have resolved and that he is overall feeling better.    Sharlene Dory 10/11/2020

## 2020-10-11 NOTE — Patient Instructions (Signed)
Take 2 tablets today only and then continue take 1 tablet Daily, except 1.5 tablets on Wednesday.  INR check 6/17. [phone]

## 2020-10-12 ENCOUNTER — Telehealth: Payer: Self-pay | Admitting: Cardiovascular Disease

## 2020-10-12 NOTE — Telephone Encounter (Signed)
See additional telephone encounter.

## 2020-10-12 NOTE — Telephone Encounter (Signed)
Routed to pharmacy team to assist INR was checked yesterday

## 2020-10-12 NOTE — Telephone Encounter (Signed)
    Pt said he went to have his coumadin check and it was 1.4 -1.5, the RN there told him is has higher chance of getting stroke. He wanted to check in with Dr. Allyson Sabal if he needs to be in rehab to be monitored 24/7 or what he needs to do to prevent stroke. He said he feels ok gave his vital signs. BP 131/80 HR 64 Temp. 75.7 and o2 95%. He said he will be home all day waiting for the nurse call

## 2020-10-12 NOTE — Telephone Encounter (Signed)
Called and spoke with patient.  Patient was concerned when he heard that a low INR was a risk for stroke.  Did not know if he needed to be admitted into cardiac rehab and be monitored since he lives in [place] and can not easily drive to [facility].  Explained to patient the reason we monitor INR and high INR levels can lead to a bleed while low levels can lead to a clot.  Explained that since his INR was low yesterday, he was given a booster dose and will be taking a higher dose tomorrow as well before his recheck this Friday.  While the incidence of stroke is low after his booster dose, it is never guaranteed, which is why we like to keep close monitoring.  Patient voiced understanding and will have INR checked on Friday

## 2020-10-13 ENCOUNTER — Other Ambulatory Visit: Payer: Self-pay | Admitting: *Deleted

## 2020-10-13 MED ORDER — FUROSEMIDE 40 MG PO TABS
40.0000 mg | ORAL_TABLET | Freq: Every day | ORAL | 0 refills | Status: DC
Start: 2020-10-13 — End: 2020-10-15

## 2020-10-13 MED ORDER — POTASSIUM CHLORIDE CRYS ER 20 MEQ PO TBCR
20.0000 meq | EXTENDED_RELEASE_TABLET | Freq: Every day | ORAL | 0 refills | Status: DC
Start: 2020-10-13 — End: 2020-10-15

## 2020-10-13 NOTE — Progress Notes (Signed)
Chad Berry contacted the office stating his right leg is edematous from his toes to his knee in addition to episodes "apnea" last night. Per patient the swelling has decreased since yesterday with elevation. Patient denies pain, warmth, or redness in leg. Advised patient to keep leg elevated at rest and to utilize compression socks in bilateral lower extremities. Patient states he had episodes of apnea that prevented him from getting a restful night of sleep. Patient states he was lying flat on his back. Patients verbalizes he is typically a side sleeper but has been unable to rest on his side since surgery. Encouraged patient to prop his back up at night. Advised that patient may take over the counter sleep agents to aid in a restful nights sleep such as melatonin, benadryl, or Tylenol PM. Discussed patients symptoms with T. Cal[place], [place]. Per PA, order for three days worth of lasix 40mg  and potassium sent to patients preferred pharmacy. Per T. , patient advised that if apnea persists at night, a referral may need to be made. Patient verbalizes understanding.

## 2020-10-15 ENCOUNTER — Encounter: Payer: Self-pay | Admitting: Physician Assistant

## 2020-10-15 ENCOUNTER — Ambulatory Visit (INDEPENDENT_AMBULATORY_CARE_PROVIDER_SITE_OTHER): Payer: 59 | Admitting: Physician Assistant

## 2020-10-15 ENCOUNTER — Other Ambulatory Visit: Payer: Self-pay

## 2020-10-15 ENCOUNTER — Ambulatory Visit (INDEPENDENT_AMBULATORY_CARE_PROVIDER_SITE_OTHER): Payer: 59

## 2020-10-15 VITALS — BP 122/78 | HR 63 | Ht 70.0 in | Wt 208.6 lb

## 2020-10-15 DIAGNOSIS — E039 Hypothyroidism, unspecified: Secondary | ICD-10-CM

## 2020-10-15 DIAGNOSIS — I2510 Atherosclerotic heart disease of native coronary artery without angina pectoris: Secondary | ICD-10-CM

## 2020-10-15 DIAGNOSIS — I480 Paroxysmal atrial fibrillation: Secondary | ICD-10-CM

## 2020-10-15 DIAGNOSIS — Z9889 Other specified postprocedural states: Secondary | ICD-10-CM

## 2020-10-15 DIAGNOSIS — E785 Hyperlipidemia, unspecified: Secondary | ICD-10-CM | POA: Diagnosis not present

## 2020-10-15 DIAGNOSIS — I10 Essential (primary) hypertension: Secondary | ICD-10-CM | POA: Diagnosis not present

## 2020-10-15 DIAGNOSIS — Z7901 Long term (current) use of anticoagulants: Secondary | ICD-10-CM

## 2020-10-15 LAB — POCT INR: INR: 2 (ref 2.0–3.0)

## 2020-10-15 NOTE — Patient Instructions (Signed)
continue take 1 tablet Daily, except 1.5 tablets on Wednesday.  INR check 2 weeks. [phone]

## 2020-10-15 NOTE — Patient Instructions (Signed)
Medication Instructions:  Stop Metoprolol *If you need a refill on your cardiac medications before your next appointment, please call your pharmacy*   Lab Work: No Labs If you have labs (blood work) drawn today and your tests are completely normal, you will receive your results only by: MyChart Message (if you have MyChart) OR A paper copy in the mail If you have any lab test that is abnormal or we need to change your treatment, we will call you to review the results.   Testing/Procedures: No Testing   Follow-Up: At [facility], you and your health needs are our priority.  As part of our continuing mission to provide you with exceptional heart care, we have created designated Provider Care Teams.  These Care Teams include your primary Cardiologist (physician) and Advanced Practice Providers (APPs -  Physician Assistants and Nurse Practitioners) who all work together to provide you with the care you need, when you need it.  We recommend signing up for the patient portal called "MyChart".  Sign up information is provided on this After Visit Summary.  MyChart is used to connect with patients for Virtual Visits (Telemedicine).  Patients are able to view lab/test results, encounter notes, upcoming appointments, etc.  Non-urgent messages can be sent to your provider as well.   To learn more about what you can do with MyChart, go to [email or web address].    Your next appointment:   November 16, 2020 10:45 AM  The format for your next appointment:   In Person  Provider:   Nanetta Batty, MD

## 2020-10-15 NOTE — Progress Notes (Signed)
Cardiology Office Note:    Date:  10/17/2020   ID:  Chad Berry, DOB 1941/01/24, MRN [id number]  PCP:  Mosetta Putt, MD   [facility] Providers Cardiologist:  Nanetta Batty, MD     Referring MD: Mosetta Putt, MD   Chief Complaint  Patient presents with   Follow-up    Seen for Dr. Allyson Sabal    History of Present Illness:    Chad Berry is a 80 y.o. 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  Over the years, patient has been getting annual echocardiogram to follow-up on mitral regurgitation.  Last TTE obtained in November 2021 showed severe MR.  Subsequent TEE obtained on 05/05/2019 showed EF 60 to 65%, trivial AI, flail P2 segment of the posterior leaflet of mitral valve causing eccentric anterior directed MR jet, severe MR, mild dilatation of the ascending aorta measuring 38 mm.  Prior to mitral valve repair, cardiac catheterization on 08/30/2020 revealed minimal CAD with 30% proximal to mid RCA, 30% mid LAD.  Preoperative Doppler obtained on 09/20/2020 showed mild carotid disease bilaterally.  He ultimately underwent mitral valve annuloplasty by Dr. Cornelius Moras on 09/22/2020.  He did develop atrial fibrillation during postoperative course and was treated with IV amiodarone with conversion to sinus rhythm.   Patient presents today for follow-up.  Right pectoral scar seems to be very well-healed.  Since discharge, he has been having significant amount of dizziness, weakness and facial tingling.  This typically occurs right after he takes the medication in the morning and also the afternoon.  He attributed the medication to metoprolol.  My fear is his blood pressure may be low since discharge.  The timing and onset of the symptoms seems to be right after he takes the metoprolol dose.  I decided to stop the metoprolol at this time.  He is maintaining sinus rhythm based on today's EKG.  He can obtain the  echocardiogram and follow-up with Dr. Allyson Sabal in July, by which time, he may be able to stop the amiodarone as well.  Past Medical History:  Diagnosis Date   Aortic atherosclerosis (HCC)    Bradycardia    while on BB with HR to the 40s   Carotid stenosis    CAROTID DOPPLER, 12/07/2011 - Mild arthrosclerotic changes, no high-grade stenosis   Cataract    rt. eye   Clostridium difficile infection 05/2019   Diverticulitis    Heart murmur    History of echocardiogram    Echo 9/16:  EF 60-65%, no RWMA, Gr 1 DD, holosystolic MVP of posterior leaflet, mild MR, LA upper limits of normal, normal RVF, mod TR, PASP 50 mmHg   Hypercholesteremia    Hyperlipidemia    Hypertension    Hypothyroidism    MVP (mitral valve prolapse)    a. Echo 12/07/2011 - EF-65-70%, severe mitral regurgitation, moderate-severe tricuspid regurgitation, moderate pulmonary HTN, moderate pulmonic regurgitation;  b. 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   Complex valvuloplasty including artificial Gore-tex neochords x10 with 28 mm Medtronic Simuform ring  annuloplasty   Testicular hypofunction    Vitamin D deficiency     Past Surgical History:  Procedure Laterality Date   COLONOSCOPY     ETHMOIDECTOMY Left 08/13/2020   Procedure: SPENOIDECTOMY AND ETHMOIDECTOMY;  Surgeon: Drema Halon, MD;  Location: [facility] OR;  Service: ENT;  Laterality: Left;   MITRAL VALVE REPAIR Right 09/22/2020   Procedure: MINIMALLY INVASIVE MITRAL VALVE REPAIR (MVR) USING MEDTRONIC SIMUFORM SEMI-RIGID ANNULOPLASTY RING SIZE ON PUMP;   Surgeon: Purcell Nails, MD;  Location: [facility] OR;  Service: Open Heart Surgery;  Laterality: Right;   RIGHT/LEFT HEART CATH AND CORONARY ANGIOGRAPHY N/A 08/30/2020   Procedure: RIGHT/LEFT HEART CATH AND CORONARY ANGIOGRAPHY;  Surgeon: Runell Gess, MD;  Location: [facility] INVASIVE CV LAB;  Service: Cardiovascular;  Laterality: N/A;   SIGMOIDOSCOPY  02/14/2017   SINUS ENDO WITH FUSION Left 08/13/2020   Procedure: SINUS ENDO WITH FUSION;  Surgeon: Drema Halon, MD;  Location: [facility] OR;  Service: ENT;  Laterality: Left;   TEE WITHOUT CARDIOVERSION N/A 05/04/2020   Procedure: TRANSESOPHAGEAL ECHOCARDIOGRAM (TEE);  Surgeon: Little Ishikawa, MD;  Location: [facility] ENDOSCOPY;  Service: Cardiovascular;  Laterality: N/A;   TEE WITHOUT CARDIOVERSION N/A 09/22/2020   Procedure: TRANSESOPHAGEAL ECHOCARDIOGRAM (TEE);  Surgeon: Purcell Nails, MD;  Location: [facility] OR;  Service: Open Heart Surgery;  Laterality: N/A;    Current Medications: Current Meds  Medication Sig   amiodarone (PACERONE) 200 MG tablet Take 1 tablet (200 mg total) by mouth 2 (two) times daily. X 7 days, then decrease to 200 mg daily   Ascorbic Acid (VITAMIN C) 1000 MG tablet Take 1,000 mg by mouth daily.   aspirin EC 81 MG EC tablet Take 1 tablet (81 mg total) by mouth daily. Swallow whole.   Cholecalciferol (VITAMIN D) 50 MCG (2000 UT) tablet Take 2,000 Units by mouth daily.   ezetimibe (ZETIA) 10 MG tablet Take 10 mg by mouth at bedtime.   fluticasone (FLONASE) 50 MCG/ACT nasal spray Place 1 spray into both nostrils at bedtime.   levothyroxine (SYNTHROID) 75 MCG tablet Take 75 mcg by mouth at bedtime.   losartan (COZAAR) 25 MG tablet Take 1 tablet (25 mg total) by mouth daily.   rosuvastatin (CRESTOR) 40 MG tablet Take 40 mg by mouth at bedtime.   sodium chloride (OCEAN) 0.65 % SOLN nasal spray Place 1 spray into both nostrils as needed for congestion.   warfarin (COUMADIN) 2 MG tablet Take 1 tablet (2 mg total) by mouth daily at 4  PM.   [DISCONTINUED] metoprolol tartrate (LOPRESSOR) 25 MG tablet Take 0.5 tablets (12.5 mg total) by mouth 2 (two) times daily.   [DISCONTINUED] oxyCODONE (OXY IR/ROXICODONE) 5 MG immediate release tablet Take 1 tablet (5 mg total) by mouth every 4 (four) hours as needed for severe pain.     Allergies:   Contrast media [iodinated diagnostic agents], Hctz [hydrochlorothiazide], and Metoprolol succinate [metoprolol]   Social History   Socioeconomic History   Marital status: Married    Spouse name: Not on file   Number of children: Not on file   Years of education: Not on file   Highest education level: Not on file  Occupational History   Not on file  Tobacco Use   Smoking status: Former    Pack years: 0.00   Smokeless tobacco: Former    Types: Associate Professor Use: Never used  Substance and Sexual Activity   Alcohol use: Yes  Alcohol/week: 0.0 standard drinks    Comment: occasionally   Drug use: No   Sexual activity: Not on file  Other Topics Concern   Not on file  Social History Narrative   Married, owner of Facilities manager company   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 There is no history of Colon cancer, Esophageal cancer, Stomach cancer, or Rectal cancer.  ROS:   Please see the history of present illness.     All other systems reviewed and are negative.  EKGs/Labs/Other Studies Reviewed:    The following studies were reviewed today:  Echo 05/04/2020 1. Flail  P2 segment of posterior leaflet of mitral valve causing  eccentric anterior directed MR jet. Severe mitral valve regurgitation. 3D  vena contracta area 0.6 cm^2, consistent with severe MR.   2. Left ventricular ejection fraction, by estimation, is 60 to 65%. The  left ventricle has normal function.   3. Right ventricular systolic function is normal. The right ventricular  size is mildly enlarged.   4. Left atrial size was mildly dilated. No left atrial/left atrial  appendage thrombus was detected.   5. The aortic valve is tricuspid. Aortic valve regurgitation is trivial.  No aortic stenosis is present.   6. Aortic dilatation noted. There is mild dilatation of the ascending  aorta, measuring 38 mm.   EKG:  EKG is ordered today.  The ekg ordered today demonstrates normal sinus rhythm, no significant ST-T wave changes.  Recent Labs: 09/20/2020: ALT 23 09/29/2020: BUN 13; Creatinine, Ser 1.22; Hemoglobin 11.5; Magnesium 1.8; Platelets 100; Potassium 4.6; Sodium 133  Recent Lipid Panel No results found for: CHOL, TRIG, HDL, CHOLHDL, VLDL, LDLCALC, LDLDIRECT   Risk Assessment/Calculations:    CHA2DS2-VASc Score = 4  This indicates a 4.8% annual risk of stroke. The patient's score is based upon: CHF History: No HTN History: Yes Diabetes History: No Stroke History: No Vascular Disease History: Yes Age Score: 2 Gender Score: 0         Physical Exam:    VS:  BP 122/78 (BP Location: Left Arm, Patient Position: Sitting, Cuff Size: Normal)   Pulse 63   Ht 5\' 10"  (1.778 m)   Wt 208 lb 9.6 oz (94.6 kg)   SpO2 98%   BMI 29.93 kg/m     Wt Readings from Last 3 Encounters:  10/15/20 208 lb 9.6 oz (94.6 kg)  10/11/20 208 lb (94.3 kg)  10/02/20 216 lb 7.9 oz (98.2 kg)     GEN:  Well nourished, well developed in no acute distress HEENT: Normal NECK: No JVD; No carotid bruits LYMPHATICS: No lymphadenopathy CARDIAC: RRR, no murmurs, rubs, gallops RESPIRATORY:  Clear to auscultation  without rales, wheezing or rhonchi  ABDOMEN: Soft, non-tender, non-distended MUSCULOSKELETAL:  No edema; No deformity  SKIN: Warm and dry NEUROLOGIC:  Alert and oriented x 3 PSYCHIATRIC:  Normal affect   ASSESSMENT:    1. S/P minimally invasive mitral valve repair  2. PAF (paroxysmal atrial fibrillation) (HCC)   3. Essential hypertension   4. Hyperlipidemia LDL goal <100   5. Hypothyroidism, unspecified type   6. Coronary artery disease involving native coronary artery of native heart without angina pectoris    PLAN:    In order of problems listed above:  Status post minimally invasive mitral valve repair: Surgical scars well-healed.  On exam, patient does not have any heart murmur.  Postop atrial fibrillation: Currently on amiodarone, likely will discontinue amiodarone in 1 to 1330-month postop.  Hypertension: Patient is on low-dose metoprolol at this time, however he has been having dizzy spells every time he takes the medication.  I suspect his blood pressure has been low at home, will remove metoprolol for now.  Hyperlipidemia: On Crestor  Hypothyroidism: On levothyroxine  Minimal CAD: Seen on previous cath.           Medication Adjustments/Labs and Tests Ordered: Current medicines are reviewed at length with the patient today.  Concerns regarding medicines are outlined above.  Orders Placed This Encounter  Procedures   EKG 12-Lead   No orders of the defined types were placed in this encounter.   Patient Instructions  Medication Instructions:  Stop Metoprolol *If you need a refill on your cardiac medications before your next appointment, please call your pharmacy*   Lab Work: No Labs If you have labs (blood work) drawn today and your tests are completely normal, you will receive your results only by: MyChart Message (if you have MyChart) OR A paper copy in the mail If you have any lab test that is abnormal or we need to change your treatment, we will call  you to review the results.   Testing/Procedures: No Testing   Follow-Up: At [facility], you and your health needs are our priority.  As part of our continuing mission to provide you with exceptional heart care, we have created designated Provider Care Teams.  These Care Teams include your primary Cardiologist (physician) and Advanced Practice Providers (APPs -  Physician Assistants and Nurse Practitioners) who all work together to provide you with the care you need, when you need it.  We recommend signing up for the patient portal called "MyChart".  Sign up information is provided on this After Visit Summary.  MyChart is used to connect with patients for Virtual Visits (Telemedicine).  Patients are able to view lab/test results, encounter notes, upcoming appointments, etc.  Non-urgent messages can be sent to your provider as well.   To learn more about what you can do with MyChart, go to [email or web address].    Your next appointment:   November 16, 2020 10:45 AM  The format for your next appointment:   In Person  Provider:   Nanetta BattyJonathan Berry, MD       Signed, Azalee CourseHao Rory Montel, [place]  10/17/2020 11:08 PM    [facility]

## 2020-10-17 ENCOUNTER — Encounter: Payer: Self-pay | Admitting: Physician Assistant

## 2020-10-18 ENCOUNTER — Telehealth: Payer: Self-pay | Admitting: Cardiovascular Disease

## 2020-10-18 NOTE — Telephone Encounter (Signed)
Spoke with pt regarding issues with blood pressure. Pt states that he had a rough night last night with sinus pain and pressure and didn't sleep well. Pt states that he slept in and would normally take his medication by around 10am but didn't get them in until 11am this morning. Pt was not feeling well and took his blood pressure with results 175/97. Pt called the hospital and was instructed by them to call EMS. Pt states that his pressure came down to 166/97, HR 77.  Pt states that a little while later after relaxing came down to 140/84 at around 12pm.  Pt has been taken off most medication with the exception of losartan 25mg  daily.  Pt states that his blood pressure usually runs 120-30/75-80. Explained to pt that this could be an isolated incident. Will send to Dr. 09-27-1969 to advise.

## 2020-10-18 NOTE — Telephone Encounter (Signed)
Pt c/o BP issue: STAT if pt c/o blurred vision, one-sided weakness or slurred speech  1. What are your last 5 BP readings? 166/97; 77  2. Are you having any other symptoms (ex. Dizziness, headache, blurred vision, passed out)? Lightheaded  3. What is your BP issue? Unusually high BP. Patient made mention that he fills dizzy and weird. Advised patient's wife to call 911

## 2020-10-19 MED ORDER — LOSARTAN POTASSIUM 50 MG PO TABS: 50.0000 mg | ORAL_TABLET | Freq: Every day | ORAL | 3 refills | Status: DC

## 2020-10-19 NOTE — Telephone Encounter (Signed)
Spoke with pt regarding changes to medication. Explained increase of losartan to 50mg  daily. Instructed pt to keep a blood pressure log and bring with him to his next office visit with Dr. .  Pt took his blood pressure this morning after breakfast but before his losartan with results of 150/80.  Pt states that he took his meds shortly after that and he will re-check his blood pressure about 2 hours after meds to see how his pressure is.   Pt also questions what his activity levels can be at this time. Pt is 4-5 weeks out from surgery. Instructed pt to defer this question to the [facility] office to better instruct him. Pt verbalizes understanding.  Pt brings up another concern regarding a headache that he describes on the left side of his face that has almost constantly been with him since surgery. Pt states that he felt some relief once he was told to stop taking metoprolol, however pt states that he feels like the headache holds him back from being able to better recover from his surgery. Pt does not believe that these headaches are blood pressure related, he states that his pressure can be in a normal range and he will still have the headache. Unclear of reason for this headache. Will let Dr. Allyson Sabal advise. Also encouraged pt to bring this issue up when asking questions of surgeons office. Pt verbalizes understanding.

## 2020-10-19 NOTE — Telephone Encounter (Signed)
Patient called back and would like clarification regarding the medication changes. His main concern was whether or not a new rx would be sent to his pharmacy to compensate for taking the additional medication. Please assist

## 2020-10-22 ENCOUNTER — Other Ambulatory Visit: Payer: Self-pay

## 2020-10-22 ENCOUNTER — Emergency Department (HOSPITAL_BASED_OUTPATIENT_CLINIC_OR_DEPARTMENT_OTHER): Payer: 59

## 2020-10-22 ENCOUNTER — Inpatient Hospital Stay (HOSPITAL_BASED_OUTPATIENT_CLINIC_OR_DEPARTMENT_OTHER)
Admission: EM | Admit: 2020-10-22 | Discharge: 2020-10-24 | DRG: 299 | Disposition: A | Discharge status: Home / Self Care | Payer: 59 | Attending: Family Medicine | Admitting: Family Medicine

## 2020-10-22 ENCOUNTER — Encounter (HOSPITAL_BASED_OUTPATIENT_CLINIC_OR_DEPARTMENT_OTHER): Payer: Self-pay | Admitting: *Deleted

## 2020-10-22 DIAGNOSIS — I82411 Acute embolism and thrombosis of right femoral vein: Secondary | ICD-10-CM | POA: Diagnosis not present

## 2020-10-22 DIAGNOSIS — Z79899 Other long term (current) drug therapy: Secondary | ICD-10-CM

## 2020-10-22 DIAGNOSIS — I2699 Other pulmonary embolism without acute cor pulmonale: Secondary | ICD-10-CM | POA: Diagnosis present

## 2020-10-22 DIAGNOSIS — F17220 Nicotine dependence, chewing tobacco, uncomplicated: Secondary | ICD-10-CM | POA: Diagnosis present

## 2020-10-22 DIAGNOSIS — Z952 Presence of prosthetic heart valve: Secondary | ICD-10-CM

## 2020-10-22 DIAGNOSIS — Z8249 Family history of ischemic heart disease and other diseases of the circulatory system: Secondary | ICD-10-CM

## 2020-10-22 DIAGNOSIS — R079 Chest pain, unspecified: Secondary | ICD-10-CM | POA: Diagnosis not present

## 2020-10-22 DIAGNOSIS — I480 Paroxysmal atrial fibrillation: Secondary | ICD-10-CM | POA: Diagnosis present

## 2020-10-22 DIAGNOSIS — Z91041 Radiographic dye allergy status: Secondary | ICD-10-CM

## 2020-10-22 DIAGNOSIS — R791 Abnormal coagulation profile: Secondary | ICD-10-CM | POA: Diagnosis present

## 2020-10-22 DIAGNOSIS — E785 Hyperlipidemia, unspecified: Secondary | ICD-10-CM | POA: Diagnosis present

## 2020-10-22 DIAGNOSIS — Z7901 Long term (current) use of anticoagulants: Secondary | ICD-10-CM

## 2020-10-22 DIAGNOSIS — Z20822 Contact with and (suspected) exposure to covid-19: Secondary | ICD-10-CM | POA: Diagnosis present

## 2020-10-22 DIAGNOSIS — I10 Essential (primary) hypertension: Secondary | ICD-10-CM | POA: Diagnosis present

## 2020-10-22 DIAGNOSIS — Z79890 Hormone replacement therapy (postmenopausal): Secondary | ICD-10-CM

## 2020-10-22 DIAGNOSIS — Z823 Family history of stroke: Secondary | ICD-10-CM

## 2020-10-22 DIAGNOSIS — Z7982 Long term (current) use of aspirin: Secondary | ICD-10-CM

## 2020-10-22 DIAGNOSIS — E039 Hypothyroidism, unspecified: Secondary | ICD-10-CM | POA: Diagnosis present

## 2020-10-22 LAB — BASIC METABOLIC PANEL
Anion gap: 7 (ref 5–15)
BUN: 15 mg/dL (ref 8–23)
CO2: 26 mmol/L (ref 22–32)
Calcium: 8.5 mg/dL — ABNORMAL LOW (ref 8.9–10.3)
Chloride: 103 mmol/L (ref 98–111)
Creatinine, Ser: 1.25 mg/dL — ABNORMAL HIGH (ref 0.61–1.24)
GFR, Estimated: 59 mL/min — ABNORMAL LOW (ref >60)
Glucose, Bld: 125 mg/dL — ABNORMAL HIGH (ref 70–99)
Potassium: 4 mmol/L (ref 3.5–5.1)
Sodium: 136 mmol/L (ref 135–145)

## 2020-10-22 LAB — PROTIME-INR
INR: 1.7 — ABNORMAL HIGH (ref 0.8–1.2)
Prothrombin Time: 19.9 seconds — ABNORMAL HIGH (ref 11.4–15.2)

## 2020-10-22 LAB — CBC WITH DIFFERENTIAL/PLATELET
Abs Immature Granulocytes: 0.02 10*3/uL (ref 0.00–0.07)
Basophils Absolute: 0 10*3/uL (ref 0.0–0.1)
Basophils Relative: 0 %
Eosinophils Absolute: 0.1 10*3/uL (ref 0.0–0.5)
Eosinophils Relative: 2 %
HCT: 37.6 % — ABNORMAL LOW (ref 39.0–52.0)
Hemoglobin: 12.7 g/dL — ABNORMAL LOW (ref 13.0–17.0)
Immature Granulocytes: 0 %
Lymphocytes Relative: 14 %
Lymphs Abs: 0.9 10*3/uL (ref 0.7–4.0)
MCH: 31.9 pg (ref 26.0–34.0)
MCHC: 33.8 g/dL (ref 30.0–36.0)
MCV: 94.5 fL (ref 80.0–100.0)
Monocytes Absolute: 0.5 10*3/uL (ref 0.1–1.0)
Monocytes Relative: 7 %
Neutro Abs: 5.2 10*3/uL (ref 1.7–7.7)
Neutrophils Relative %: 77 %
Platelets: 127 10*3/uL — ABNORMAL LOW (ref 150–400)
RBC: 3.98 MIL/uL — ABNORMAL LOW (ref 4.22–5.81)
RDW: 14.3 % (ref 11.5–15.5)
WBC: 6.8 10*3/uL (ref 4.0–10.5)
nRBC: 0 % (ref 0.0–0.2)

## 2020-10-22 LAB — MAGNESIUM: Magnesium: 2.1 mg/dL (ref 1.7–2.4)

## 2020-10-22 LAB — RESP PANEL BY RT-PCR (FLU A&B, COVID) ARPGX2
Influenza A by PCR: NEGATIVE
Influenza B by PCR: NEGATIVE
SARS Coronavirus 2 by RT PCR: NEGATIVE

## 2020-10-22 LAB — D-DIMER, QUANTITATIVE: D-Dimer, Quant: 9.13 ug/mL-FEU — ABNORMAL HIGH (ref 0.00–0.50)

## 2020-10-22 LAB — TROPONIN I (HIGH SENSITIVITY)
Troponin I (High Sensitivity): 10 ng/L (ref <18)
Troponin I (High Sensitivity): 17 ng/L (ref <18)

## 2020-10-22 MED ORDER — HEPARIN (PORCINE) 25000 UT/250ML-% IV SOLN
1350.0000 [IU]/h | INTRAVENOUS | Status: AC
  Administered 2020-10-22 – 2020-10-23 (×2): 1350 [IU]/h via INTRAVENOUS
  Filled 2020-10-22 (×2): qty 250

## 2020-10-22 NOTE — ED Provider Notes (Signed)
[facility] EMERGENCY DEPARTMENT Provider Note   CSN: [id number] Arrival date & time: 10/22/20  1934     History Chief Complaint  Patient presents with   Palpitations    Chad Berry is a 80 y.o. male.  Patient is a 80 year old male who is status post a minimally invasive mitral valve repair by Dr. Barry Dienes on Sep 22, 2020.  This was complicated by a-fib which converted to a sinus rhythm.  He was started on amiodarone and coumadin. He said that he was doing well until today.  He states he felt well earlier in the day.  He was a little more active and he decided dry for the first time.  He drove 1 hour to his office.  He ate lunch.  As he was driving home, he felt some pounding in his chest and some left-sided chest pain.  He also noticed of pressure feeling in his head and neck.  He says he went home and laid down and felt a little better but then it came back again.  He checked his blood pressure the second time and noticed it was elevated at 177/90.  He said his heart rate was in the 70s.  He continues to have a little bit of pain in the left side of his chest.  Its not pleuritic.  Not exertional.  Not worse with movement.  He has a little bit of a headache still but no pain in his neck.  He has been having some swelling of his right leg for about the last week.  No shortness of breath.  No fevers.      Past Medical History:  Diagnosis Date   Aortic atherosclerosis (HCC)    Bradycardia    while on BB with HR to the 40s   Carotid stenosis    CAROTID DOPPLER, 12/07/2011 - Mild arthrosclerotic changes, no high-grade stenosis   Cataract    rt. eye   Clostridium difficile infection 05/2019   Diverticulitis    Heart murmur    History of echocardiogram    Echo 9/16:  EF 60-65%, no RWMA, Gr 1 DD, holosystolic MVP of posterior leaflet, mild MR, LA upper limits of normal, normal RVF, mod TR, PASP 50 mmHg   Hypercholesteremia    Hyperlipidemia    Hypertension    Hypothyroidism    MVP  (mitral valve prolapse)    a. Echo 12/07/2011 - EF-65-70%, severe mitral regurgitation, moderate-severe tricuspid regurgitation, moderate pulmonary HTN, moderate pulmonic regurgitation;  b. 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   Complex valvuloplasty including artificial Gore-tex neochords x10 with 28 mm Medtronic Simuform ring annuloplasty   Testicular hypofunction    Vitamin D deficiency     Patient Active Problem List   Diagnosis Date Noted   Chest pain 10/22/2020   Long term (current) use of anticoagulants 09/30/2020   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 bil 39% 09/25/2013   Occlusion and stenosis of unspecified carotid artery 09/25/2013   Rapid palpitations    Mitral valve prolapse 12/23/2012   Essential hypertension 12/23/2012   Hyperlipidemia 12/23/2012    Past Surgical History:  Procedure Laterality Date   COLONOSCOPY     ETHMOIDECTOMY Left 08/13/2020   Procedure: SPENOIDECTOMY AND ETHMOIDECTOMY;  Surgeon: Drema Halon, MD;  Location: [facility] OR;  Service: ENT;  Laterality: Left;   MITRAL VALVE REPAIR Right 09/22/2020   Procedure: MINIMALLY INVASIVE MITRAL VALVE REPAIR (MVR) USING MEDTRONIC SIMUFORM SEMI-RIGID ANNULOPLASTY RING  SIZE ON PUMP;  Surgeon: Purcell Nails, MD;  Location: [facility] OR;  Service: Open Heart Surgery;  Laterality: Right;   RIGHT/LEFT HEART CATH AND CORONARY ANGIOGRAPHY N/A 08/30/2020   Procedure: RIGHT/LEFT HEART CATH AND CORONARY ANGIOGRAPHY;  Surgeon: Runell Gess, MD;  Location: [facility] INVASIVE CV LAB;  Service: Cardiovascular;  Laterality: N/A;   SIGMOIDOSCOPY  02/14/2017   SINUS ENDO WITH FUSION Left 08/13/2020   Procedure: SINUS ENDO WITH FUSION;  Surgeon: Drema Halon, MD;  Location: [facility] OR;  Service: ENT;  Laterality: Left;   TEE WITHOUT CARDIOVERSION N/A 05/04/2020   Procedure: TRANSESOPHAGEAL ECHOCARDIOGRAM (TEE);  Surgeon: Little Ishikawa, MD;  Location: [facility] ENDOSCOPY;  Service: Cardiovascular;  Laterality: N/A;   TEE WITHOUT CARDIOVERSION N/A 09/22/2020   Procedure: TRANSESOPHAGEAL ECHOCARDIOGRAM (TEE);  Surgeon: Purcell Nails, MD;  Location: [facility] OR;  Service: Open Heart Surgery;  Laterality: N/A;       Family History  Problem Relation Age of Onset   Stroke Mother 45   Heart failure Father    Diverticulitis Sister    Stroke Brother 18   Heart attack Brother    Colon cancer Neg Hx    Esophageal cancer Neg Hx    Stomach cancer Neg Hx    Rectal cancer Neg Hx     Social History   Tobacco Use   Smoking status: Former    Pack years: 0.00   Smokeless tobacco: Former    Types: Associate Professor Use: Never used  Substance Use Topics   Alcohol use: Yes    Alcohol/week: 0.0 standard drinks    Comment: occasionally   Drug use: No    Home Medications Prior to Admission medications   Medication Sig Start Date End Date Taking? Authorizing Provider  amiodarone (PACERONE) 200 MG tablet Take 1 tablet (200 mg total) by mouth 2 (two) times daily. X 7 days, then decrease to 200 mg daily 09/29/20   Barrett, Rae Roam, PA-C  Ascorbic Acid (VITAMIN C) 1000 MG tablet Take 1,000 mg by mouth daily.    [provider]  aspirin EC 81 MG EC tablet Take 1 tablet  (81 mg total) by mouth daily. Swallow whole. 09/29/20   Barrett, Rae Roam, PA-C  Cholecalciferol (VITAMIN D) 50 MCG (2000 UT) tablet Take 2,000 Units by mouth daily.    [provider]  ezetimibe (ZETIA) 10 MG tablet Take 10 mg by mouth at bedtime.    [provider]  fluticasone (FLONASE) 50 MCG/ACT nasal spray Place 1 spray into both nostrils at bedtime.    [provider]  levothyroxine (SYNTHROID) 75 MCG tablet Take 75 mcg by mouth at bedtime.    [provider]  losartan (COZAAR) 50 MG tablet Take 1 tablet (50 mg total) by mouth  daily. 10/19/20 10/19/21  Runell Gess, MD  rosuvastatin (CRESTOR) 40 MG tablet Take 40 mg by mouth at bedtime.    [provider]  sodium chloride (OCEAN) 0.65 % SOLN nasal spray Place 1 spray into both nostrils as needed for congestion.    [provider]  warfarin (COUMADIN) 2 MG tablet Take 1 tablet (2 mg total) by mouth daily at 4 PM. 09/29/20   Barrett, Erin R, PA-C    Allergies    Contrast media [iodinated diagnostic agents], Hctz [hydrochlorothiazide], and Metoprolol succinate [metoprolol]  Review of Systems   Review of Systems  Constitutional:  Positive for fatigue. Negative for chills, diaphoresis and fever.  HENT:  Negative for congestion, rhinorrhea and sneezing.   Eyes: Negative.   Respiratory:  Negative for cough, chest tightness and shortness of breath.   Cardiovascular:  Positive for chest pain and leg swelling.  Gastrointestinal:  Negative for abdominal pain, blood in stool, diarrhea, nausea and vomiting.  Genitourinary:  Negative for difficulty urinating, flank pain, frequency and hematuria.  Musculoskeletal:  Negative for arthralgias and back pain.  Skin:  Negative for rash.  Neurological:  Positive for headaches. Negative for dizziness, speech difficulty, weakness and numbness.   Physical Exam Updated Vital Signs BP (!) 164/83   Pulse 67   Temp 98.2 F (36.8 C) (Oral)   Resp 15   Ht  5\' 10"  (1.778 m)   Wt 94.6 kg   SpO2 96%   BMI 29.92 kg/m   Physical Exam Constitutional:      Appearance: He is well-developed.  HENT:     Head: Normocephalic and atraumatic.  Eyes:     Pupils: Pupils are equal, round, and reactive to light.  Cardiovascular:     Rate and Rhythm: Normal rate and regular rhythm.     Heart sounds: Normal heart sounds.     Comments: Healing incision to his right chest wall, no drainage or signs of infection Pulmonary:     Effort: Pulmonary effort is normal. No respiratory distress.     Breath sounds: Normal breath sounds. No wheezing or rales.  Chest:     Chest wall: No tenderness.  Abdominal:     General: Bowel sounds are normal.     Palpations: Abdomen is soft.     Tenderness: There is no abdominal tenderness. There is no guarding or rebound.  Musculoskeletal:        General: Normal range of motion.     Cervical back: Normal range of motion and neck supple.     Comments: 1+ edema in his right lower extremity , slightly more increased in the left lower extremity  Lymphadenopathy:     Cervical: No cervical adenopathy.  Skin:    General: Skin is warm and dry.     Findings: No rash.  Neurological:     Mental Status: He is alert and oriented to person, place, and time.    ED Results / Procedures / Treatments   Labs (all labs ordered are listed, but only abnormal results are displayed) Labs Reviewed  BASIC METABOLIC PANEL - Abnormal; Notable for the following components:      Result Value   Glucose, Bld 125 (*)    Creatinine, Ser 1.25 (*)    Calcium 8.5 (*)    GFR, Estimated 59 (*)    All other components within normal limits  CBC WITH DIFFERENTIAL/PLATELET - Abnormal; Notable for the following components:   RBC 3.98 (*)    Hemoglobin 12.7 (*)  HCT 37.6 (*)    Platelets 127 (*)    All other components within normal limits  PROTIME-INR - Abnormal; Notable for the following components:   Prothrombin Time 19.9 (*)    INR 1.7 (*)     All other components within normal limits  MAGNESIUM  D-DIMER, QUANTITATIVE  TROPONIN I (HIGH SENSITIVITY)  TROPONIN I (HIGH SENSITIVITY)    EKG EKG Interpretation  Date/Time:  Friday October 22 2020 19:45:13 EDT Ventricular Rate:  82 PR Interval:  204 QRS Duration: 138 QT Interval:  412 QTC Calculation: 481 R Axis:   46 Text Interpretation: Normal sinus rhythm Left ventricular hypertrophy with QRS widening and repolarization abnormality ( Cornell product ) Abnormal ECG similar to prior EKG Confirmed by Rolan BuccoBelfi, Kiri Hinderliter [id number]) on 10/22/2020 7:48:49 PM  Radiology DG Chest 2 View  Result Date: 10/22/2020 CLINICAL DATA:  Palpitations. EXAM: CHEST - 2 VIEW COMPARISON:  October 11, 2020 FINDINGS: Stable linear scarring is noted within the mid right lung. The left lung is clear. A small, stable right pleural effusion is seen. No pneumothorax is identified. The heart size and mediastinal contours are within normal limits. An artificial mitral valve is noted. The visualized skeletal structures are unremarkable. IMPRESSION: 1. Stable linear scarring within the mid right lung. 2. Small, stable right pleural effusion. Electronically Signed   By: Aram Candelahaddeus  Houston M.D.   On: 10/22/2020 21:47   CT Head Wo Contrast  Result Date: 10/22/2020 CLINICAL DATA:  Palpitations. EXAM: CT HEAD WITHOUT CONTRAST TECHNIQUE: Contiguous axial images were obtained from the base of the skull through the vertex without intravenous contrast. COMPARISON:  June 28, 2020 FINDINGS: Brain: There is mild cerebral atrophy with widening of the extra-axial spaces and ventricular dilatation. There are areas of decreased attenuation within the white matter tracts of the supratentorial brain, consistent with microvascular disease changes. Vascular: No hyperdense vessel or unexpected calcification. Skull: Normal. Negative for fracture or focal lesion. Sinuses/Orbits: No acute finding. Other: None. IMPRESSION: 1. Generalized cerebral  atrophy. 2. No acute intracranial abnormality. Electronically Signed   By: Aram Candelahaddeus  Houston M.D.   On: 10/22/2020 21:56   US Venous Img Lower Right (DVT Study)  Result Date: 10/22/2020 CLINICAL DATA:  Right leg swelling. EXAM: RIGHT LOWER EXTREMITY VENOUS DOPPLER ULTRASOUND TECHNIQUE: Gray-scale sonography with compression, as well as color and duplex ultrasound, were performed to evaluate the deep venous system(s) from the level of the common femoral vein through the popliteal and proximal calf veins. COMPARISON:  None. FINDINGS: VENOUS Abnormal compressibility of the RIGHT common femoral, superficial femoral, and popliteal veins. Visualized portions of the RIGHT profunda femoral vein and RIGHT great saphenous vein are also abnormal. Filling defects suggestive of DVT are seen on grayscale and color Doppler imaging. Doppler waveforms show abnormal direction of venous flow, abnormal respiratory plasticity and abnormal response to augmentation. The visualized RIGHT calf veins are unremarkable. Limited views of the contralateral common femoral vein are unremarkable. OTHER None. Limitations: none IMPRESSION: Findings consistent with occlusive RIGHT lower extremity DVT from the RIGHT common femoral vein to the RIGHT popliteal vein. Electronically Signed   By: Aram Candelahaddeus  Houston M.D.   On: 10/22/2020 21:44    Procedures Procedures   Medications Ordered in ED Medications  heparin ADULT infusion 100 units/mL (25000 units/25350mL) (has no administration in time range)    ED Course  I have reviewed the triage vital signs and the nursing notes.  Pertinent labs & imaging results that were available during my care of the patient were  reviewed by me and considered in my medical decision making (see chart for details).    MDM Rules/Calculators/A&P                          Patient is a 80 year old male who presents with an episode of chest pain associated with heart pounding and headache this afternoon.  He  does have some very mild ongoing chest pain.  No shortness of breath.  He has some right leg swelling.  In evaluation of this, it was found that he has a fairly extensive DVT.  His EKG does not show any ischemic changes.  His troponin is negative.  His creatinine is elevated but his other labs are nonconcerning.  His chest x-ray is clear without evidence of pneumonia or pulmonary edema.  I cannot completely exclude a PE.  He has a severe contrast allergy and per notes will require a 13-hour prep prior to contrast.  His INR is subtherapeutic.  We will start heparin.  I discussed with cardiology, Dr. Kateri Mc.  He did not feel that his chest pain was a concerning finding given his recent surgery.  However I do feel that given that we cannot exclude PE, he should be admitted for anticoagulation and possibly a VQ scan or be premedicated for CT scan tomorrow.  I spoke with Dr. Robb Matar who will admit the patient.  He was started on heparin. Final Clinical Impression(s) / ED Diagnoses Final diagnoses:  Chest pain, unspecified type  Acute deep vein thrombosis (DVT) of femoral vein of right lower extremity [facility])    Rx / DC Orders ED Discharge Orders     None        Rolan Bucco, MD 10/22/20 2305

## 2020-10-22 NOTE — ED Triage Notes (Signed)
Palpitations. Mitral valve repair a month ago. Headache this evening after dinner. He took a nap and the headache went away. His BP was elevated when he woke his nap. EKG at triage.

## 2020-10-22 NOTE — Progress Notes (Signed)
ANTICOAGULATION CONSULT NOTE - Initial Consult  Pharmacy Consult for heparin Indication: DVT  Allergies  Allergen Reactions   Contrast Media [Iodinated Diagnostic Agents] Hives, Nausea And Vomiting and Cough    Pt will need a 13 hour prep before any contrast per Dr. Paulina Fusi (05/14/19)   Hctz [Hydrochlorothiazide] Other (See Comments)    palpitations   Metoprolol Succinate [Metoprolol] Other (See Comments)    Bradycardia      Patient Measurements: Height: 5\' 10"  (177.8 cm) Weight: 94.6 kg (208 lb 8.9 oz) IBW/kg (Calculated) : 73 Heparin Dosing Weight: 92.3 kg  Vital Signs: Temp: 98.2 F (36.8 C) (06/24 1942) Temp Source: Oral (06/24 1942) BP: 164/83 (06/24 2200) Pulse Rate: 67 (06/24 2200)  Labs: Recent Labs    10/22/20 2055  HGB 12.7*  HCT 37.6*  PLT 127*  LABPROT 19.9*  INR 1.7*  CREATININE 1.25*  TROPONINIHS 10    Estimated Creatinine Clearance: 57.1 mL/min (A) (by C-G formula based on SCr of 1.25 mg/dL (H)).   Medical History: Past Medical History:  Diagnosis Date   Aortic atherosclerosis (HCC)    Bradycardia    while on BB with HR to the 40s   Carotid stenosis    CAROTID DOPPLER, 12/07/2011 - Mild arthrosclerotic changes, no high-grade stenosis   Cataract    rt. eye   Clostridium difficile infection 05/2019   Diverticulitis    Heart murmur    History of echocardiogram    Echo 9/16:  EF 60-65%, no RWMA, Gr 1 DD, holosystolic MVP of posterior leaflet, mild MR, LA upper limits of normal, normal RVF, mod TR, PASP 50 mmHg   Hypercholesteremia    Hyperlipidemia    Hypertension    Hypothyroidism    MVP (mitral valve prolapse)    a. Echo 12/07/2011 - EF-65-70%, severe mitral regurgitation, moderate-severe tricuspid regurgitation, moderate pulmonary HTN, moderate pulmonic regurgitation;  b. 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  INR 1.7 Hg 12.7, PTLC 127   Goal of Therapy:  Heparin level 0.3-0.7 units/ml Monitor platelets by anticoagulation protocol: Yes   Plan:  Heparin drip at 1350 units/hr, no bolus Check heparin level 6-8 hours after start Daily HL and CBC Monitor for bleeding complications   [address] [place] 10/22/2020,10:34 PM

## 2020-10-23 ENCOUNTER — Encounter (HOSPITAL_COMMUNITY): Payer: Self-pay | Admitting: Internal Medicine

## 2020-10-23 ENCOUNTER — Other Ambulatory Visit: Payer: Self-pay

## 2020-10-23 DIAGNOSIS — E785 Hyperlipidemia, unspecified: Secondary | ICD-10-CM | POA: Diagnosis present

## 2020-10-23 DIAGNOSIS — I82401 Acute embolism and thrombosis of unspecified deep veins of right lower extremity: Secondary | ICD-10-CM | POA: Diagnosis not present

## 2020-10-23 DIAGNOSIS — I10 Essential (primary) hypertension: Secondary | ICD-10-CM | POA: Diagnosis present

## 2020-10-23 DIAGNOSIS — F17220 Nicotine dependence, chewing tobacco, uncomplicated: Secondary | ICD-10-CM | POA: Diagnosis present

## 2020-10-23 DIAGNOSIS — Z79899 Other long term (current) drug therapy: Secondary | ICD-10-CM | POA: Diagnosis not present

## 2020-10-23 DIAGNOSIS — Z823 Family history of stroke: Secondary | ICD-10-CM | POA: Diagnosis not present

## 2020-10-23 DIAGNOSIS — I82411 Acute embolism and thrombosis of right femoral vein: Secondary | ICD-10-CM | POA: Diagnosis present

## 2020-10-23 DIAGNOSIS — R791 Abnormal coagulation profile: Secondary | ICD-10-CM | POA: Diagnosis present

## 2020-10-23 DIAGNOSIS — E039 Hypothyroidism, unspecified: Secondary | ICD-10-CM | POA: Diagnosis present

## 2020-10-23 DIAGNOSIS — Z7982 Long term (current) use of aspirin: Secondary | ICD-10-CM | POA: Diagnosis not present

## 2020-10-23 DIAGNOSIS — I824Y1 Acute embolism and thrombosis of unspecified deep veins of right proximal lower extremity: Secondary | ICD-10-CM | POA: Diagnosis not present

## 2020-10-23 DIAGNOSIS — Z7901 Long term (current) use of anticoagulants: Secondary | ICD-10-CM | POA: Diagnosis not present

## 2020-10-23 DIAGNOSIS — Z20822 Contact with and (suspected) exposure to covid-19: Secondary | ICD-10-CM | POA: Diagnosis present

## 2020-10-23 DIAGNOSIS — I480 Paroxysmal atrial fibrillation: Secondary | ICD-10-CM | POA: Diagnosis present

## 2020-10-23 DIAGNOSIS — Z8249 Family history of ischemic heart disease and other diseases of the circulatory system: Secondary | ICD-10-CM | POA: Diagnosis not present

## 2020-10-23 DIAGNOSIS — I2699 Other pulmonary embolism without acute cor pulmonale: Secondary | ICD-10-CM | POA: Diagnosis present

## 2020-10-23 DIAGNOSIS — Z79890 Hormone replacement therapy (postmenopausal): Secondary | ICD-10-CM | POA: Diagnosis not present

## 2020-10-23 DIAGNOSIS — Z91041 Radiographic dye allergy status: Secondary | ICD-10-CM | POA: Diagnosis not present

## 2020-10-23 DIAGNOSIS — R079 Chest pain, unspecified: Secondary | ICD-10-CM

## 2020-10-23 DIAGNOSIS — Z952 Presence of prosthetic heart valve: Secondary | ICD-10-CM | POA: Diagnosis not present

## 2020-10-23 LAB — CBC
HCT: 34.5 % — ABNORMAL LOW (ref 39.0–52.0)
Hemoglobin: 11.8 g/dL — ABNORMAL LOW (ref 13.0–17.0)
MCH: 31.8 pg (ref 26.0–34.0)
MCHC: 34.2 g/dL (ref 30.0–36.0)
MCV: 93 fL (ref 80.0–100.0)
Platelets: 114 10*3/uL — ABNORMAL LOW (ref 150–400)
RBC: 3.71 MIL/uL — ABNORMAL LOW (ref 4.22–5.81)
RDW: 14 % (ref 11.5–15.5)
WBC: 5.7 10*3/uL (ref 4.0–10.5)
nRBC: 0 % (ref 0.0–0.2)

## 2020-10-23 LAB — BASIC METABOLIC PANEL
Anion gap: 9 (ref 5–15)
BUN: 11 mg/dL (ref 8–23)
CO2: 23 mmol/L (ref 22–32)
Calcium: 8.8 mg/dL — ABNORMAL LOW (ref 8.9–10.3)
Chloride: 105 mmol/L (ref 98–111)
Creatinine, Ser: 1.12 mg/dL (ref 0.61–1.24)
GFR, Estimated: >60 mL/min (ref >60)
Glucose, Bld: 104 mg/dL — ABNORMAL HIGH (ref 70–99)
Potassium: 4.2 mmol/L (ref 3.5–5.1)
Sodium: 137 mmol/L (ref 135–145)

## 2020-10-23 LAB — MRSA NEXT GEN BY PCR, NASAL: MRSA by PCR Next Gen: NOT DETECTED

## 2020-10-23 LAB — PROTIME-INR
INR: 1.7 — ABNORMAL HIGH (ref 0.8–1.2)
Prothrombin Time: 20.1 seconds — ABNORMAL HIGH (ref 11.4–15.2)

## 2020-10-23 LAB — MAGNESIUM: Magnesium: 1.9 mg/dL (ref 1.7–2.4)

## 2020-10-23 LAB — PHOSPHORUS: Phosphorus: 3.2 mg/dL (ref 2.5–4.6)

## 2020-10-23 LAB — HEPARIN LEVEL (UNFRACTIONATED): Heparin Unfractionated: 0.48 IU/mL (ref 0.30–0.70)

## 2020-10-23 MED ORDER — POLYETHYLENE GLYCOL 3350 17 G PO PACK
17.0000 g | PACK | Freq: Every day | ORAL | Status: DC | PRN
  Administered 2020-10-23: 17 g via ORAL
  Filled 2020-10-23: qty 1

## 2020-10-23 MED ORDER — ACETAMINOPHEN 325 MG PO TABS: 650.0000 mg | ORAL_TABLET | Freq: Four times a day (QID) | ORAL | Status: DC | PRN

## 2020-10-23 MED ORDER — MELATONIN 5 MG PO TABS
5.0000 mg | ORAL_TABLET | Freq: Every evening | ORAL | Status: DC | PRN
  Administered 2020-10-23 (×2): 5 mg via ORAL
  Filled 2020-10-23 (×2): qty 1

## 2020-10-23 MED ORDER — WARFARIN SODIUM 4 MG PO TABS: 4.0000 mg | ORAL_TABLET | Freq: Once | ORAL | Status: DC

## 2020-10-23 MED ORDER — ONDANSETRON HCL 4 MG/2ML IJ SOLN: 4.0000 mg | Freq: Four times a day (QID) | INTRAMUSCULAR | Status: DC | PRN

## 2020-10-23 MED ORDER — EZETIMIBE 10 MG PO TABS
10.0000 mg | ORAL_TABLET | Freq: Every day | ORAL | Status: DC
  Administered 2020-10-23: 10 mg via ORAL
  Filled 2020-10-23: qty 1

## 2020-10-23 MED ORDER — LOSARTAN POTASSIUM 50 MG PO TABS: 50.0000 mg | ORAL_TABLET | Freq: Every day | ORAL | Status: DC

## 2020-10-23 MED ORDER — WARFARIN - PHARMACIST DOSING INPATIENT: Freq: Every day | Status: DC

## 2020-10-23 MED ORDER — AMIODARONE HCL 200 MG PO TABS
200.0000 mg | ORAL_TABLET | Freq: Two times a day (BID) | ORAL | Status: DC
  Administered 2020-10-23 – 2020-10-24 (×3): 200 mg via ORAL
  Filled 2020-10-23 (×3): qty 1

## 2020-10-23 MED ORDER — APIXABAN 5 MG PO TABS
10.0000 mg | ORAL_TABLET | Freq: Two times a day (BID) | ORAL | Status: DC
  Administered 2020-10-23 – 2020-10-24 (×2): 10 mg via ORAL
  Filled 2020-10-23 (×2): qty 2

## 2020-10-23 MED ORDER — MORPHINE SULFATE (PF) 2 MG/ML IV SOLN: 2.0000 mg | INTRAVENOUS | Status: DC | PRN

## 2020-10-23 MED ORDER — ASPIRIN 81 MG PO CHEW
81.0000 mg | CHEWABLE_TABLET | Freq: Every day | ORAL | Status: DC
  Administered 2020-10-23 – 2020-10-24 (×2): 81 mg via ORAL
  Filled 2020-10-23 (×2): qty 1

## 2020-10-23 MED ORDER — APIXABAN 5 MG PO TABS: 5.0000 mg | ORAL_TABLET | Freq: Two times a day (BID) | ORAL | Status: DC

## 2020-10-23 MED ORDER — OXYCODONE HCL 5 MG PO TABS: 5.0000 mg | ORAL_TABLET | Freq: Four times a day (QID) | ORAL | Status: DC | PRN

## 2020-10-23 MED ORDER — LEVOTHYROXINE SODIUM 75 MCG PO TABS
75.0000 ug | ORAL_TABLET | Freq: Every day | ORAL | Status: DC
  Administered 2020-10-23: 75 ug via ORAL
  Filled 2020-10-23: qty 1

## 2020-10-23 MED ORDER — ROSUVASTATIN CALCIUM 20 MG PO TABS
40.0000 mg | ORAL_TABLET | Freq: Every day | ORAL | Status: DC
  Administered 2020-10-23: 40 mg via ORAL
  Filled 2020-10-23: qty 2

## 2020-10-23 MED ORDER — HYDRALAZINE HCL 20 MG/ML IJ SOLN: 5.0000 mg | Freq: Four times a day (QID) | INTRAMUSCULAR | Status: DC | PRN

## 2020-10-23 MED ORDER — LOSARTAN POTASSIUM 50 MG PO TABS
100.0000 mg | ORAL_TABLET | Freq: Every day | ORAL | Status: DC
  Administered 2020-10-23 – 2020-10-24 (×2): 100 mg via ORAL
  Filled 2020-10-23 (×2): qty 2

## 2020-10-23 NOTE — Progress Notes (Signed)
PROGRESS NOTE    Chad Berry  KGY:[id number] DOB: 12-Feb-1941 DOA: 10/22/2020 PCP: Mosetta Putt, MD  Brief Narrative: The patient was admitted for right lower extremity DVT and suspected pulmonary embolism.   Subjective: Admitted early this morning for right lower extremity DVT.  Resting comfortably in bed.  Chest pain is much improved since this morning.  Now on heparin drip.  D-dimer was elevated but unable to obtain CT PE study due to IV contrast allergy.  Audiology service as of already canceled the patient's VQ scan as it would not change his management.  No right lower extremity pain.   Assessment & Plan:   Active Problems:   Chest pain  Chest pain bleed resolved likely secondary to suspected pulmonary embolism 2 sets of troponin normal. Not likely to be ACS Elevated D-dimer greater than 9 Right lower extremity DVT Allergic to IV contrast which precludes CTA VQ scan been canceled by cardiology as it would not change his management and also a poor quality study. IV morphine as needed for severe pain Oxycodone as needed for moderate pain Tylenol as needed for mild pain   Right lower extremity DVT Suspect provoked Recent surgery Currently on heparin drip.  Cardiology discussing with surgery if we can switch the patient to a DOAC.   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 Gery Pray outpatient Cardiology consulted.  They are discussing with surgery on possibly switching to DOAC   Paroxysmal A. Fib, valvular, on Coumadin Rate is currently controlled Continue home amiodarone   Subtherapeutic INR INR 1.7 Continue heparin drip. Resume Coumadin with pharmacy dosing.   Intermittent dizziness Obtain orthostatic vital signs PT OT assessment Fall precautions   Hypothyroidism Continue home levothyroxine  Essential hypertension BP is currently not at goal, elevated.  We will increase the patient's home losartan 100 mg daily Monitor vital  signs  Dyslipidemia Continue home Zetia and rosuvastatin   DVT prophylaxis: On heparin drip.   Code Status: Full code   Family Communication: None at bedside   Disposition Plan: Cardiac telemetry  Consults called: Cardiology   Admission status: Inpatient status.  Patient will require at least 2 midnights for further evaluation and treatment of present condition.     Status is: Inpatient       Dispo:             Patient From: Home            Planned Disposition: Home, possibly on 10/24/2020.           Medically stable for discharge: No                 Objective: Vitals:   10/23/20 0714 10/23/20 0800 10/23/20 0900 10/23/20 1100  BP: (!) 150/85 140/78 (!) 165/97 138/89  Pulse: 69 68 76 80  Resp: 14 15 12 20   Temp: 98.7 F (37.1 C)     TempSrc:      SpO2: 91%     Weight:      Height:        Intake/Output Summary (Last 24 hours) at 10/23/2020 1346 Last data filed at 10/23/2020 0900 Gross per 24 hour  Intake 101.75 ml  Output 650 ml  Net -548.25 ml   Filed Weights   10/22/20 1941 10/23/20 0130  Weight: 94.6 kg 94.8 kg    Examination:  General exam: Appears calm and comfortable  Respiratory system: Clear to auscultation. Respiratory effort normal. Cardiovascular system: S1 & S2 heard, RRR. No JVD,  murmurs, rubs, gallops or clicks. No pedal edema. Gastrointestinal system: Abdomen is nondistended, soft and nontender. No organomegaly or masses felt. Normal bowel sounds heard. Central nervous system: Alert and oriented. No focal neurological deficits. Extremities: Symmetric 5 x 5 power. Skin: No rashes, lesions or ulcers Psychiatry: Judgement and insight appear normal. Mood & affect appropriate.     Data Reviewed: I have personally reviewed following labs and imaging studies  CBC: Recent Labs  Lab 10/22/20 2055 10/23/20 0706  WBC 6.8 5.7  NEUTROABS 5.2  --   HGB 12.7* 11.8*  HCT 37.6* 34.5*  MCV 94.5 93.0  PLT 127* 114*    Basic Metabolic  Panel: Recent Labs  Lab 10/22/20 2038 10/22/20 2055 10/23/20 0706  NA  --  136 137  K  --  4.0 4.2  CL  --  103 105  CO2  --  26 23  GLUCOSE  --  125* 104*  BUN  --  15 11  CREATININE  --  1.25* 1.12  CALCIUM  --  8.5* 8.8*  MG 2.1  --  1.9  PHOS  --   --  3.2    GFR: Estimated Creatinine Clearance: 63.8 mL/min (by C-G formula based on SCr of 1.12 mg/dL).  Liver Function Tests: No results for input(s): AST, ALT, ALKPHOS, BILITOT, PROT, ALBUMIN in the last 168 hours.  CBG: No results for input(s): GLUCAP in the last 168 hours.   Recent Results (from the past 240 hour(s))  Resp Panel by RT-PCR (Flu A&B, Covid) Nasopharyngeal Swab     Status: None   Collection Time: 10/22/20 10:55 PM   Specimen: Nasopharyngeal Swab; Nasopharyngeal(NP) swabs in vial transport medium  Result Value Ref Range Status   SARS Coronavirus 2 by RT PCR NEGATIVE NEGATIVE Final    Comment: (NOTE) SARS-CoV-2 target nucleic acids are NOT DETECTED.  The SARS-CoV-2 RNA is generally detectable in upper respiratory specimens during the acute phase of infection. The lowest concentration of SARS-CoV-2 viral copies this assay can detect is 138 copies/mL. A negative result does not preclude SARS-Cov-2 infection and should not be used as the sole basis for treatment or other patient management decisions. A negative result may occur with  improper specimen collection/handling, submission of specimen other than nasopharyngeal swab, presence of viral mutation(s) within the areas targeted by this assay, and inadequate number of viral copies(<138 copies/mL). A negative result must be combined with clinical observations, patient history, and epidemiological information. The expected result is Negative.  Fact Sheet for Patients:  BloggerCourse.com  Fact Sheet for Healthcare Providers:  SeriousBroker.it  This test is no t yet approved or cleared by the [place] FDA and  has been authorized for detection and/or diagnosis of SARS-CoV-2 by FDA under an Emergency Use Authorization (EUA). This EUA will remain  in effect (meaning this test can be used) for the duration of the COVID-19 declaration under Section 564(b)(1) of the Act, 21 U.S.C.section 360bbb-3(b)(1), unless the authorization is terminated  or revoked sooner.       Influenza A by PCR NEGATIVE NEGATIVE Final   Influenza B by PCR NEGATIVE NEGATIVE Final    Comment: (NOTE) The Xpert Xpress SARS-CoV-2/FLU/RSV plus assay is intended as an aid in the diagnosis of influenza from Nasopharyngeal swab specimens and should not be used as a sole basis for treatment. Nasal washings and aspirates are unacceptable for Xpert Xpress SARS-CoV-2/FLU/RSV testing.  Fact Sheet for Patients: BloggerCourse.com  Fact Sheet for Healthcare Providers: SeriousBroker.it  This test  is not yet approved or cleared by the [place] FDA and has been authorized for detection and/or diagnosis of SARS-CoV-2 by FDA under an Emergency Use Authorization (EUA). This EUA will remain in effect (meaning this test can be used) for the duration of the COVID-19 declaration under Section 564(b)(1) of the Act, 21 U.S.C. section 360bbb-3(b)(1), unless the authorization is terminated or revoked.  Performed at [facility] [place], [address] [place], [place] [address]   MRSA Next Gen by PCR, Nasal     Status: None   Collection Time: 10/23/20  2:00 AM   Specimen: Nasal Mucosa; Nasal Swab  Result Value Ref Range Status   MRSA by PCR Next Gen NOT DETECTED NOT DETECTED Final    Comment: (NOTE) The GeneXpert MRSA Assay (FDA approved for NASAL specimens only), is one component of a comprehensive MRSA colonization surveillance program. It is not intended to diagnose MRSA infection nor to guide or monitor treatment for MRSA infections. Test performance is not FDA  approved in patients less than 78 years old. Performed at [facility], [address] [place], [place] [address]          Radiology Studies: DG Chest 2 View  Result Date: 10/22/2020 CLINICAL DATA:  Palpitations. EXAM: CHEST - 2 VIEW COMPARISON:  October 11, 2020 FINDINGS: Stable linear scarring is noted within the mid right lung. The left lung is clear. A small, stable right pleural effusion is seen. No pneumothorax is identified. The heart size and mediastinal contours are within normal limits. An artificial mitral valve is noted. The visualized skeletal structures are unremarkable. IMPRESSION: 1. Stable linear scarring within the mid right lung. 2. Small, stable right pleural effusion. Electronically Signed   By: Aram Candelahaddeus  Houston M.D.   On: 10/22/2020 21:47   CT Head Wo Contrast  Result Date: 10/22/2020 CLINICAL DATA:  Palpitations. EXAM: CT HEAD WITHOUT CONTRAST TECHNIQUE: Contiguous axial images were obtained from the base of the skull through the vertex without intravenous contrast. COMPARISON:  June 28, 2020 FINDINGS: Brain: There is mild cerebral atrophy with widening of the extra-axial spaces and ventricular dilatation. There are areas of decreased attenuation within the white matter tracts of the supratentorial brain, consistent with microvascular disease changes. Vascular: No hyperdense vessel or unexpected calcification. Skull: Normal. Negative for fracture or focal lesion. Sinuses/Orbits: No acute finding. Other: None. IMPRESSION: 1. Generalized cerebral atrophy. 2. No acute intracranial abnormality. Electronically Signed   By: Aram Candelahaddeus  Houston M.D.   On: 10/22/2020 21:56   US Venous Img Lower Right (DVT Study)  Result Date: 10/22/2020 CLINICAL DATA:  Right leg swelling. EXAM: RIGHT LOWER EXTREMITY VENOUS DOPPLER ULTRASOUND TECHNIQUE: Gray-scale sonography with compression, as well as color and duplex ultrasound, were performed to evaluate the deep venous system(s) from  the level of the common femoral vein through the popliteal and proximal calf veins. COMPARISON:  None. FINDINGS: VENOUS Abnormal compressibility of the RIGHT common femoral, superficial femoral, and popliteal veins. Visualized portions of the RIGHT profunda femoral vein and RIGHT great saphenous vein are also abnormal. Filling defects suggestive of DVT are seen on grayscale and color Doppler imaging. Doppler waveforms show abnormal direction of venous flow, abnormal respiratory plasticity and abnormal response to augmentation. The visualized RIGHT calf veins are unremarkable. Limited views of the contralateral common femoral vein are unremarkable. OTHER None. Limitations: none IMPRESSION: Findings consistent with occlusive RIGHT lower extremity DVT from the RIGHT common femoral vein to the RIGHT popliteal vein. Electronically Signed  By: Aram Candela M.D.   On: 10/22/2020 21:44        Scheduled Meds:  amiodarone  200 mg Oral BID   aspirin  81 mg Oral Daily   ezetimibe  10 mg Oral QHS   levothyroxine  75 mcg Oral Q0600   losartan  100 mg Oral Daily   rosuvastatin  40 mg Oral QHS   Warfarin - Pharmacist Dosing Inpatient   Does not apply q1600   Continuous Infusions:  heparin 1,350 Units/hr (10/23/20 0900)     LOS: 0 days    Time spent: 35 minutes    Verdia Kuba, MD [facility]   To contact the attending provider between 7A-7P or the covering provider during after hours 7P-7A, please log into the web site [email or web address] and access using universal [place] password for that web site. If you do not have the password, please call the hospital operator.  10/23/2020, 1:46 PM

## 2020-10-23 NOTE — Progress Notes (Signed)
C/O of lightheaded and headache during ortho VS

## 2020-10-23 NOTE — ED Notes (Signed)
Pt wife at bedside during transfer

## 2020-10-23 NOTE — Plan of Care (Signed)

## 2020-10-23 NOTE — Consult Note (Addendum)
Cardiology Consultation:   Patient ID: Jawann Urbani MRN: [id number]; DOB: 21-Mar-1941  Admit date: 10/22/2020 Date of Consult: 10/23/2020  PCP:  Mosetta Putt, MD   [facility] Providers Cardiologist:  Nanetta Batty, MD   {  Patient Profile:   Reagen Haberman is a 80 y.o. 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  History of Present Illness:   Mr. Homann was seen on 06/17 by Azalee Course, PAC. Concern for dizziness, weakness and facial tingling.  It seemed to be an hour or more after he took his medications.  His metoprolol was stopped.  He was to obtain an echocardiogram and then follow-up with Dr. Allyson Sabal in July.  At that time, consideration can be given to stopping the amiodarone.  He has continued to have problems with dizziness and DOE since the surgery. He then developed chest pain, L sided and continuous. No change w/ palpation or deep inspiration. No association w/ exertion.  His dizziness has continued and is orthostatic in nature. No syncope.   Occasional palpitations, but not associated with the dizziness.   He has been having some headaches, but his BP has been elevated.   On the day of admission, he tried to do a little more than he had been doing. Some mild edema in his RLE.   He went to [facility] where his d-dimer was elevated and Dopplers were + for DVT. INR was subtherapeutic. He was sent to [facility] for further evaluation and a VQ scan.    Past Medical History:  Diagnosis Date   Aortic atherosclerosis (HCC)    Bradycardia    while on BB with HR to the 40s   Carotid stenosis    CAROTID DOPPLER, 12/07/2011 - Mild arthrosclerotic changes, no high-grade stenosis   Cataract     rt. eye   Clostridium difficile infection 05/2019   Diverticulitis    Heart murmur    History of echocardiogram    Echo 9/16:  EF 60-65%, no RWMA, Gr 1 DD, holosystolic MVP of posterior leaflet, mild MR, LA upper limits of normal, normal RVF, mod TR, PASP 50 mmHg   Hypercholesteremia    Hyperlipidemia    Hypertension    Hypothyroidism    MVP (mitral valve prolapse)    a. Echo 12/07/2011 - EF-65-70%, severe mitral regurgitation, moderate-severe tricuspid regurgitation, moderate pulmonary HTN, moderate pulmonic regurgitation;  b. 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   Complex valvuloplasty including artificial Gore-tex neochords x10 with 28 mm Medtronic Simuform ring annuloplasty   Testicular hypofunction    Vitamin D deficiency     Past Surgical History:  Procedure Laterality Date   COLONOSCOPY     ETHMOIDECTOMY Left 08/13/2020   Procedure: SPENOIDECTOMY AND ETHMOIDECTOMY;  Surgeon: Drema Halon, MD;  Location: [facility] OR;  Service: ENT;  Laterality: Left;   MITRAL  VALVE REPAIR Right 09/22/2020   Procedure: MINIMALLY INVASIVE MITRAL VALVE REPAIR (MVR) USING MEDTRONIC SIMUFORM SEMI-RIGID ANNULOPLASTY RING SIZE ON PUMP;  Surgeon: Purcell Nails, MD;  Location: [facility] OR;  Service: Open Heart Surgery;  Laterality: Right;   RIGHT/LEFT HEART CATH AND CORONARY ANGIOGRAPHY N/A 08/30/2020   Procedure: RIGHT/LEFT HEART CATH AND CORONARY ANGIOGRAPHY;  Surgeon: Runell Gess, MD;  Location: [facility] INVASIVE CV LAB;  Service: Cardiovascular;  Laterality: N/A;   SIGMOIDOSCOPY  02/14/2017    SINUS ENDO WITH FUSION Left 08/13/2020   Procedure: SINUS ENDO WITH FUSION;  Surgeon: Drema Halon, MD;  Location: [facility] OR;  Service: ENT;  Laterality: Left;   TEE WITHOUT CARDIOVERSION N/A 05/04/2020   Procedure: TRANSESOPHAGEAL ECHOCARDIOGRAM (TEE);  Surgeon: Little Ishikawa, MD;  Location: [facility] ENDOSCOPY;  Service: Cardiovascular;  Laterality: N/A;   TEE WITHOUT CARDIOVERSION N/A 09/22/2020   Procedure: TRANSESOPHAGEAL ECHOCARDIOGRAM (TEE);  Surgeon: Purcell Nails, MD;  Location: [facility] OR;  Service: Open Heart Surgery;  Laterality: N/A;     Home Medications:  Prior to Admission medications   Medication Sig Start Date End Date Taking? Authorizing Provider  acetaminophen (TYLENOL) 500 MG tablet Take 1,000 mg by mouth every 6 (six) hours as needed for mild pain or moderate pain.   Yes [provider]  amiodarone (PACERONE) 200 MG tablet Take 1 tablet (200 mg total) by mouth 2 (two) times daily. X 7 days, then decrease to 200 mg daily 09/29/20  Yes Barrett, Erin R, PA-C  Ascorbic Acid (VITAMIN C) 1000 MG tablet Take 1,000 mg by mouth daily.   Yes [provider]  aspirin EC 81 MG EC tablet Take 1 tablet (81 mg total) by mouth daily. Swallow whole. 09/29/20  Yes Barrett, Rae Roam, PA-C  Cholecalciferol (VITAMIN D) 50 MCG (2000 UT) tablet Take 2,000 Units by mouth daily.   Yes [provider]  diphenhydramine-acetaminophen (TYLENOL PM) 25-500 MG TABS tablet Take 1 tablet by mouth at bedtime as needed (sleep).   Yes [provider]  ezetimibe (ZETIA) 10 MG tablet Take 10 mg by mouth at bedtime.   Yes [provider]  levothyroxine (SYNTHROID) 75 MCG tablet Take 75 mcg by mouth at bedtime.   Yes [provider]  losartan (COZAAR) 50 MG tablet Take 1 tablet (50 mg total) by mouth daily. 10/19/20 10/19/21 Yes Runell Gess, MD  rosuvastatin (CRESTOR) 40 MG tablet Take 40 mg by mouth at bedtime.   Yes [provider]  sodium chloride  (OCEAN) 0.65 % SOLN nasal spray Place 1 spray into both nostrils as needed for congestion.   Yes [provider]  warfarin (COUMADIN) 2 MG tablet Take 1 tablet (2 mg total) by mouth daily at 4 PM. 09/29/20  Yes Barrett, Erin R, PA-C  fluticasone (FLONASE) 50 MCG/ACT nasal spray Place 1 spray into both nostrils at bedtime.    [provider]    Inpatient Medications: Scheduled Meds:  amiodarone  200 mg Oral BID   aspirin  81 mg Oral Daily   ezetimibe  10 mg Oral QHS   levothyroxine  75 mcg Oral Q0600   losartan  100 mg Oral Daily   rosuvastatin  40 mg Oral QHS   warfarin  4 mg Oral ONCE-1600   Warfarin - Pharmacist Dosing Inpatient   Does not apply q1600   Continuous Infusions:  heparin 1,350 Units/hr (10/22/20 2303)   PRN Meds: acetaminophen, hydrALAZINE, melatonin, morphine injection, ondansetron (ZOFRAN) IV, oxyCODONE, polyethylene  glycol  Allergies:    Allergies  Allergen Reactions   Contrast Media [Iodinated Diagnostic Agents] Hives, Nausea And Vomiting and Cough    Pt will need a 13 hour prep before any contrast per Dr. Paulina Fusi (05/14/19)   Hctz [Hydrochlorothiazide] Other (See Comments)    palpitations   Metoprolol Succinate [Metoprolol] Other (See Comments)    Bradycardia      Social History:   Social History   Socioeconomic History   Marital status: Married    Spouse name: Not on file   Number of children: Not on file   Years of education: Not on file   Highest education level: Not on file  Occupational History   Not on file  Tobacco Use   Smoking status: Former    Pack years: 0.00   Smokeless tobacco: Former    Types: Associate Professor Use: Never used  Substance and Sexual Activity   Alcohol use: Yes    Alcohol/week: 0.0 standard drinks    Comment: occasionally   Drug use: No   Sexual activity: Not on file  Other Topics Concern   Not on file  Social History Narrative   Married, owner of Facilities manager company   Has [address]  acre farm in [place]   Second home [place], enjoys fihing, cars (Has ZR1)   Social Determinants of Health   Financial Resource Strain: Not on file  Food Insecurity: Not on file  Transportation Needs: Not on file  Physical Activity: Not on file  Stress: Not on file  Social Connections: Not on file  Intimate Partner Violence: Not on file    Family History:   Family History  Problem Relation Age of Onset   Stroke Mother 62   Heart failure Father    Diverticulitis Sister    Stroke Brother 22   Heart attack Brother    Colon cancer Neg Hx    Esophageal cancer Neg Hx    Stomach cancer Neg Hx    Rectal cancer Neg Hx     Family Status  Relation Name Status   Mother  Deceased at age 72   Father  Deceased at age 94   Sister  Alive   Brother  Deceased at age 81   Sister  Alive   Sister  Alive   Sister  Alive   Sister  Alive   Child  Alive   Child  Alive   MGM  Deceased   MGF  Deceased   PGM  Deceased   PGF  Deceased   Brother  Deceased   Neg Hx  (Not Specified)    ROS:  Please see the history of present illness.  All other ROS reviewed and negative.     Physical Exam/Data:   Vitals:   10/23/20 0130 10/23/20 0341 10/23/20 0500 10/23/20 0714  BP: (!) 148/86 (!) 163/80 139/87 (!) 150/85  Pulse: 84 72 70 69  Resp: 14 16 14 14   Temp: 97.9 F (36.6 C) 98.1 F (36.7 C)  98.7 F (37.1 C)  TempSrc: Oral Oral    SpO2: 96% 93% 93% 91%  Weight: 94.8 kg     Height: 5\' 10"  (1.778 m)       Intake/Output Summary (Last 24 hours) at 10/23/2020 0843 Last data filed at 10/23/2020 0130 Gross per 24 hour  Intake --  Output 650 ml  Net -650 ml   Last 3 Weights 10/23/2020 10/22/2020 10/15/2020  Weight (lbs) 208 lb 15.9 oz  208 lb 8.9 oz 208 lb 9.6 oz  Weight (kg) 94.8 kg 94.6 kg 94.62 kg     Body mass index is 29.99 kg/m.  General:  Well nourished, well developed, in no acute distress HEENT: normal for age Lymph: no adenopathy Neck: no JVD Endocrine:  No  thryomegaly Vascular: No carotid bruits; 4/4 extremity pulses 2+ bilaterally  Cardiac:  normal S1, S2; RRR; no murmur  Lungs:  clear to auscultation bilaterally, no wheezing, rhonchi or rales  Abd: soft, nontender, no hepatomegaly  Ext: neg L, trace R LE edema Musculoskeletal:  No deformities, BUE and BLE strength normal and equal Skin: warm and dry, Incision healing well Neuro:  CNs 2-12 intact, no focal abnormalities noted Psych:  Normal affect   EKG:  The EKG was personally reviewed and demonstrates:  SR, HR 74, LBBB (old) Telemetry:  Telemetry was personally reviewed and demonstrates:  SR  Relevant CV Studies:  LE DOPPLERS 10/22/2020 FINDINGS: VENOUS   Abnormal compressibility of the RIGHT common femoral, superficial femoral, and popliteal veins. Visualized portions of the RIGHT profunda femoral vein and RIGHT great saphenous vein are also abnormal. Filling defects suggestive of DVT are seen on grayscale and color Doppler imaging. Doppler waveforms show abnormal direction of venous flow, abnormal respiratory plasticity and abnormal response to augmentation. The visualized RIGHT calf veins are unremarkable.   Limited views of the contralateral common femoral vein are unremarkable.   OTHER   None.   Limitations: none   IMPRESSION: Findings consistent with occlusive RIGHT lower extremity DVT from the RIGHT common femoral vein to the RIGHT popliteal vein.    INTRAOPERATIVE TEE: 09/22/2020 Complications: No known complications during this procedure.  POST-OP IMPRESSIONS  - Left Ventricle: The left ventricle is unchanged from pre-bypass.  - Right Ventricle: The right ventricle appears unchanged from pre-bypass.  - Aorta: The aorta appears unchanged from pre-bypass.  - Left Atrial Appendage: The left atrial appendage appears unchanged from  pre-bypass.  - Aortic Valve: The aortic valve appears unchanged from pre-bypass.  - Mitral Valve: No stenosis present. There is no  regurgitation. The mitral  valve  is status post repair with an annuloplasty ring.  - Tricuspid Valve: The tricuspid valve appears unchanged from pre-bypass.  - Pulmonic Valve: The pulmonic valve appears unchanged from pre-bypass.  - Interatrial Septum: The interatrial septum appears unchanged from  pre-bypass.  - Pericardium: The pericardium appears unchanged from pre-bypass.  - Comments: S/P MVR with size 68mm annuloplasty ring. Emergence on  Phenylephrine  support. Ring seated appropriately without rock or perivalvular leak. Mean  PG  post procedure. No new or worsening wall motion or valvular  abnormalities.   Laboratory Data:  High Sensitivity Troponin:   Recent Labs  Lab 10/22/20 2055 10/22/20 2255  TROPONINIHS 10 17     Chemistry Recent Labs  Lab 10/22/20 2055 10/23/20 0706  NA 136 137  K 4.0 4.2  CL 103 105  CO2 26 23  GLUCOSE 125* 104*  BUN 15 11  CREATININE 1.25* 1.12  CALCIUM 8.5* 8.8*  GFRNONAA 59* >60  ANIONGAP 7 9    No results for input(s): PROT, ALBUMIN, AST, ALT, ALKPHOS, BILITOT in the last 168 hours. Hematology Recent Labs  Lab 10/22/20 2055 10/23/20 0706  WBC 6.8 5.7  RBC 3.98* 3.71*  HGB 12.7* 11.8*  HCT 37.6* 34.5*  MCV 94.5 93.0  MCH 31.9 31.8  MCHC 33.8 34.2  RDW 14.3 14.0  PLT 127* 114*   BNPNo results for input(s):  BNP, PROBNP in the last 168 hours.  DDimer  Recent Labs  Lab 10/22/20 2038  DDIMER 9.13*   Lab Results  Component Value Date   INR 1.7 (H) 10/23/2020   INR 1.7 (H) 10/22/2020   INR 2.0 10/15/2020     Radiology/Studies:  DG Chest 2 View  Result Date: 10/22/2020 CLINICAL DATA:  Palpitations. EXAM: CHEST - 2 VIEW COMPARISON:  October 11, 2020 FINDINGS: Stable linear scarring is noted within the mid right lung. The left lung is clear. A small, stable right pleural effusion is seen. No pneumothorax is identified. The heart size and mediastinal contours are within normal limits. An artificial mitral valve is  noted. The visualized skeletal structures are unremarkable. IMPRESSION: 1. Stable linear scarring within the mid right lung. 2. Small, stable right pleural effusion. Electronically Signed   By: Aram Candela M.D.   On: 10/22/2020 21:47   CT Head Wo Contrast  Result Date: 10/22/2020 CLINICAL DATA:  Palpitations. EXAM: CT HEAD WITHOUT CONTRAST TECHNIQUE: Contiguous axial images were obtained from the base of the skull through the vertex without intravenous contrast. COMPARISON:  June 28, 2020 FINDINGS: Brain: There is mild cerebral atrophy with widening of the extra-axial spaces and ventricular dilatation. There are areas of decreased attenuation within the white matter tracts of the supratentorial brain, consistent with microvascular disease changes. Vascular: No hyperdense vessel or unexpected calcification. Skull: Normal. Negative for fracture or focal lesion. Sinuses/Orbits: No acute finding. Other: None. IMPRESSION: 1. Generalized cerebral atrophy. 2. No acute intracranial abnormality. Electronically Signed   By: Aram Candela M.D.   On: 10/22/2020 21:56   US Venous Img Lower Right (DVT Study)  Result Date: 10/22/2020 CLINICAL DATA:  Right leg swelling. EXAM: RIGHT LOWER EXTREMITY VENOUS DOPPLER ULTRASOUND TECHNIQUE: Gray-scale sonography with compression, as well as color and duplex ultrasound, were performed to evaluate the deep venous system(s) from the level of the common femoral vein through the popliteal and proximal calf veins. COMPARISON:  None. FINDINGS: VENOUS Abnormal compressibility of the RIGHT common femoral, superficial femoral, and popliteal veins. Visualized portions of the RIGHT profunda femoral vein and RIGHT great saphenous vein are also abnormal. Filling defects suggestive of DVT are seen on grayscale and color Doppler imaging. Doppler waveforms show abnormal direction of venous flow, abnormal respiratory plasticity and abnormal response to augmentation. The visualized  RIGHT calf veins are unremarkable. Limited views of the contralateral common femoral vein are unremarkable. OTHER None. Limitations: none IMPRESSION: Findings consistent with occlusive RIGHT lower extremity DVT from the RIGHT common femoral vein to the RIGHT popliteal vein. Electronically Signed   By: Aram Candela M.D.   On: 10/22/2020 21:44     Assessment and Plan:   LE DVT - per the patient, he has had several changes in his coumadin dose, despite compliance with a low K+ diet - initially INR 3.0, has been sub-therapeutic since then - currently on heparin, till INR comes up - coumadin was started at the time of his valve surgery "coumadin for MV Repair" from 05/26 note by Dr Cornelius Moras - MD to discuss w/ Dr Cornelius Moras if possible to change to DOAC, pt willing - at this time, whether or not he has a PE will not change treatment, will cancel VQ scan.  2. Minimally-Invasive Mitral Valve Repair, Complex valvuloplasty including artificial PTFE neochord placement x10 and Medtronic Simuform Ring Annuloplasty (size 37mm, catalog # [id number], serial # [id number]) - incision healing well - MD advise if any further eval needed  3. Chest  pain - suspect secondary to scar tissue or healing from surgery vs from PE - ez negative - CXR no sig abnormality - treat w/ prn rx  Otherwise, per IM  Risk Assessment/Risk Scores:    For questions or updates, please contact [facility] Please consult [email or web address] for contact info under    Signed, Theodore DemarkRhonda Barrett, PA-C  10/23/2020 8:43 AM  Cardiology Attending  Patient seen and examined. Agree with above with minimal modification. The patient presents with chest pain and found to have a DVT. He is s/p minimally invasive MV repair with a PTFE neochord X 10 and Simuform ring. He was placed on warfarin after surgery and has had several subtherapeutic INR's and now has a DVT in the right leg. He has developed chest pain which is atypical for coronary ischemia (no  obstructive disease by heart cath) and consistent with a small PE. He has been placed on IV heparin. His exam reveals a well appearing 80 yo man who looks younger than stated age and RRR and clear lungs. Chest wall is not tender and right leg with minimal swelling, non-tender. Labs are reviewed  A/P DVT - he probably has a small PE which would be difficult to see on V/Q scan. He has a contrast allergy. I would not pursue spiral CT or V/Q as it will not change his treatment. Real question is whether we can place him on an OAC. I will attempt to reach out to surgery. Will continue IV heparin for now.  Elevated bp - his pressures have been up but better now. We will follow. Orthostasis - he c/o dizziness when he gets up and I will ask the nurses to check orthostatic vitals.  Minimally invasive MV repair - his exam looks good. We will follow.  Sharlot GowdaGregg Cavion Faiola,MD

## 2020-10-23 NOTE — H&P (Addendum)
History and Physical  Chad Berry WGN:[id number] DOB: 09/26/1940 DOA: 10/22/2020  Referring physician: Direct admission from [facility] ED, accepted by Dr. Hessie Knows, [facility]. PCP: Mosetta Putt, MD  Outpatient Specialists: Cardiology. Patient coming from: Home.   Chief Complaint: Palpitations and left-sided chest pain.  HPI: Chad Berry is a 80 y.o. male with medical history significant for mitral valve repair a month ago on 09/22/2020, complicated by A. fib, was started on Coumadin and amiodarone.  Reports intermittent dizziness worse when he stands since leaving the hospital.  Reports since his discharge he has attempted to be more active.  He reports some palpitations and chest pain on the day of presentation.  He presented to the ED for further evaluation.  In the ED his INR was subtherapeutic.  2 sets of troponin were normal however he had elevated D-dimer.  Plan was to transfer to [facility] for VQ scan.  Patient was found to have a right lower extremity DVT.  Was started on heparin drip.  Cardiology was contacted by EDP.  TRH, hospitalist team, was asked to admit.  Patient was accepted as a direct transfer from [facility] to [facility] 2C unit by Dr. Robb Matar, [facility].  ED Course: Temperature 98.1.  BP 163/80, pulse 72, respiration rate 16, O2 saturation 93% on room air.  Lab studies remarkable for serum creatinine 1.25, GFR 59, serum glucose 125, magnesium 2.1.  WBC 6.8, hemoglobin 12.7, platelet count 127.  D-dimer 9.13.  INR 1.7.  Review of Systems: Review of systems as noted in the HPI. All other systems reviewed and are negative.   Past Medical History:  Diagnosis Date   Aortic atherosclerosis (HCC)    Bradycardia    while on BB with HR to the 40s   Carotid stenosis    CAROTID DOPPLER, 12/07/2011 - Mild arthrosclerotic changes, no high-grade stenosis   Cataract    rt. eye   Clostridium difficile infection 05/2019   Diverticulitis    Heart murmur    History of echocardiogram    Echo 9/16:  EF  60-65%, no RWMA, Gr 1 DD, holosystolic MVP of posterior leaflet, mild MR, LA upper limits of normal, normal RVF, mod TR, PASP 50 mmHg   Hypercholesteremia    Hyperlipidemia    Hypertension    Hypothyroidism    MVP (mitral valve prolapse)    a. Echo 12/07/2011 - EF-65-70%, severe mitral regurgitation, moderate-severe tricuspid regurgitation, moderate pulmonary HTN, moderate pulmonic regurgitation;  b. 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   Complex valvuloplasty including artificial Gore-tex neochords x10 with 28 mm Medtronic Simuform ring annuloplasty   Testicular hypofunction    Vitamin D deficiency    Past Surgical History:  Procedure Laterality Date   COLONOSCOPY     ETHMOIDECTOMY Left 08/13/2020   Procedure: SPENOIDECTOMY AND ETHMOIDECTOMY;  Surgeon: Drema Halon, MD;  Location: [facility] OR;  Service: ENT;  Laterality: Left;   MITRAL VALVE REPAIR Right 09/22/2020   Procedure: MINIMALLY INVASIVE MITRAL VALVE REPAIR (MVR) USING MEDTRONIC SIMUFORM SEMI-RIGID ANNULOPLASTY RING SIZE ON PUMP;  Surgeon: Purcell Nails,  MD;  Location: [facility] OR;  Service: Open Heart Surgery;  Laterality: Right;   RIGHT/LEFT HEART CATH AND CORONARY ANGIOGRAPHY N/A 08/30/2020   Procedure: RIGHT/LEFT HEART CATH AND CORONARY ANGIOGRAPHY;  Surgeon: Runell Gess, MD;  Location: [facility] INVASIVE CV LAB;  Service: Cardiovascular;  Laterality: N/A;   SIGMOIDOSCOPY  02/14/2017   SINUS ENDO WITH FUSION Left 08/13/2020   Procedure: SINUS ENDO WITH FUSION;  Surgeon: Drema Halon, MD;  Location: [facility] OR;   Service: ENT;  Laterality: Left;   TEE WITHOUT CARDIOVERSION N/A 05/04/2020   Procedure: TRANSESOPHAGEAL ECHOCARDIOGRAM (TEE);  Surgeon: Little Ishikawa, MD;  Location: [facility] ENDOSCOPY;  Service: Cardiovascular;  Laterality: N/A;   TEE WITHOUT CARDIOVERSION N/A 09/22/2020   Procedure: TRANSESOPHAGEAL ECHOCARDIOGRAM (TEE);  Surgeon: Purcell Nails, MD;  Location: [facility] OR;  Service: Open Heart Surgery;  Laterality: N/A;    Social History:  reports that he has quit smoking. He has quit using smokeless tobacco.  His smokeless tobacco use included chew. He reports current alcohol use. He reports that he does not use drugs.   Allergies  Allergen Reactions   Contrast Media [Iodinated Diagnostic Agents] Hives, Nausea And Vomiting and Cough    Pt will need a 13 hour prep before any contrast per Dr. Paulina Fusi (05/14/19)   Hctz [Hydrochlorothiazide] Other (See Comments)    palpitations   Metoprolol Succinate [Metoprolol] Other (See Comments)    Bradycardia      Family History  Problem Relation Age of Onset   Stroke Mother 40   Heart failure Father    Diverticulitis Sister    Stroke Brother 7   Heart attack Brother    Colon cancer Neg Hx    Esophageal cancer Neg Hx    Stomach cancer Neg Hx    Rectal cancer Neg Hx       Prior to Admission medications   Medication Sig Start Date End Date Taking? Authorizing Provider  amiodarone (PACERONE) 200 MG tablet Take 1 tablet (200 mg total) by mouth 2 (two) times daily. X 7 days, then decrease to 200 mg daily 09/29/20   Barrett, Rae Roam, PA-C  Ascorbic Acid (VITAMIN C) 1000 MG tablet Take 1,000 mg by mouth daily.    [provider]  aspirin EC 81 MG EC tablet Take 1 tablet (81 mg total) by mouth daily. Swallow whole. 09/29/20   Barrett, Rae Roam, PA-C  Cholecalciferol (VITAMIN D) 50 MCG (2000 UT) tablet Take 2,000 Units by mouth daily.    [provider]  ezetimibe (ZETIA) 10 MG tablet Take 10 mg by mouth at bedtime.    [provider]  fluticasone (FLONASE) 50 MCG/ACT nasal spray Place 1 spray into both nostrils at bedtime.    [provider]  levothyroxine (SYNTHROID) 75 MCG tablet Take 75 mcg by mouth at bedtime.    [provider]  losartan (COZAAR) 50 MG tablet Take 1 tablet (50 mg total) by mouth daily. 10/19/20 10/19/21  Runell Gess, MD  rosuvastatin (CRESTOR) 40 MG tablet Take 40 mg by mouth at bedtime.    [provider]  sodium chloride (OCEAN) 0.65 % SOLN nasal spray Place 1 spray into both nostrils as needed for congestion.    [provider]  warfarin (COUMADIN) 2 MG tablet Take 1 tablet (2 mg total) by mouth daily at 4 PM. 09/29/20   Barrett, Rae Roam, PA-C    Physical Exam: BP (!) 148/86 (BP Location: Right Arm)   Pulse 84  Temp 97.9 F (36.6 C) (Oral)   Resp 14   Ht 5\' 10"  (1.778 m)   Wt 94.8 kg   SpO2 96%   BMI 29.99 kg/m   General: 80 y.o. year-old male well developed well nourished in no acute distress.  Alert and oriented x3. Cardiovascular: Regular rate and rhythm with no rubs or gallops.  No thyromegaly or JVD noted.  No lower extremity edema. 2/4 pulses in all 4 extremities. Respiratory: Clear to auscultation with no wheezes or rales. Good inspiratory effort. Abdomen: Soft nontender nondistended with normal bowel sounds x4 quadrants. Muskuloskeletal: No cyanosis, clubbing or edema noted bilaterally Neuro: CN II-XII intact, strength, sensation, reflexes Skin: No ulcerative lesions noted or rashes Psychiatry: Judgement and insight appear normal. Mood is appropriate for condition and setting          Labs on Admission:  Basic Metabolic Panel: Recent Labs  Lab 10/22/20 2038 10/22/20 2055  NA  --  136  K  --  4.0  CL  --  103  CO2  --  26  GLUCOSE  --  125*  BUN  --  15  CREATININE  --  1.25*  CALCIUM  --  8.5*  MG 2.1  --    Liver Function Tests: No results for input(s): AST, ALT, ALKPHOS, BILITOT, PROT, ALBUMIN in the last 168  hours. No results for input(s): LIPASE, AMYLASE in the last 168 hours. No results for input(s): AMMONIA in the last 168 hours. CBC: Recent Labs  Lab 10/22/20 2055  WBC 6.8  NEUTROABS 5.2  HGB 12.7*  HCT 37.6*  MCV 94.5  PLT 127*   Cardiac Enzymes: No results for input(s): CKTOTAL, CKMB, CKMBINDEX, TROPONINI in the last 168 hours.  BNP (last 3 results) No results for input(s): BNP in the last 8760 hours.  ProBNP (last 3 results) No results for input(s): PROBNP in the last 8760 hours.  CBG: No results for input(s): GLUCAP in the last 168 hours.  Radiological Exams on Admission: DG Chest 2 View  Result Date: 10/22/2020 CLINICAL DATA:  Palpitations. EXAM: CHEST - 2 VIEW COMPARISON:  October 11, 2020 FINDINGS: Stable linear scarring is noted within the mid right lung. The left lung is clear. A small, stable right pleural effusion is seen. No pneumothorax is identified. The heart size and mediastinal contours are within normal limits. An artificial mitral valve is noted. The visualized skeletal structures are unremarkable. IMPRESSION: 1. Stable linear scarring within the mid right lung. 2. Small, stable right pleural effusion. Electronically Signed   By: October 13, 2020 M.D.   On: 10/22/2020 21:47   CT Head Wo Contrast  Result Date: 10/22/2020 CLINICAL DATA:  Palpitations. EXAM: CT HEAD WITHOUT CONTRAST TECHNIQUE: Contiguous axial images were obtained from the base of the skull through the vertex without intravenous contrast. COMPARISON:  June 28, 2020 FINDINGS: Brain: There is mild cerebral atrophy with widening of the extra-axial spaces and ventricular dilatation. There are areas of decreased attenuation within the white matter tracts of the supratentorial brain, consistent with microvascular disease changes. Vascular: No hyperdense vessel or unexpected calcification. Skull: Normal. Negative for fracture or focal lesion. Sinuses/Orbits: No acute finding. Other: None. IMPRESSION: 1.  Generalized cerebral atrophy. 2. No acute intracranial abnormality. Electronically Signed   By: June 30, 2020 M.D.   On: 10/22/2020 21:56   10/24/2020 Venous Img Lower Right (DVT Study)  Result Date: 10/22/2020 CLINICAL DATA:  Right leg swelling. EXAM: RIGHT LOWER EXTREMITY VENOUS DOPPLER ULTRASOUND TECHNIQUE: Gray-scale sonography with  compression, as well as color and duplex ultrasound, were performed to evaluate the deep venous system(s) from the level of the common femoral vein through the popliteal and proximal calf veins. COMPARISON:  None. FINDINGS: VENOUS Abnormal compressibility of the RIGHT common femoral, superficial femoral, and popliteal veins. Visualized portions of the RIGHT profunda femoral vein and RIGHT great saphenous vein are also abnormal. Filling defects suggestive of DVT are seen on grayscale and color Doppler imaging. Doppler waveforms show abnormal direction of venous flow, abnormal respiratory plasticity and abnormal response to augmentation. The visualized RIGHT calf veins are unremarkable. Limited views of the contralateral common femoral vein are unremarkable. OTHER None. Limitations: none IMPRESSION: Findings consistent with occlusive RIGHT lower extremity DVT from the RIGHT common femoral vein to the RIGHT popliteal vein. Electronically Signed   By: Aram Candelahaddeus  Houston M.D.   On: 10/22/2020 21:44    EKG: I independently viewed the EKG done and my findings are as followed: Sinus rhythm rate of 82.  Nonspecific ST-T changes.  QTc 481.  Assessment/Plan Present on Admission:  Chest pain  Active Problems:   Chest pain  Chest pain, rule out pulmonary embolism 2 sets of troponin normal. Elevated D-dimer greater than 9 Right lower extremity DVT Allergic to IV contrast which precludes CTA VQ scan ordered, follow results. IV morphine as needed for severe pain Oxycodone as needed for moderate pain Tylenol as needed for mild pain  Right lower extremity DVT Suspect  provoked Recent surgery Currently on heparin drip  Status post mitral valve repair on 09/22/2020 Follows with cardiology Dr. Gery PrayBarry outpatient Cardiology consult for recommendations.  Paroxysmal A. Fib, valvular, on Coumadin Rate is currently controlled On Coumadin prior to admission for primary stroke prevention Subtherapeutic INR  Subtherapeutic INR INR 1.7 Continue heparin drip. Resume Coumadin with pharmacy dosing.  Intermittent dizziness Obtain orthostatic vital signs PT OT assessment Fall precautions  Hypothyroidism Resume home levothyroxine  Essential hypertension BP is currently not at goal, elevated. Resume home oral antihypertensive Monitor vital signs    DVT prophylaxis: On heparin drip.  Code Status: Full code  Family Communication: None at bedside  Disposition Plan: [facility] by Dr[facility] Robb Matarrtiz, [facility].  Consults called: Cardiology  Admission status: Inpatient status.  Patient will require at least 2 midnights for further evaluation and treatment of present condition.   Status is: Inpatient    Dispo:  Patient From: Home  Planned Disposition: Home, possibly on 10/24/2020.  Medically stable for discharge: No         Darlin Droparole N Serjio Deupree MD [facility] Pager [phone]  If 7PM-7AM, please contact night-coverage [email or web address] Password [email or web address]  10/23/2020, 3:35 AM

## 2020-10-23 NOTE — Plan of Care (Signed)

## 2020-10-23 NOTE — Progress Notes (Signed)
ANTICOAGULATION CONSULT NOTE - Initial Consult  Pharmacy Consult for Coumadin Indication:  recent h/o Afib and MV repair w/ new DVT while INR subtherapeutic and concern for PE  Allergies  Allergen Reactions   Contrast Media [Iodinated Diagnostic Agents] Hives, Nausea And Vomiting and Cough    Pt will need a 13 hour prep before any contrast per Dr. Paulina Fusi (05/14/19)   Hctz [Hydrochlorothiazide] Other (See Comments)    palpitations   Metoprolol Succinate [Metoprolol] Other (See Comments)    Bradycardia      Patient Measurements: Height: 5\' 10"  (177.8 cm) Weight: 94.8 kg (208 lb 15.9 oz) IBW/kg (Calculated) : 73  Vital Signs: Temp: 98.1 F (36.7 C) (06/25 0341) Temp Source: Oral (06/25 0341) BP: 163/80 (06/25 0341) Pulse Rate: 72 (06/25 0341)  Labs: Recent Labs    10/22/20 2055 10/22/20 2255  HGB 12.7*  --   HCT 37.6*  --   PLT 127*  --   LABPROT 19.9*  --   INR 1.7*  --   CREATININE 1.25*  --   TROPONINIHS 10 17    Estimated Creatinine Clearance: 57.2 mL/min (A) (by C-G formula based on SCr of 1.25 mg/dL (H)).   Medical History: Past Medical History:  Diagnosis Date   Aortic atherosclerosis (HCC)    Bradycardia    while on BB with HR to the 40s   Carotid stenosis    CAROTID DOPPLER, 12/07/2011 - Mild arthrosclerotic changes, no high-grade stenosis   Cataract    rt. eye   Clostridium difficile infection 05/2019   Diverticulitis    Heart murmur    History of echocardiogram    Echo 9/16:  EF 60-65%, no RWMA, Gr 1 DD, holosystolic MVP of posterior leaflet, mild MR, LA upper limits of normal, normal RVF, mod TR, PASP 50 mmHg   Hypercholesteremia    Hyperlipidemia    Hypertension    Hypothyroidism    MVP (mitral valve prolapse)    a. Echo 12/07/2011 - EF-65-70%, severe mitral regurgitation, moderate-severe tricuspid regurgitation, moderate pulmonary HTN, moderate pulmonic regurgitation;  b. 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 X 7 days, then decrease to 200 mg daily 60 tablet 3    Ascorbic Acid (VITAMIN C) 1000 MG tablet Take 1,000 mg by mouth daily.      aspirin EC 81 MG EC tablet Take 1 tablet (81 mg total) by mouth daily. Swallow whole. 30 tablet 11    Cholecalciferol (VITAMIN D) 50 MCG (2000 UT) tablet Take 2,000 Units by mouth daily.      ezetimibe (ZETIA) 10 MG tablet Take 10 mg by mouth at bedtime.      fluticasone (FLONASE) 50 MCG/ACT nasal spray Place 1 spray into both  nostrils at bedtime.      levothyroxine (SYNTHROID) 75 MCG tablet Take 75 mcg by mouth at bedtime.      losartan (COZAAR) 50 MG tablet Take 1 tablet (50 mg total) by mouth daily. 30 tablet 3    rosuvastatin (CRESTOR) 40 MG tablet Take 40 mg by mouth at bedtime.      sodium chloride (OCEAN) 0.65 % SOLN nasal spray Place 1 spray into both nostrils as needed for congestion.      warfarin (COUMADIN) 2 MG tablet Take 1 tablet (2 mg total) by mouth daily at 4 PM. 30 tablet 3    Scheduled:   amiodarone  200 mg Oral BID   aspirin  81 mg Oral Daily   ezetimibe  10 mg Oral QHS   levothyroxine  75 mcg Oral Q0600   losartan  50 mg Oral Daily   rosuvastatin  40 mg Oral QHS   Infusions:   heparin 1,350 Units/hr  (10/22/20 2303)    Assessment: 80yo male c/o HA and elevated BP on presentation to ED, admitted to palpitations and CP during ED w/u >> D-dimer was found to be elevated >> US reveals RLE DVT but contrast allergy prevents CT, awaiting VQ scan to evaluate for PE, started on UFH and to continue Coumadin for Afib and MV repair as well as new VTE; current INR below goal.  Goal of Therapy:  INR 2-3   Plan:  Coumadin 4mg  PO x1 today and monitor INR for dose adjustments.  Pt will require cont'd UFH/LMWH bridge given acute VTE.  , PharmD, BCPS  10/23/2020,4:24 AM

## 2020-10-23 NOTE — Progress Notes (Signed)
ANTICOAGULATION CONSULT NOTE - Follow Up Consult  Pharmacy Consult for Coumadin Indication:  recent h/o Afib and MV repair w/ new DVT while INR subtherapeutic and concern for PE  Allergies  Allergen Reactions   Contrast Media [Iodinated Diagnostic Agents] Hives, Nausea And Vomiting and Cough    Pt will need a 13 hour prep before any contrast per Dr. Paulina Fusi (05/14/19)   Hctz [Hydrochlorothiazide] Other (See Comments)    palpitations   Metoprolol Succinate [Metoprolol] Other (See Comments)    Bradycardia      Patient Measurements: Height: 5\' 10"  (177.8 cm) Weight: 94.8 kg (208 lb 15.9 oz) IBW/kg (Calculated) : 73  Vital Signs: Temp: 98.7 F (37.1 C) (06/25 0714) Temp Source: Oral (06/25 0341) BP: 138/89 (06/25 1100) Pulse Rate: 80 (06/25 1100)  Labs: Recent Labs    10/22/20 2055 10/22/20 2255 10/23/20 0706  HGB 12.7*  --  11.8*  HCT 37.6*  --  34.5*  PLT 127*  --  114*  LABPROT 19.9*  --  20.1*  INR 1.7*  --  1.7*  HEPARINUNFRC  --   --  0.48  CREATININE 1.25*  --  1.12  TROPONINIHS 10 17  --      Estimated Creatinine Clearance: 63.8 mL/min (by C-G formula based on SCr of 1.12 mg/dL).   Medical History: Past Medical History:  Diagnosis Date   Aortic atherosclerosis (HCC)    Bradycardia    while on BB with HR to the 40s   Carotid stenosis    CAROTID DOPPLER, 12/07/2011 - Mild arthrosclerotic changes, no high-grade stenosis   Cataract    rt. eye   Clostridium difficile infection 05/2019   Diverticulitis    Heart murmur    History of echocardiogram    Echo 9/16:  EF 60-65%, no RWMA, Gr 1 DD, holosystolic MVP of posterior leaflet, mild MR, LA upper limits of normal, normal RVF, mod TR, PASP 50 mmHg   Hypercholesteremia    Hyperlipidemia    Hypertension    Hypothyroidism    MVP (mitral valve prolapse)    a. Echo 12/07/2011 - EF-65-70%, severe mitral regurgitation, moderate-severe tricuspid regurgitation, moderate pulmonary HTN, moderate pulmonic  regurgitation;  b. 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   unk   amiodarone (PACERONE) 200 MG tablet Take 1 tablet (200 mg total) by mouth 2 (two) times daily. X 7 days, then decrease to 200 mg daily 60 tablet 3 10/22/2020   Ascorbic Acid (VITAMIN C) 1000 MG tablet Take 1,000 mg by mouth daily.   10/22/2020   aspirin EC 81 MG EC tablet Take 1 tablet (81 mg total) by mouth daily. Swallow whole. 30 tablet 11 10/22/2020  Cholecalciferol (VITAMIN D) 50 MCG (2000 UT) tablet Take 2,000 Units by mouth daily.   10/22/2020   diphenhydramine-acetaminophen (TYLENOL PM) 25-500 MG TABS tablet Take 1 tablet by mouth at bedtime as needed (sleep).   Past Week   ezetimibe (ZETIA) 10 MG tablet Take 10 mg by mouth at bedtime.   10/22/2020   levothyroxine (SYNTHROID) 75 MCG tablet Take 75 mcg by mouth at bedtime.   10/22/2020   losartan (COZAAR) 50 MG tablet Take 1 tablet (50 mg total) by mouth daily. 30 tablet 3 10/22/2020   rosuvastatin (CRESTOR) 40 MG tablet Take 40 mg by mouth at bedtime.   10/22/2020   sodium chloride (OCEAN) 0.65 % SOLN nasal spray Place 1 spray into both nostrils as needed for congestion.   Past Month   warfarin  (COUMADIN) 2 MG tablet Take 1 tablet (2 mg total) by mouth daily at 4 PM. 30 tablet 3 10/22/2020 at 1600   fluticasone (FLONASE) 50 MCG/ACT nasal spray Place 1 spray into both nostrils at bedtime.      Scheduled:   amiodarone  200 mg Oral BID   apixaban  10 mg Oral BID   Followed by   Melene Muller ON 10/30/2020] apixaban  5 mg Oral BID   aspirin  81 mg Oral Daily   ezetimibe  10 mg Oral QHS   levothyroxine  75 mcg Oral Q0600   losartan  100 mg Oral Daily   rosuvastatin  40 mg Oral QHS   Infusions:   heparin 1,350 Units/hr (10/23/20 0900)    Assessment: 79yo male c/o HA and elevated BP on presentation to ED, admitted to palpitations and CP during ED w/u >> D-dimer was found to be elevated >> US reveals RLE DVT but contrast allergy prevents CT. Started on UFH and to continue Coumadin for Afib and MV repair 5/25 as well as new VTE; current INR below goal. Per cards today change to apixaban   Goal of Therapy:  watch pltc    Plan:  Stop heparin at 2200 tonight and begin apixaban  Apixaban 10mg  BID x7 days then 5mg  BID    Pharm.D. CPP, BCPS Clinical Pharmacist [phone] 10/23/2020 3:10 PM

## 2020-10-23 NOTE — Evaluation (Signed)
Physical Therapy Evaluation/Discharge Patient Details Name: Chad Berry MRN: [id number] DOB: 1940-10-19 Today's Date: 10/23/2020   History of Present Illness  80 y.o. male admitted on 10/22/20 for palpitations, left sided chest pain.  Pt dx with R LE DVT and suspected PE, sub therapeutic INR, elevated BPs, orthostasis.  Pt started on Heparin and MD is in discussion with cardiac surgeon as to which oral blood thinner to transition him to prior to d/c (he was on coumadin).  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  Clinical Impression  Pt still feels he is not back to his baseline prior to his valve surgery in reguards to fatigue and endurance.  He reports he is due to start OP cardiac rehab on July 12th and is looking forward to building his strength.  He looked good on his feet with me today, so acute PT does not need to follow him acutely, but I have asked the mobility tech to add Chad Berry to his list for regular walks during his hospital stay.  PT to sign off and defer further mobility to mobility tech here acutely and OP Cardiac Rehab starting July 12th.      Follow Up Recommendations Other (comment) (plan to start OP cardiac rehab on July 12th near his home Chad Berry))    Equipment Recommendations  None recommended by PT    Recommendations for Other Services       Precautions / Restrictions Precautions Precautions: Other (comment) Precaution Comments: orthostasis      Mobility  Bed Mobility               General bed mobility comments: Pt was OOB in the recliner chair    Transfers Overall transfer level: Modified independent               General transfer comment: encouraged him to stand a moment to assess for lightheadedness, mild but able to continue without issues.  Ambulation/Gait Ambulation/Gait assistance:  Supervision Gait Distance (Feet): 300 Feet Assistive device: None Gait Pattern/deviations: WFL(Within Functional Limits) Gait velocity: decreased (slow, self selected pace) Gait velocity interpretation: 1.31 - 2.62 ft/sec, indicative of limited community ambulator General Gait Details: Slow, steady gait, any small LOB was self corrected.  Pt needed to get up and walk.  Stairs            Wheelchair Mobility    Modified Rankin (Stroke Patients Only)       Balance Overall balance assessment: No apparent balance deficits (not formally assessed)                                           Pertinent Vitals/Pain Pain Assessment: No/denies pain (reports intermittent HA)    Home Living Family/patient expects to be discharged to:: Private residence Living Arrangements: Spouse/significant other Available Help at Discharge: Family Type of Home: House       Home Layout: Multi-level Home Equipment: Shower seat - built in      Prior Function Level of Independence: Independent         Comments: works a farm and owns a business, very busy.     Hand Dominance   Dominant Hand: Right    Extremity/Trunk Assessment   Upper Extremity Assessment Upper Extremity Assessment: Defer to OT evaluation    Lower Extremity  Assessment Lower Extremity Assessment: Overall WFL for tasks assessed    Cervical / Trunk Assessment Cervical / Trunk Assessment: Normal  Communication   Communication: No difficulties  Cognition Arousal/Alertness: Awake/alert Behavior During Therapy: WFL for tasks assessed/performed Overall Cognitive Status: Within Functional Limits for tasks assessed                                        General Comments General comments (skin integrity, edema, etc.): VSS throughout on RA    Exercises     Assessment/Plan    PT Assessment Patent does not need any further PT services;All further PT needs can be met in the next venue  of care (Pt due to start OP cardiac rehab on July 12th)  PT Problem List Decreased activity tolerance;Cardiopulmonary status limiting activity       PT Treatment Interventions      PT Goals (Current goals can be found in the Care Plan section)  Acute Rehab PT Goals PT Goal Formulation: All assessment and education complete, DC therapy    Frequency     Barriers to discharge        Co-evaluation               AM-PAC PT "6 Clicks" Mobility  Outcome Measure Help needed turning from your back to your side while in a flat bed without using bedrails?: None Help needed moving from lying on your back to sitting on the side of a flat bed without using bedrails?: None Help needed moving to and from a bed to a chair (including a wheelchair)?: None Help needed standing up from a chair using your arms (e.g., wheelchair or bedside chair)?: None Help needed to walk in hospital room?: A Little Help needed climbing 3-5 steps with a railing? : A Little 6 Click Score: 22    End of Session   Activity Tolerance: Patient tolerated treatment well Patient left: in chair;with call bell/phone within reach Nurse Communication: Mobility status;Other (comment) (PT to defer further walking to mobility tech, spoke with mobility tech.) PT Visit Diagnosis: Muscle weakness (generalized) (M62.81);Difficulty in walking, not elsewhere classified (R26.2)    Time: 8257-4935 PT Time Calculation (min) (ACUTE ONLY): 24 min   Charges:   PT Evaluation $PT Eval Moderate Complexity: 1 Mod PT Treatments $Gait Training: 8-22 mins      Verdene Lennert, PT, DPT  Acute Rehabilitation Ortho Tech Supervisor [phone] pager [phone]) [phone] office

## 2020-10-23 NOTE — Evaluation (Signed)
Occupational Therapy Evaluation Patient Details Name: Chad Berry MRN: [id number] DOB: [birth year] Today's Date: 10/23/2020    History of Present Illness 80 y.o. male admitted on 10/22/20 for palpitations, left sided chest pain.  Pt dx with R LE DVT and suspected PE, sub therapeutic INR, elevated BPs, orthostasis.  Pt started on Heparin and MD is in discussion with cardiac surgeon as to which oral blood thinner to transition him to prior to d/c (he was on coumadin).  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   Clinical Impression   Patient admitted for the above diagnosis.  PTA he lives with his wife, who can assist as needed.  Dizziness is his only barrier, and he was asymptomatic with OT.  RN has been taking orthostatics.  Currently he is close to his baseline, and no acute OT needs have been identified.  Recommend post acute rehab as recommended by MD.    Follow Up Recommendations  No OT follow up    Equipment Recommendations  None recommended by OT    Recommendations for Other Services       Precautions / Restrictions Precautions Precaution Comments: orthostasis Restrictions Weight Bearing Restrictions: No      Mobility Bed Mobility Overal bed mobility: Independent               Patient Response: Cooperative  Transfers Overall transfer level: Modified independent               General transfer comment: pushing Iv pole    Balance Overall balance assessment: No apparent balance deficits (not formally assessed)                                         ADL either performed or assessed with clinical judgement   ADL Overall ADL's : At baseline                                             Vision Baseline Vision/History: Wears glasses;Cataracts Wears Glasses: At all  times Patient Visual Report: No change from baseline       Perception     Praxis      Pertinent Vitals/Pain Pain Assessment: No/denies pain     Hand Dominance Right   Extremity/Trunk Assessment Upper Extremity Assessment Upper Extremity Assessment: Overall WFL for tasks assessed       Cervical / Trunk Assessment Cervical / Trunk Assessment: Normal   Communication Communication Communication: No difficulties   Cognition Arousal/Alertness: Awake/alert Behavior During Therapy: WFL for tasks assessed/performed Overall Cognitive Status: Within Functional Limits for tasks assessed                                      Home Living Family/patient expects to be discharged to:: Private residence Living Arrangements: Spouse/significant other Available Help at Discharge: Family Type of Home: House       Home Layout: Multi-level Alternate Level Stairs-Number of Steps: flight   Bathroom Shower/Tub: Producer, television/film/video: Standard     Home Equipment: None          Prior Functioning/Environment Level of  Independence: Independent                 OT Problem List: Decreased activity tolerance      OT Treatment/Interventions:      OT Goals(Current goals can be found in the care plan section) Acute Rehab OT Goals Patient Stated Goal: Return home when cleared OT Goal Formulation: With patient Time For Goal Achievement: 10/23/20 Potential to Achieve Goals: Good  OT Frequency:     Barriers to D/C:            Co-evaluation              AM-PAC OT "6 Clicks" Daily Activity     Outcome Measure Help from another person eating meals?: None Help from another person taking care of personal grooming?: None Help from another person toileting, which includes using toliet, bedpan, or urinal?: None Help from another person bathing (including washing, rinsing, drying)?: None Help from another person to put on and taking off regular upper  body clothing?: None Help from another person to put on and taking off regular lower body clothing?: None 6 Click Score: 24   End of Session Nurse Communication: Mobility status  Activity Tolerance: Patient tolerated treatment well Patient left: in bed;with call bell/phone within reach;with family/visitor present  OT Visit Diagnosis: Dizziness and giddiness (R42)                Time: 6073-7106 OT Time Calculation (min): 19 min Charges:  OT General Charges $OT Visit: 1 Visit OT Evaluation $OT Eval Moderate Complexity: 1 Mod  10/23/2020  Rich, OTR/L  Acute Rehabilitation Services  Office:  [phone]   Suzanna Obey 10/23/2020, 5:30 PM

## 2020-10-24 LAB — BASIC METABOLIC PANEL
Anion gap: 6 (ref 5–15)
BUN: 11 mg/dL (ref 8–23)
CO2: 26 mmol/L (ref 22–32)
Calcium: 8.7 mg/dL — ABNORMAL LOW (ref 8.9–10.3)
Chloride: 105 mmol/L (ref 98–111)
Creatinine, Ser: 1.21 mg/dL (ref 0.61–1.24)
GFR, Estimated: >60 mL/min (ref >60)
Glucose, Bld: 101 mg/dL — ABNORMAL HIGH (ref 70–99)
Potassium: 4.2 mmol/L (ref 3.5–5.1)
Sodium: 137 mmol/L (ref 135–145)

## 2020-10-24 LAB — CBC
HCT: 34.7 % — ABNORMAL LOW (ref 39.0–52.0)
Hemoglobin: 11.8 g/dL — ABNORMAL LOW (ref 13.0–17.0)
MCH: 31.9 pg (ref 26.0–34.0)
MCHC: 34 g/dL (ref 30.0–36.0)
MCV: 93.8 fL (ref 80.0–100.0)
Platelets: 100 10*3/uL — ABNORMAL LOW (ref 150–400)
RBC: 3.7 MIL/uL — ABNORMAL LOW (ref 4.22–5.81)
RDW: 14.3 % (ref 11.5–15.5)
WBC: 5 10*3/uL (ref 4.0–10.5)
nRBC: 0 % (ref 0.0–0.2)

## 2020-10-24 MED ORDER — APIXABAN 5 MG PO TABS: 10.0000 mg | ORAL_TABLET | Freq: Two times a day (BID) | ORAL | 0 refills | Status: DC

## 2020-10-24 MED ORDER — APIXABAN (ELIQUIS) VTE STARTER PACK (10MG AND 5MG): ORAL_TABLET | ORAL | 0 refills | Status: DC

## 2020-10-24 MED ORDER — LOSARTAN POTASSIUM 50 MG PO TABS: 100.0000 mg | ORAL_TABLET | Freq: Every day | ORAL | 0 refills | Status: DC

## 2020-10-24 NOTE — Plan of Care (Signed)

## 2020-10-24 NOTE — Progress Notes (Signed)
Progress Note  Patient Name: Chad Berry Date of Encounter: 10/24/2020  Primary Cardiologist: Chad Batty, MD   Subjective   Feels well. No chest pain or sob  Inpatient Medications    Scheduled Meds:  amiodarone  200 mg Oral BID   apixaban  10 mg Oral BID   Followed by   Melene Muller ON 10/30/2020] apixaban  5 mg Oral BID   aspirin  81 mg Oral Daily   ezetimibe  10 mg Oral QHS   levothyroxine  75 mcg Oral Q0600   losartan  100 mg Oral Daily   rosuvastatin  40 mg Oral QHS   Continuous Infusions:  PRN Meds: acetaminophen, hydrALAZINE, melatonin, morphine injection, ondansetron (ZOFRAN) IV, oxyCODONE, polyethylene glycol   Vital Signs    Vitals:   10/23/20 1927 10/23/20 1930 10/23/20 2304 10/24/20 0500  BP: 120/85  119/68 132/84  Pulse: 72 78 64 66  Resp: 17 17 18 18   Temp:   98.1 F (36.7 C) 98.4 F (36.9 C)  TempSrc:   Oral Oral  SpO2: 95% 95% 96% 97%  Weight:      Height:        Intake/Output Summary (Last 24 hours) at 10/24/2020 0859 Last data filed at 10/23/2020 1700 Gross per 24 hour  Intake 209.75 ml  Output --  Net 209.75 ml   Filed Weights   10/22/20 1941 10/23/20 0130  Weight: 94.6 kg 94.8 kg    Telemetry    NSR - Personally Reviewed  ECG    none - Personally Reviewed  Physical Exam   GEN: No acute distress.   Neck: No JVD Cardiac: RRR, no murmurs, rubs, or gallops.  Respiratory: Clear to auscultation bilaterally. GI: Soft, nontender, non-distended  MS: No edema; No deformity. Neuro:  Nonfocal  Psych: Normal affect   Labs    Chemistry Recent Labs  Lab 10/22/20 2055 10/23/20 0706 10/24/20 0033  NA 136 137 137  K 4.0 4.2 4.2  CL 103 105 105  CO2 26 23 26   GLUCOSE 125* 104* 101*  BUN 15 11 11   CREATININE 1.25* 1.12 1.21  CALCIUM 8.5* 8.8* 8.7*  GFRNONAA 59* >60 >60  ANIONGAP 7 9 6      Hematology Recent Labs  Lab 10/22/20 2055 10/23/20 0706 10/24/20 0033  WBC 6.8 5.7 5.0  RBC 3.98* 3.71* 3.70*  HGB 12.7* 11.8* 11.8*   HCT 37.6* 34.5* 34.7*  MCV 94.5 93.0 93.8  MCH 31.9 31.8 31.9  MCHC 33.8 34.2 34.0  RDW 14.3 14.0 14.3  PLT 127* 114* 100*    Cardiac EnzymesNo results for input(s): TROPONINI in the last 168 hours. No results for input(s): TROPIPOC in the last 168 hours.   BNPNo results for input(s): BNP, PROBNP in the last 168 hours.   DDimer  Recent Labs  Lab 10/22/20 2038  DDIMER 9.13*     Radiology    DG Chest 2 View  Result Date: 10/22/2020 CLINICAL DATA:  Palpitations. EXAM: CHEST - 2 VIEW COMPARISON:  October 11, 2020 FINDINGS: Stable linear scarring is noted within the mid right lung. The left lung is clear. A small, stable right pleural effusion is seen. No pneumothorax is identified. The heart size and mediastinal contours are within normal limits. An artificial mitral valve is noted. The visualized skeletal structures are unremarkable. IMPRESSION: 1. Stable linear scarring within the mid right lung. 2. Small, stable right pleural effusion. Electronically Signed   By: 10/24/20 M.D.   On: 10/22/2020 21:47  CT Head Wo Contrast  Result Date: 10/22/2020 CLINICAL DATA:  Palpitations. EXAM: CT HEAD WITHOUT CONTRAST TECHNIQUE: Contiguous axial images were obtained from the base of the skull through the vertex without intravenous contrast. COMPARISON:  June 28, 2020 FINDINGS: Brain: There is mild cerebral atrophy with widening of the extra-axial spaces and ventricular dilatation. There are areas of decreased attenuation within the white matter tracts of the supratentorial brain, consistent with microvascular disease changes. Vascular: No hyperdense vessel or unexpected calcification. Skull: Normal. Negative for fracture or focal lesion. Sinuses/Orbits: No acute finding. Other: None. IMPRESSION: 1. Generalized cerebral atrophy. 2. No acute intracranial abnormality. Electronically Signed   By: Aram Candela M.D.   On: 10/22/2020 21:56   US Venous Img Lower Right (DVT Study)  Result  Date: 10/22/2020 CLINICAL DATA:  Right leg swelling. EXAM: RIGHT LOWER EXTREMITY VENOUS DOPPLER ULTRASOUND TECHNIQUE: Gray-scale sonography with compression, as well as color and duplex ultrasound, were performed to evaluate the deep venous system(s) from the level of the common femoral vein through the popliteal and proximal calf veins. COMPARISON:  None. FINDINGS: VENOUS Abnormal compressibility of the RIGHT common femoral, superficial femoral, and popliteal veins. Visualized portions of the RIGHT profunda femoral vein and RIGHT great saphenous vein are also abnormal. Filling defects suggestive of DVT are seen on grayscale and color Doppler imaging. Doppler waveforms show abnormal direction of venous flow, abnormal respiratory plasticity and abnormal response to augmentation. The visualized RIGHT calf veins are unremarkable. Limited views of the contralateral common femoral vein are unremarkable. OTHER None. Limitations: none IMPRESSION: Findings consistent with occlusive RIGHT lower extremity DVT from the RIGHT common femoral vein to the RIGHT popliteal vein. Electronically Signed   By: Aram Candela M.D.   On: 10/22/2020 21:44    Cardiac Studies   See above  Patient Profile     80 y.o. male admitted with acute DVT suspected PE  Assessment & Plan    DVT - he has been switched from IV heparin to eliquis. Continue MR - he is s/p MV repair. Doing well. PAF - he will continue amiodarone. 200 mg bid for 7 more days then 200 mg daily Dyslipidemia - continue crestor. [facility] will sign off.   Medication Recommendations:  continue current meds Other recommendations (labs, testing, etc):  followup Dr. Hazle Coca office in 2 weeks for cbc with diff and bmp Follow up as an outpatient:  F/u with me in 3-4 weeks.  For questions or updates, please contact [facility] Please consult [email or web address] for contact info under Cardiology/STEMI.      Signed, Chad Bunting, MD  10/24/2020, 8:59 AM    Patient ID: Chad Berry, male   DOB: August 23, 1940, 80 y.o.   MRN: [id number]

## 2020-10-24 NOTE — Discharge Summary (Signed)
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 amiodarone, history of bradycardia.  He presented to the emergency department with chest palpitations and left-sided chest pain.  In the emergency department his INR was subtherapeutic.  2 sets of troponin were normal however he has elevated D-dimer.  The patient was transferred to [facility] for a VQ scan.  CTA was unable to be obtained due to allergy to IV contrast.  Duplex ultrasound was positive for right lower extremity DVT.  He was started on a heparin drip.  Cardiology was consulted and admitted by the hospitalist team.  Cardiology discussed with surgery and the patient was transitioned from Coumadin to Eliquis.  VQ scan was canceled by cardiology as it would not change the management of the patient.  Left-sided chest pain was likely due to suspected PE.  The patient's blood pressure was not at goal and his losartan was increased to 100 mg daily.  Chest pain has resolved now.  He is feeling better.  Cleared from a cardiology perspective for discharge.  Follow-up with cardiology and surgery.  Discharge Diagnoses:  Active Problems:   Chest pain    Discharge Instructions  Discharge Instructions     Call MD for:  difficulty breathing, headache or visual disturbances   Complete by: As directed    Diet - low sodium heart healthy   Complete  by: As directed    Increase activity slowly   Complete by: As directed    No wound care   Complete by: As directed       Allergies as of 10/24/2020       Reactions   Contrast Media [iodinated Diagnostic Agents] Hives, Nausea And Vomiting, Cough   Pt will need a 13 hour prep before any contrast per Dr. Paulina Fusi (05/14/19)   Hctz [hydrochlorothiazide] Other (See Comments)   palpitations   Metoprolol Succinate [metoprolol] Other (See Comments)   Bradycardia        Medication List     STOP taking these medications    warfarin 2 MG tablet Commonly known as: COUMADIN       TAKE these medications    acetaminophen 500 MG tablet Commonly known as: TYLENOL Take 1,000 mg by mouth every 6 (six) hours as needed for mild pain or moderate pain.   amiodarone 200 MG tablet Commonly known as: PACERONE Take 1 tablet (200 mg total) by mouth 2 (two) times daily. X 7 days, then decrease to 200 mg daily   Apixaban Starter Pack (  and ) Commonly known as: ELIQUIS STARTER PACK Take as directed on package: start with two-5mg  tablets twice daily for 7 days. On day 8, switch to one-5mg  tablet twice daily.   aspirin 81 MG EC tablet Take 1 tablet (81 mg total) by mouth daily. Swallow whole.   diphenhydramine-acetaminophen 25-500 MG Tabs tablet Commonly known as: TYLENOL PM Take 1 tablet by mouth at bedtime as needed (sleep).   ezetimibe 10 MG tablet Commonly known as: ZETIA Take 10 mg by  mouth at bedtime.   fluticasone 50 MCG/ACT nasal spray Commonly known as: FLONASE Place 1 spray into both nostrils at bedtime.   levothyroxine 75 MCG tablet Commonly known as: SYNTHROID Take 75 mcg by mouth at bedtime.   losartan 50 MG tablet Commonly known as: Cozaar Take 2 tablets (100 mg total) by mouth daily. Start taking on: October 25, 2020 What changed: how much to take   rosuvastatin 40 MG tablet Commonly known as: CRESTOR Take 40 mg by mouth at bedtime.   sodium chloride  0.65 % Soln nasal spray Commonly known as: OCEAN Place 1 spray into both nostrils as needed for congestion.   vitamin C 1000 MG tablet Take 1,000 mg by mouth daily.   Vitamin D 50 MCG (2000 UT) tablet Take 2,000 Units by mouth daily.        Follow-up Information     Mosetta Putt, MD Follow up in 1 week(s).   Specialty: Family Medicine Contact information: [address] [place] [address] [phone]         Runell Gess, MD .   Specialties: Cardiology, Radiology Contact information: [address] [place] [address] [phone]         Marinus Maw, MD Follow up in 3 week(s).   Specialty: Cardiology Contact information: [address] [place] [address] [phone]                Allergies  Allergen Reactions   Contrast Media [Iodinated Diagnostic Agents] Hives, Nausea And Vomiting and Cough    Pt will need a 13 hour prep before any contrast per Dr. Paulina Fusi (05/14/19)   Hctz [Hydrochlorothiazide] Other (See Comments)    palpitations   Metoprolol Succinate [Metoprolol] Other (See Comments)    Bradycardia      Consultations: Cardiology   Procedures/Studies: DG Chest 2 View  Result Date: 10/22/2020 CLINICAL DATA:  Palpitations. EXAM: CHEST - 2 VIEW COMPARISON:  October 11, 2020 FINDINGS: Stable linear scarring is noted within the mid right lung. The left lung is clear. A small, stable right pleural effusion is seen. No pneumothorax is identified. The heart size and mediastinal contours are within normal limits. An artificial mitral valve is noted. The visualized skeletal structures are unremarkable. IMPRESSION: 1. Stable linear scarring within the mid right lung. 2. Small, stable right pleural effusion. Electronically Signed   By: Aram Candela M.D.   On: 10/22/2020 21:47   DG Chest 2 View  Result Date: 10/11/2020 CLINICAL DATA:  Status post mitral valve replacement EXAM: CHEST - 2  VIEW COMPARISON:  Chest radiograph 10/04/2020 FINDINGS: Unchanged cardiomediastinal silhouette with mitral valve repair. Stable right lung scarring. There is a small right pleural effusion. No visible pneumothorax. The left lung is clear. No acute osseous abnormality. IMPRESSION: Small right pleural effusion. Unchanged atelectasis/scarring in the right midlung. No new focal airspace disease. Electronically Signed   By: Caprice Renshaw   On: 10/11/2020 12:52   DG Chest 2 View  Result Date: 10/04/2020 CLINICAL DATA:  Recent mitral valve repair. EXAM: CHEST - 2 VIEW COMPARISON:  Chest x-ray dated Sep 27, 2020. FINDINGS: Normal heart size status post mitral valve repair. Normal pulmonary vascularity. Improved aeration at the lung bases. Unchanged atelectasis/scarring in the right mid lung. Trace right pleural effusion. No consolidation or pneumothorax. No acute osseous abnormality. IMPRESSION: 1. Trace right pleural effusion. 2. Unchanged atelectasis/scarring in the right mid lung. Electronically Signed   By: Obie Dredge  M.D.   On: 10/04/2020 12:32   DG Chest 2 View  Result Date: 09/27/2020 CLINICAL DATA:  Status post mitral valve repair.  Follow-up exam. EXAM: CHEST - 2 VIEW COMPARISON:  09/26/2020 and older studies. FINDINGS: Right mid lung and bilateral lung base opacities have improved from the previous day's exam consistent with improving atelectasis. Remainder of the lungs is clear. Suspect small effusions, likely decreased from the previous day's exam. No pneumothorax. No mediastinal widening. IMPRESSION: 1. No acute findings or evidence of an operative complication. 2. Interval improvement with decreased right mid and bilateral lung base atelectasis likely with a decreased in small pleural effusions. No evidence pulmonary edema, no pneumothorax and no mediastinal widening. Electronically Signed   By: Amie Portlandavid  Ormond M.D.   On: 09/27/2020 08:16   CT Head Wo Contrast  Result Date: 10/22/2020 CLINICAL  DATA:  Palpitations. EXAM: CT HEAD WITHOUT CONTRAST TECHNIQUE: Contiguous axial images were obtained from the base of the skull through the vertex without intravenous contrast. COMPARISON:  June 28, 2020 FINDINGS: Brain: There is mild cerebral atrophy with widening of the extra-axial spaces and ventricular dilatation. There are areas of decreased attenuation within the white matter tracts of the supratentorial brain, consistent with microvascular disease changes. Vascular: No hyperdense vessel or unexpected calcification. Skull: Normal. Negative for fracture or focal lesion. Sinuses/Orbits: No acute finding. Other: None. IMPRESSION: 1. Generalized cerebral atrophy. 2. No acute intracranial abnormality. Electronically Signed   By: Aram Candelahaddeus  Houston M.D.   On: 10/22/2020 21:56   US Venous Img Lower Right (DVT Study)  Result Date: 10/22/2020 CLINICAL DATA:  Right leg swelling. EXAM: RIGHT LOWER EXTREMITY VENOUS DOPPLER ULTRASOUND TECHNIQUE: Gray-scale sonography with compression, as well as color and duplex ultrasound, were performed to evaluate the deep venous system(s) from the level of the common femoral vein through the popliteal and proximal calf veins. COMPARISON:  None. FINDINGS: VENOUS Abnormal compressibility of the RIGHT common femoral, superficial femoral, and popliteal veins. Visualized portions of the RIGHT profunda femoral vein and RIGHT great saphenous vein are also abnormal. Filling defects suggestive of DVT are seen on grayscale and color Doppler imaging. Doppler waveforms show abnormal direction of venous flow, abnormal respiratory plasticity and abnormal response to augmentation. The visualized RIGHT calf veins are unremarkable. Limited views of the contralateral common femoral vein are unremarkable. OTHER None. Limitations: none IMPRESSION: Findings consistent with occlusive RIGHT lower extremity DVT from the RIGHT common femoral vein to the RIGHT popliteal vein. Electronically Signed   By:  Aram Candelahaddeus  Houston M.D.   On: 10/22/2020 21:44   DG Chest Port 1 View  Result Date: 09/26/2020 CLINICAL DATA:  80 year old male status post mitral valve repair. EXAM: PORTABLE CHEST 1 VIEW COMPARISON:  Chest x-ray 09/24/2020. FINDINGS: Right internal jugular Cordis in position with tip terminating in the right internal jugular vein. Epicardial pacing wires are noted. Lung volumes are low. Patchy opacities throughout the mid to lower lungs bilaterally which likely reflect predominantly subsegmental atelectasis, although underlying airspace consolidation is difficult to entirely exclude. Small bilateral pleural effusions. No definite pneumothorax. No definite pulmonary edema. Heart size is mildly enlarged. Upper mediastinal contours are distorted by patient positioning. Postoperative changes of mitral valve repair are noted. IMPRESSION: 1. Postoperative changes and support apparatus, as above. 2. Low lung volumes with bibasilar areas of atelectasis and/or consolidation and small bilateral pleural effusions. 3. Mild cardiomegaly. Electronically Signed   By: Trudie Reedaniel  Entrikin M.D.   On: 09/26/2020 08:10      Discharge Exam: Vitals:  10/24/20 0856 10/24/20 1121  BP:  126/77  Pulse: 68 67  Resp: 17 11  Temp:  98.2 F (36.8 C)  SpO2:  95%   Vitals:   10/24/20 0500 10/24/20 0855 10/24/20 0856 10/24/20 1121  BP: 132/84   126/77  Pulse: 66 68 68 67  Resp: 18 16 17 11   Temp: 98.4 F (36.9 C)   98.2 F (36.8 C)  TempSrc: Oral   Oral  SpO2: 97% 94%  95%  Weight:      Height:        General: Pt is alert, awake, not in acute distress Cardiovascular: RRR, S1/S2 +, no rubs, no gallops Respiratory: CTA bilaterally, no wheezing, no rhonchi Abdominal: Soft, NT, ND, bowel sounds + Extremities: no edema, no cyanosis    The results of significant diagnostics from this hospitalization (including imaging, microbiology, ancillary and laboratory) are listed below for reference.      Microbiology: Recent Results (from the past 240 hour(s))  Resp Panel by RT-PCR (Flu A&B, Covid) Nasopharyngeal Swab     Status: None   Collection Time: 10/22/20 10:55 PM   Specimen: Nasopharyngeal Swab; Nasopharyngeal(NP) swabs in vial transport medium  Result Value Ref Range Status   SARS Coronavirus 2 by RT PCR NEGATIVE NEGATIVE Final    Comment: (NOTE) SARS-CoV-2 target nucleic acids are NOT DETECTED.  The SARS-CoV-2 RNA is generally detectable in upper respiratory specimens during the acute phase of infection. The lowest concentration of SARS-CoV-2 viral copies this assay can detect is 138 copies/mL. A negative result does not preclude SARS-Cov-2 infection and should not be used as the sole basis for treatment or other patient management decisions. A negative result may occur with  improper specimen collection/handling, submission of specimen other than nasopharyngeal swab, presence of viral mutation(s) within the areas targeted by this assay, and inadequate number of viral copies(<138 copies/mL). A negative result must be combined with clinical observations, patient history, and epidemiological information. The expected result is Negative.  Fact Sheet for Patients:  10/24/20  Fact Sheet for Healthcare Providers:  BloggerCourse.com  This test is no t yet approved or cleared by the [place] FDA and  has been authorized for detection and/or diagnosis of SARS-CoV-2 by FDA under an Emergency Use Authorization (EUA). This EUA will remain  in effect (meaning this test can be used) for the duration of the COVID-19 declaration under Section 564(b)(1) of the Act, 21 U.S.C.section 360bbb-3(b)(1), unless the authorization is terminated  or revoked sooner.       Influenza A by PCR NEGATIVE NEGATIVE Final   Influenza B by PCR NEGATIVE NEGATIVE Final    Comment: (NOTE) The Xpert Xpress SARS-CoV-2/FLU/RSV plus assay is  intended as an aid in the diagnosis of influenza from Nasopharyngeal swab specimens and should not be used as a sole basis for treatment. Nasal washings and aspirates are unacceptable for Xpert Xpress SARS-CoV-2/FLU/RSV testing.  Fact Sheet for Patients: [place]  Fact Sheet for Healthcare Providers: BloggerCourse.com  This test is not yet approved or cleared by the [place] FDA and has been authorized for detection and/or diagnosis of SARS-CoV-2 by FDA under an Emergency Use Authorization (EUA). This EUA will remain in effect (meaning this test can be used) for the duration of the COVID-19 declaration under Section 564(b)(1) of the Act, 21 U.S.C. section 360bbb-3(b)(1), unless the authorization is terminated or revoked.  Performed at [facility], [address] [place], [place]   MRSA Next Gen by PCR,  Nasal     Status: None   Collection Time: 10/23/20  2:00 AM   Specimen: Nasal Mucosa; Nasal Swab  Result Value Ref Range Status   MRSA by PCR Next Gen NOT DETECTED NOT DETECTED Final    Comment: (NOTE) The GeneXpert MRSA Assay (FDA approved for NASAL specimens only), is one component of a comprehensive MRSA colonization surveillance program. It is not intended to diagnose MRSA infection nor to guide or monitor treatment for MRSA infections. Test performance is not FDA approved in patients less than 72 years old. Performed at [facility], [address], [place], [place] [address]      Labs: BNP (last 3 results) No results for input(s): BNP in the last 8760 hours. Basic Metabolic Panel: Recent Labs  Lab 10/22/20 2038 10/22/20 2055 10/23/20 0706 10/24/20 0033  NA  --  136 137 137  K  --  4.0 4.2 4.2  CL  --  103 105 105  CO2  --  26 23 26   GLUCOSE  --  125* 104* 101*  BUN  --  15 11 11   CREATININE  --  1.25* 1.12 1.21  CALCIUM  --  8.5* 8.8* 8.7*  MG 2.1  --  1.9  --    PHOS  --   --  3.2  --    Liver Function Tests: No results for input(s): AST, ALT, ALKPHOS, BILITOT, PROT, ALBUMIN in the last 168 hours. No results for input(s): LIPASE, AMYLASE in the last 168 hours. No results for input(s): AMMONIA in the last 168 hours. CBC: Recent Labs  Lab 10/22/20 2055 10/23/20 0706 10/24/20 0033  WBC 6.8 5.7 5.0  NEUTROABS 5.2  --   --   HGB 12.7* 11.8* 11.8*  HCT 37.6* 34.5* 34.7*  MCV 94.5 93.0 93.8  PLT 127* 114* 100*   Cardiac Enzymes: No results for input(s): CKTOTAL, CKMB, CKMBINDEX, TROPONINI in the last 168 hours. BNP: Invalid input(s): POCBNP CBG: No results for input(s): GLUCAP in the last 168 hours. D-Dimer Recent Labs    10/22/20 2038  DDIMER 9.13*   Hgb A1c No results for input(s): HGBA1C in the last 72 hours. Lipid Profile No results for input(s): CHOL, HDL, LDLCALC, TRIG, CHOLHDL, LDLDIRECT in the last 72 hours. Thyroid function studies No results for input(s): TSH, T4TOTAL, T3FREE, THYROIDAB in the last 72 hours.  Invalid input(s): FREET3 Anemia work up No results for input(s): VITAMINB12, FOLATE, FERRITIN, TIBC, IRON, RETICCTPCT in the last 72 hours. Urinalysis    Component Value Date/Time   COLORURINE YELLOW 09/20/2020 1427   APPEARANCEUR CLEAR 09/20/2020 1427   LABSPEC 1.021 09/20/2020 1427   PHURINE 5.0 09/20/2020 1427   GLUCOSEU NEGATIVE 09/20/2020 1427   HGBUR SMALL (A) 09/20/2020 1427   BILIRUBINUR NEGATIVE 09/20/2020 1427   KETONESUR NEGATIVE 09/20/2020 1427   PROTEINUR NEGATIVE 09/20/2020 1427   NITRITE NEGATIVE 09/20/2020 1427   LEUKOCYTESUR NEGATIVE 09/20/2020 1427   Sepsis Labs Invalid input(s): PROCALCITONIN,  WBC,  LACTICIDVEN Microbiology Recent Results (from the past 240 hour(s))  Resp Panel by RT-PCR (Flu A&B, Covid) Nasopharyngeal Swab     Status: None   Collection Time: 10/22/20 10:55 PM   Specimen: Nasopharyngeal Swab; Nasopharyngeal(NP) swabs in vial transport medium  Result Value Ref Range  Status   SARS Coronavirus 2 by RT PCR NEGATIVE NEGATIVE Final    Comment: (NOTE) SARS-CoV-2 target nucleic acids are NOT DETECTED.  The SARS-CoV-2 RNA is generally detectable in upper respiratory specimens during the acute phase  of infection. The lowest concentration of SARS-CoV-2 viral copies this assay can detect is 138 copies/mL. A negative result does not preclude SARS-Cov-2 infection and should not be used as the sole basis for treatment or other patient management decisions. A negative result may occur with  improper specimen collection/handling, submission of specimen other than nasopharyngeal swab, presence of viral mutation(s) within the areas targeted by this assay, and inadequate number of viral copies(<138 copies/mL). A negative result must be combined with clinical observations, patient history, and epidemiological information. The expected result is Negative.  Fact Sheet for Patients:  BloggerCourse.com  Fact Sheet for Healthcare Providers:  [employer]iousBroker.it  This test is no t yet approved or cleared by the [place] FDA and  has been authorized for detection and/or diagnosis of SARS-CoV-2 by FDA under an Emergency Use Authorization (EUA). This EUA will remain  in effect (meaning this test can be used) for the duration of the COVID-19 declaration under Section 564(b)(1) of the Act, 21 U.S.C.section 360bbb-3(b)(1), unless the authorization is terminated  or revoked sooner.       Influenza A by PCR NEGATIVE NEGATIVE Final   Influenza B by PCR NEGATIVE NEGATIVE Final    Comment: (NOTE) The Xpert Xpress SARS-CoV-2/FLU/RSV plus assay is intended as an aid in the diagnosis of influenza from Nasopharyngeal swab specimens and should not be used as a sole basis for treatment. Nasal washings and aspirates are unacceptable for Xpert Xpress SARS-CoV-2/FLU/RSV testing.  Fact Sheet for  Patients: BloggerCourse.com  Fact Sheet for Healthcare Providers: [employer]ious[employer]ker.it  This test is not yet approved or cleared by the [place] FDA and has been authorized for detection and/or diagnosis of SARS-CoV-2 by FDA under an Emergency Use Authorization (EUA). This EUA will remain in effect (meaning this test can be used) for the duration of the COVID-19 declaration under Section 564(b)(1) of the Act, 21 U.S.C. section 360bbb-3(b)(1), unless the authorization is terminated or revoked.  Performed at [facility], [address] [place], [place] [address]   MRSA Next Gen by PCR, Nasal     Status: None   Collection Time: 10/23/20  2:00 AM   Specimen: Nasal Mucosa; Nasal Swab  Result Value Ref Range Status   MRSA by PCR Next Gen NOT DETECTED NOT DETECTED Final    Comment: (NOTE) The GeneXpert MRSA Assay (FDA approved for NASAL specimens only), is one component of a comprehensive MRSA colonization surveillance program. It is not intended to diagnose MRSA infection nor to guide or monitor treatment for MRSA infections. Test performance is not FDA approved in patients less than 52 years old. Performed at [facility], [address] [place], [place] [address]      Time coordinating discharge: 45 minutes  SIGNED:   Verdia Kuba, MD  [facility] 10/24/2020, 1:37 PM Pager   If 7PM-7AM, please contact night-coverage [email or web address] Password [email or web address]

## 2020-10-24 NOTE — Discharge Instructions (Addendum)
1) Take Eliquis as prescribed for your DVT 2) We increased your dose of your Losartan 3) Follow up with PCP in 1 week 4) Follow up with Dr. Ladona Ridgel in 3-4 weeks 5) Follow up with Dr. Allyson Sabal in 2 weeks for CBC with diff and BMP   Information on my medicine - ELIQUIS (apixaban)  This medication education was reviewed with me or my healthcare representative as part of my discharge preparation.  T Why was Eliquis prescribed for you? Eliquis was prescribed to treat blood clots that may have been found in the veins of your legs (deep vein thrombosis) or in your lungs (pulmonary embolism) and to reduce the risk of them occurring again.  What do You need to know about Eliquis ? The starting dose is 10 mg (two 5 mg tablets) taken TWICE daily for the FIRST SEVEN (7) DAYS, then on the dose is reduced to ONE 5 mg tablet taken TWICE daily.  Eliquis may be taken with or without food.   Try to take the dose about the same time in the morning and in the evening. If you have difficulty swallowing the tablet whole please discuss with your pharmacist how to take the medication safely.  Take Eliquis exactly as prescribed and DO NOT stop taking Eliquis without talking to the doctor who prescribed the medication.  Stopping may increase your risk of developing a new blood clot.  Refill your prescription before you run out.  After discharge, you should have regular check-up appointments with your healthcare provider that is prescribing your Eliquis.    What do you do if you miss a dose? If a dose of ELIQUIS is not taken at the scheduled time, take it as soon as possible on the same day and twice-daily administration should be resumed. The dose should not be doubled to make up for a missed dose.  Important Safety Information A possible side effect of Eliquis is bleeding. You should call your healthcare provider right away if you experience any of the following: Bleeding from an injury or your nose that  does not stop. Unusual colored urine (red or dark brown) or unusual colored stools (red or black). Unusual bruising for unknown reasons. A serious fall or if you hit your head (even if there is no bleeding).  Some medicines may interact with Eliquis and might increase your risk of bleeding or clotting while on Eliquis. To help avoid this, consult your healthcare provider or pharmacist prior to using any new prescription or non-prescription medications, including herbals, vitamins, non-steroidal anti-inflammatory drugs (NSAIDs) and supplements.  This website has more information on Eliquis (apixaban): http://www.eliquis.com/eliquis/home

## 2020-10-24 NOTE — Progress Notes (Signed)
ANTICOAGULATION CONSULT NOTE - Follow Up Consult  Pharmacy Consult for Coumadin/heparin > apixaban  Indication:  recent h/o Afib and MV repair w/ new DVT while INR subtherapeutic and concern for PE  Allergies  Allergen Reactions   Contrast Media [Iodinated Diagnostic Agents] Hives, Nausea And Vomiting and Cough    Pt will need a 13 hour prep before any contrast per Dr. Paulina Fusi (05/14/19)   Hctz [Hydrochlorothiazide] Other (See Comments)    palpitations   Metoprolol Succinate [Metoprolol] Other (See Comments)    Bradycardia      Patient Measurements: Height: 5\' 10"  (177.8 cm) Weight: 94.8 kg (208 lb 15.9 oz) IBW/kg (Calculated) : 73  Vital Signs: Temp: 98.2 F (36.8 C) (06/26 1121) Temp Source: Oral (06/26 1121) BP: 126/77 (06/26 1121) Pulse Rate: 67 (06/26 1121)  Labs: Recent Labs    10/22/20 2055 10/22/20 2255 10/23/20 0706 10/24/20 0033  HGB 12.7*  --  11.8* 11.8*  HCT 37.6*  --  34.5* 34.7*  PLT 127*  --  114* 100*  LABPROT 19.9*  --  20.1*  --   INR 1.7*  --  1.7*  --   HEPARINUNFRC  --   --  0.48  --   CREATININE 1.25*  --  1.12 1.21  TROPONINIHS 10 17  --   --      Estimated Creatinine Clearance: 59.1 mL/min (by C-G formula based on SCr of 1.21 mg/dL).   Medical History: Past Medical History:  Diagnosis Date   Aortic atherosclerosis (HCC)    Bradycardia    while on BB with HR to the 40s   Carotid stenosis    CAROTID DOPPLER, 12/07/2011 - Mild arthrosclerotic changes, no high-grade stenosis   Cataract    rt. eye   Clostridium difficile infection 05/2019   Diverticulitis    Heart murmur    History of echocardiogram    Echo 9/16:  EF 60-65%, no RWMA, Gr 1 DD, holosystolic MVP of posterior leaflet, mild MR, LA upper limits of normal, normal RVF, mod TR, PASP 50 mmHg   Hypercholesteremia    Hyperlipidemia    Hypertension    Hypothyroidism    MVP (mitral valve prolapse)    a. Echo 12/07/2011 - EF-65-70%, severe mitral regurgitation, moderate-severe  tricuspid regurgitation, moderate pulmonary HTN, moderate pulmonic regurgitation;  b. 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   unk   amiodarone (PACERONE) 200 MG tablet Take 1 tablet (200 mg total) by mouth 2 (two) times daily. X 7 days, then decrease to 200 mg daily 60 tablet 3 10/22/2020   Ascorbic Acid (VITAMIN C) 1000 MG tablet Take 1,000 mg by mouth daily.   10/22/2020   aspirin EC  81 MG EC tablet Take 1 tablet (81 mg total) by mouth daily. Swallow whole. 30 tablet 11 10/22/2020   Cholecalciferol (VITAMIN D) 50 MCG (2000 UT) tablet Take 2,000 Units by mouth daily.   10/22/2020   diphenhydramine-acetaminophen (TYLENOL PM) 25-500 MG TABS tablet Take 1 tablet by mouth at bedtime as needed (sleep).   Past Week   ezetimibe (ZETIA) 10 MG tablet Take 10 mg by mouth at bedtime.   10/22/2020   levothyroxine (SYNTHROID) 75 MCG tablet Take 75 mcg by mouth at bedtime.   10/22/2020   losartan (COZAAR) 50 MG tablet Take 1 tablet (50 mg total) by mouth daily. 30 tablet 3 10/22/2020   rosuvastatin (CRESTOR) 40 MG tablet Take 40 mg by mouth at bedtime.   10/22/2020   sodium chloride (OCEAN) 0.65 % SOLN nasal spray Place 1 spray into  both nostrils as needed for congestion.   Past Month   warfarin (COUMADIN) 2 MG tablet Take 1 tablet (2 mg total) by mouth daily at 4 PM. 30 tablet 3 10/22/2020 at 1600   fluticasone (FLONASE) 50 MCG/ACT nasal spray Place 1 spray into both nostrils at bedtime.      Scheduled:   amiodarone  200 mg Oral BID   apixaban  10 mg Oral BID   Followed by   Melene Muller ON 10/30/2020] apixaban  5 mg Oral BID   aspirin  81 mg Oral Daily   ezetimibe  10 mg Oral QHS   levothyroxine  75 mcg Oral Q0600   losartan  100 mg Oral Daily   rosuvastatin  40 mg Oral QHS   Infusions:     Assessment: 80yo male c/o HA and elevated BP on presentation to ED, admitted to palpitations and CP during ED w/u >> D-dimer was found to be elevated >> US reveals RLE DVT but contrast allergy prevents CT. Started on UFH and to continue Coumadin for Afib and MV repair 5/25 as well as new VTE; current INR below goal. Per cards today change to apixaban 6/25 and DC home 6/26 Patient educated   Goal of Therapy:  watch pltc    Plan:  Stop heparin at 2200 tonight and begin apixaban  Apixaban 10mg  BID x7 days then 5mg  BID    Pharm.D. CPP, BCPS Clinical Pharmacist [phone] 10/24/2020 12:19 PM

## 2020-10-25 ENCOUNTER — Telehealth: Payer: Self-pay | Admitting: Cardiovascular Disease

## 2020-10-25 NOTE — Telephone Encounter (Signed)
Pt updated with MD recommendations to continue with cardiac rehab and verbalized understanding.     I have no problem with him continuing with [facility]   JJB

## 2020-10-25 NOTE — Telephone Encounter (Signed)
   Pt c/o medication issue:  1. Name of Medication:   APIXABAN (ELIQUIS) VTE STARTER PACK (10MG  AND 5MG )    2. How are you currently taking this medication (dosage and times per day)? Take as directed on package: start with two-5mg  tablets twice daily for 7 days. On day 8, switch to one-5mg  tablet twice daily.  3. Are you having a reaction (difficulty breathing--STAT)?   4. What is your medication issue? Pt said this med was prescribed by Dr. while he was in the hospital dn would like to discuss with a nurse, he said he have some questions.

## 2020-10-25 NOTE — Telephone Encounter (Signed)
Pt made aware of Dr. Hazle Coca recommendations below and verbalized understanding. Pt also wanted to know if MD feels he should continue with cardiac rehab due to his last chest pain episode.    Dr. Hazle Coca recommendations: Eliquis would not be causing headache.  If there are no focal neurologic signs that he can take an analgesic and the headache persists see his primary care doctor.

## 2020-10-25 NOTE — Telephone Encounter (Signed)
Called pt back in regards to eliquis.  His main concern is that he wants to know if eliquis is causing a HA.  He reports that he was recently released from the hospital for a right DVT and questionable PE.  He says that around 9PM he developed a HA that went from ear to ear.  He took 500 mg Tylenol and HD resolved.  Today he reports left side pressure to head and face.  He denies tingling, decreased strength,  no facial drooping.  His BP is morning was 112/78 and about 30 min later 128/78-72.  He is worried that BP medication was increased to much.  Per pt his normal BP is 130/80.  He would like for Dr.Berry or nurse to call him back.  To tell him what he needs to do step by step.  Will route to MD's nurse.

## 2020-10-26 ENCOUNTER — Other Ambulatory Visit (HOSPITAL_COMMUNITY): Payer: Self-pay | Admitting: Rheumatology

## 2020-10-26 ENCOUNTER — Other Ambulatory Visit: Payer: Self-pay | Admitting: Family Medicine

## 2020-10-26 ENCOUNTER — Other Ambulatory Visit (HOSPITAL_COMMUNITY): Payer: Self-pay | Admitting: Family Medicine

## 2020-10-26 DIAGNOSIS — R079 Chest pain, unspecified: Secondary | ICD-10-CM

## 2020-10-26 DIAGNOSIS — I82A11 Acute embolism and thrombosis of right axillary vein: Secondary | ICD-10-CM

## 2020-10-27 ENCOUNTER — Other Ambulatory Visit (HOSPITAL_COMMUNITY): Payer: Self-pay | Admitting: Family Medicine

## 2020-10-27 DIAGNOSIS — R079 Chest pain, unspecified: Secondary | ICD-10-CM

## 2020-10-27 DIAGNOSIS — I2699 Other pulmonary embolism without acute cor pulmonale: Secondary | ICD-10-CM

## 2020-10-27 DIAGNOSIS — I82A11 Acute embolism and thrombosis of right axillary vein: Secondary | ICD-10-CM

## 2020-11-02 ENCOUNTER — Ambulatory Visit
Admission: RE | Admit: 2020-11-02 | Discharge: 2020-11-02 | Disposition: A | Discharge status: Home / Self Care | Payer: 59 | Source: Ambulatory Visit | Attending: Family Medicine | Admitting: Family Medicine

## 2020-11-02 ENCOUNTER — Other Ambulatory Visit: Payer: Self-pay | Admitting: Family Medicine

## 2020-11-02 ENCOUNTER — Other Ambulatory Visit: Payer: Self-pay

## 2020-11-02 DIAGNOSIS — R079 Chest pain, unspecified: Secondary | ICD-10-CM

## 2020-11-03 ENCOUNTER — Ambulatory Visit (HOSPITAL_COMMUNITY): Payer: 59

## 2020-11-03 ENCOUNTER — Encounter (HOSPITAL_COMMUNITY)
Admission: RE | Admit: 2020-11-03 | Discharge: 2020-11-03 | Disposition: A | Discharge status: Home / Self Care | Payer: 59 | Source: Ambulatory Visit | Attending: Family Medicine | Admitting: Family Medicine

## 2020-11-03 DIAGNOSIS — R079 Chest pain, unspecified: Secondary | ICD-10-CM | POA: Insufficient documentation

## 2020-11-03 DIAGNOSIS — I82A11 Acute embolism and thrombosis of right axillary vein: Secondary | ICD-10-CM | POA: Insufficient documentation

## 2020-11-03 DIAGNOSIS — I2699 Other pulmonary embolism without acute cor pulmonale: Secondary | ICD-10-CM | POA: Diagnosis present

## 2020-11-03 MED ORDER — TECHNETIUM TO 99M ALBUMIN AGGREGATED
4.0000 | Freq: Once | INTRAVENOUS | Status: AC
  Administered 2020-11-03: 4 via INTRAVENOUS

## 2020-11-08 ENCOUNTER — Encounter (HOSPITAL_BASED_OUTPATIENT_CLINIC_OR_DEPARTMENT_OTHER): Payer: Self-pay | Admitting: Obstetrics and Gynecology

## 2020-11-08 ENCOUNTER — Other Ambulatory Visit: Payer: Self-pay

## 2020-11-08 ENCOUNTER — Emergency Department (HOSPITAL_BASED_OUTPATIENT_CLINIC_OR_DEPARTMENT_OTHER)
Admission: EM | Admit: 2020-11-08 | Discharge: 2020-11-08 | Disposition: A | Discharge status: Home / Self Care | Payer: 59 | Attending: Emergency Medicine | Admitting: Emergency Medicine

## 2020-11-08 ENCOUNTER — Emergency Department (HOSPITAL_BASED_OUTPATIENT_CLINIC_OR_DEPARTMENT_OTHER): Payer: 59

## 2020-11-08 DIAGNOSIS — Z955 Presence of coronary angioplasty implant and graft: Secondary | ICD-10-CM | POA: Insufficient documentation

## 2020-11-08 DIAGNOSIS — R42 Dizziness and giddiness: Secondary | ICD-10-CM | POA: Diagnosis not present

## 2020-11-08 DIAGNOSIS — Z87891 Personal history of nicotine dependence: Secondary | ICD-10-CM | POA: Diagnosis not present

## 2020-11-08 DIAGNOSIS — Z7901 Long term (current) use of anticoagulants: Secondary | ICD-10-CM | POA: Insufficient documentation

## 2020-11-08 DIAGNOSIS — Z79899 Other long term (current) drug therapy: Secondary | ICD-10-CM | POA: Insufficient documentation

## 2020-11-08 DIAGNOSIS — E039 Hypothyroidism, unspecified: Secondary | ICD-10-CM | POA: Insufficient documentation

## 2020-11-08 DIAGNOSIS — I10 Essential (primary) hypertension: Secondary | ICD-10-CM | POA: Insufficient documentation

## 2020-11-08 DIAGNOSIS — R519 Headache, unspecified: Secondary | ICD-10-CM | POA: Diagnosis not present

## 2020-11-08 DIAGNOSIS — Z7982 Long term (current) use of aspirin: Secondary | ICD-10-CM | POA: Diagnosis not present

## 2020-11-08 LAB — COMPREHENSIVE METABOLIC PANEL
ALT: 59 U/L — ABNORMAL HIGH (ref 0–44)
AST: 34 U/L (ref 15–41)
Albumin: 3.8 g/dL (ref 3.5–5.0)
Alkaline Phosphatase: 81 U/L (ref 38–126)
Anion gap: 8 (ref 5–15)
BUN: 12 mg/dL (ref 8–23)
CO2: 24 mmol/L (ref 22–32)
Calcium: 9 mg/dL (ref 8.9–10.3)
Chloride: 105 mmol/L (ref 98–111)
Creatinine, Ser: 1.17 mg/dL (ref 0.61–1.24)
GFR, Estimated: >60 mL/min (ref >60)
Glucose, Bld: 131 mg/dL — ABNORMAL HIGH (ref 70–99)
Potassium: 4.3 mmol/L (ref 3.5–5.1)
Sodium: 137 mmol/L (ref 135–145)
Total Bilirubin: 0.6 mg/dL (ref 0.3–1.2)
Total Protein: 6.7 g/dL (ref 6.5–8.1)

## 2020-11-08 LAB — CBC WITH DIFFERENTIAL/PLATELET
Abs Immature Granulocytes: 0.02 10*3/uL (ref 0.00–0.07)
Basophils Absolute: 0 10*3/uL (ref 0.0–0.1)
Basophils Relative: 0 %
Eosinophils Absolute: 0.1 10*3/uL (ref 0.0–0.5)
Eosinophils Relative: 2 %
HCT: 42.4 % (ref 39.0–52.0)
Hemoglobin: 14.5 g/dL (ref 13.0–17.0)
Immature Granulocytes: 0 %
Lymphocytes Relative: 15 %
Lymphs Abs: 1 10*3/uL (ref 0.7–4.0)
MCH: 31.9 pg (ref 26.0–34.0)
MCHC: 34.2 g/dL (ref 30.0–36.0)
MCV: 93.4 fL (ref 80.0–100.0)
Monocytes Absolute: 0.6 10*3/uL (ref 0.1–1.0)
Monocytes Relative: 9 %
Neutro Abs: 5 10*3/uL (ref 1.7–7.7)
Neutrophils Relative %: 74 %
Platelets: 188 10*3/uL (ref 150–400)
RBC: 4.54 MIL/uL (ref 4.22–5.81)
RDW: 14.1 % (ref 11.5–15.5)
WBC: 6.8 10*3/uL (ref 4.0–10.5)
nRBC: 0 % (ref 0.0–0.2)

## 2020-11-08 MED ORDER — SODIUM CHLORIDE 0.9 % IV BOLUS
500.0000 mL | Freq: Once | INTRAVENOUS | Status: AC
  Administered 2020-11-08: 500 mL via INTRAVENOUS

## 2020-11-08 NOTE — ED Triage Notes (Signed)
Patient reports Mitral valve repair in May and was seen on 6/24 and diagnosed with DVT and placed on anticoag. Patient reports on Saturday he was walking and started having bilateral leg pains and this morning his legs are cold. Patient ambulatory to room. Patient reports his head feels like a rubber ball being pressurized on his head. Patient reports the pain/pressure in his head is worsening this morning and his PCP is concerned about a bleed on the brain.

## 2020-11-08 NOTE — ED Notes (Signed)
Patient verbalizes understanding of discharge instructions. Opportunity for questioning and answers were provided. Patient discharged from ED.  °

## 2020-11-08 NOTE — ED Provider Notes (Signed)
[facility] EMERGENCY DEPT Provider Note   CSN: [id number] Arrival date & time: 11/08/20  1153     History Chief Complaint  Patient presents with   Headache    Naithen Rivenburg is a 80 y.o. male.  HPI 80 year old male presents with headache.  This morning around 8 AM he had some head pressure, especially when he stood up.  It was pretty severe though now is basically almost gone.  He also was a little dizzy when he stood up.  Had this once before when his blood pressure was elevated to around 180/90 and it was elevated again today.  No vision changes, neck pain or stiffness, vomiting, fever, or focal weakness/numbness.  He was recently put on Eliquis for a DVT and PE and those symptoms seem to be better.  No new pain in his leg.  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  Past Medical History:  Diagnosis Date   Aortic atherosclerosis (HCC)    Bradycardia    while on BB with HR to the 40s   Carotid stenosis    CAROTID DOPPLER, 12/07/2011 - Mild arthrosclerotic changes, no high-grade stenosis   Cataract    rt. eye   Clostridium difficile infection 05/2019   Diverticulitis    Heart murmur    History of echocardiogram    Echo 9/16:  EF 60-65%, no RWMA, Gr 1 DD, holosystolic MVP of posterior leaflet, mild MR, LA upper limits of normal, normal RVF, mod TR, PASP 50 mmHg   Hypercholesteremia    Hyperlipidemia    Hypertension    Hypothyroidism    MVP (mitral valve prolapse)    a. Echo 12/07/2011 - EF-65-70%, severe mitral regurgitation, moderate-severe tricuspid regurgitation, moderate pulmonary HTN, moderate pulmonic regurgitation;  b. 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   Complex valvuloplasty including artificial Gore-tex neochords x10 with 28 mm Medtronic Simuform ring annuloplasty   Testicular hypofunction    Vitamin D deficiency     Patient Active Problem List   Diagnosis Date Noted   Chest pain 10/22/2020   Long term (current) use of anticoagulants 09/30/2020   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 bil 39% 09/25/2013   Occlusion and stenosis of unspecified carotid artery 09/25/2013   Rapid palpitations    Mitral valve prolapse 12/23/2012   Essential hypertension 12/23/2012   Hyperlipidemia 12/23/2012    Past Surgical History:  Procedure Laterality Date   COLONOSCOPY     ETHMOIDECTOMY Left 08/13/2020   Procedure: SPENOIDECTOMY AND ETHMOIDECTOMY;  Surgeon: Drema Halon, MD;  Location: [facility] OR;  Service: ENT;  Laterality: Left;   MITRAL VALVE REPAIR Right 09/22/2020   Procedure: MINIMALLY INVASIVE MITRAL VALVE REPAIR (MVR) USING MEDTRONIC SIMUFORM SEMI-RIGID ANNULOPLASTY RING SIZE ON PUMP;  Surgeon:  Purcell Nailswen, Clarence H, MD;  [facility];  Service: Open Heart Surgery;  Laterality: Right;   RIGHT/LEFT HEART CATH AND CORONARY ANGIOGRAPHY N/A 08/30/2020   Procedure: RIGHT/LEFT HEART CATH AND CORONARY ANGIOGRAPHY;  Surgeon: Runell GessBerry, Jonathan J, MD;  Location: [facility] INVASIVE CV LAB;  Service: Cardiovascular;  Laterality: N/A;   SIGMOIDOSCOPY  02/14/2017   SINUS ENDO WITH FUSION Left 08/13/2020   Procedure: SINUS ENDO WITH FUSION;  Surgeon: Drema HalonNewman, Christopher E, MD;  [facility];  Service: ENT;  Laterality: Left;   TEE WITHOUT CARDIOVERSION N/A 05/04/2020   Procedure: TRANSESOPHAGEAL ECHOCARDIOGRAM (TEE);  Surgeon: Little IshikawaSchumann, Christopher L, MD;  [facility];  Service: Cardiovascular;  Laterality: N/A;   TEE WITHOUT CARDIOVERSION N/A 09/22/2020   Procedure: TRANSESOPHAGEAL ECHOCARDIOGRAM (TEE);  Surgeon: Purcell Nailswen, Clarence H, MD;  [facility];  Service: Open Heart Surgery;  Laterality: N/A;       Family History  Problem Relation Age of Onset   Stroke Mother 1790   Heart failure Father    Diverticulitis Sister    Stroke Brother 6579   Heart attack Brother    Colon cancer Neg Hx    Esophageal cancer Neg Hx    Stomach cancer Neg Hx    Rectal cancer Neg Hx     Social History   Tobacco Use   Smoking status: Former    Pack years: 0.00   Smokeless tobacco: Former    Types: Associate ProfessorChew  Vaping Use   Vaping Use: Never used  Substance Use Topics   Alcohol use: Yes    Alcohol/week: 0.0 standard drinks    Comment: occasionally   Drug use: No    Home Medications Prior to Admission medications   Medication Sig Start Date End Date Taking? Authorizing Provider  acetaminophen (TYLENOL) 500 MG tablet Take 1,000 mg by mouth every 6 (six) hours as needed for mild pain or moderate pain.    [provider]  amiodarone (PACERONE) 200 MG tablet Take 1 tablet (200 mg total) by mouth 2 (two) times daily. X 7 days, then decrease to 200 mg daily 09/29/20   Barrett, Denny PeonErin R, PA-C  APIXABAN (ELIQUIS) VTE STARTER PACK (10MG  AND 5MG ) Take as directed on package: start with two-5mg  tablets twice daily for 7 days. On day 8, switch to one-5mg  tablet twice daily. 10/24/20   Verdia KubaAnwar, Shayan S, DO  Ascorbic Acid (VITAMIN C) 1000 MG tablet Take 1,000 mg by mouth daily.    [provider]  aspirin EC 81 MG EC tablet Take 1 tablet (81 mg total) by mouth daily. Swallow whole. 09/29/20   Barrett, Rae RoamErin R, PA-C  Cholecalciferol (VITAMIN D) 50 MCG (2000 UT) tablet Take 2,000 Units  by mouth daily.    [provider]  diphenhydramine-acetaminophen (TYLENOL PM) 25-500 MG TABS tablet Take 1 tablet by mouth at bedtime as needed (sleep).    [provider]  ezetimibe (ZETIA) 10 MG tablet Take 10 mg by mouth at bedtime.    [provider]  fluticasone (FLONASE) 50 MCG/ACT nasal spray Place 1 spray into both nostrils at bedtime.    [provider]  levothyroxine (SYNTHROID) 75 MCG tablet Take 75 mcg by mouth at bedtime.    [provider]  losartan (COZAAR) 50 MG tablet Take 2 tablets (100 mg total) by mouth daily. 10/25/20 11/24/20  Verdia KubaAnwar, Shayan S, DO  rosuvastatin (CRESTOR) 40 MG tablet Take 40 mg by mouth at bedtime.    [provider]  sodium chloride (  OCEAN) 0.65 % SOLN nasal spray Place 1 spray into both nostrils as needed for congestion.    [provider]    Allergies    Contrast media [iodinated diagnostic agents], Hctz [hydrochlorothiazide], and Metoprolol succinate [metoprolol]  Review of Systems   Review of Systems  Constitutional:  Negative for fever.  Eyes:  Negative for visual disturbance.  Respiratory:  Negative for shortness of breath.   Cardiovascular:  Negative for chest pain.  Gastrointestinal:  Negative for vomiting.  Musculoskeletal:  Negative for neck pain.  Neurological:  Positive for dizziness and headaches. Negative for weakness and numbness.  All other systems reviewed and are negative.  Physical Exam Updated Vital Signs BP 135/81   Pulse 64   Temp 97.8 F (36.6 C)   Resp 16   Ht 5\' 10"  (1.778 m)   Wt 92.5 kg   SpO2 96%   BMI 29.27 kg/m   Physical Exam Vitals and nursing note reviewed.  Constitutional:      Appearance: He is well-developed.  HENT:     Head: Normocephalic and atraumatic.     Right Ear: External ear normal.     Left Ear: External ear normal.     Nose: Nose normal.  Eyes:     General:        Right eye: No discharge.        Left eye: No discharge.      Extraocular Movements: Extraocular movements intact.     Pupils: Pupils are equal, round, and reactive to light.  Cardiovascular:     Rate and Rhythm: Normal rate and regular rhythm.     Pulses:          Dorsalis pedis pulses are detected w/ Doppler on the right side.     Heart sounds: Normal heart sounds.  Pulmonary:     Effort: Pulmonary effort is normal.     Breath sounds: Normal breath sounds.  Abdominal:     Palpations: Abdomen is soft.     Tenderness: There is no abdominal tenderness.  Musculoskeletal:     Cervical back: Normal range of motion and neck supple.  Skin:    General: Skin is warm and dry.  Neurological:     Mental Status: He is alert.     Comments: CN 3-12 grossly intact. 5/5 strength in all 4 extremities. Grossly normal sensation. Normal finger to nose.   Psychiatric:        Mood and Affect: Mood is not anxious.    ED Results / Procedures / Treatments   Labs (all labs ordered are listed, but only abnormal results are displayed) Labs Reviewed  COMPREHENSIVE METABOLIC PANEL - Abnormal; Notable for the following components:      Result Value   Glucose, Bld 131 (*)    ALT 59 (*)    All other components within normal limits  CBC WITH DIFFERENTIAL/PLATELET    EKG None  Radiology CT Head Wo Contrast  Result Date: 11/08/2020 CLINICAL DATA:  Anticoagulation, hemorrhage suspected EXAM: CT HEAD WITHOUT CONTRAST TECHNIQUE: Contiguous axial images were obtained from the base of the skull through the vertex without intravenous contrast. COMPARISON:  10/22/2020 FINDINGS: Brain: No evidence of acute infarction, hemorrhage, hydrocephalus, extra-axial collection or mass lesion/mass effect. Vascular: No hyperdense vessel or unexpected calcification. Skull: Normal. Negative for fracture or focal lesion. Sinuses/Orbits: No acute finding. Other: None. IMPRESSION: No acute intracranial pathology. No evidence of hemorrhage. Electronically Signed   By: 10/24/2020 M.D.   On:  11/08/2020 12:56    Procedures Procedures   Medications Ordered in ED Medications  sodium chloride 0.9 % bolus 500 mL (0 mLs Intravenous Stopped 11/08/20 1418)    ED Course  I have reviewed the triage vital signs and the nursing notes.  Pertinent labs & imaging results that were available during my care of the patient were reviewed by me and considered in my medical decision making (see chart for details).    MDM Rules/Calculators/A&P                          Patient with a nonspecific headache that he associated with an elevated blood pressure of around 170 today.  The headache is basically gone by the time I am seeing him.  However he had a negative head CT within 4 hours or so of onset of symptoms.  Nurse reported that his right leg was cooler than his left but he has a strong pulse on Doppler and to me they have equal warmth.  He has been complaining about cold feet bilaterally since his valve procedure.  Highly doubt acute vascular compromise.  Neuro exam is unremarkable.  No head bleed on the CT and at this point with him feeling well I think he is stable for discharge home.  No fevers.  Highly doubt infection or other acute CNS emergency. Final Clinical Impression(s) / ED Diagnoses Final diagnoses:  Frontal headache    Rx / DC Orders ED Discharge Orders     None        Pricilla Loveless, MD 11/08/20 1557

## 2020-11-08 NOTE — ED Notes (Signed)
Patient reports sensation in right leg duller than left leg, patient also has colder right foot than left foot. Patient able to move and ambulate using leg without difficulty.

## 2020-11-08 NOTE — Discharge Instructions (Addendum)
If you develop continued, recurrent, or worsening headache, fever, neck stiffness, vomiting, blurry or double vision, weakness or numbness in your arms or legs, trouble speaking, or any other new/concerning symptoms then return to the ER for evaluation.  

## 2020-11-10 ENCOUNTER — Emergency Department (HOSPITAL_BASED_OUTPATIENT_CLINIC_OR_DEPARTMENT_OTHER)
Admission: EM | Admit: 2020-11-10 | Discharge: 2020-11-10 | Disposition: A | Discharge status: Home / Self Care | Payer: 59 | Attending: Emergency Medicine | Admitting: Emergency Medicine

## 2020-11-10 ENCOUNTER — Other Ambulatory Visit (HOSPITAL_COMMUNITY): Payer: 59

## 2020-11-10 ENCOUNTER — Emergency Department (HOSPITAL_BASED_OUTPATIENT_CLINIC_OR_DEPARTMENT_OTHER): Payer: 59 | Admitting: Radiology

## 2020-11-10 ENCOUNTER — Emergency Department (HOSPITAL_BASED_OUTPATIENT_CLINIC_OR_DEPARTMENT_OTHER): Payer: 59

## 2020-11-10 ENCOUNTER — Encounter (HOSPITAL_BASED_OUTPATIENT_CLINIC_OR_DEPARTMENT_OTHER): Payer: Self-pay | Admitting: Emergency Medicine

## 2020-11-10 ENCOUNTER — Other Ambulatory Visit: Payer: Self-pay

## 2020-11-10 DIAGNOSIS — R072 Precordial pain: Secondary | ICD-10-CM | POA: Diagnosis present

## 2020-11-10 DIAGNOSIS — E039 Hypothyroidism, unspecified: Secondary | ICD-10-CM | POA: Diagnosis not present

## 2020-11-10 DIAGNOSIS — R600 Localized edema: Secondary | ICD-10-CM | POA: Diagnosis not present

## 2020-11-10 DIAGNOSIS — Z7982 Long term (current) use of aspirin: Secondary | ICD-10-CM | POA: Diagnosis not present

## 2020-11-10 DIAGNOSIS — I10 Essential (primary) hypertension: Secondary | ICD-10-CM | POA: Insufficient documentation

## 2020-11-10 DIAGNOSIS — Z7901 Long term (current) use of anticoagulants: Secondary | ICD-10-CM | POA: Insufficient documentation

## 2020-11-10 DIAGNOSIS — Z79899 Other long term (current) drug therapy: Secondary | ICD-10-CM | POA: Insufficient documentation

## 2020-11-10 DIAGNOSIS — Z87891 Personal history of nicotine dependence: Secondary | ICD-10-CM | POA: Diagnosis not present

## 2020-11-10 DIAGNOSIS — R079 Chest pain, unspecified: Secondary | ICD-10-CM

## 2020-11-10 DIAGNOSIS — R61 Generalized hyperhidrosis: Secondary | ICD-10-CM | POA: Insufficient documentation

## 2020-11-10 LAB — BASIC METABOLIC PANEL
Anion gap: 11 (ref 5–15)
BUN: 12 mg/dL (ref 8–23)
CO2: 23 mmol/L (ref 22–32)
Calcium: 9.7 mg/dL (ref 8.9–10.3)
Chloride: 104 mmol/L (ref 98–111)
Creatinine, Ser: 1.18 mg/dL (ref 0.61–1.24)
GFR, Estimated: >60 mL/min (ref >60)
Glucose, Bld: 106 mg/dL — ABNORMAL HIGH (ref 70–99)
Potassium: 4.2 mmol/L (ref 3.5–5.1)
Sodium: 138 mmol/L (ref 135–145)

## 2020-11-10 LAB — CBC
HCT: 43.2 % (ref 39.0–52.0)
Hemoglobin: 14.7 g/dL (ref 13.0–17.0)
MCH: 31.3 pg (ref 26.0–34.0)
MCHC: 34 g/dL (ref 30.0–36.0)
MCV: 92.1 fL (ref 80.0–100.0)
Platelets: 189 10*3/uL (ref 150–400)
RBC: 4.69 MIL/uL (ref 4.22–5.81)
RDW: 13.9 % (ref 11.5–15.5)
WBC: 5.8 10*3/uL (ref 4.0–10.5)
nRBC: 0 % (ref 0.0–0.2)

## 2020-11-10 LAB — TROPONIN I (HIGH SENSITIVITY)
Troponin I (High Sensitivity): 12 ng/L (ref <18)
Troponin I (High Sensitivity): 12 ng/L (ref <18)

## 2020-11-10 MED ORDER — LIDOCAINE VISCOUS HCL 2 % MT SOLN
15.0000 mL | Freq: Once | OROMUCOSAL | Status: AC
  Administered 2020-11-10: 15 mL via ORAL
  Filled 2020-11-10: qty 15

## 2020-11-10 MED ORDER — ALUM & MAG HYDROXIDE-SIMETH 200-200-20 MG/5ML PO SUSP
30.0000 mL | Freq: Once | ORAL | Status: AC
  Administered 2020-11-10: 30 mL via ORAL
  Filled 2020-11-10: qty 30

## 2020-11-10 NOTE — ED Provider Notes (Signed)
[facility] EMERGENCY DEPT Provider Note   CSN: [id number] Arrival date & time: 11/10/20  4098     History Chief Complaint  Patient presents with   Chest Pain    Chad Berry is a 80 y.o. male.  He has a history of hypertension.  He said he had a minimally invasive mitral valve repair in May.  Diagnosed with PE in June.  Put on Coumadin but ultimately switched to Eliquis.  He has been having some left-sided migratory chest pain since all of this.  Occurred again yesterday for 30 minutes.  Again started at 8 AM today with similar pain.  Left-sided sharp stabbing.  Associated with diaphoresis.  Denies any shortness of breath.  Checked his blood pressure and found it to be elevated and so presented to the emergency department.  The history is provided by the patient.  Chest Pain Pain location:  L chest and substernal area Pain quality: sharp and stabbing   Pain severity:  Moderate Onset quality:  Sudden Duration:  90 minutes Timing:  Intermittent Progression:  Improving Chronicity:  Recurrent Context: at rest   Relieved by:  None tried Worsened by:  Nothing Ineffective treatments:  None tried Associated symptoms: diaphoresis and lower extremity edema   Associated symptoms: no abdominal pain, no back pain, no cough, no dysphagia, no fever, no nausea, no shortness of breath and no vomiting   Risk factors: coronary artery disease, hypertension, male sex and prior DVT/PE    HPI: A 80 year old patient with a history of hypertension and hypercholesterolemia presents for evaluation of chest pain. Initial onset of pain was approximately 1-3 hours ago. The patient's chest pain is sharp and is not worse with exertion. The patient reports some diaphoresis. The patient's chest pain is middle- or left-sided, is not well-localized, is not described as heaviness/pressure/tightness and does not radiate to the arms/jaw/neck. The patient does not complain of nausea. The patient has no  history of stroke, has no history of peripheral artery disease, has not smoked in the past 90 days, denies any history of treated diabetes, has no relevant family history of coronary artery disease (first degree relative at less than age 8) and does not have an elevated BMI (>=30).   Past Medical History:  Diagnosis Date   Aortic atherosclerosis (HCC)    Bradycardia    while on BB with HR to the 40s   Carotid stenosis    CAROTID DOPPLER, 12/07/2011 - Mild arthrosclerotic changes, no high-grade stenosis   Cataract    rt. eye   Clostridium difficile infection 05/2019   Diverticulitis    Heart murmur    History of echocardiogram    Echo 9/16:  EF 60-65%, no RWMA, Gr 1 DD, holosystolic MVP of posterior leaflet, mild MR, LA upper limits of normal, normal RVF, mod TR, PASP 50 mmHg   Hypercholesteremia    Hyperlipidemia    Hypertension    Hypothyroidism    MVP (mitral valve prolapse)    a. Echo 12/07/2011 - EF-65-70%, severe mitral regurgitation, moderate-severe tricuspid regurgitation, moderate pulmonary HTN, moderate pulmonic regurgitation;  b. 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   Complex valvuloplasty including artificial Gore-tex neochords x10 with 28 mm Medtronic Simuform ring annuloplasty   Testicular hypofunction    Vitamin D deficiency     Patient Active Problem List   Diagnosis Date Noted   Chest pain 10/22/2020   Long term (current) use of anticoagulants 09/30/2020   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 bil 39% 09/25/2013   Occlusion and stenosis of unspecified carotid artery 09/25/2013   Rapid palpitations    Mitral valve prolapse 12/23/2012   Essential hypertension 12/23/2012   Hyperlipidemia 12/23/2012    Past Surgical History:  Procedure Laterality Date   COLONOSCOPY     ETHMOIDECTOMY Left 08/13/2020   Procedure: SPENOIDECTOMY AND ETHMOIDECTOMY;  Surgeon: Drema Halon, MD;  Location: [facility] OR;  Service: ENT;  Laterality: Left;   MITRAL VALVE REPAIR Right 09/22/2020   Procedure: MINIMALLY INVASIVE MITRAL VALVE REPAIR (MVR) USING MEDTRONIC SIMUFORM SEMI-RIGID ANNULOPLASTY RING SIZE ON PUMP;  Surgeon: Purcell Nails, MD;  Location: [facility] OR;  Service: Open Heart Surgery;  Laterality: Right;   RIGHT/LEFT HEART CATH AND CORONARY ANGIOGRAPHY N/A 08/30/2020   Procedure: RIGHT/LEFT HEART CATH AND CORONARY ANGIOGRAPHY;  Surgeon: Runell Gess, MD;  Location: [facility] INVASIVE CV LAB;  Service: Cardiovascular;  Laterality: N/A;   SIGMOIDOSCOPY  02/14/2017   SINUS ENDO WITH FUSION Left 08/13/2020   Procedure: SINUS ENDO WITH FUSION;  Surgeon: Drema Halon, MD;  Location: [facility] OR;  Service: ENT;  Laterality: Left;   TEE WITHOUT CARDIOVERSION N/A 05/04/2020   Procedure: TRANSESOPHAGEAL ECHOCARDIOGRAM (TEE);  Surgeon: Little Ishikawa, MD;  Location: [facility] ENDOSCOPY;  Service: Cardiovascular;  Laterality: N/A;   TEE WITHOUT CARDIOVERSION N/A 09/22/2020   Procedure: TRANSESOPHAGEAL ECHOCARDIOGRAM (TEE);  Surgeon: Purcell Nails, MD;  Location: [facility] OR;  Service: Open Heart Surgery;  Laterality: N/A;       Family History  Problem Relation Age of Onset   Stroke Mother 7   Heart failure Father    Diverticulitis  Sister    Stroke Brother 30   Heart attack Brother    Colon cancer Neg Hx    Esophageal cancer Neg Hx    Stomach cancer Neg Hx    Rectal cancer Neg Hx     Social History   Tobacco Use   Smoking status: Former    Pack years: 0.00   Smokeless tobacco: Former    Types: Associate Professor Use: Never used  Substance Use Topics   Alcohol use: Yes    Alcohol/week: 0.0 standard drinks    Comment: occasionally   Drug use: No    Home Medications Prior to Admission medications   Medication Sig Start Date End Date Taking? Authorizing Provider  acetaminophen (TYLENOL) 500 MG tablet Take 1,000 mg by mouth every 6 (six) hours as needed for mild pain or moderate pain.    [provider]  amiodarone (PACERONE) 200 MG tablet Take 1 tablet (200 mg total) by mouth 2 (two) times daily. X 7 days, then decrease to 200 mg daily 09/29/20   Barrett, Erin R, PA-C  APIXABAN (ELIQUIS) VTE STARTER PACK (10MG  AND 5MG ) Take  as directed on package: start with two-5mg  tablets twice daily for 7 days. On day 8, switch to one-5mg  tablet twice daily. 10/24/20   Verdia Kuba, DO  Ascorbic Acid (VITAMIN C) 1000 MG tablet Take 1,000 mg by mouth daily.    [provider]  aspirin EC 81 MG EC tablet Take 1 tablet (81 mg total) by mouth daily. Swallow whole. 09/29/20   Barrett, Rae Roam, PA-C  Cholecalciferol (VITAMIN D) 50 MCG (2000 UT) tablet Take 2,000 Units by mouth daily.    [provider]  diphenhydramine-acetaminophen (TYLENOL PM) 25-500 MG TABS tablet Take 1 tablet by mouth at bedtime as needed (sleep).    [provider]  ezetimibe (ZETIA) 10 MG tablet Take 10 mg by mouth at bedtime.    [provider]  fluticasone (FLONASE) 50 MCG/ACT nasal spray Place 1 spray into both nostrils at bedtime.    [provider]  levothyroxine (SYNTHROID) 75 MCG tablet Take 75 mcg by mouth at bedtime.    [provider]  losartan (COZAAR) 50 MG tablet Take 2 tablets  (100 mg total) by mouth daily. 10/25/20 11/24/20  Verdia Kuba, DO  rosuvastatin (CRESTOR) 40 MG tablet Take 40 mg by mouth at bedtime.    [provider]  sodium chloride (OCEAN) 0.65 % SOLN nasal spray Place 1 spray into both nostrils as needed for congestion.    [provider]    Allergies    Contrast media [iodinated diagnostic agents], Hctz [hydrochlorothiazide], and Metoprolol succinate [metoprolol]  Review of Systems   Review of Systems  Constitutional:  Positive for diaphoresis. Negative for fever.  HENT:  Negative for sore throat and trouble swallowing.   Eyes:  Negative for visual disturbance.  Respiratory:  Negative for cough and shortness of breath.   Cardiovascular:  Positive for chest pain.  Gastrointestinal:  Negative for abdominal pain, nausea and vomiting.  Genitourinary:  Negative for dysuria.  Musculoskeletal:  Negative for back pain.  Skin:  Negative for rash.  Neurological:  Negative for syncope.   Physical Exam Updated Vital Signs BP (!) 171/88 (BP Location: Right Arm)   Pulse 78   Temp 97.8 F (36.6 C) (Oral)   Resp (!) 21   SpO2 99%   Physical Exam Vitals and nursing note reviewed.  Constitutional:      Appearance: He is well-developed.  HENT:     Head: Normocephalic and atraumatic.  Eyes:     Conjunctiva/sclera: Conjunctivae normal.  Cardiovascular:     Rate and Rhythm: Normal rate and regular rhythm.     Heart sounds: No murmur heard. Pulmonary:     Effort: Pulmonary effort is normal. No respiratory distress.     Breath sounds: Normal breath sounds.  Abdominal:     Palpations: Abdomen is soft.     Tenderness: There is no abdominal tenderness.  Musculoskeletal:        General: Normal range of motion.     Cervical back: Neck supple.     Right lower leg: No tenderness. Edema present.     Left lower leg: No tenderness. Edema present.  Skin:    General: Skin is warm and dry.  Neurological:     General: No focal deficit  present.     Mental Status: He is alert.    ED Results / Procedures / Treatments   Labs (all labs ordered are listed, but only abnormal results are displayed) Labs Reviewed  BASIC METABOLIC PANEL - Abnormal; Notable for  the following components:      Result Value   Glucose, Bld 106 (*)    All other components within normal limits  CBC  TROPONIN I (HIGH SENSITIVITY)  TROPONIN I (HIGH SENSITIVITY)    EKG EKG Interpretation  Date/Time:  Wednesday November 10 2020 09:20:14 EDT Ventricular Rate:  79 PR Interval:  207 QRS Duration: 144 QT Interval:  421 QTC Calculation: 483 R Axis:   28 Text Interpretation: Sinus rhythm Ventricular premature complex Left bundle branch block No significant change since prior 2 days ago Confirmed by Meridee ScoreButler, Ysabela Keisler [phone]([id number]) on 11/10/2020 9:22:09 AM  Radiology CT Head Wo Contrast  Result Date: 11/08/2020 CLINICAL DATA:  Anticoagulation, hemorrhage suspected EXAM: CT HEAD WITHOUT CONTRAST TECHNIQUE: Contiguous axial images were obtained from the base of the skull through the vertex without intravenous contrast. COMPARISON:  10/22/2020 FINDINGS: Brain: No evidence of acute infarction, hemorrhage, hydrocephalus, extra-axial collection or mass lesion/mass effect. Vascular: No hyperdense vessel or unexpected calcification. Skull: Normal. Negative for fracture or focal lesion. Sinuses/Orbits: No acute finding. Other: None. IMPRESSION: No acute intracranial pathology. No evidence of hemorrhage. Electronically Signed   By: Lauralyn PrimesAlex  Bibbey M.D.   On: 11/08/2020 12:56    Procedures Procedures   Medications Ordered in ED Medications  alum & mag hydroxide-simeth (MAALOX/MYLANTA) 200-200-20 MG/5ML suspension 30 mL (30 mLs Oral Given 11/10/20 1200)    And  lidocaine (XYLOCAINE) 2 % viscous mouth solution 15 mL (15 mLs Oral Given 11/10/20 1200)    ED Course  I have reviewed the triage vital signs and the nursing notes.  Pertinent labs & imaging results that were  available during my care of the patient were reviewed by me and considered in my medical decision making (see chart for details).  Clinical Course as of 11/10/20 1836  Wed Nov 10, 2020  [id number] Chest x-ray interpreted by me as some scarring in the right lung.  No gross infiltrates. [MB]  1040 Discussed with [facility] cardiologist Dr. Herbie BaltimoreHarding.  We reviewed his recent cardiac cath and prior cardiac evaluations.  He feels that this is unlikely ACS.  He will contact the office to see if he can get him a sooner clinic appointment and a sooner echo than he has scheduled. [MB]  1246 Patient had a GI cocktail and says it relieved his chest discomfort.  His delta troponins are unchanged.  He is comfortable plan for outpatient follow-up with his treating providers.  Return instructions discussed [MB]    Clinical Course User Index [MB] Terrilee FilesButler, Kieryn Burtis C, MD   MDM Rules/Calculators/A&P HEAR Score: 5                       This patient complains of substernal left-sided chest pain with diaphoresis; this involves an extensive number of treatment Options and is a complaint that carries with it a high risk of complications and Morbidity. The differential includes ACS, pneumonia, pneumothorax, PE, vascular, musculoskeletal, reflux  I ordered, reviewed and interpreted labs, which included CBC with normal white count normal hemoglobin, chemistries fairly normal, delta troponins flat I ordered medication GI cocktail with improvement in his symptoms I ordered imaging studies which included chest x-ray and I independently    visualized and interpreted imaging which showed postoperative changes right chest Additional history obtained from patient's wife Previous records obtained and reviewed in epic including prior operative notes and recent catheterization I consulted Dr. Herbie BaltimoreHarding cardiology and discussed lab and imaging findings  Critical Interventions: None  After the  interventions stated above, I reevaluated the  patient and found patient to be currently pain-free.  Discussed work-up with him and cardiology recommendations.  Recommended close follow-up with cardiology as directed by cardiology.  Return instructions discussed.   Final Clinical Impression(s) / ED Diagnoses Final diagnoses:  Nonspecific chest pain    Rx / DC Orders ED Discharge Orders     None        Terrilee Files, MD 11/10/20 1839

## 2020-11-10 NOTE — Discharge Instructions (Addendum)
You were seen in the emergency department for some chest pain.  You had an EKG chest x-ray and blood counts that did not show an obvious explanation for your symptoms.  There was no evidence of heart injury.  Your symptoms improved with some Maalox so it may be related to some reflux.  You can continue this at home.  Cardiology should be reaching out to you for follow-up appointment.  Return if any worsening or concerning symptoms

## 2020-11-10 NOTE — ED Triage Notes (Signed)
Pt arrives to ED with c/o precordial chest pain that awoke him from sleep this morning. Pain lasted an hour, relieved with rest. Pain is "dull" and "pressure-like." Pt reports the pain radiates down his left side. Pt reports chest pressure last night that lasted 30 minutes that felt similar. Recently dx with PE, on Eliquis.

## 2020-11-11 ENCOUNTER — Emergency Department (HOSPITAL_BASED_OUTPATIENT_CLINIC_OR_DEPARTMENT_OTHER)
Admission: EM | Admit: 2020-11-11 | Discharge: 2020-11-11 | Disposition: A | Discharge status: Home / Self Care | Payer: 59 | Attending: Emergency Medicine | Admitting: Emergency Medicine

## 2020-11-11 ENCOUNTER — Other Ambulatory Visit: Payer: Self-pay

## 2020-11-11 ENCOUNTER — Encounter (HOSPITAL_BASED_OUTPATIENT_CLINIC_OR_DEPARTMENT_OTHER): Payer: Self-pay | Admitting: *Deleted

## 2020-11-11 ENCOUNTER — Emergency Department (HOSPITAL_BASED_OUTPATIENT_CLINIC_OR_DEPARTMENT_OTHER): Payer: 59

## 2020-11-11 ENCOUNTER — Telehealth: Payer: Self-pay | Admitting: Cardiovascular Disease

## 2020-11-11 DIAGNOSIS — S0003XA Contusion of scalp, initial encounter: Secondary | ICD-10-CM | POA: Insufficient documentation

## 2020-11-11 DIAGNOSIS — Z79899 Other long term (current) drug therapy: Secondary | ICD-10-CM | POA: Diagnosis not present

## 2020-11-11 DIAGNOSIS — Z87891 Personal history of nicotine dependence: Secondary | ICD-10-CM | POA: Diagnosis not present

## 2020-11-11 DIAGNOSIS — Z7901 Long term (current) use of anticoagulants: Secondary | ICD-10-CM | POA: Diagnosis not present

## 2020-11-11 DIAGNOSIS — E039 Hypothyroidism, unspecified: Secondary | ICD-10-CM | POA: Diagnosis not present

## 2020-11-11 DIAGNOSIS — Z955 Presence of coronary angioplasty implant and graft: Secondary | ICD-10-CM | POA: Insufficient documentation

## 2020-11-11 DIAGNOSIS — W228XXA Striking against or struck by other objects, initial encounter: Secondary | ICD-10-CM | POA: Diagnosis not present

## 2020-11-11 DIAGNOSIS — S0990XA Unspecified injury of head, initial encounter: Secondary | ICD-10-CM

## 2020-11-11 DIAGNOSIS — I10 Essential (primary) hypertension: Secondary | ICD-10-CM | POA: Insufficient documentation

## 2020-11-11 NOTE — Telephone Encounter (Signed)
Called patient, he was already at the ED. Advised patient to have them monitor and let us know of any issues. Patient thankful for call back.

## 2020-11-11 NOTE — Telephone Encounter (Signed)
Chad Berry is calling stating he hit his head today and he feels a touch of a bump from hitting it and they advised him to go to the ED due to him being on blood thinners. He is wanting to know if he could come in to have it looked at today to avoid another trip to the hospital. He states he barley hit it and is close to the office. Please advise.

## 2020-11-11 NOTE — ED Notes (Signed)
Patient transported to CT 

## 2020-11-11 NOTE — Discharge Instructions (Addendum)
Your CT today was reassuring .  Follow up with your primary care doctor.   Return to the emergency department for any vomiting, difficulty moving your arms or legs or any other worsening concerning symptoms.

## 2020-11-11 NOTE — ED Triage Notes (Signed)
Pt c/o head head on corner of wall x 2 hrs ago , pt is on Eliquis , denies LOC

## 2020-11-11 NOTE — ED Provider Notes (Signed)
[facility] EMERGENCY DEPARTMENT Provider Note   CSN: [id number] Arrival date & time: 11/11/20  1427     History Chief Complaint  Patient presents with   Head Injury    Chad Berry is a 80 y.o. male past ministry of aortic atherosclerosis, mitral valve replacement who is currently on Eliquis who presents for evaluation of head injury.  Patient reports about 2 hours prior to ED arrival, he hit his head on the corner of a wall.  He reports that he was told to come to the emergency department for evaluation.  He did not lose consciousness.  No nausea/vomiting.  No numbness/weakness.  He denies any other injury.  The history is provided by the patient.      Past Medical History:  Diagnosis Date   Aortic atherosclerosis (HCC)    Bradycardia    while on BB with HR to the 40s   Carotid stenosis    CAROTID DOPPLER, 12/07/2011 - Mild arthrosclerotic changes, no high-grade stenosis   Cataract    rt. eye   Clostridium difficile infection 05/2019   Diverticulitis    Heart murmur    History of echocardiogram    Echo 9/16:  EF 60-65%, no RWMA, Gr 1 DD, holosystolic MVP of posterior leaflet, mild MR, LA upper limits of normal, normal RVF, mod TR, PASP 50 mmHg   Hypercholesteremia    Hyperlipidemia    Hypertension    Hypothyroidism    MVP (mitral valve prolapse)    a. Echo 12/07/2011 - EF-65-70%, severe mitral regurgitation, moderate-severe tricuspid regurgitation, moderate pulmonary HTN, moderate pulmonic regurgitation;  b. 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   Complex  valvuloplasty including artificial Gore-tex neochords x10 with 28 mm Medtronic Simuform ring annuloplasty   Testicular hypofunction    Vitamin D deficiency     Patient Active Problem List   Diagnosis Date Noted   Chest pain 10/22/2020   Long term (current) use of anticoagulants 09/30/2020   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 bil 39% 09/25/2013   Occlusion and stenosis of unspecified carotid artery 09/25/2013   Rapid palpitations    Mitral valve prolapse 12/23/2012   Essential hypertension 12/23/2012   Hyperlipidemia 12/23/2012    Past Surgical History:  Procedure Laterality Date   COLONOSCOPY     ETHMOIDECTOMY Left 08/13/2020   Procedure: SPENOIDECTOMY AND ETHMOIDECTOMY;  Surgeon: Drema Halon, MD;  Location: [facility] OR;  Service: ENT;  Laterality: Left;   MITRAL VALVE REPAIR Right 09/22/2020   Procedure: MINIMALLY INVASIVE MITRAL VALVE REPAIR (MVR) USING MEDTRONIC SIMUFORM SEMI-RIGID ANNULOPLASTY RING SIZE ON PUMP;  Surgeon: Purcell Nails, MD;  Location: [facility] OR;  Service: Open Heart Surgery;  Laterality: Right;   RIGHT/LEFT HEART CATH AND CORONARY ANGIOGRAPHY N/A 08/30/2020   Procedure: RIGHT/LEFT  HEART CATH AND CORONARY ANGIOGRAPHY;  Surgeon: Runell Gess, MD;  Location: [facility] INVASIVE CV LAB;  Service: Cardiovascular;  Laterality: N/A;   SIGMOIDOSCOPY  02/14/2017   SINUS ENDO WITH FUSION Left 08/13/2020   Procedure: SINUS ENDO WITH FUSION;  Surgeon: Drema Halon, MD;  Location: [facility] OR;  Service: ENT;  Laterality: Left;   TEE WITHOUT CARDIOVERSION N/A 05/04/2020   Procedure: TRANSESOPHAGEAL ECHOCARDIOGRAM (TEE);  Surgeon:  Little Ishikawa, MD;  Location: [facility] ENDOSCOPY;  Service: Cardiovascular;  Laterality: N/A;   TEE WITHOUT CARDIOVERSION N/A 09/22/2020   Procedure: TRANSESOPHAGEAL ECHOCARDIOGRAM (TEE);  Surgeon: Purcell Nails, MD;  Location: [facility] OR;  Service: Open Heart Surgery;  Laterality: N/A;       Family History  Problem Relation Age of Onset   Stroke Mother 52   Heart failure Father    Diverticulitis Sister    Stroke Brother 28   Heart attack Brother    Colon cancer Neg Hx    Esophageal cancer Neg Hx    Stomach cancer Neg Hx    Rectal cancer Neg Hx     Social History   Tobacco Use   Smoking status: Former   Smokeless tobacco: Former    Types: Associate Professor Use: Never used  Substance Use Topics   Alcohol use: Yes    Alcohol/week: 0.0 standard drinks    Comment: occasionally   Drug use: No    Home Medications Prior to Admission medications   Medication Sig Start Date End Date Taking? Authorizing Provider  acetaminophen (TYLENOL) 500 MG tablet Take 1,000 mg by mouth every 6 (six) hours as needed for mild pain or moderate pain.    [provider]  amiodarone (PACERONE) 200 MG tablet Take 1 tablet (200 mg total) by mouth 2 (two) times daily. X 7 days, then decrease to 200 mg daily 09/29/20   Barrett, Denny Peon R, PA-C  APIXABAN (ELIQUIS) VTE STARTER PACK (10MG  AND 5MG ) Take as directed on package: start with two-5mg  tablets twice daily for 7 days. On day 8, switch to one-5mg  tablet twice daily. 10/24/20   , DO  Ascorbic Acid (VITAMIN C) 1000 MG tablet Take 1,000 mg by mouth daily.    [provider]  aspirin EC 81 MG EC tablet Take 1 tablet (81 mg total) by mouth daily. Swallow whole. 09/29/20   Barrett, Verdia Kuba, PA-C  Cholecalciferol (VITAMIN D) 50 MCG (2000 UT) tablet Take 2,000 Units by mouth daily.    [provider]  diphenhydramine-acetaminophen (TYLENOL PM) 25-500 MG TABS tablet Take 1 tablet by mouth at bedtime as needed (sleep).     [provider]  ezetimibe (ZETIA) 10 MG tablet Take 10 mg by mouth at bedtime.    [provider]  fluticasone (FLONASE) 50 MCG/ACT nasal spray Place 1 spray into both nostrils at bedtime.    [provider]  levothyroxine (SYNTHROID) 75 MCG tablet Take 75 mcg by mouth at bedtime.    [provider]  losartan (COZAAR) 50 MG tablet Take 2 tablets (100 mg total) by mouth daily. 10/25/20 11/24/20  10/27/20, DO  rosuvastatin (CRESTOR) 40 MG tablet Take 40 mg by mouth at bedtime.    [provider]  vitamin B-12 (CYANOCOBALAMIN) 100 MCG tablet Take 100 mcg by mouth daily.    [provider]    Allergies    Contrast media [iodinated diagnostic agents], Hctz [hydrochlorothiazide], and Metoprolol succinate [metoprolol]  Review  of Systems   Review of Systems  Eyes:  Negative for visual disturbance.  Gastrointestinal:  Negative for nausea and vomiting.  Skin:  Positive for wound.  Neurological:  Negative for weakness and numbness.  All other systems reviewed and are negative.  Physical Exam Updated Vital Signs BP 132/84   Pulse 67   Temp 97.7 F (36.5 C) (Oral)   Resp 16   Ht 5\' 10"  (1.778 m)   Wt 88.5 kg   SpO2 97%   BMI 27.98 kg/m   Physical Exam Vitals and nursing note reviewed.  Constitutional:      Appearance: He is well-developed.  HENT:     Head: Normocephalic and atraumatic.     Comments: Small hematoma noted to the right posterior aspect of the scalp.  No underlying skull deformity or crepitus noted. Eyes:     General: No scleral icterus.       Right eye: No discharge.        Left eye: No discharge.     Conjunctiva/sclera: Conjunctivae normal.  Pulmonary:     Effort: Pulmonary effort is normal.  Skin:    General: Skin is warm and dry.  Neurological:     Mental Status: He is alert.     Comments: Cranial nerves III-XII intact Follows commands, Moves all extremities  5/5 strength to BUE and BLE  Sensation  intact throughout all major nerve distributions No slurred speech. No facial droop.   Psychiatric:        Speech: Speech normal.        Behavior: Behavior normal.    ED Results / Procedures / Treatments   Labs (all labs ordered are listed, but only abnormal results are displayed) Labs Reviewed - No data to display  EKG None  Radiology CT Head Wo Contrast  Result Date: 11/11/2020 CLINICAL DATA:  Hit head.  On blood thinners. EXAM: CT HEAD WITHOUT CONTRAST TECHNIQUE: Contiguous axial images were obtained from the base of the skull through the vertex without intravenous contrast. COMPARISON:  11/08/2020 FINDINGS: Brain: No acute intracranial abnormality. Specifically, no hemorrhage, hydrocephalus, mass lesion, acute infarction, or significant intracranial injury. Vascular: No hyperdense vessel or unexpected calcification. Skull: No acute calvarial abnormality. Sinuses/Orbits: Visualized paranasal sinuses and mastoids clear. Orbital soft tissues unremarkable. Other: None IMPRESSION: No acute intracranial abnormality. Electronically Signed   By: 01/09/2021 M.D.   On: 11/11/2020 15:43   DG Chest Port 1 View  Result Date: 11/10/2020 CLINICAL DATA:  Chest pain EXAM: PORTABLE CHEST 1 VIEW COMPARISON:  11/02/2020 FINDINGS: Stable scarring in the right mid chest with right lung volume loss. No focal consolidation. No pleural effusion or pneumothorax. Heart and mediastinal contours are unremarkable. Prior valve replacement. No acute osseous abnormality. IMPRESSION: No acute cardiopulmonary disease. Electronically Signed   By: 01/03/2021   On: 11/10/2020 10:10    Procedures Procedures   Medications Ordered in ED Medications - No data to display  ED Course  I have reviewed the triage vital signs and the nursing notes.  Pertinent labs & imaging results that were available during my care of the patient were reviewed by me and considered in my medical decision making (see chart for details).     MDM Rules/Calculators/A&P                          80 year old male who presents for evaluation of head injury that occurred 2 hours prior to ED arrival.  Reports  hitting it on the corner.  He is on Eliquis.  No LOC.  On initial arrival, he is afebrile, toxic appearing.  Vital signs are stable.  On exam, he does have a small hematoma noted to the right posterior scalp.  No underlying skull deformity or crepitus noted.  No wound, laceration.  Normal neuro exam.  We discussed the risk versus benefits of obtaining imaging.  Given that he is on Eliquis, he does have more concern of intracranial injury Patient is agreeable.  Will obtain imaging.  CT head negative for any acute abnormalities.   Discussed results with patient.  Encouraged home supportive care measures. At this time, patient exhibits no emergent life-threatening condition that require further evaluation in ED. Patient had ample opportunity for questions and discussion. All patient's questions were answered with full understanding. Strict return precautions discussed. Patient expresses understanding and agreement to plan.    Portions of this note were generated with Scientist, clinical (histocompatibility and immunogenetics)Dragon dictation software. Dictation errors may occur despite best attempts at proofreading.   Final Clinical Impression(s) / ED Diagnoses Final diagnoses:  Minor head injury, initial encounter    Rx / DC Orders ED Discharge Orders     None        Rosana HoesLayden, Prescious Hurless A, PA-C 11/11/20 1651    Arby BarrettePfeiffer, Marcy, MD 11/26/20 1925

## 2020-11-15 ENCOUNTER — Ambulatory Visit: Payer: 59

## 2020-11-16 ENCOUNTER — Encounter: Payer: Self-pay | Admitting: Cardiovascular Disease

## 2020-11-16 ENCOUNTER — Ambulatory Visit: Payer: 59

## 2020-11-16 ENCOUNTER — Ambulatory Visit (INDEPENDENT_AMBULATORY_CARE_PROVIDER_SITE_OTHER): Payer: 59 | Admitting: Cardiovascular Disease

## 2020-11-16 ENCOUNTER — Other Ambulatory Visit: Payer: Self-pay

## 2020-11-16 VITALS — BP 140/80 | HR 70 | Ht 70.0 in | Wt 204.0 lb

## 2020-11-16 DIAGNOSIS — I480 Paroxysmal atrial fibrillation: Secondary | ICD-10-CM | POA: Diagnosis not present

## 2020-11-16 DIAGNOSIS — I82401 Acute embolism and thrombosis of unspecified deep veins of right lower extremity: Secondary | ICD-10-CM | POA: Diagnosis not present

## 2020-11-16 DIAGNOSIS — I82409 Acute embolism and thrombosis of unspecified deep veins of unspecified lower extremity: Secondary | ICD-10-CM | POA: Insufficient documentation

## 2020-11-16 DIAGNOSIS — Z9889 Other specified postprocedural states: Secondary | ICD-10-CM | POA: Diagnosis not present

## 2020-11-16 DIAGNOSIS — I82411 Acute embolism and thrombosis of right femoral vein: Secondary | ICD-10-CM

## 2020-11-16 DIAGNOSIS — I10 Essential (primary) hypertension: Secondary | ICD-10-CM | POA: Diagnosis not present

## 2020-11-16 NOTE — Assessment & Plan Note (Signed)
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  He did have chest pain and had a VQ scan performed 11/03/2020 which was positive for pulmonary embolism.

## 2020-11-16 NOTE — Progress Notes (Signed)
11/16/2020 Chad Berry   [birth year]  [id number]  Primary Physician Mosetta Putt, MD Primary Cardiologist: Runell Gess MD Chad Berry  HPI:  Chad Berry is a 80 y.o.  moderately overweight, married Caucasian male, father of 2, grandfather to 2 grandchildren whose son [name] is also a patient of mine, as is his wife. I last saw him 08/25/2020. He was worked up for palpitations and had an echo that showed severe MR with mild mitral valve prolapse and mild LV size and function. His other problems include hypertension and hyperlipidemia. He lives an active lifestyle and is actually heading out to [place] to go coyote hunting for a week. He has changed his diet and has lost 15 pounds. He has cut out caffeine and alcohol. He has had 2 negative sleep studies. He is otherwise asymptomatic. Since I saw him several years ago he has seen Tereso Newcomer in the office for evaluation of palpitations. A 2-D echo revealed normal LV function with holosystolic mitral valve prolapse, mild MR and moderate TR. A 48 hour Holter monitor showed PVCs, short runs of nonsustained ventricular tachycardia and short runs of SVT. Since stopping his diuretic his palpitations have improved.   I did refer him to Dr. Ladona Ridgel who recommended conservative therapy noting he had PACs and PVCs.  He recommended avoidance of caffeine and alcohol.  He is referred back by Dr. Duaine Dredge for follow-up of his mitral regurgitation because of a murmur which he thought was louder than he had remembered.  Repeat 2D echo performed 11/14/2017 revealed moderate to severe MR with posterior leaflet prolapse, normal LV size and function.  He is completely asymptomatic.  He purchased his ZR1 Corvette and recently rode [place] 150 mph.   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    He does complain of increasing dyspnea over the last  6 to 12 months.  He had a transesophageal echo performed by Dr. Bjorn Pippin 05/04/2020 revealing a flail P2 segment with severe MR.  He wishes to proceed with mini mitral repair with Dr. Cornelius Moras.  I performed right and left heart cath revealing normal coronary arteries and severe MR.  He underwent mitral valve repair minimally invasively by Dr. Cornelius Moras 09/22/2020 and was discharged several days later.  He was readmitted 10/22/2020 with chest pain was found to have a DVT on the right with a pulmonary embolus.  His Coumadin was subtherapeutic which she was originally begun on because of perioperative PAF and he was transitioned to Eliquis oral anticoagulation.  He is currently participating cardiac rehab.     Current Meds  Medication Sig   acetaminophen (TYLENOL) 500 MG tablet Take 1,000 mg by mouth every 6 (six) hours as needed for mild pain or moderate pain.   amiodarone (PACERONE) 200 MG tablet Take 1 tablet (200 mg total) by mouth 2 (two) times daily. X 7 days, then decrease to 200 mg daily   APIXABAN (ELIQUIS) VTE STARTER PACK (10MG  AND 5MG ) Take as directed on package: start with two-5mg  tablets twice daily for 7 days. On day 8, switch to one-5mg  tablet twice daily.   Ascorbic Acid (VITAMIN C) 1000 MG tablet Take 1,000 mg by mouth daily.   aspirin EC 81 MG EC tablet Take 1 tablet (81 mg total) by mouth daily. Swallow whole.   Cholecalciferol (VITAMIN D) 50 MCG (2000 UT)  tablet Take 2,000 Units by mouth daily.   diphenhydramine-acetaminophen (TYLENOL PM) 25-500 MG TABS tablet Take 1 tablet by mouth at bedtime as needed (sleep).   ezetimibe (ZETIA) 10 MG tablet Take 10 mg by mouth at bedtime.   fluticasone (FLONASE) 50 MCG/ACT nasal spray Place 1 spray into both nostrils at bedtime.   levothyroxine (SYNTHROID) 75 MCG tablet Take 75 mcg by mouth at bedtime.   losartan (COZAAR) 50 MG tablet Take 2 tablets (100 mg total) by mouth daily.   rosuvastatin (CRESTOR) 40 MG tablet Take 40 mg by mouth at bedtime.    vitamin B-12 (CYANOCOBALAMIN) 100 MCG tablet Take 100 mcg by mouth daily.     Allergies  Allergen Reactions   Contrast Media [Iodinated Diagnostic Agents] Hives, Nausea And Vomiting and Cough    Pt will need a 13 hour prep before any contrast per Dr. Paulina Fusi (05/14/19)   Hctz [Hydrochlorothiazide] Other (See Comments)    palpitations   Metoprolol Succinate [Metoprolol] Other (See Comments)    Bradycardia      Social History   Socioeconomic History   Marital status: Married    Spouse name: Not on file   Number of children: Not on file   Years of education: Not on file   Highest education level: Not on file  Occupational History   Not on file  Tobacco Use   Smoking status: Former   Smokeless tobacco: Former    Types: Associate Professor Use: Never used  Substance and Sexual Activity   Alcohol use: Yes    Alcohol/week: 0.0 standard drinks    Comment: occasionally   Drug use: No   Sexual activity: Not on file  Other Topics Concern   Not on file  Social History Narrative   Married, owner of Facilities manager company   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  Cardiovascular: negative for chest pain, dyspnea on exertion, edema, orthopnea, palpitations, paroxysmal nocturnal dyspnea or shortness of breath Dermatological: negative for rash Respiratory: negative for cough or wheezing Urologic: negative for hematuria Abdominal: negative for nausea, vomiting, diarrhea, bright red blood per rectum, melena, or hematemesis Neurologic: negative for visual changes, syncope,  or dizziness All other systems reviewed and are otherwise negative except as noted above.    Blood pressure 140/80, pulse 70, height 5\' 10"  (1.778 m), weight 204 lb (92.5 kg).  General appearance: alert and no distress Neck: no adenopathy, no carotid bruit, no JVD, supple, symmetrical, trachea midline, and thyroid not enlarged, symmetric, no tenderness/mass/nodules Lungs: clear to auscultation bilaterally Heart: regular rate and rhythm, S1, S2 normal, no murmur, click, rub or gallop Extremities: extremities normal, atraumatic, no cyanosis or edema Pulses: 2+ and symmetric Skin: Skin color, texture, turgor normal. No rashes or lesions Neurologic: Grossly normal  EKG not performed today  ASSESSMENT AND PLAN:   Essential hypertension History of essential hypertension a blood pressure measured today 140/80.  He is on losartan.  Hyperlipidemia History of hyperlipidemia on statin therapy followed by his PCP  S/P minimally invasive  mitral valve repair History of severe mitral regurgitation with cardiac cath performed by myself revealing normal coronary arteries.  He underwent minimally invasive mitral valve repair by Dr. Cornelius Moras 09/22/2020 with complex valvuloplasty including PTFE Neochord  placement and a Medtronic Simuform  ring annuloplasty.  He is participating cardiac rehab and is doing well without symptoms.  We will recheck a 2D echocardiogram.  Paroxysmal atrial fibrillation (HCC) History of postop PAF originally on Coumadin anticoagulation ultimately switched to Eliquis maintaining sinus rhythm on amiodarone.  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  He did have chest pain and had a VQ scan performed 11/03/2020 which was positive for pulmonary embolism.     Runell Gess MD FACP,FACC,FAHA, [facility] 11/16/2020 11:32 AM

## 2020-11-16 NOTE — Progress Notes (Unsigned)
Patient enrolled for [employer] to mail a 14 day ZIO XT monitor to address on file. 

## 2020-11-16 NOTE — Patient Instructions (Addendum)
Medication Instructions:  The current medical regimen is effective;  continue present plan and medications.  *If you need a refill on your cardiac medications before your next appointment, please call your pharmacy*   Testing/Procedures: Echocardiogram - Your physician has requested that you have an echocardiogram. Echocardiography is a painless test that uses sound waves to create images of your heart. It provides your doctor with information about the size and shape of your heart and how well your heart's chambers and valves are working. This procedure takes approximately one hour. There are no restrictions for this procedure. This will be performed at our [facility] location - [address], [address].  Your physician has requested that you have a lower extremity venous duplex (in 3 months before follow up). This test is an ultrasound of the veins in the legs. It looks at venous blood flow that carries blood from the heart to the legs. Allow one hour for a Lower Venous exam. There are no restrictions or special instructions.  ZIO XT- Long Term Monitor Instructions  Your physician has requested you wear a ZIO patch monitor for 14 days.  This is a single patch monitor. [employer] supplies one patch monitor per enrollment. Additional stickers are not available. Please do not apply patch if you will be having a Nuclear Stress Test,  Echocardiogram, Cardiac CT, MRI, or Chest Xray during the period you would be wearing the  monitor. The patch cannot be worn during these tests. You cannot remove and re-apply the  ZIO XT patch monitor.  Your ZIO patch monitor will be mailed 3 day USPS to your address on file. It may take 3-5 days  to receive your monitor after you have been enrolled.  Once you have received your monitor, please review the enclosed instructions. Your monitor  has already been registered assigning a specific monitor serial # to you.  Billing and Patient Assistance Program  Information  We have supplied [employer] with any of your insurance information on file for billing purposes. [employer] offers a sliding scale Patient Assistance Program for patients that do not have  insurance, or whose insurance does not completely cover the cost of the ZIO monitor.  You must apply for the Patient Assistance Program to qualify for this discounted rate.  To apply, please call [employer] at [phone], select option 4, select option 2, ask to apply for  Patient Assistance Program. Meredeth Ide will ask your household income, and how many people  are in your household. They will quote your out-of-pocket cost based on that information.  [employer] will also be able to set up a 26-month, interest-free payment plan if needed.  Applying the monitor   Shave hair from upper left chest.  Hold abrader disc by orange tab. Rub abrader in 40 strokes over the upper left chest as  indicated in your monitor instructions.  Clean area with 4 enclosed alcohol pads. Let dry.  Apply patch as indicated in monitor instructions. Patch will be placed under collarbone on left  side of chest with arrow pointing upward.  Rub patch adhesive wings for 2 minutes. Remove white label marked "1". Remove the white  label marked "2". Rub patch adhesive wings for 2 additional minutes.  While looking in a mirror, press and release button in center of patch. A small green light will  flash 3-4 times. This will be your only indicator that the monitor has been turned on.  Do not shower for the first 24 hours. You may  shower after the first 24 hours.  Press the button if you feel a symptom. You will hear a small click. Record Date, Time and  Symptom in the Patient Logbook.  When you are ready to remove the patch, follow instructions on the last 2 pages of Patient  Logbook. Stick patch monitor onto the last page of Patient Logbook.  Place Patient Logbook in the blue and white box. Use locking tab on box and tape box closed   securely. The blue and white box has prepaid postage on it. Please place it in the mailbox as  soon as possible. Your physician should have your test results approximately 7 days after the  monitor has been mailed back to [facility].  Call [facility] Customer Care at [phone] if you have questions regarding  your ZIO XT patch monitor. Call them immediately if you see an orange light blinking on your  monitor.  If your monitor falls off in less than 4 days, contact our Monitor department at [phone].  If your monitor becomes loose or falls off after 4 days call [employer] at [phone] for  suggestions on securing your monitor   Follow-Up: At [facility], you and your health needs are our priority.  As part of our continuing mission to provide you with exceptional heart care, we have created designated Provider Care Teams.  These Care Teams include your primary Cardiologist (physician) and Advanced Practice Providers (APPs -  Physician Assistants and Nurse Practitioners) who all work together to provide you with the care you need, when you need it.  We recommend signing up for the patient portal called "MyChart".  Sign up information is provided on this After Visit Summary.  MyChart is used to connect with patients for Virtual Visits (Telemedicine).  Patients are able to view lab/test results, encounter notes, upcoming appointments, etc.  Non-urgent messages can be sent to your provider as well.   To learn more about what you can do with MyChart, go to [email or web address].    Your next appointment:   3 month(s)  The format for your next appointment:   In Person  Provider:   Nanetta Batty, MD

## 2020-11-16 NOTE — Assessment & Plan Note (Signed)
History of postop PAF originally on Coumadin anticoagulation ultimately switched to Eliquis maintaining sinus rhythm on amiodarone.

## 2020-11-16 NOTE — Assessment & Plan Note (Signed)
History of severe mitral regurgitation with cardiac cath performed by myself revealing normal coronary arteries.  He underwent minimally invasive mitral valve repair by Dr. Cornelius Moras 09/22/2020 with complex valvuloplasty including PTFE Neochord  placement and a Medtronic Simuform  ring annuloplasty.  He is participating cardiac rehab and is doing well without symptoms.  We will recheck a 2D echocardiogram.

## 2020-11-16 NOTE — Assessment & Plan Note (Signed)
History of hyperlipidemia on statin therapy followed by his PCP 

## 2020-11-16 NOTE — Assessment & Plan Note (Signed)
History of essential hypertension a blood pressure measured today 140/80.  He is on losartan.

## 2020-11-19 ENCOUNTER — Emergency Department (HOSPITAL_BASED_OUTPATIENT_CLINIC_OR_DEPARTMENT_OTHER): Payer: 59

## 2020-11-19 ENCOUNTER — Emergency Department (HOSPITAL_BASED_OUTPATIENT_CLINIC_OR_DEPARTMENT_OTHER)
Admission: EM | Admit: 2020-11-19 | Discharge: 2020-11-20 | Disposition: A | Discharge status: Home / Self Care | Payer: 59 | Attending: Emergency Medicine | Admitting: Emergency Medicine

## 2020-11-19 ENCOUNTER — Telehealth: Payer: Self-pay | Admitting: Internal Medicine

## 2020-11-19 ENCOUNTER — Other Ambulatory Visit: Payer: Self-pay

## 2020-11-19 DIAGNOSIS — R519 Headache, unspecified: Secondary | ICD-10-CM | POA: Diagnosis present

## 2020-11-19 DIAGNOSIS — E039 Hypothyroidism, unspecified: Secondary | ICD-10-CM | POA: Diagnosis not present

## 2020-11-19 DIAGNOSIS — Z7982 Long term (current) use of aspirin: Secondary | ICD-10-CM | POA: Insufficient documentation

## 2020-11-19 DIAGNOSIS — Z7901 Long term (current) use of anticoagulants: Secondary | ICD-10-CM | POA: Diagnosis not present

## 2020-11-19 DIAGNOSIS — I10 Essential (primary) hypertension: Secondary | ICD-10-CM | POA: Diagnosis not present

## 2020-11-19 DIAGNOSIS — Z79899 Other long term (current) drug therapy: Secondary | ICD-10-CM | POA: Insufficient documentation

## 2020-11-19 DIAGNOSIS — Z87891 Personal history of nicotine dependence: Secondary | ICD-10-CM | POA: Diagnosis not present

## 2020-11-19 MED ORDER — APIXABAN 5 MG PO TABS: 5.0000 mg | ORAL_TABLET | Freq: Two times a day (BID) | ORAL | 5 refills | Status: DC

## 2020-11-19 NOTE — Telephone Encounter (Signed)
Pharmacy is calling in regarding the medication the pt was put on in the hospital

## 2020-11-19 NOTE — Telephone Encounter (Signed)
Prescription refill request for Eliquis received. Indication:atrial fib Last office visit:7/22 Scr:1.18 Age: 80 Weight:92.5 kg  Prescription refilled
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
## 2020-11-19 NOTE — Telephone Encounter (Signed)
Spoke to April, Pharmacist, stating pt is needing a refill on his Eliquis, that only had the starter pack on 10/24/2020 and will be out today.  She is aware I will send over to the pharmacy team and have them take care of it.
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
## 2020-11-20 NOTE — ED Notes (Signed)
Pt verbalizes understanding of discharge instructions. Opportunity for questioning and answers were provided. Chad Berry removed by staff, pt discharged from ED to home.  

## 2020-11-23 ENCOUNTER — Other Ambulatory Visit: Payer: Self-pay | Admitting: *Deleted

## 2020-11-23 ENCOUNTER — Encounter: Payer: Self-pay | Admitting: Internal Medicine

## 2020-11-23 ENCOUNTER — Ambulatory Visit (INDEPENDENT_AMBULATORY_CARE_PROVIDER_SITE_OTHER): Payer: 59 | Admitting: Internal Medicine

## 2020-11-23 ENCOUNTER — Other Ambulatory Visit: Payer: Self-pay

## 2020-11-23 VITALS — BP 150/86 | HR 67 | Ht 70.0 in | Wt 206.8 lb

## 2020-11-23 DIAGNOSIS — I480 Paroxysmal atrial fibrillation: Secondary | ICD-10-CM | POA: Diagnosis not present

## 2020-11-23 DIAGNOSIS — I82411 Acute embolism and thrombosis of right femoral vein: Secondary | ICD-10-CM

## 2020-11-23 DIAGNOSIS — I340 Nonrheumatic mitral (valve) insufficiency: Secondary | ICD-10-CM

## 2020-11-23 DIAGNOSIS — Z9889 Other specified postprocedural states: Secondary | ICD-10-CM | POA: Diagnosis not present

## 2020-11-23 NOTE — Progress Notes (Signed)
HPI Mr. Colvin returns today for followup. He is a pleasant 80 yo man with mitral valve regurge who underwent minimally invasive MV repair by Dr. 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 The patient also had PAF and was placed on amiodarone in addition to Eliquis for his DVT/PE. The patient has done well in the interim and has started cardiac rehab. He denies chest pain, sob, or palpitations. He is pending a Zio monitor. He has started to feel better but still feels like he is in a fog at times.  Allergies  Allergen Reactions   Contrast Media [Iodinated Diagnostic Agents] Hives, Nausea And Vomiting and Cough    Pt will need a 13 hour prep before any contrast per Dr. Paulina Fusi (05/14/19)   Hctz [Hydrochlorothiazide] Other (See Comments)    palpitations   Metoprolol Succinate [Metoprolol] Other (See Comments)    Bradycardia       Current Outpatient Medications  Medication Sig Dispense Refill   acetaminophen (TYLENOL) 500 MG tablet Take 1,000 mg by mouth every 6 (six) hours as needed for mild pain or moderate pain.     amiodarone (PACERONE) 200 MG tablet Take 1 tablet (200 mg total) by mouth 2 (two) times daily. X 7 days, then decrease to 200 mg daily 60 tablet 3   apixaban (ELIQUIS) 5 MG TABS tablet Take 1 tablet (5 mg total) by mouth 2 (two) times daily. 60 tablet 5   Ascorbic Acid (VITAMIN C) 1000 MG tablet Take 1,000 mg by mouth daily.     aspirin EC 81 MG EC tablet Take 1 tablet (81 mg total) by mouth daily. Swallow whole. 30 tablet 11   Cholecalciferol (VITAMIN D) 50 MCG (2000 UT) tablet Take 2,000 Units by mouth daily.     diphenhydramine-acetaminophen (TYLENOL PM) 25-500 MG TABS tablet Take 1 tablet by mouth at bedtime as needed (sleep).     ezetimibe (ZETIA) 10 MG tablet Take 10 mg by mouth at bedtime.     fluticasone (FLONASE) 50 MCG/ACT nasal spray Place 1 spray into both nostrils at bedtime.     levothyroxine (SYNTHROID) 75  MCG tablet Take 75 mcg by mouth at bedtime.     losartan (COZAAR) 50 MG tablet Take 2 tablets (100 mg total) by mouth daily. 60 tablet 0   rosuvastatin (CRESTOR) 40 MG tablet Take 40 mg by mouth at bedtime.     vitamin B-12 (CYANOCOBALAMIN) 100 MCG tablet Take 100 mcg by mouth daily.     No current facility-administered medications for this visit.     Past Medical History:  Diagnosis Date   Aortic atherosclerosis (HCC)    Bradycardia    while on BB with HR to the 40s   Carotid stenosis    CAROTID DOPPLER, 12/07/2011 - Mild arthrosclerotic changes, no high-grade stenosis   Cataract    rt. eye   Clostridium difficile infection 05/2019   Diverticulitis    Heart murmur    History of echocardiogram    Echo 9/16:  EF 60-65%, no RWMA, Gr 1 DD, holosystolic MVP of posterior leaflet, mild MR, LA upper limits of normal, normal RVF, mod TR, PASP 50 mmHg   Hypercholesteremia    Hyperlipidemia    Hypertension    Hypothyroidism    MVP (mitral valve prolapse)    a. Echo 12/07/2011 - EF-65-70%, severe mitral regurgitation, moderate-severe tricuspid regurgitation, moderate pulmonary HTN, moderate pulmonic regurgitation;  b. 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   Past Surgical History:  Procedure Laterality Date   COLONOSCOPY     ETHMOIDECTOMY Left  08/13/2020   Procedure: SPENOIDECTOMY AND ETHMOIDECTOMY;  Surgeon: Drema Halon, MD;  Location: [facility] OR;  Service: ENT;  Laterality: Left;   MITRAL VALVE REPAIR Right 09/22/2020   Procedure: MINIMALLY INVASIVE MITRAL VALVE REPAIR (MVR) USING MEDTRONIC SIMUFORM SEMI-RIGID ANNULOPLASTY RING SIZE ON PUMP;  Surgeon: Purcell Nails, MD;  Location: [facility] OR;  Service: Open Heart Surgery;  Laterality: Right;   RIGHT/LEFT HEART CATH AND CORONARY ANGIOGRAPHY N/A 08/30/2020   Procedure: RIGHT/LEFT HEART CATH AND CORONARY ANGIOGRAPHY;  Surgeon: Runell Gess, MD;  Location: [facility] INVASIVE CV LAB;  Service: Cardiovascular;  Laterality: N/A;   SIGMOIDOSCOPY  02/14/2017   SINUS ENDO WITH FUSION Left 08/13/2020   Procedure: SINUS ENDO WITH FUSION;  Surgeon: Drema Halon, MD;  Location: [facility] OR;  Service: ENT;  Laterality: Left;   TEE WITHOUT CARDIOVERSION N/A 05/04/2020   Procedure: TRANSESOPHAGEAL ECHOCARDIOGRAM (TEE);  Surgeon: Little Ishikawa, MD;  Location: [facility] ENDOSCOPY;  Service: Cardiovascular;  Laterality: N/A;   TEE WITHOUT CARDIOVERSION N/A 09/22/2020   Procedure: TRANSESOPHAGEAL ECHOCARDIOGRAM (TEE);  Surgeon: Purcell Nails, MD;  Location: [facility] OR;  Service: Open Heart Surgery;  Laterality: N/A;     Family History  Problem Relation Age of Onset   Stroke Mother 3   Heart failure Father    Diverticulitis Sister    Stroke Brother 45   Heart attack Brother    Colon cancer Neg Hx    Esophageal cancer Neg Hx    Stomach cancer Neg Hx    Rectal cancer Neg Hx      Social History   Socioeconomic History   Marital status: Married    Spouse name: Not on file   Number of children: Not on file   Years of education: Not on file   Highest education level: Not on file  Occupational History   Not on file  Tobacco Use   Smoking status: Former   Smokeless tobacco: Former    Types: Associate Professor Use: Never used  Substance and Sexual Activity   Alcohol use: Yes     Alcohol/week: 0.0 standard drinks    Comment: occasionally   Drug use: No   Sexual activity: Not on file  Other Topics Concern   Not on file  Social History Narrative   Married, owner of Facilities manager company   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 [place]   Second home [place], enjoys fihing, cars (Has ZR1)   Social Determinants of Health   Financial Resource Strain: Not on file  Food Insecurity: Not on file  Transportation Needs: Not on file  Physical Activity: Not on file  Stress: Not on file  Social Connections: Not on file  Intimate Partner Violence: Not on file     BP (!) 150/86   Pulse 67   Ht 5\' 10"  (1.778 m)   Wt 206 lb 12.8 oz (93.8 kg)   SpO2 96%   BMI 29.67 kg/m   Physical Exam:  Well appearing NAD HEENT: Unremarkable Neck:  No JVD, no thyromegally Lymphatics:  No adenopathy Back:  No CVA tenderness Lungs:  Clear HEART:  Regular rate rhythm, no murmurs, no rubs, no clicks Abd:  soft, positive bowel sounds, no organomegally, no rebound, no guarding Ext:  2 plus pulses, no edema, no cyanosis, no clubbing Skin:  No rashes no nodules Neuro:  CN II through XII intact, motor grossly intact  EKG - nsr with ILBBB  Assess/Plan:  PAF - he appears to be maintaining NSR. If his Zio monitor does not show evidence of PAF or significant arrhythmias, I would suggest he stop the amio and undergo watchful waiting.  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 3. MR - he is doing well s/p repair. His incisions look good. He has no MR on exam. 4. PVC's - he is asymptomatic at this point. I suspect that the amio has probably been helping to suppress the PVC's.    Keshayla Schrum,MD

## 2020-11-23 NOTE — Patient Instructions (Addendum)
Medication Instructions:  Your physician recommends that you continue on your current medications as directed. Please refer to the Current Medication list given to you today.  Labwork: None ordered.  Testing/Procedures: None ordered.  Follow-Up: Your physician wants you to follow-up in: September 2022 with Lewayne Bunting, MD   January 14, 2021 at 2:15 pm at the [facility] office  Any Other Special Instructions Will Be Listed Below (If Applicable).  If you need a refill on your cardiac medications before your next appointment, please call your pharmacy.

## 2020-11-24 ENCOUNTER — Other Ambulatory Visit (INDEPENDENT_AMBULATORY_CARE_PROVIDER_SITE_OTHER): Payer: 59

## 2020-11-24 DIAGNOSIS — I480 Paroxysmal atrial fibrillation: Secondary | ICD-10-CM | POA: Diagnosis not present

## 2020-11-24 NOTE — Progress Notes (Unsigned)
Patient enrolled for 14 day ZIO XT monitor to be mailed to his [address] 11/16/20. Monitor applied in office 11/24/2020.

## 2020-11-29 ENCOUNTER — Telehealth: Payer: Self-pay | Admitting: Internal Medicine

## 2020-11-29 ENCOUNTER — Other Ambulatory Visit: Payer: Self-pay | Admitting: Family Medicine

## 2020-11-29 DIAGNOSIS — G4483 Primary cough headache: Secondary | ICD-10-CM

## 2020-11-29 NOTE — Telephone Encounter (Signed)
Returned call.  Pt has worn Zio for 6 days.  Unfortunately ZIO is not MRI compatible.  Advised if Pt needs MRI would have Pt take off monitor right before scheduled MRI and send back in.

## 2020-11-29 NOTE — Telephone Encounter (Signed)
Pt was seen by Dr. Orland Mustard on Friday for headaches. Dr. Duaine Dredge wants to order a Brain MRI and Head MRA, pt has a Event Monitor for 2 more weeks, Cordelia Pen wants to know if the Event Monitor can be taken off long enough to have the tests? Please call Valetta Close at [facility] [phone]

## 2020-11-30 ENCOUNTER — Ambulatory Visit
Admission: RE | Admit: 2020-11-30 | Discharge: 2020-11-30 | Disposition: A | Discharge status: Home / Self Care | Payer: 59 | Source: Ambulatory Visit | Attending: Family Medicine | Admitting: Family Medicine

## 2020-11-30 ENCOUNTER — Other Ambulatory Visit: Payer: Self-pay

## 2020-11-30 DIAGNOSIS — G4483 Primary cough headache: Secondary | ICD-10-CM

## 2020-12-02 ENCOUNTER — Other Ambulatory Visit: Payer: 59

## 2020-12-06 ENCOUNTER — Other Ambulatory Visit: Payer: Self-pay

## 2020-12-06 ENCOUNTER — Telehealth: Payer: Self-pay | Admitting: Cardiovascular Disease

## 2020-12-06 ENCOUNTER — Ambulatory Visit (HOSPITAL_COMMUNITY): Payer: 59 | Attending: Cardiology

## 2020-12-06 DIAGNOSIS — Z9889 Other specified postprocedural states: Secondary | ICD-10-CM

## 2020-12-06 DIAGNOSIS — Z954 Presence of other heart-valve replacement: Secondary | ICD-10-CM | POA: Diagnosis not present

## 2020-12-06 LAB — ECHOCARDIOGRAM COMPLETE
Area-P 1/2: 2.48 cm2
MV VTI: 1.61 cm2
S' Lateral: 3.7 cm

## 2020-12-06 NOTE — Telephone Encounter (Signed)
New message    Patient had echo today and stopped by my desk inquiring about his monitor results.  I told him I will send a message to the nurse.  Please call pt with monitor results.

## 2020-12-06 NOTE — Telephone Encounter (Signed)
Returned call to pt informed him that we do not have the monitor results yet informed that it does take awhile because of the mail. Verbalized understanding he will await call

## 2020-12-08 ENCOUNTER — Other Ambulatory Visit: Payer: Self-pay | Admitting: *Deleted

## 2020-12-08 DIAGNOSIS — I340 Nonrheumatic mitral (valve) insufficiency: Secondary | ICD-10-CM

## 2020-12-08 NOTE — Progress Notes (Signed)
echo

## 2020-12-10 ENCOUNTER — Encounter: Payer: Self-pay | Admitting: *Deleted

## 2020-12-16 ENCOUNTER — Other Ambulatory Visit: Payer: Self-pay | Admitting: Cardiovascular Disease

## 2020-12-21 ENCOUNTER — Telehealth: Payer: Self-pay | Admitting: Cardiovascular Disease

## 2020-12-21 NOTE — Telephone Encounter (Signed)
Spoke with the patient who states that he has been having bad headaches and his PCP has done CT scans and an MRI and they come back negative. He was wanting to know if any of his cardiac medications could be causing headaches. He reports that his blood pressure has been good: 117/80, 130/79. He states he is staying hydrated. Advised that he may want to follow up with a neurologist.  He states he has been doing really well at cardiac rehab. He has been able to work out more and is getting stronger.  Patient also inquiring about echocardiogram results which I have reviewed with him.  Patient also states that Dr. Ladona Ridgel told him he was going to consider taking away some of his medications based on his heart monitor but he has not heard.

## 2020-12-21 NOTE — Telephone Encounter (Signed)
   Pt is requesting to speak with Dr. Hazle Coca nurse. He said he has questions about his past procedure done in May

## 2020-12-21 NOTE — Telephone Encounter (Signed)
Attempted to call patient back. Unable to leave voicemail.  

## 2020-12-22 NOTE — Telephone Encounter (Signed)
Outreach made to Pt.  HM results reviewed by Dr. Ladona Ridgel.  Per Dr. Thayer Ohm amiodarone.  Pt advised.  Reminded Pt of follow up.

## 2021-01-14 ENCOUNTER — Ambulatory Visit (INDEPENDENT_AMBULATORY_CARE_PROVIDER_SITE_OTHER): Payer: 59 | Admitting: Internal Medicine

## 2021-01-14 ENCOUNTER — Other Ambulatory Visit: Payer: Self-pay

## 2021-01-14 VITALS — BP 148/72 | HR 65 | Ht 70.0 in | Wt 205.0 lb

## 2021-01-14 DIAGNOSIS — I480 Paroxysmal atrial fibrillation: Secondary | ICD-10-CM | POA: Diagnosis not present

## 2021-01-14 NOTE — Progress Notes (Signed)
HPI Chad Berry returns today for followup. He is a pleasant 80 yo man with mitral valve regurge who underwent minimally invasive MV repair by Dr. 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 The patient also had PAF and was placed on amiodarone in addition to Eliquis for his DVT/PE. The patient has done well in the interim and has started cardiac rehab. He denies chest pain, sob, or palpitations. He is pending a Zio monitor. He has started to feel better but still feels like he is in a fog at times.  Allergies  Allergen Reactions   Contrast Media [Iodinated Diagnostic Agents] Hives, Nausea And Vomiting and Cough    Pt will need a 13 hour prep before any contrast per Dr. Paulina Fusi (05/14/19)   Hctz [Hydrochlorothiazide] Other (See Comments)    palpitations   Metoprolol Succinate [Metoprolol] Other (See Comments)    Bradycardia       Current Outpatient Medications  Medication Sig Dispense Refill   acetaminophen (TYLENOL) 500 MG tablet Take 1,000 mg by mouth every 6 (six) hours as needed for mild pain or moderate pain.     apixaban (ELIQUIS) 5 MG TABS tablet Take 1 tablet (5 mg total) by mouth 2 (two) times daily. 60 tablet 5   Ascorbic Acid (VITAMIN C) 1000 MG tablet Take 1,000 mg by mouth daily.     aspirin EC 81 MG EC tablet Take 1 tablet (81 mg total) by mouth daily. Swallow whole. 30 tablet 11   Cholecalciferol (VITAMIN D) 50 MCG (2000 UT) tablet Take 2,000 Units by mouth daily.     diphenhydramine-acetaminophen (TYLENOL PM) 25-500 MG TABS tablet Take 1 tablet by mouth at bedtime as needed (sleep).     ezetimibe (ZETIA) 10 MG tablet Take 10 mg by mouth at bedtime.     fluticasone (FLONASE) 50 MCG/ACT nasal spray Place 1 spray into both nostrils at bedtime.     levothyroxine (SYNTHROID) 75 MCG tablet Take 75 mcg by mouth at bedtime.     rosuvastatin (CRESTOR) 40 MG tablet Take 40 mg by mouth at bedtime.     vitamin B-12 (CYANOCOBALAMIN)  100 MCG tablet Take 100 mcg by mouth daily.     losartan (COZAAR) 50 MG tablet Take 2 tablets (100 mg total) by mouth daily. 60 tablet 0   No current facility-administered medications for this visit.     Past Medical History:  Diagnosis Date   Aortic atherosclerosis (HCC)    Bradycardia    while on BB with HR to the 40s   Carotid stenosis    CAROTID DOPPLER, 12/07/2011 - Mild arthrosclerotic changes, no high-grade stenosis   Cataract    rt. eye   Clostridium difficile infection 05/2019   Diverticulitis    Heart murmur    History of echocardiogram    Echo 9/16:  EF 60-65%, no RWMA, Gr 1 DD, holosystolic MVP of posterior leaflet, mild MR, LA upper limits of normal, normal RVF, mod TR, PASP 50 mmHg   Hypercholesteremia    Hyperlipidemia    Hypertension    Hypothyroidism    MVP (mitral valve prolapse)    a. Echo 12/07/2011 - EF-65-70%, severe mitral regurgitation, moderate-severe tricuspid regurgitation, moderate pulmonary HTN, moderate pulmonic regurgitation;  b. 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   Past Surgical History:  Procedure Laterality Date   COLONOSCOPY     ETHMOIDECTOMY Left 08/13/2020   Procedure: SPENOIDECTOMY AND ETHMOIDECTOMY;  Surgeon: Drema Halon, MD;  Location: [facility] OR;  Service: ENT;  Laterality: Left;    MITRAL VALVE REPAIR Right 09/22/2020   Procedure: MINIMALLY INVASIVE MITRAL VALVE REPAIR (MVR) USING MEDTRONIC SIMUFORM SEMI-RIGID ANNULOPLASTY RING SIZE ON PUMP;  Surgeon: Purcell Nails, MD;  Location: [facility] OR;  Service: Open Heart Surgery;  Laterality: Right;   RIGHT/LEFT HEART CATH AND CORONARY ANGIOGRAPHY N/A 08/30/2020   Procedure: RIGHT/LEFT HEART CATH AND CORONARY ANGIOGRAPHY;  Surgeon: Runell Gess, MD;  Location: [facility] INVASIVE CV LAB;  Service: Cardiovascular;  Laterality: N/A;   SIGMOIDOSCOPY  02/14/2017   SINUS ENDO WITH FUSION Left 08/13/2020   Procedure: SINUS ENDO WITH FUSION;  Surgeon: Drema Halon, MD;  Location: [facility] OR;  Service: ENT;  Laterality: Left;   TEE WITHOUT CARDIOVERSION N/A 05/04/2020   Procedure: TRANSESOPHAGEAL ECHOCARDIOGRAM (TEE);  Surgeon: Little Ishikawa, MD;  Location: [facility] ENDOSCOPY;  Service: Cardiovascular;  Laterality: N/A;   TEE WITHOUT CARDIOVERSION N/A 09/22/2020   Procedure: TRANSESOPHAGEAL ECHOCARDIOGRAM (TEE);  Surgeon: Purcell Nails, MD;  Location: [facility] OR;  Service: Open Heart Surgery;  Laterality: N/A;     Family History  Problem Relation Age of Onset   Stroke Mother 48   Heart failure Father    Diverticulitis Sister    Stroke Brother 56   Heart attack Brother    Colon cancer Neg Hx    Esophageal cancer Neg Hx    Stomach cancer Neg Hx    Rectal cancer Neg Hx      Social History   Socioeconomic History   Marital status: Married    Spouse name: Not on file   Number of children: Not on file   Years of education: Not on file   Highest education level: Not on file  Occupational History   Not on file  Tobacco Use   Smoking status: Former   Smokeless tobacco: Former    Types: Associate Professor Use: Never used  Substance and Sexual Activity   Alcohol use: Yes    Alcohol/week: 0.0 standard drinks    Comment: occasionally   Drug use: No   Sexual activity: Not on file  Other Topics Concern   Not on file   Social History Narrative   Married, owner of Facilities manager company   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 [place]   Second home [place], enjoys fihing, cars (Has ZR1)   Social Determinants of Health   Financial Resource Strain: Not on file  Food Insecurity: Not on file  Transportation Needs: Not on file  Physical Activity: Not on file  Stress: Not on file  Social Connections: Not on file  Intimate Partner Violence: Not on file     BP (!) 148/72   Pulse 65   Ht 5\' 10"  (1.778 m)  Wt 205 lb (93 kg)   SpO2 95%   BMI 29.41 kg/m   Physical Exam:  Well appearing NAD HEENT: Unremarkable Neck:  No JVD, no thyromegally Lymphatics:  No adenopathy Back:  No CVA tenderness Lungs:  Clear HEART:  Regular rate rhythm, no murmurs, no rubs, no clicks Abd:  soft, positive bowel sounds, no organomegally, no rebound, no guarding Ext:  2 plus pulses, no edema, no cyanosis, no clubbing Skin:  No rashes no nodules Neuro:  CN II through XII intact, motor grossly intact  EKG  DEVICE  Normal device function.  See PaceArt for details.   Assess/Plan:   PAF - he appears to be maintaining NSR. His Zio monitor does not show evidence of PAF or significant arrhythmias, and he has stopped the amio and he will undergo watchful waiting.   2.  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 3. MR - he is doing well s/p repair. His incisions look good. He has no MR on exam. He will stop the ASA. I have discussed with Dr. Allyson Sabal. 4. PVC's - he is asymptomatic at this point. His monitor was reassuring and he will undergo watchful waiting.  Sharlot Gowda Providence Stivers,MD

## 2021-02-01 ENCOUNTER — Other Ambulatory Visit: Payer: Self-pay

## 2021-02-01 ENCOUNTER — Ambulatory Visit (HOSPITAL_COMMUNITY)
Admission: RE | Admit: 2021-02-01 | Discharge: 2021-02-01 | Disposition: A | Discharge status: Home / Self Care | Payer: 59 | Source: Ambulatory Visit | Attending: Cardiovascular Disease | Admitting: Cardiovascular Disease

## 2021-02-01 DIAGNOSIS — I82401 Acute embolism and thrombosis of unspecified deep veins of right lower extremity: Secondary | ICD-10-CM | POA: Diagnosis not present

## 2021-02-01 DIAGNOSIS — R519 Headache, unspecified: Secondary | ICD-10-CM

## 2021-02-21 ENCOUNTER — Ambulatory Visit (INDEPENDENT_AMBULATORY_CARE_PROVIDER_SITE_OTHER): Payer: 59 | Admitting: Psychiatry

## 2021-02-21 ENCOUNTER — Encounter: Payer: Self-pay | Admitting: Psychiatry

## 2021-02-21 VITALS — BP 114/69 | HR 63 | Ht 70.0 in | Wt 210.0 lb

## 2021-02-21 DIAGNOSIS — G4452 New daily persistent headache (NDPH): Secondary | ICD-10-CM | POA: Diagnosis not present

## 2021-02-21 DIAGNOSIS — G08 Intracranial and intraspinal phlebitis and thrombophlebitis: Secondary | ICD-10-CM

## 2021-02-21 MED ORDER — GABAPENTIN 300 MG PO CAPS: ORAL_CAPSULE | ORAL | 3 refills | Status: DC

## 2021-02-21 NOTE — Patient Instructions (Addendum)
MRI to look at the veins in your head (MRV) 2.  Start gabapentin 300 mg at bedtime for 5 days, then increase to 300 mg twice a day, then increase to 300 mg three times a day. 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 This is a headache that begins one day and continues daily for at least 3 months. People with NDPH can often recall the exact day when the pain began. About 50% of those with NDPH will have associated migrainous features including throbbing pain, sensitivity to light, sensitivity to sound, or nausea. This can occur at any age, and is 2-3 times more common in women. It is sometimes preceded by a flu-like illness or a cold, a stressful life event, or a surgery. However, there is no clear inciting event in at least 50% of NDPH cases. Imaging of the brain should be done to rule out secondary causes of the headache, but imaging will be normal in most cases of NDPH.  Often people with NDPH will have a component of medication overuse headache from overuse of over the counter pain medications like ibuprofen or tylenol. We recommend limiting the use of over the counter medications to less than 2 days per week or 10 days per month. Using these medications more frequently can contribute to the headache and even make it worse.  We use daily preventative medications to treat NDPH. Lifestyle changes such as maintaining regular sleep, exercise, and a good diet can also help reduce headaches. Other non-medication options include behavioral therapy and biofeedback.

## 2021-02-21 NOTE — Progress Notes (Signed)
Referring:  Runell Gess, MD [address] [place],  [place] [address]  PCP: Mosetta Putt, MD  Neurology was asked to evaluate Chad Berry, a 80 year old male for a chief complaint of headaches.  Our recommendations of care will be communicated by shared medical record.    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 Course was complicated by DVT with PE. He was put on amiodarone for post-op afib which seemed to worsen his headaches.  Hit his head in July which worsened his headaches. Did not lose consciousness. Had persistent tenderness of his scalp following head injury. This has improved in the past month.  He has since stopped amiodarone which did improve his headache severity. Has baseline 1/10 pain but will go up to 5/10 if he coughs, sneezes, or bends forward. Has had a few pain free days this month, but they are uncommon. He has tried Tylenol which barely touches the pain. Headaches are best in the morning and get worse as the day goes on.  MRI/MRA brain in August were unremarkable.  Did not have many headaches prior to May. Notes he would have feelings of fogginess prior to surgery but not a lot of pain.  Headache History: Onset: May 2022 Triggers: coughing, sneezing, bending forward, turning head Aura: no Location: occiput, neck Quality/Description: pressure Severity: 1-7/10 Associated Symptoms:  Photophobia: no  Phonophobia: no  Nausea: yes Vomiting: no Allodynia: yes Other symptoms: rhinorrhea on right side Worse with activity?: yes Duration of headaches: constant 1/10 pain with 15-30 minute exacerbations Red flags:   New onset age>50  Positional component   Headache days per month: 27 Headache free days per month:  3  Current Treatment: Abortive none  Preventative none  Prior Therapies                                 Metoprolol - bradycardia losartan  Headache Risk Factors: Headache risk factors and/or co-morbidities (-) Neck Pain (-) History of Motor Vehicle Accident (-) Fibromyalgia (+) Obesity  Body mass index is 30.13 kg/m. (+) History of Traumatic Brain Injury and/or Concussion (-) History of Syncope (-) TMJ Dysfunction/Bruxism  LABS: CBC    Component Value Date/Time   WBC 5.8 11/10/2020 0926   RBC 4.69 11/10/2020 0926   HGB 14.7 11/10/2020 0926   HGB 14.0 08/25/2020 0954   HCT 43.2 11/10/2020 0926   HCT 40.0 08/25/2020 0954   PLT 189 11/10/2020 0926   PLT 157 08/25/2020 0954   MCV 92.1 11/10/2020 0926   MCV 95 08/25/2020 0954   MCH 31.3 11/10/2020 0926   MCHC 34.0 11/10/2020 0926   RDW 13.9 11/10/2020 0926   RDW 12.5 08/25/2020 0954   LYMPHSABS 1.0 11/08/2020 1302   MONOABS 0.6 11/08/2020 1302   EOSABS 0.1 11/08/2020 1302   BASOSABS 0.0 11/08/2020 1302   BMP Latest Ref Rng & Units 11/10/2020 11/08/2020 10/24/2020  Glucose 70 - 99 mg/dL 786(V) 672(C) 947(S)  BUN 8 - 23 mg/dL 12 12 11   Creatinine 0.61 - 1.24 mg/dL 9.62 8.36  BUN/Creat Ratio 10 - 24 - - -  Sodium 135 - 145 mmol/L 138 137 137  Potassium 3.5 - 5.1 mmol/L 4.2 4.3 4.2  Chloride 98 - 111 mmol/L 104 105 105  CO2 22 - 32 mmol/L 23 24 26   Calcium 8.9 - 10.3 mg/dL 9.7 9.0 )     IMAGING:  MRI brain 11/30/20: few foci of chronic blood products in central white matter, otherwise unremarkable  MRA head 11/30/20: unremarkable  CT sinus 05/06/20: chronic left sphenoid sinus disease, turbinate hypertrophy, right septal deviation   Current Outpatient Medications on File Prior to Visit  Medication Sig Dispense Refill   acetaminophen (TYLENOL) 500 MG tablet Take 1,000 mg by mouth every 6 (six) hours as needed for mild pain or moderate pain.     apixaban (ELIQUIS) 5 MG TABS tablet Take 1 tablet (5 mg  total) by mouth 2 (two) times daily. 60 tablet 5   Ascorbic Acid (VITAMIN C) 1000 MG tablet Take 1,000 mg by mouth daily.     aspirin EC 81 MG EC tablet Take 1 tablet (81 mg total) by mouth daily. Swallow whole. 30 tablet 11   Cholecalciferol (VITAMIN D) 50 MCG (2000 UT) tablet Take 2,000 Units by mouth daily.     diphenhydramine-acetaminophen (TYLENOL PM) 25-500 MG TABS tablet Take 1 tablet by mouth at bedtime as needed (sleep).     ezetimibe (ZETIA) 10 MG tablet Take 10 mg by mouth at bedtime.     fluticasone (FLONASE) 50 MCG/ACT nasal spray Place 1 spray into both nostrils at bedtime.     levothyroxine (SYNTHROID) 75 MCG tablet Take 75 mcg by mouth at bedtime.     rosuvastatin (CRESTOR) 40 MG tablet Take 40 mg by mouth at bedtime.     vitamin B-12 (CYANOCOBALAMIN) 100 MCG tablet Take 100 mcg by mouth daily.     losartan (COZAAR) 50 MG tablet Take 2 tablets (100 mg total) by mouth daily. 60 tablet 0   No current facility-administered medications on file prior to visit.     Allergies: Allergies  Allergen Reactions   Contrast Media [Iodinated Diagnostic Agents] Hives, Nausea And Vomiting and Cough    Pt will need a 13 hour prep before any contrast per Dr. 07/04/20 (05/14/19)   Hctz [Hydrochlorothiazide] Other (See Comments)    palpitations   Metoprolol Succinate [Metoprolol] Other (See Comments)    Bradycardia      Family History: Migraine or other headaches in the family:  yes Aneurysms in a first degree relative:  no Brain tumors in the family:  no Other neurological illness in the family:   yes  Past Medical History: Past Medical History:  Diagnosis Date   Aortic atherosclerosis (HCC)    Bradycardia    while on BB with HR to the 40s   Carotid stenosis    CAROTID DOPPLER, 12/07/2011 - Mild arthrosclerotic changes, no high-grade stenosis   Cataract    rt. eye   Clostridium difficile infection 05/2019   Diverticulitis    Heart murmur    History of echocardiogram     Echo 9/16:  EF 60-65%, no RWMA, Gr 1 DD, holosystolic MVP of posterior leaflet, mild MR, LA upper limits of normal, normal RVF, mod TR, PASP 50 mmHg   Hypercholesteremia    Hyperlipidemia    Hypertension    Hypothyroidism    MVP (mitral valve prolapse)    a. Echo 12/07/2011 - EF-65-70%, severe mitral regurgitation, moderate-severe tricuspid regurgitation, moderate pulmonary HTN, moderate pulmonic regurgitation;  b. 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   Complex valvuloplasty including artificial Gore-tex neochords x10 with 28 mm Medtronic Simuform ring annuloplasty   Testicular hypofunction    Vitamin D deficiency     Past Surgical History Past Surgical History:  Procedure Laterality Date   COLONOSCOPY     ETHMOIDECTOMY Left 08/13/2020   Procedure: SPENOIDECTOMY AND ETHMOIDECTOMY;  Surgeon: Drema Halon, MD;  Location: [facility] OR;  Service: ENT;  Laterality: Left;   MITRAL VALVE REPAIR Right 09/22/2020   Procedure: MINIMALLY INVASIVE MITRAL VALVE REPAIR (MVR) USING MEDTRONIC SIMUFORM SEMI-RIGID ANNULOPLASTY RING SIZE ON PUMP;  Surgeon: Purcell Nails, MD;  Location: [facility] OR;  Service: Open Heart Surgery;  Laterality: Right;   RIGHT/LEFT HEART CATH AND CORONARY ANGIOGRAPHY N/A 08/30/2020   Procedure: RIGHT/LEFT HEART CATH AND CORONARY ANGIOGRAPHY;  Surgeon: Runell Gess, MD;  Location: [facility] INVASIVE CV LAB;  Service: Cardiovascular;  Laterality: N/A;   SIGMOIDOSCOPY  02/14/2017   SINUS ENDO WITH FUSION Left 08/13/2020   Procedure: SINUS ENDO WITH FUSION;  Surgeon: Drema Halon, MD;  Location: [facility] OR;  Service: ENT;  Laterality: Left;   TEE WITHOUT CARDIOVERSION N/A 05/04/2020   Procedure: TRANSESOPHAGEAL ECHOCARDIOGRAM (TEE);  Surgeon: Little Ishikawa, MD;  Location: [facility] ENDOSCOPY;  Service: Cardiovascular;  Laterality: N/A;   TEE WITHOUT CARDIOVERSION N/A 09/22/2020   Procedure: TRANSESOPHAGEAL ECHOCARDIOGRAM (TEE);  Surgeon: Purcell Nails, MD;  Location[facility] OR;  Service: Open Heart Surgery;  Laterality: N/A;    Social History: Social History   Tobacco Use   Smoking status: Former   Smokeless tobacco: Former    Types: Associate Professor Use: Never used  Substance Use Topics   Alcohol use: Yes    Alcohol/week: 0.0 standard drinks    Comment: occasionally   Drug use: No    ROS: Negative for fevers, chills. Positive for headaches. All other systems reviewed and negative unless stated otherwise in HPI.   Physical Exam:   Vital Signs: BP 114/69   Pulse 63   Ht 5\' 10"  (1.778 m)   Wt 210 lb (95.3 kg)   BMI 30.13 kg/m  GENERAL: well appearing,in no acute distress,alert SKIN:  Color, texture, turgor normal. No rashes or lesions HEAD:  Normocephalic/atraumatic. CV:  RRR RESP: Normal respiratory effort MSK: no tenderness to palpation over occiput, neck, or shoulders  NEUROLOGICAL: Mental Status: Alert, oriented to person, place and time,Follows commands Cranial Nerves: PERRL, fundi poorly visualized due to cataracts but no clear papilledema, visual fields intact to confrontation,extraocular movements intact,facial sensation intact,no facial droop or ptosis,hearing intact to finger rub bilaterally,no dysarthria,palate elevate symmetrically,tongue protrudes midline,shoulder shrug intact and symmetric Motor: muscle strength 5/5 both upper and lower extremities,no drift, normal tone Reflexes: 2+ throughout Sensation: intact to light touch all 4 extremities Coordination: Finger-to- nose-finger intact bilaterally,Heel-to-shin  intact bilaterally Gait: normal-based   IMPRESSION: 80 year old male with a history of recent DVT/PE, HTN, HLD, CKD3 who presents for evaluation of headaches which began following mitral valve repair in May 2022. His headaches have been improving over time, though he continues to have severe headaches with valsalva and bending forward. MRI without contrast and MRA were unremarkable. Will  check MRV to rule out CVST. In the meantime will start gabapentin for headache prevention.  PLAN: -MRV -Start gabapentin (300 mg daily x5 days, then 300 mg BID x5 days, then 300 mg TID) -next steps: consider topamax/zonisamide or diamox for cough component of headache if no improvement with gabapentin   I spent a total of 52 minutes chart reviewing and counseling the patient. Headache education was done. Discussed treatment options including preventive and acute medications, natural supplements, and physical therapy. Discussed medication side effects, adverse reactions and drug interactions. Written educational materials and patient instructions outlining all of the above were given.  Follow-up: 3 months   Ocie Doyne, MD 02/21/2021   8:53 AM

## 2021-02-22 ENCOUNTER — Telehealth: Payer: Self-pay | Admitting: Psychiatry

## 2021-02-22 NOTE — Telephone Encounter (Signed)
Medicare/[employer] Berkley Harvey: [id number] (exp. 02/22/21 to 04/08/21) order sent to GI, they will reach out to the patient to schedule.

## 2021-02-23 ENCOUNTER — Encounter: Payer: Self-pay | Admitting: Cardiovascular Disease

## 2021-02-23 ENCOUNTER — Ambulatory Visit (INDEPENDENT_AMBULATORY_CARE_PROVIDER_SITE_OTHER): Payer: 59 | Admitting: Cardiovascular Disease

## 2021-02-23 ENCOUNTER — Other Ambulatory Visit: Payer: Self-pay

## 2021-02-23 DIAGNOSIS — I340 Nonrheumatic mitral (valve) insufficiency: Secondary | ICD-10-CM | POA: Diagnosis not present

## 2021-02-23 DIAGNOSIS — I480 Paroxysmal atrial fibrillation: Secondary | ICD-10-CM | POA: Diagnosis not present

## 2021-02-23 DIAGNOSIS — I82411 Acute embolism and thrombosis of right femoral vein: Secondary | ICD-10-CM

## 2021-02-23 DIAGNOSIS — E782 Mixed hyperlipidemia: Secondary | ICD-10-CM

## 2021-02-23 DIAGNOSIS — I10 Essential (primary) hypertension: Secondary | ICD-10-CM | POA: Diagnosis not present

## 2021-02-23 NOTE — Patient Instructions (Signed)

## 2021-02-23 NOTE — Assessment & Plan Note (Signed)
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  He was on Coumadin and was transitioned to Eliquis.  Recent lower extremity venous Doppler study showed resolution of his right lower extremity DVT.  Because he had a pulmonary embolism I am going to keep him on oral anticoagulation for at least 6 months.

## 2021-02-23 NOTE — Progress Notes (Signed)
02/23/2021 Chad Berry   1940/10/09  [id number]  Primary Physician Mosetta Putt, MD Primary Cardiologist: Runell Gess MD [name]  HPI:  Chad Berry is a 80 y.o. moderately overweight, married Caucasian male, father of 2, grandfather to 2 grandchildren whose son Chad Berry is also a patient of mine, as is his wife. I last saw him 11/16/2020. He was worked up for palpitations and had an echo that showed severe MR with mild mitral valve prolapse and mild LV size and function. His other problems include hypertension and hyperlipidemia. He lives an active lifestyle and is actually heading out to [place] to go coyote hunting for a week. He has changed his diet and has lost 15 pounds. He has cut out caffeine and alcohol. He has had 2 negative sleep studies. He is otherwise asymptomatic. Since I saw him several years ago he has seen Tereso Newcomer in the office for evaluation of palpitations. A 2-D echo revealed normal LV function with holosystolic mitral valve prolapse, mild MR and moderate TR. A 48 hour Holter monitor showed PVCs, short runs of nonsustained ventricular tachycardia and short runs of SVT. Since stopping his diuretic his palpitations have improved.   I did refer him to Dr. Ladona Ridgel who recommended conservative therapy noting he had PACs and PVCs.  He recommended avoidance of caffeine and alcohol.  He is referred back by Dr. Duaine Dredge for follow-up of his mitral regurgitation because of a murmur which he thought was louder than he had remembered.  Repeat 2D echo performed 11/14/2017 revealed moderate to severe MR with posterior leaflet prolapse, normal LV size and function.  He is completely asymptomatic.  He purchased his ZR1 Corvette and recently rode [place] 150 mph.   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    He does complain of increasing dyspnea over the last  6 to 12 months.  He had a transesophageal echo performed by Dr. Bjorn Pippin 05/04/2020 revealing a flail P2 segment with severe MR.  He wishes to proceed with mini mitral repair with Dr. Cornelius Moras.  I performed right and left heart cath revealing normal coronary arteries and severe MR.  He underwent mitral valve repair minimally invasively by Dr. Cornelius Moras 09/22/2020 and was discharged several days later.  He was readmitted 10/22/2020 with chest pain was found to have a DVT on the right with a pulmonary embolus.  His Coumadin was subtherapeutic which she was originally begun on because of perioperative PAF and he was transitioned to Eliquis oral anticoagulation.  He is currently participating cardiac rehab.  Since I saw him 3 months ago I did send him to Dr. Ladona Ridgel because of his PAF.  A Zio patch did not show any recurrent arrhythmias.  He remains on Eliquis.  He is still participating cardiac rehab.  He does 40 minutes on a bicycle, treadmill and stair stepper without symptoms.  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  He is very active and says that his energy level is back up to his preoperative level.  A follow-up 2D echo performed 12/06/2020 revealed normal LV systolic function with a well-functioning mitral valve and no regurgitation.  Lower extremity venous Doppler studies recently performed 02/01/2021 showed resolution of his right lower extremity DVT.   Current Meds  Medication Sig   apixaban (ELIQUIS) 5 MG TABS tablet Take 1 tablet (  5 mg total) by mouth 2 (two) times daily.   Ascorbic Acid (VITAMIN C) 1000 MG tablet Take 1,000 mg by mouth daily.   Cholecalciferol (VITAMIN D) 50 MCG (2000 UT) tablet Take 2,000 Units by mouth daily.   ezetimibe (ZETIA) 10 MG tablet Take 10 mg by mouth at bedtime.   gabapentin (NEURONTIN) 300 MG capsule Take 300 mg (1 pill) at bedtime for 5 days, then take 300 mg twice a day for 5 days, then take 300 mg three times a day   levothyroxine (SYNTHROID) 75 MCG tablet Take 75  mcg by mouth at bedtime.   losartan (COZAAR) 50 MG tablet Take 2 tablets (100 mg total) by mouth daily.   rosuvastatin (CRESTOR) 40 MG tablet Take 40 mg by mouth at bedtime.   vitamin B-12 (CYANOCOBALAMIN) 100 MCG tablet Take 100 mcg by mouth daily.     Allergies  Allergen Reactions   Contrast Media [Iodinated Diagnostic Agents] Hives, Nausea And Vomiting and Cough    Pt will need a 13 hour prep before any contrast per Dr. Paulina Fusi (05/14/19)   Hctz [Hydrochlorothiazide] Other (See Comments)    palpitations   Metoprolol Succinate [Metoprolol] Other (See Comments)    Bradycardia      Social History   Socioeconomic History   Marital status: Married    Spouse name: Not on file   Number of children: Not on file   Years of education: Not on file   Highest education level: Not on file  Occupational History   Not on file  Tobacco Use   Smoking status: Former   Smokeless tobacco: Former    Types: Associate Professor Use: Never used  Substance and Sexual Activity   Alcohol use: Yes    Alcohol/week: 0.0 standard drinks    Comment: occasionally   Drug use: No   Sexual activity: Not on file  Other Topics Concern   Not on file  Social History Narrative   Married, owner of Facilities manager company   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  Cardiovascular: negative for chest pain, dyspnea on exertion, edema, orthopnea, palpitations, paroxysmal nocturnal dyspnea or shortness of breath Dermatological: negative for rash Respiratory: negative for cough or wheezing Urologic:  negative for hematuria Abdominal: negative for nausea, vomiting, diarrhea, bright red blood per rectum, melena, or hematemesis Neurologic: negative for visual changes, syncope, or dizziness All other systems reviewed and are otherwise negative except as noted above.    Blood pressure 132/78, pulse (!) 56, height 5\' 10"  (1.778 m), weight 207 lb 6.4 oz (94.1 kg), SpO2 97 %.  General appearance: alert and no distress Neck: no adenopathy, no carotid bruit, no JVD, supple, symmetrical, trachea midline, and thyroid not enlarged, symmetric, no tenderness/mass/nodules Lungs: clear to auscultation bilaterally Heart: regular rate and rhythm, S1, S2 normal, no murmur, click, rub or gallop Extremities: extremities normal, atraumatic, no cyanosis or edema Pulses: 2+ and symmetric Skin: Skin color, texture, turgor normal. No rashes or lesions Neurologic: Grossly normal  EKG not performed today  ASSESSMENT AND PLAN:   Essential hypertension History of essential hypertension a blood pressure measured today at 132/78.  He is on losartan.  Hyperlipidemia History of hyperlipidemia on statin therapy with lipid profile followed by his PCP  Mitral regurgitation History of mitral regurgitation status post minimally invasive invasive mitral valve repair by Dr. Priscille Kluver 09/22/2020.  His most recent 2D echocardiogram performed 12/06/2020 revealed normal LV systolic function with a well-functioning mitral valve and no evidence of regurgitation.  We will continue to follow this on an annual basis.  Paroxysmal atrial fibrillation (HCC) History of perioperative PAF on amiodarone in the past.  This was discontinued by Dr. Ladona Ridgel.  A Zio patch showed no arrhythmias.  He is on Eliquis oral anticoagulation.  DVT (deep venous thrombosis) (HCC) History of DVT and pulmonary embolism back in June.  He was on Coumadin and was transitioned to Eliquis.  Recent lower extremity venous Doppler study showed resolution of his right  lower extremity DVT.  Because he had a pulmonary embolism I am going to keep him on oral anticoagulation for at least 6 months.     Runell Gess MD FACP,FACC,FAHA, [facility] 02/23/2021 12:29 PM

## 2021-02-23 NOTE — Assessment & Plan Note (Signed)
History of mitral regurgitation status post minimally invasive invasive mitral valve repair by Dr. Priscille Kluver 09/22/2020.  His most recent 2D echocardiogram performed 12/06/2020 revealed normal LV systolic function with a well-functioning mitral valve and no evidence of regurgitation.  We will continue to follow this on an annual basis.

## 2021-02-23 NOTE — Assessment & Plan Note (Signed)
History of perioperative PAF on amiodarone in the past.  This was discontinued by Dr. Ladona Ridgel.  A Zio patch showed no arrhythmias.  He is on Eliquis oral anticoagulation.

## 2021-02-23 NOTE — Assessment & Plan Note (Signed)
History of essential hypertension a blood pressure measured today at 132/78.  He is on losartan.

## 2021-02-23 NOTE — Assessment & Plan Note (Signed)
History of hyperlipidemia on statin therapy with lipid profile followed by his PCP. 

## 2021-02-26 ENCOUNTER — Other Ambulatory Visit: Payer: Self-pay

## 2021-02-26 ENCOUNTER — Ambulatory Visit
Admission: RE | Admit: 2021-02-26 | Discharge: 2021-02-26 | Disposition: A | Discharge status: Home / Self Care | Payer: 59 | Source: Ambulatory Visit | Attending: Psychiatry | Admitting: Psychiatry

## 2021-02-26 DIAGNOSIS — G08 Intracranial and intraspinal phlebitis and thrombophlebitis: Secondary | ICD-10-CM

## 2021-03-01 ENCOUNTER — Telehealth: Payer: Self-pay | Admitting: Internal Medicine

## 2021-03-01 NOTE — Telephone Encounter (Signed)
The patient was calling to speak to Dr. Lubertha Basque nurse. His wife is scheduled to see Dr. Elberta Fortis as a new patient on 11/29. The patient's husband and patient would like to be seen by Dr. Ladona Ridgel instead. He was calling to talk to Dr. Lubertha Basque nurse about this.

## 2021-03-01 NOTE — Telephone Encounter (Signed)
Patient states he wants to ask Boneta Lucks some questions. He did not want to give any further information.

## 2021-03-02 NOTE — Telephone Encounter (Signed)
Pt was calling for his wife.  Sent to scheduler to contact wife to see if she wanted to change providers.  Per scheduling, Pt did not want to change her appointment.  Would like first available which had already been scheduled for her.  Await further needs.

## 2021-03-15 ENCOUNTER — Telehealth: Payer: Self-pay

## 2021-03-15 NOTE — Telephone Encounter (Signed)
   [facility] Pre-operative Risk Assessment    Patient Name: Chad Berry  DOB: 1940-09-02 MRN: [id number]   Request for surgical clearance:  What type of surgery is being performed  Dental cleaning,fillings,root canal   When is this surgery scheduled  TBD  What type of clearance is required   Both  Are there any medications that need to be held prior to surgery and how long  Aspirin and Eliquis  Practice name and name of physician performing surgery  [facility]     What is the office phone number  [phone]   7.   What is the office fax number  [phone]  8.   Anesthesia type Not listed   Neoma Laming 03/15/2021, 3:37 PM  _________________________________________________________________   (provider comments below)

## 2021-03-15 NOTE — Telephone Encounter (Signed)
Will route to PharmD for rec's re: SBE prophylaxis. Tereso Newcomer, PA-C    03/15/2021 5:03 PM

## 2021-03-17 MED ORDER — AMOXICILLIN 500 MG PO CAPS: ORAL_CAPSULE | ORAL | 3 refills | Status: AC

## 2021-03-17 NOTE — Telephone Encounter (Signed)
Recommend continuing Eliquis. Pt requires SBE prophylaxis due to hx of mitral valve repair with artificial Gore-tex neochords and ring annuloplasty. Please advise pt I have sent rx for amoxicillin to take 2g 30-60 minutes prior to dental work.

## 2021-03-17 NOTE — Telephone Encounter (Signed)
   Primary Cardiologist: Nanetta Batty, MD  Chart reviewed as part of pre-operative protocol coverage. Simple dental extractions are considered low risk procedures per guidelines and generally do not require any specific cardiac clearance. It is also generally accepted that for simple extractions and dental cleanings, there is no need to interrupt blood thinner therapy.   SBE prophylaxis is required for the patient. Recommend continuing Eliquis. Pt requires SBE prophylaxis due to hx of mitral valve repair with artificial Gore-tex neochords and ring annuloplasty. Please advise pt I have sent rx for amoxicillin to take 2g 30-60 minutes prior to dental work.  I will route this recommendation to the requesting party via Epic fax function and remove from pre-op pool.  Please call with questions.  Ronney Asters, NP 03/17/2021, 1:05 PM

## 2021-03-21 ENCOUNTER — Ambulatory Visit (HOSPITAL_COMMUNITY): Payer: 59 | Attending: Cardiovascular Disease

## 2021-03-21 ENCOUNTER — Other Ambulatory Visit: Payer: Self-pay

## 2021-03-21 DIAGNOSIS — I341 Nonrheumatic mitral (valve) prolapse: Secondary | ICD-10-CM | POA: Diagnosis not present

## 2021-03-21 DIAGNOSIS — I499 Cardiac arrhythmia, unspecified: Secondary | ICD-10-CM | POA: Diagnosis not present

## 2021-03-21 DIAGNOSIS — I10 Essential (primary) hypertension: Secondary | ICD-10-CM | POA: Insufficient documentation

## 2021-03-21 DIAGNOSIS — I340 Nonrheumatic mitral (valve) insufficiency: Secondary | ICD-10-CM | POA: Insufficient documentation

## 2021-03-21 LAB — ECHOCARDIOGRAM COMPLETE
Area-P 1/2: 2.66 cm2
MV VTI: 1.35 cm2
S' Lateral: 2.8 cm

## 2021-03-22 ENCOUNTER — Telehealth: Payer: Self-pay | Admitting: Cardiovascular Disease

## 2021-03-22 NOTE — Telephone Encounter (Signed)
Advised patient waiting Dr Allyson Sabal to review   1) Chad Berry is concerned that his HR is only getting up to 117 with cardiac rehab no matter how hard Chad Berry works, feels good during that time   2) When Chad Berry gets up and goes to bathroom will get short winded This only occurs for the first 30 seconds but if Chad Berry continues to exert himself it subside No fast rise in heart rate   Chad Berry states other than these 2 things Chad Berry is doing good Will forward to Dr Allyson Sabal for review

## 2021-03-22 NOTE — Telephone Encounter (Signed)
Follow Up:      Patient said he saw his Echo results on My Chart. He says he needs somebody to explain it to him.

## 2021-03-28 NOTE — Telephone Encounter (Signed)
I saw Chad Berry in the office 1 month ago and he was doing great s/p minimally invasive mV repair. 2D echo performed recently showed nl LV systolic FXN with out any MR, good post op result.    JJB    Left message to call back

## 2021-03-30 NOTE — Telephone Encounter (Signed)
Gave patient his Echo results. He discussed with me his cardiac rehab activities and that he feels good and tolerates his exercises.

## 2021-03-30 NOTE — Telephone Encounter (Signed)
Patient returning call to Triage Nurse about test results

## 2021-03-31 ENCOUNTER — Encounter: Payer: Self-pay | Admitting: Psychiatry

## 2021-03-31 ENCOUNTER — Other Ambulatory Visit: Payer: Self-pay | Admitting: Psychiatry

## 2021-03-31 MED ORDER — GABAPENTIN 300 MG PO CAPS: ORAL_CAPSULE | ORAL | 3 refills | Status: DC

## 2021-03-31 NOTE — Telephone Encounter (Signed)
Suggestions? Please advise  °

## 2021-04-16 ENCOUNTER — Other Ambulatory Visit: Payer: Self-pay | Admitting: Internal Medicine

## 2021-04-18 NOTE — Telephone Encounter (Signed)
Pt last saw Dr Allyson Sabal 02/23/21, last labs 12/27/20 Creat 1.47, age 80, weight 94.1kg, based on specified criteria pt is on appropriate dosage of Eliquis 5mg  BID for afib.  Will refill rx.

## 2021-05-16 ENCOUNTER — Telehealth: Payer: Self-pay | Admitting: Psychiatry

## 2021-05-16 DIAGNOSIS — G4452 New daily persistent headache (NDPH): Secondary | ICD-10-CM

## 2021-05-16 MED ORDER — UBRELVY 50 MG PO TABS: ORAL_TABLET | ORAL | 3 refills | Status: DC

## 2021-05-16 NOTE — Telephone Encounter (Signed)
Called patient and advised him of Dr Zannie Cove message and new gabapentin instructions, Rx for Ubrelvy. Advised him it will require a PA. He stated he has $5000 deductible to meet this year so will have to pay. I advised will still do PA. Answered his questions to his stated satisfaction, verbalized understanding, appreciation.  Bernita Raisin PA, key  [id number]. Your information has been sent to [facility].

## 2021-05-16 NOTE — Telephone Encounter (Signed)
Patient called who stated he saw PCP due to new urinary issues. PCP told him it's side effect of gabapentin. He stated headaches got better when he first started gabapentin. In last two nights his left temple and side of head have been painful causing him to be nauseated.  His worst time is from 5 pm on through the night, it's very painful when he sneezes or coughs. He's currently taking gabapentin 300 mg 3 x day. I informed him Dr Delena Bali is out of the office. I'll send to Dr Terrace Arabia and let him know her advice. Patient verbalized understanding, appreciation.

## 2021-05-16 NOTE — Telephone Encounter (Signed)
Chart reviewed, was seen by Dr. Delena Bali in October 2022 for headache, MRI of the brain in August 2022 showed no significant abnormalities.  MRA showed no large vessel disease, MRV of the brain was normal October 2022  On gabapentin 300 mg times a day, which has helped his headache, but concerning urinary issues,?,  Headache may be from 5 PM throughout the night  Please advise patient skip daytime dose of gabapentin, take 300 mg every night  Ubrelvy as needed for headache, limit the use to less than twice each week   Meds ordered this encounter  Medications   Ubrogepant (UBRELVY) 50 MG TABS    Sig: Take 1 tab at onset of migraine.  May repeat in 2 hrs, if needed.  Max dose: 2 tabs/day. This is a 30 day prescription.    Dispense:  10 tablet    Refill:  3

## 2021-05-16 NOTE — Telephone Encounter (Signed)
Called [facility] PA Dept, spoke with Em who stated it is pending pharmacy review.  [id number]

## 2021-05-16 NOTE — Telephone Encounter (Signed)
Called patient, LVM requesting call back. 

## 2021-05-16 NOTE — Telephone Encounter (Signed)
Pt having pain left temple, primarily at night. Have seen PCP, he was going to send a message to Dr. Delena Bali about the side effect prostate problems caused by  gabapentin (NEURONTIN) 300 MG capsule. Would like a call from the nurse.

## 2021-05-16 NOTE — Telephone Encounter (Signed)
[facility] (Sam) regarding pt's PA,if can call back. PA Department  [phone]

## 2021-05-17 ENCOUNTER — Encounter: Payer: Self-pay | Admitting: *Deleted

## 2021-05-17 NOTE — Telephone Encounter (Signed)
The request for coverage for Ubrelvy Tab 50mg , use as directed (10 per month), is denied. This decision is based on health plan criteria for Ubrelvy. This medicine is covered only if: You have a history of therapeutic failure (after at least 3 migraine episodes and a minimum of a 30-day trial), contraindication or intolerance to two of the following (name and date tried required): (A) Almotriptan (Axert). (B) Eletriptan (Relpax). (C) Frovatriptan (Frova). (D) Naratriptan (Amerge). (E) Rizatriptan (Maxalt/Maxalt MLT). (F) Sumatriptan (Imitrex). (G) Zolmitriptan (Zomig). Will inform patient. MD out of office today, will inform MD when she returns.

## 2021-05-18 ENCOUNTER — Other Ambulatory Visit: Payer: Self-pay | Admitting: Psychiatry

## 2021-05-18 MED ORDER — DULOXETINE HCL 30 MG PO CPEP: 30.0000 mg | ORAL_CAPSULE | Freq: Every day | ORAL | 3 refills | Status: DC

## 2021-05-18 NOTE — Telephone Encounter (Signed)
Called patient and LVM advising him of new Rx and to stop gabapentin. Left # for questions.

## 2021-05-18 NOTE — Telephone Encounter (Signed)
Patient called back with questions about VM re: cymbalta. I answered questions to his stated satisfaction. He will try cymbalta before trying ubrelvy to see if headaches are controled.  Patient verbalized understanding, appreciation.

## 2021-05-18 NOTE — Telephone Encounter (Signed)
If gabapentin is causing side effects we can switching to daily Cymbalta and see if he tolerates this better. I will send in a prescription to his pharmacy

## 2021-06-02 ENCOUNTER — Other Ambulatory Visit: Payer: Self-pay

## 2021-06-02 ENCOUNTER — Encounter: Payer: Self-pay | Admitting: Psychiatry

## 2021-06-02 ENCOUNTER — Ambulatory Visit (INDEPENDENT_AMBULATORY_CARE_PROVIDER_SITE_OTHER): Payer: 59 | Admitting: Psychiatry

## 2021-06-02 VITALS — BP 117/77 | HR 63 | Ht 70.0 in | Wt 209.0 lb

## 2021-06-02 DIAGNOSIS — G4452 New daily persistent headache (NDPH): Secondary | ICD-10-CM

## 2021-06-02 MED ORDER — ONDANSETRON 8 MG PO TBDP: 8.0000 mg | ORAL_TABLET | Freq: Three times a day (TID) | ORAL | 3 refills | Status: DC | PRN

## 2021-06-02 NOTE — Patient Instructions (Signed)
Continue Cymbalta 30 mg daily for headache prevention Take Zofran (ondansetron) as needed for nausea Can take Tylenol 1-2 days per week as needed for headaches

## 2021-06-02 NOTE — Progress Notes (Signed)
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 MRI/MRA brain were unremarkable.  At his last visit he was started on gabapentin 300 mg TID for headache prevention. MRV was ordered.  Interval History: Since his last visit he has had some improvement in his headaches. Gabapentin gave him urinary issues so he stopped it. He was started on Cymbalta 30 mg daily ~2 weeks ago. Since then his headaches are no longer constant. He will have intermittent headaches ~4 times per week, but these will last for about 30 minutes then resolve. Has developed some lightheadedness with Cymbalta.  Roselyn Meier was denied by insurance so he never picked it up.  MRV was unremarkable. Imaging personally reviewed today  Headache days per month: 16 Headache free days per month: 14  Current Headache Regimen: Preventative: Cymbalta 30 mg daily Abortive: Tylenol   Prior Therapies                                  Metoprolol - bradycardia losartan  Physical Exam:   Vital Signs: BP 117/77    Pulse 63    Ht 5\' 10"  (1.778 m)    Wt 209 lb (94.8 kg)    BMI 29.99 kg/m  GENERAL:  well appearing, in no acute distress, alert  SKIN:  Color, texture, turgor normal. No rashes or lesions HEAD:  Normocephalic/atraumatic. RESP: normal respiratory effort MSK:  No gross joint deformities.   NEUROLOGICAL: Mental Status: Alert, oriented to person, place and time, Follows commands, and Speech fluent and appropriate. Cranial Nerves: PERRL, face symmetric, no dysarthria, hearing grossly intact Motor: moves all extremities equally Gait: normal-based.  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 He has had improvement in his headaches since starting Cymbalta 2 weeks ago. Will continue this for now. Zofran prescribed for nausea when he gets a more severe headache.  PLAN: -Preventive: Continue Cymbalta 30 mg daily -Rescue: Start Zofran 8 mg PRN for nausea  Follow-up: 3 months  I spent a total of 19 minutes on the date of the service.  Discussed treatment options including preventive and acute medications. Discussed medication overuse headache and to limit use of acute treatments to no more than 2 days/week or 10 days/month. Discussed medication side effects, adverse reactions and drug interactions. Written educational materials and patient instructions outlining all of the above were given.  Genia Harold, MD 06/02/21 9:22 AM

## 2021-06-13 ENCOUNTER — Other Ambulatory Visit: Payer: Self-pay | Admitting: Psychiatry

## 2021-06-13 MED ORDER — DULOXETINE HCL 20 MG PO CPEP: 20.0000 mg | ORAL_CAPSULE | Freq: Every day | ORAL | 3 refills | Status: DC

## 2021-06-16 IMAGING — DX DG CHEST 1V PORT
1 series · 1 of 1 positions shown · non-contrast
Comparison: Portable exam 7707 hours compared to 09/23/2020

CLINICAL DATA: Chest tube, open heart surgery

EXAM:
PORTABLE CHEST 1 VIEW

[chest ap]
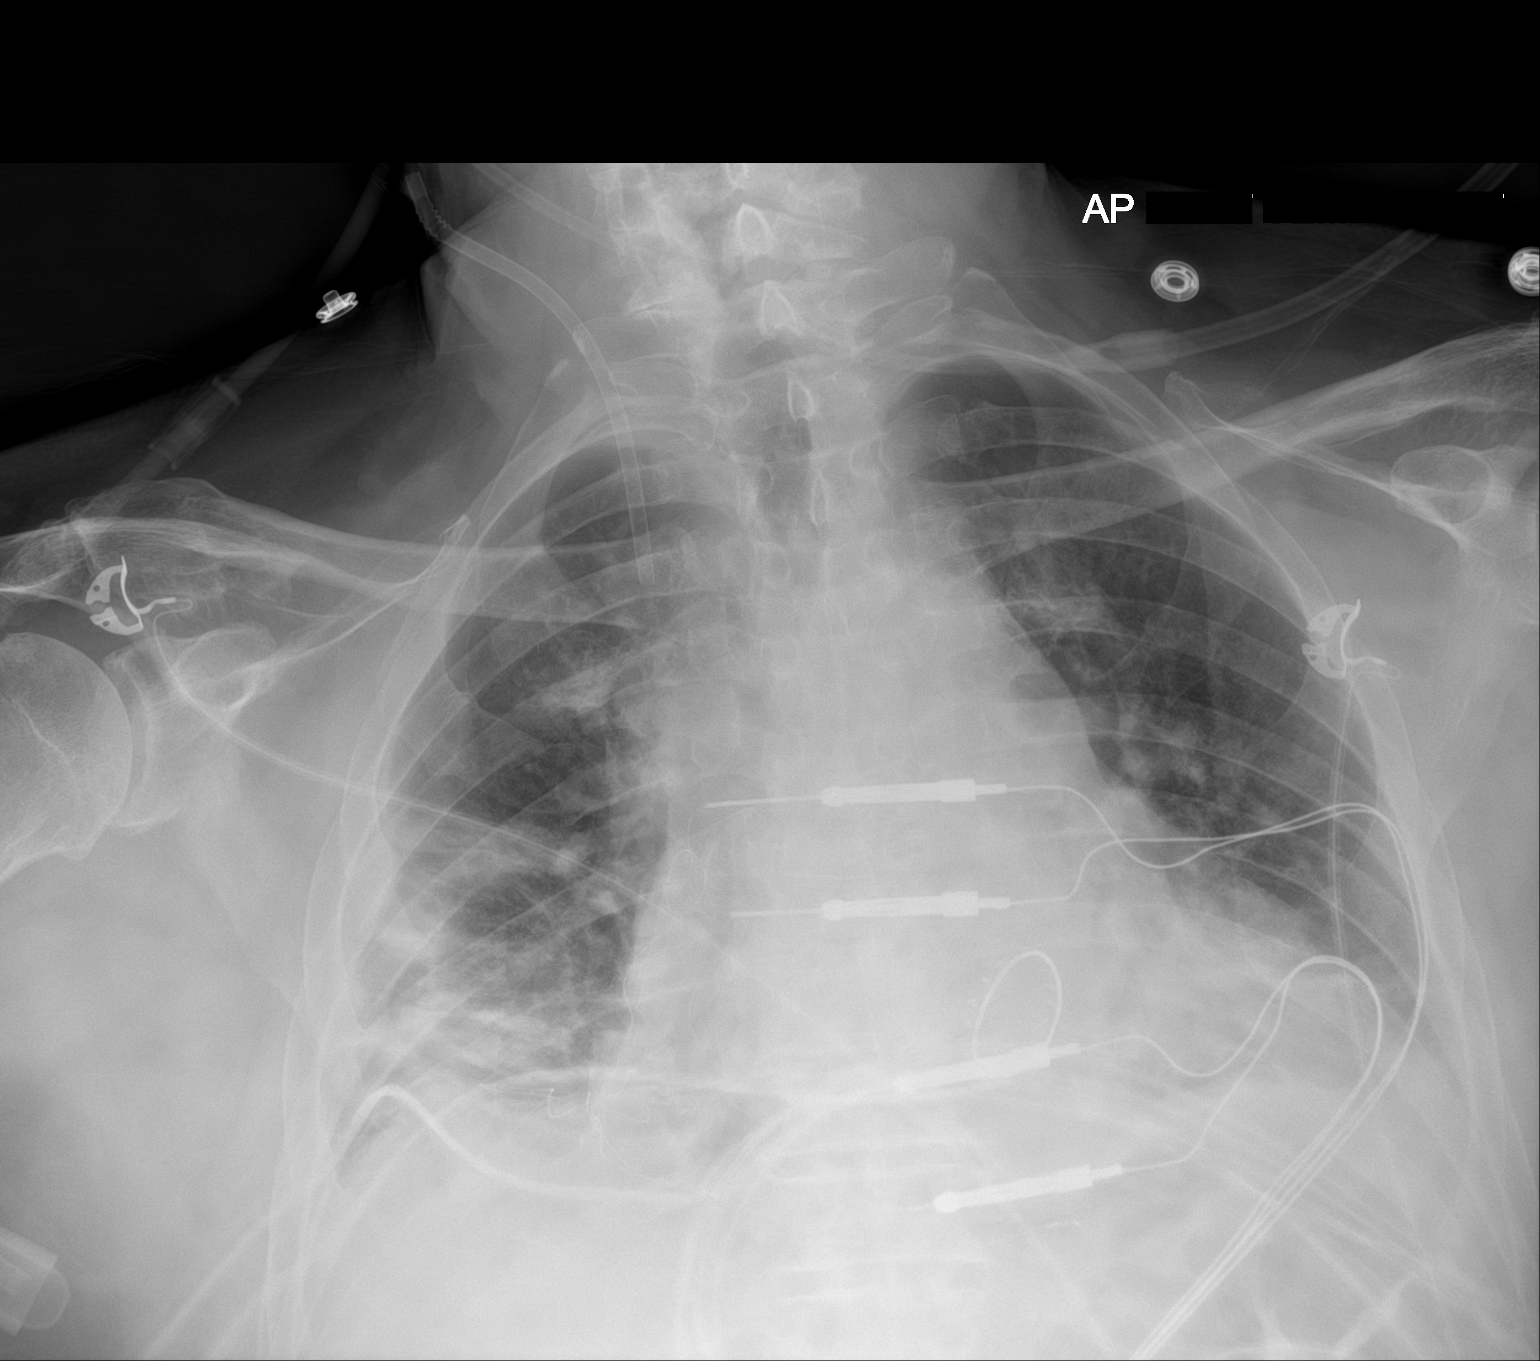

[1 of 1 positions shown; findings below may reference images not displayed]

FINDINGS: Interval removal of Swan-Ganz catheter.

RIGHT thoracostomy tube and RIGHT jugular line unchanged.

Enlargement of cardiac silhouette post MVR.

Epicardial pacing wires present.

Pulmonary vascular congestion.

Bibasilar atelectasis greater on RIGHT.

No infiltrate, pleural effusion, or pneumothorax.
IMPRESSION: Persistent bibasilar atelectasis greater on RIGHT.

## 2021-06-18 IMAGING — DX DG CHEST 1V PORT
1 series · 1 of 1 positions shown · non-contrast
Comparison: Chest x-ray 09/24/2020.

CLINICAL DATA: 79-year-old male status post mitral valve repair.

EXAM:
PORTABLE CHEST 1 VIEW

[chest ap]
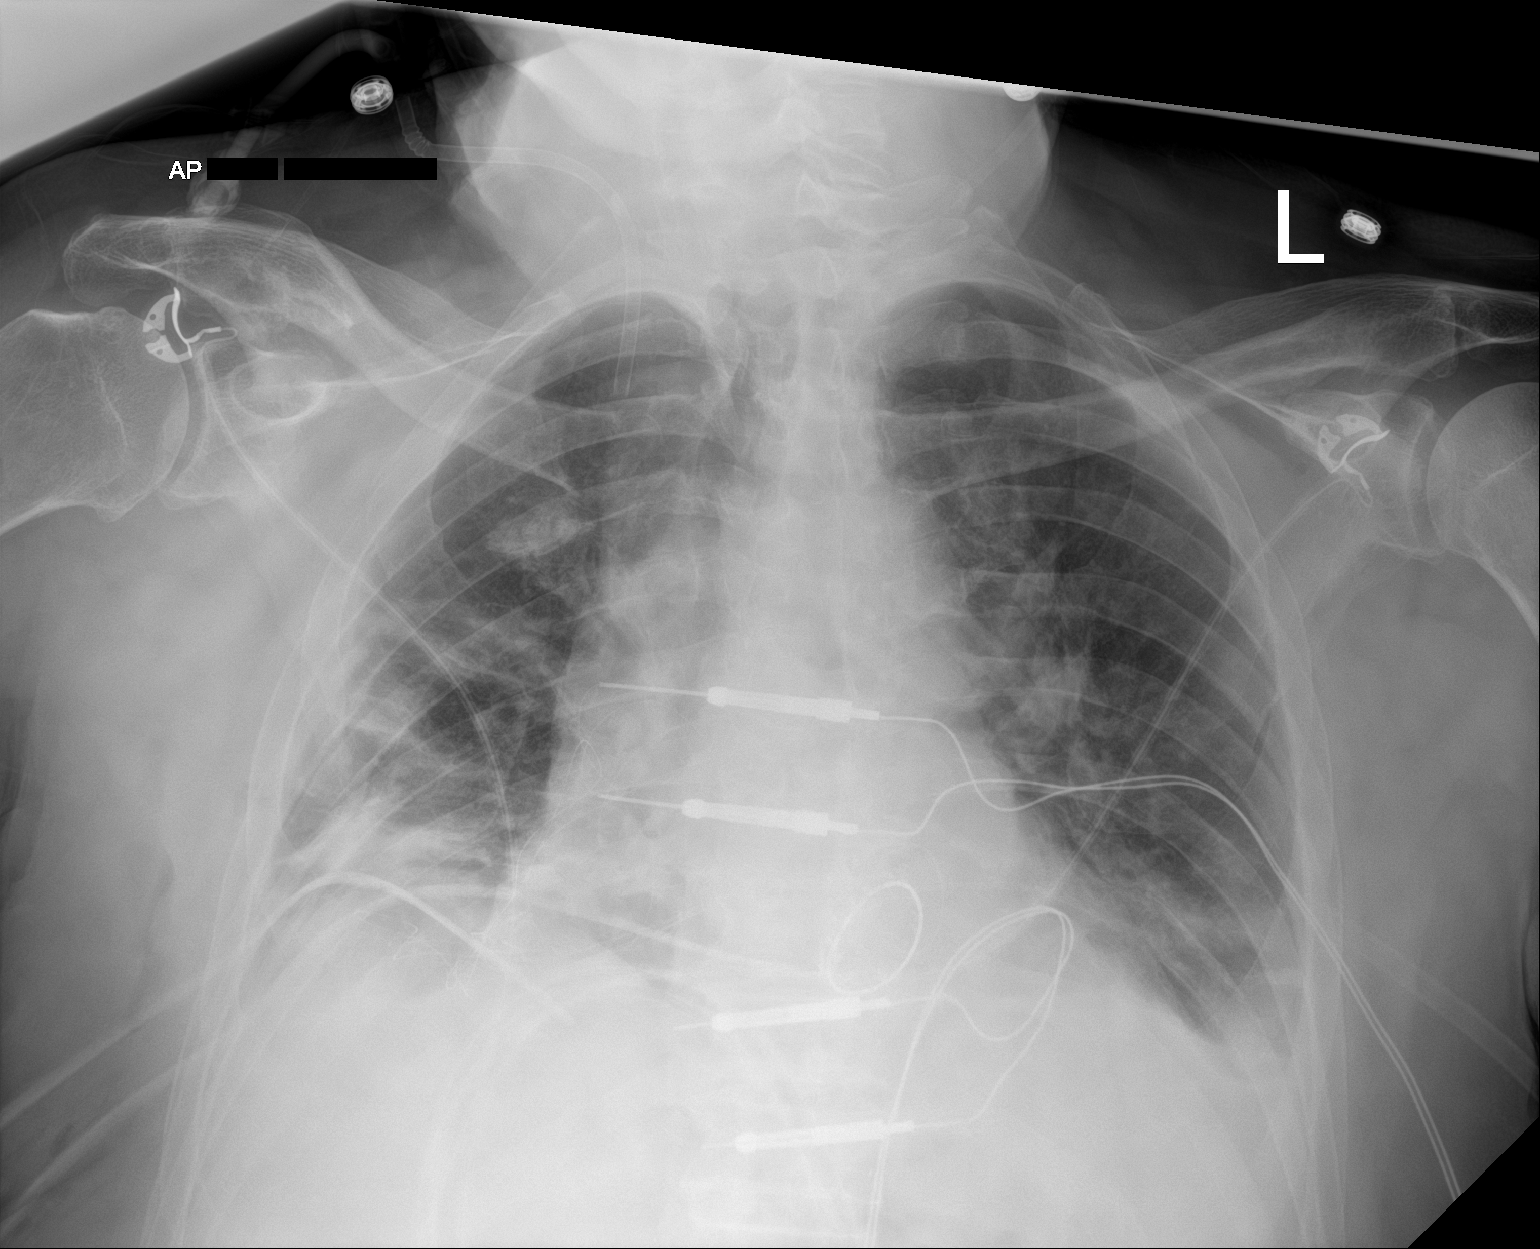

[1 of 1 positions shown; findings below may reference images not displayed]

FINDINGS: Right internal jugular Cordis in position with tip terminating in
the right internal jugular vein. Epicardial pacing wires are noted.
Lung volumes are low. Patchy opacities throughout the mid to lower
lungs bilaterally which likely reflect predominantly subsegmental
atelectasis, although underlying airspace consolidation is difficult
to entirely exclude. Small bilateral pleural effusions. No definite
pneumothorax. No definite pulmonary edema. Heart size is mildly
enlarged. Upper mediastinal contours are distorted by patient
positioning. Postoperative changes of mitral valve repair are noted.
IMPRESSION: 1. Postoperative changes and support apparatus, as above.
2. Low lung volumes with bibasilar areas of atelectasis and/or
consolidation and small bilateral pleural effusions.
3. Mild cardiomegaly.

## 2021-07-24 ENCOUNTER — Encounter: Payer: Self-pay | Admitting: Internal Medicine

## 2021-07-31 IMAGING — CT CT HEAD W/O CM
4 series · 16 of 47 positions shown, 18 images · non-contrast
Comparison: 10/22/2020

CLINICAL DATA: Anticoagulation, hemorrhage suspected

EXAM:
CT HEAD WITHOUT CONTRAST
TECHNIQUE: Contiguous axial images were obtained from the base of the skull
through the vertex without intravenous contrast.

[Series 2: head wo · axial · 0.40mm/px · z∈[-131,-11]mm · 7 of 32 slices shown, 9 images]
[im 4/32  brain]
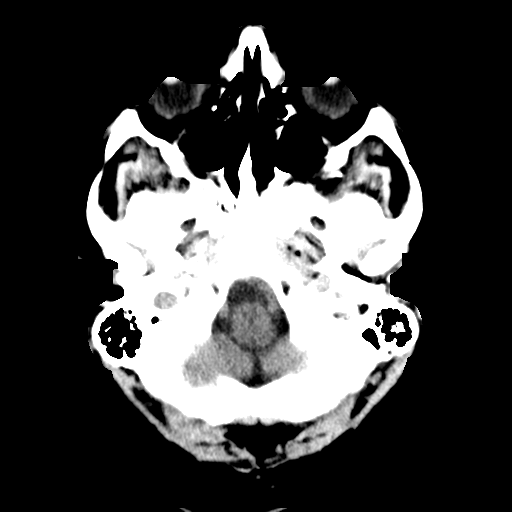
[im 4/32  bone]
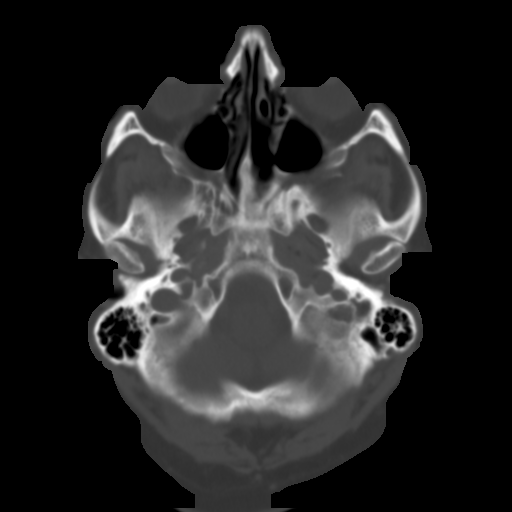
[im 8/32  brain]
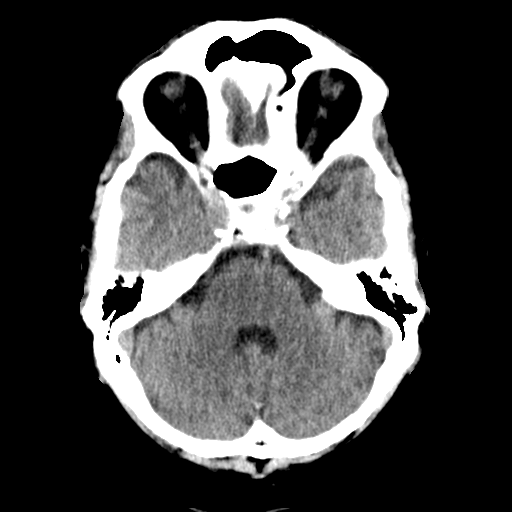
[im 12/32  brain]
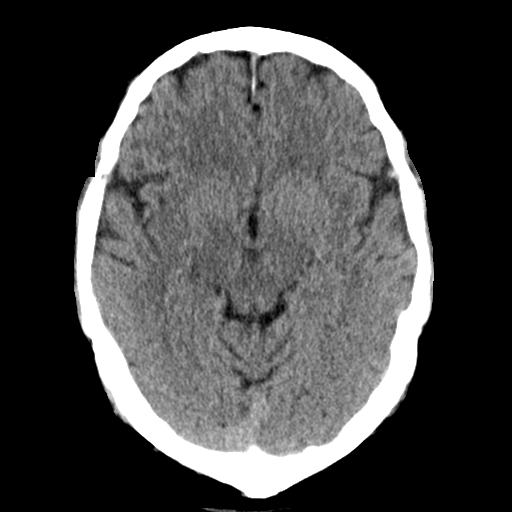
[im 16/32  brain]
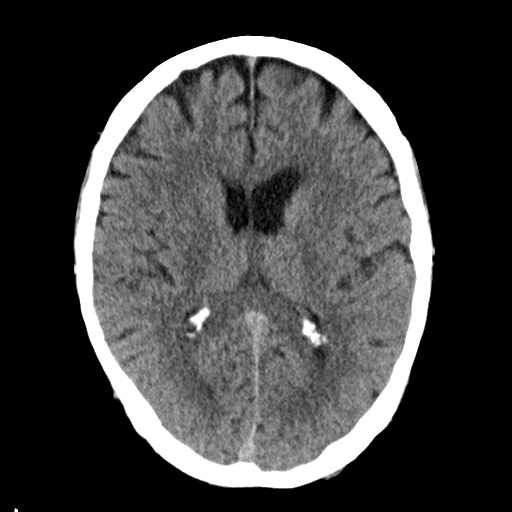
[im 20/32  brain]
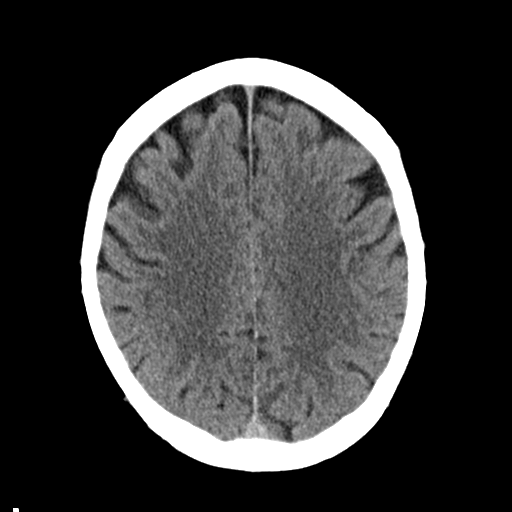
[im 20/32  bone]
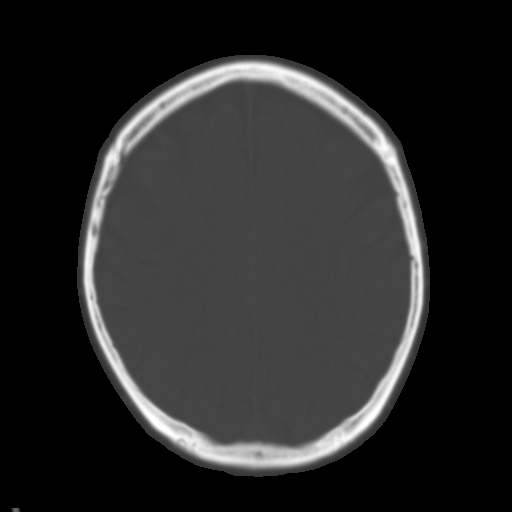
[im 24/32  brain]
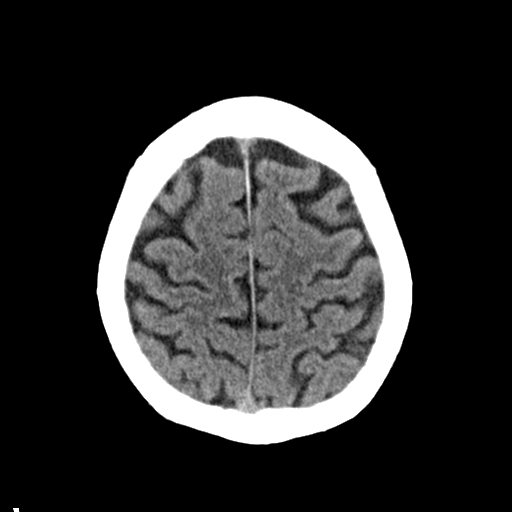
[im 28/32  brain]
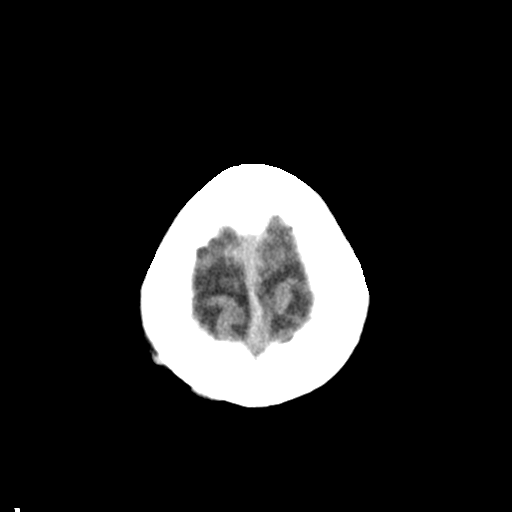

[Series 3: head bone · axial · 0.40mm/px · z∈[-132,-100]mm · 3 of 80 slices shown]
[im 8/80  bone]
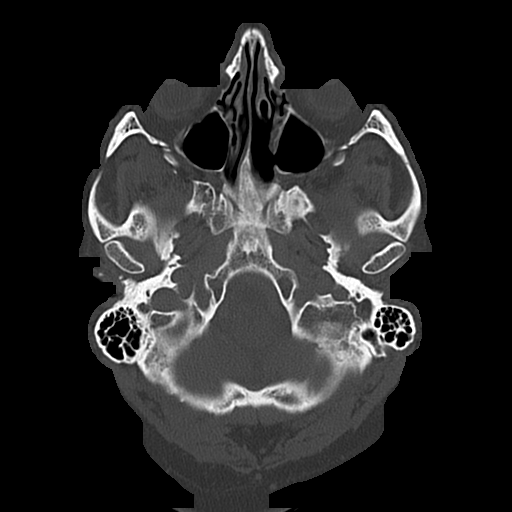
[im 16/80  bone]
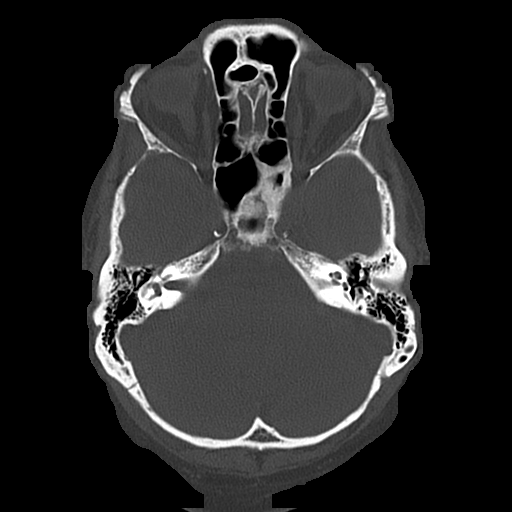
[im 24/80  bone]
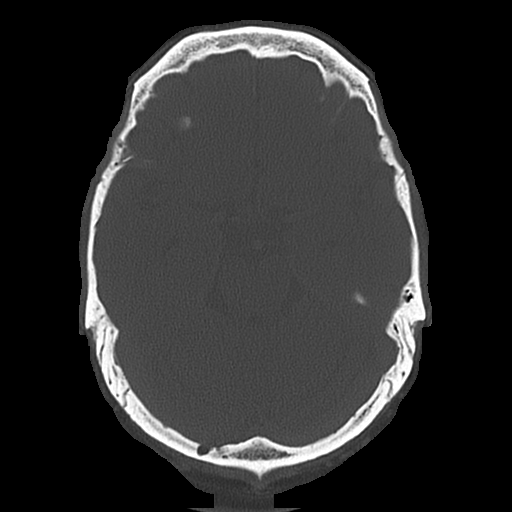

[Series 4: coronal soft · coronal · 0.30mm/px · 3 of 69 slices shown]
[im 23/69  brain]
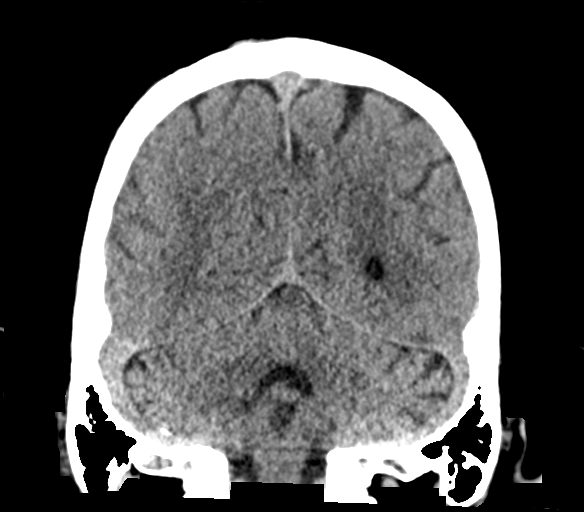
[im 31/69  brain]
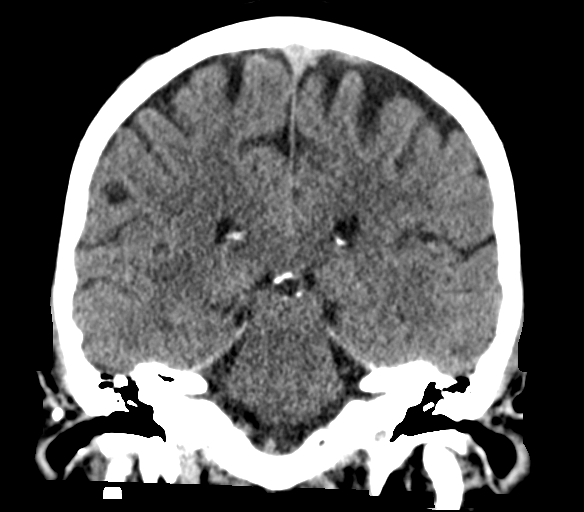
[im 38/69  brain]
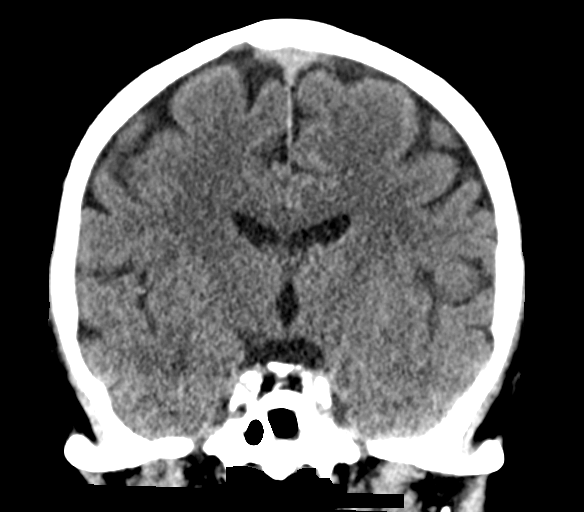

[Series 5: sagittal soft · sagittal · 0.30mm/px · 3 of 55 slices shown]
[im 19/55  brain]
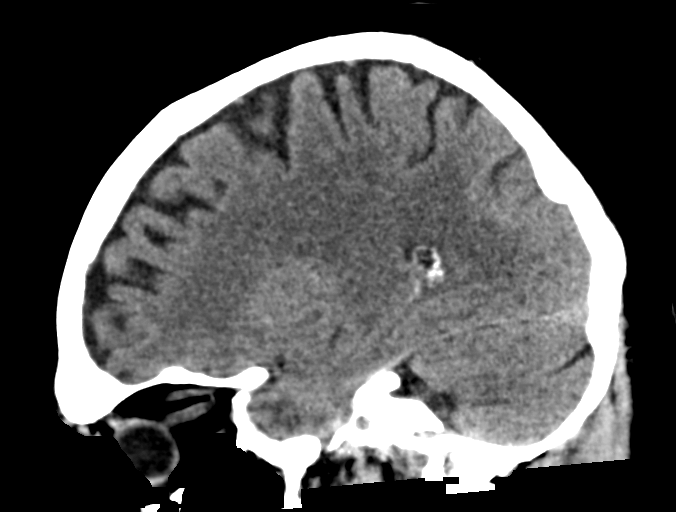
[im 28/55  brain]
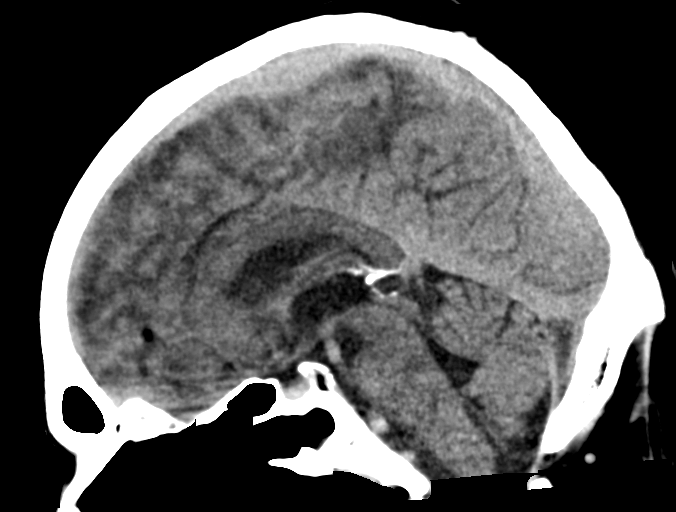
[im 37/55  brain]
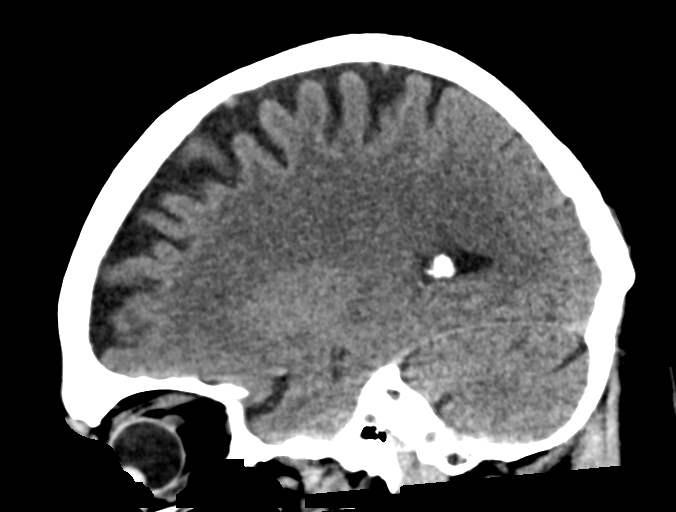

[16 of 47 positions shown; findings below may reference images not displayed]

FINDINGS: Brain: No evidence of acute infarction, hemorrhage, hydrocephalus,
extra-axial collection or mass lesion/mass effect.

Vascular: No hyperdense vessel or unexpected calcification.

Skull: Normal. Negative for fracture or focal lesion.

Sinuses/Orbits: No acute finding.

Other: None.
IMPRESSION: No acute intracranial pathology. No evidence of hemorrhage.

## 2021-08-19 ENCOUNTER — Encounter: Payer: Self-pay | Admitting: Cardiovascular Disease

## 2021-08-19 ENCOUNTER — Other Ambulatory Visit: Payer: Self-pay | Admitting: Family Medicine

## 2021-08-19 ENCOUNTER — Ambulatory Visit
Admission: RE | Admit: 2021-08-19 | Discharge: 2021-08-19 | Disposition: A | Discharge status: Home / Self Care | Payer: 59 | Source: Ambulatory Visit | Attending: Family Medicine | Admitting: Family Medicine

## 2021-08-19 DIAGNOSIS — R0602 Shortness of breath: Secondary | ICD-10-CM

## 2021-08-19 DIAGNOSIS — Z9889 Other specified postprocedural states: Secondary | ICD-10-CM

## 2021-08-19 DIAGNOSIS — I10 Essential (primary) hypertension: Secondary | ICD-10-CM

## 2021-08-22 NOTE — Telephone Encounter (Signed)
Runell Gess, MD  Darene Lamer, LPN 3 days ago  ? ?I think that he's been on Eliquis long enough. If he's more SOM prob needs a repeat 2D and F/U with an APP   ? ?Spoke with pt regarding Dr. Hazle Coca recommendations. Pt would like to stay on eliquis for now and have a more in depth discussion about this with Dr. Allyson Sabal at follow up visit. Orders placed for echo and will re-schedule follow up visit to be after echocardiogram. Pt verbalizes understanding.  ?

## 2021-08-23 ENCOUNTER — Ambulatory Visit: Payer: 59 | Admitting: Cardiovascular Disease

## 2021-08-26 ENCOUNTER — Encounter (HOSPITAL_COMMUNITY): Payer: Self-pay | Admitting: Cardiovascular Disease

## 2021-08-31 ENCOUNTER — Encounter (HOSPITAL_COMMUNITY): Payer: Self-pay

## 2021-08-31 NOTE — Telephone Encounter (Signed)
Good Morning!  ? ?Please see the attempts made to call to set up echocardiogram. I also called pt again this am per your request and had to leave another message:  ?08/31/21 [facility] to schedule @ 10:12 per staff messge from RN /[facility]W  ?08/26/21 MAILED LETTER [facility]  ?08/26/21 Called and VMis full@ 11:41 x3/LBW  ?08/24/21@1450  Unable to LVM mailbox is full. EVD  ?08/23/21 [facility] to schedule @ 9:01/[facility]  ? ?I have sent appt for echocardiogram thru My Chart. :)  ?

## 2021-08-31 NOTE — Telephone Encounter (Signed)
Below message sent to scheduler on 08/22/21. Patient following up as he has not heard back about appointment(s).  ?Routed to Edmon Crape and [facility] scheduling team  ? ?August 22, 2021 ?Beatrix Fetters, RN ?to Gwendel Hanson   ?KG ?   3:28 PM ?Aggie Moats, can you schedule pt for echo and re-schedule follow up visit for after echo is done please?  ? ?Thank you!  ?

## 2021-09-09 ENCOUNTER — Ambulatory Visit (HOSPITAL_COMMUNITY): Payer: 59 | Attending: Cardiology

## 2021-09-09 DIAGNOSIS — I10 Essential (primary) hypertension: Secondary | ICD-10-CM | POA: Diagnosis not present

## 2021-09-09 DIAGNOSIS — Z9889 Other specified postprocedural states: Secondary | ICD-10-CM | POA: Diagnosis not present

## 2021-09-09 LAB — ECHOCARDIOGRAM COMPLETE
Area-P 1/2: 2.85 cm2
MV VTI: 1.87 cm2
S' Lateral: 2.3 cm

## 2021-09-14 ENCOUNTER — Other Ambulatory Visit: Payer: Self-pay | Admitting: Family Medicine

## 2021-09-14 DIAGNOSIS — R0602 Shortness of breath: Secondary | ICD-10-CM

## 2021-09-16 ENCOUNTER — Ambulatory Visit
Admission: RE | Admit: 2021-09-16 | Discharge: 2021-09-16 | Disposition: A | Discharge status: Home / Self Care | Payer: 59 | Source: Ambulatory Visit | Attending: Family Medicine | Admitting: Family Medicine

## 2021-09-16 DIAGNOSIS — R0602 Shortness of breath: Secondary | ICD-10-CM

## 2021-09-27 NOTE — Telephone Encounter (Signed)
Spoke with pt regarding recent [employer]. Per Dr. Gwenlyn Found, pt is ok to come off eliquis and at this point pt does want to stop medication. Pt will monitor for signs of blood clots. Pt scheduled follow up appointment to see Dr. Gwenlyn Found in August. Pt verbalizes understanding.

## 2021-10-19 ENCOUNTER — Other Ambulatory Visit: Payer: Self-pay | Admitting: Family Medicine

## 2021-10-19 ENCOUNTER — Ambulatory Visit
Admission: RE | Admit: 2021-10-19 | Discharge: 2021-10-19 | Disposition: A | Discharge status: Home / Self Care | Payer: 59 | Source: Ambulatory Visit | Attending: Family Medicine | Admitting: Family Medicine

## 2021-10-19 DIAGNOSIS — R0602 Shortness of breath: Secondary | ICD-10-CM

## 2021-11-30 ENCOUNTER — Encounter: Payer: Self-pay | Admitting: Cardiovascular Disease

## 2021-11-30 ENCOUNTER — Ambulatory Visit (INDEPENDENT_AMBULATORY_CARE_PROVIDER_SITE_OTHER): Payer: 59 | Admitting: Cardiovascular Disease

## 2021-11-30 VITALS — BP 118/60 | HR 68 | Ht 71.0 in | Wt 219.0 lb

## 2021-11-30 DIAGNOSIS — I10 Essential (primary) hypertension: Secondary | ICD-10-CM

## 2021-11-30 DIAGNOSIS — I82411 Acute embolism and thrombosis of right femoral vein: Secondary | ICD-10-CM | POA: Diagnosis not present

## 2021-11-30 DIAGNOSIS — Z9889 Other specified postprocedural states: Secondary | ICD-10-CM

## 2021-11-30 DIAGNOSIS — I340 Nonrheumatic mitral (valve) insufficiency: Secondary | ICD-10-CM

## 2021-11-30 NOTE — Assessment & Plan Note (Signed)
History of severe mitral regurgitation status post minimally invasive mitral valve repair by Dr. Priscille Kluver 09/22/2020.  His most recent 2D echo performed 09/09/2021 revealed normal LV systolic function, mildly elevated right heart pressures with a prosthetic annuloplasty ring and no evidence of mitral regurgitation.  We will continue to follow him on an annual basis.

## 2021-11-30 NOTE — Patient Instructions (Signed)
Medication Instructions:  No Changes In Medications at this time.  *If you need a refill on your cardiac medications before your next appointment, please call your pharmacy*  Lab Work: None Ordered At This Time.  If you have labs (blood work) drawn today and your tests are completely normal, you will receive your results only by: MyChart Message (if you have MyChart) OR A paper copy in the mail If you have any lab test that is abnormal or we need to change your treatment, we will call you to review the results.  Testing/Procedures: Your physician has requested that you have an echocardiogram IN MAY 2024. Echocardiography is a painless test that uses sound waves to create images of your heart. It provides your doctor with information about the size and shape of your heart and how well your heart's chambers and valves are working. You may receive an ultrasound enhancing agent through an IV if needed to better visualize your heart during the echo.This procedure takes approximately one hour. There are no restrictions for this procedure. This will take place at the [address], [address].   Follow-Up: At [facility], you and your health needs are our priority.  As part of our continuing mission to provide you with exceptional heart care, we have created designated Provider Care Teams.  These Care Teams include your primary Cardiologist (physician) and Advanced Practice Providers (APPs -  Physician Assistants and Nurse Practitioners) who all work together to provide you with the care you need, when you need it.  Your next appointment:   6 month(s) WITH APP   AND THEN 12 MONTHS with  The format for your next appointment:   In Person  Provider:   Nanetta Batty, MD {

## 2021-11-30 NOTE — Assessment & Plan Note (Signed)
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  Ultimately he was placed on Eliquis oral anticoagulation.  Subsequent venous duplex showed resolution of his DVT and his Eliquis was discontinued.

## 2021-11-30 NOTE — Progress Notes (Signed)
11/30/2021 Chad Berry   1940/06/20  [id number]  Primary Physician Mosetta Putt, MD Primary Cardiologist: Runell Gess MD Nicholes Calamity, [place]  HPI:  Chad Berry is a 81 y.o.  moderately overweight, married Caucasian male, father of 2, grandfather to 2 grandchildren whose son Chad Berry is also a patient of mine, as is his wife. I last saw him 02/23/2021. He was worked up for palpitations and had an echo that showed severe MR with mild mitral valve prolapse and mild LV size and function. His other problems include hypertension and hyperlipidemia. He lives an active lifestyle and is actually heading out to [place] to go coyote hunting for a week. He has changed his diet and has lost 15 pounds. He has cut out caffeine and alcohol. He has had 2 negative sleep studies. He is otherwise asymptomatic. Since I saw him several years ago he has seen Tereso Newcomer in the office for evaluation of palpitations. A 2-D echo revealed normal LV function with holosystolic mitral valve prolapse, mild MR and moderate TR. A 48 hour Holter monitor showed PVCs, short runs of nonsustained ventricular tachycardia and short runs of SVT. Since stopping his diuretic his palpitations have improved.   I did refer him to Dr. Ladona Ridgel who recommended conservative therapy noting he had PACs and PVCs.  He recommended avoidance of caffeine and alcohol.  He is referred back by Dr. Duaine Dredge for follow-up of his mitral regurgitation because of a murmur which he thought was louder than he had remembered.  Repeat 2D echo performed 11/14/2017 revealed moderate to severe MR with posterior leaflet prolapse, normal LV size and function.  He is completely asymptomatic.  He purchased his ZR1 Corvette and recently rode [place] reaching speeds above 150 mph.   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    He does complain of increasing dyspnea over the last  6 to 12 months.  He had a transesophageal echo performed by Dr. Bjorn Pippin 05/04/2020 revealing a flail P2 segment with severe MR.  He wishes to proceed with mini mitral repair with Dr. Cornelius Moras.  I performed right and left heart cath revealing normal coronary arteries and severe MR.  He underwent mitral valve repair minimally invasively by Dr. Cornelius Moras 09/22/2020 and was discharged several days later.  He was readmitted 10/22/2020 with chest pain was found to have a DVT on the right with a pulmonary embolus.  His Coumadin was subtherapeutic which she was originally begun on because of perioperative PAF and he was transitioned to Eliquis oral anticoagulation.  He is currently participating cardiac rehab.  I referred him to Dr. Ladona Ridgel because of his PAF.  A Zio patch did not show any recurrent arrhythmias.  He remains on Eliquis.  He is still participating cardiac rehab.  He does 40 minutes on a bicycle, treadmill and stair stepper without symptoms.  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  He is very active and says that his energy level is back up to his preoperative level.  A follow-up 2D echo performed 12/06/2020 revealed normal LV systolic function with a well-functioning mitral valve and no regurgitation.  Lower extremity venous Doppler studies recently performed 02/01/2021 showed resolution of his right lower extremity DVT.  Since I saw him 10 months ago he was very active exercising on his treadmill, stair stepper" bicycle for 15 minutes at a time.  He did however get COVID over Christmas and since that time is noticed dyspnea on exertion.  His most recent 2D echo performed 09/09/2021 revealed normal LV function with no evidence of MR and mild elevation in RV pressures.  He was complaining of some arthralgias and saw his PCP who thought it might be statin intolerance.  After discontinuing his statin his symptoms resolved.   Current Meds  Medication Sig   amoxicillin (AMOXIL) 500 MG capsule Take 4 capsules  (2,000mg ) by mouth 30-60 minutes prior to dental work or cleaning.   Ascorbic Acid (VITAMIN C) 1000 MG tablet Take 1,000 mg by mouth daily.   Cholecalciferol (VITAMIN D) 50 MCG (2000 UT) tablet Take 2,000 Units by mouth daily.   levothyroxine (SYNTHROID) 75 MCG tablet Take 75 mcg by mouth at bedtime.   losartan (COZAAR) 50 MG tablet Take 2 tablets (100 mg total) by mouth daily. (Patient taking differently: Take 50 mg by mouth daily.)   vitamin B-12 (CYANOCOBALAMIN) 100 MCG tablet Take 100 mcg by mouth daily.   [DISCONTINUED] DULoxetine (CYMBALTA) 20 MG capsule Take 1 capsule (20 mg total) by mouth daily.   [DISCONTINUED] ezetimibe (ZETIA) 10 MG tablet Take 10 mg by mouth at bedtime.   [DISCONTINUED] ondansetron (ZOFRAN-ODT) 8 MG disintegrating tablet Take 1 tablet (8 mg total) by mouth every 8 (eight) hours as needed for nausea or vomiting.   [DISCONTINUED] rosuvastatin (CRESTOR) 40 MG tablet Take 40 mg by mouth at bedtime.   [DISCONTINUED] Ubrogepant (UBRELVY) 50 MG TABS Take 1 tab at onset of migraine.  May repeat in 2 hrs, if needed.  Max dose: 2 tabs/day. This is a 30 day prescription.     Allergies  Allergen Reactions   Contrast Media [Iodinated Contrast Media] Hives, Nausea And Vomiting and Cough    Pt will need a 13 hour prep before any contrast per Dr. Paulina Fusi (05/14/19)   Hctz [Hydrochlorothiazide] Other (See Comments)    palpitations   Metoprolol Succinate [Metoprolol] Other (See Comments)    Bradycardia      Social History   Socioeconomic History   Marital status: Married    Spouse name: Not on file   Number of children: Not on file   Years of education: Not on file   Highest education level: Not on file  Occupational History   Not on file  Tobacco Use   Smoking status: Former   Smokeless tobacco: Former    Types: Associate Professor Use: Never used  Substance and Sexual Activity   Alcohol use: Yes    Alcohol/week: 0.0 standard drinks of alcohol     Comment: occasionally   Drug use: No   Sexual activity: Not on file  Other Topics Concern   Not on file  Social History Narrative   Married, owner of Facilities manager company   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  Cardiovascular: negative for chest pain, dyspnea on exertion, edema, orthopnea, palpitations, paroxysmal nocturnal dyspnea or shortness of breath Dermatological: negative for rash Respiratory: negative for  cough or wheezing Urologic: negative for hematuria Abdominal: negative for nausea, vomiting, diarrhea, bright red blood per rectum, melena, or hematemesis Neurologic: negative for visual changes, syncope, or dizziness All other systems reviewed and are otherwise negative except as noted above.    Blood pressure 118/60, pulse 68, height 5\' 11"  (1.803 m), weight 219 lb (99.3 kg).  General appearance: alert and no distress Neck: no adenopathy, no carotid bruit, no JVD, supple, symmetrical, trachea midline, and thyroid not enlarged, symmetric, no tenderness/mass/nodules Lungs: clear to auscultation bilaterally Heart: regular rate and rhythm, S1, S2 normal, no murmur, click, rub or gallop Extremities: extremities normal, atraumatic, no cyanosis or edema Pulses: 2+ and symmetric Skin: Skin color, texture, turgor normal. No rashes or lesions Neurologic: Grossly normal  EKG sinus rhythm at 68 with poor R wave progression and nonspecific ST and T wave changes.  I personally reviewed this EKG.  ASSESSMENT AND PLAN:   Essential hypertension History of essential hypertension  a blood pressure measured today at 118/60.  He is on losartan.  Hyperlipidemia History of hyperlipidemia currently not on statin therapy because of statin intolerance followed by his PCP.  He did have normal coronary arteries at the time of right and left heart cath prior to his mitral valve repair.  He may be a candidate for a PCSK9.  Mitral regurgitation History of severe mitral regurgitation status post minimally invasive mitral valve repair by Dr. 09/22/2020.  His most recent 2D echo performed 09/09/2021 revealed normal LV systolic function, mildly elevated right heart pressures with a prosthetic annuloplasty ring and no evidence of mitral regurgitation.  We will continue to follow him on an annual basis.  DVT (deep venous thrombosis) (HCC) History of DVT and pulmonary embolus initially on Coumadin but he was subtherapeutic.  Ultimately he was placed on Eliquis oral anticoagulation.  Subsequent venous duplex showed resolution of his DVT and his Eliquis was discontinued.     11/09/2021 MD FACP,FACC,FAHA, [facility] 11/30/2021 9:29 AM

## 2021-11-30 NOTE — Assessment & Plan Note (Signed)
History of essential hypertension a blood pressure measured today at 118/60.  He is on losartan.

## 2021-11-30 NOTE — Assessment & Plan Note (Signed)
History of hyperlipidemia currently not on statin therapy because of statin intolerance followed by his PCP.  He did have normal coronary arteries at the time of right and left heart cath prior to his mitral valve repair.  He may be a candidate for a PCSK9.

## 2022-01-13 ENCOUNTER — Telehealth: Payer: Self-pay | Admitting: Cardiovascular Disease

## 2022-01-13 NOTE — Telephone Encounter (Signed)
Hey can you take a look at the labs- they were sent into media tab.   Thank you!

## 2022-01-13 NOTE — Telephone Encounter (Signed)
Caller stated she is following up on labs sent on 12/19/21 for Dr. Hazle Coca review.  Caller requested the patient be called regarding next steps.

## 2022-01-16 ENCOUNTER — Other Ambulatory Visit: Payer: Self-pay

## 2022-01-16 DIAGNOSIS — E782 Mixed hyperlipidemia: Secondary | ICD-10-CM

## 2022-01-16 DIAGNOSIS — E785 Hyperlipidemia, unspecified: Secondary | ICD-10-CM

## 2022-01-16 NOTE — Telephone Encounter (Signed)
Called patient, made aware of results from MD.   Will place referral to for lipid clinic.

## 2022-01-30 ENCOUNTER — Ambulatory Visit (INDEPENDENT_AMBULATORY_CARE_PROVIDER_SITE_OTHER): Payer: 59 | Admitting: Pulmonary Disease

## 2022-01-30 ENCOUNTER — Encounter: Payer: Self-pay | Admitting: Pulmonary Disease

## 2022-01-30 ENCOUNTER — Ambulatory Visit: Payer: 59

## 2022-01-30 VITALS — BP 130/90 | HR 59 | Ht 70.0 in | Wt 223.0 lb

## 2022-01-30 DIAGNOSIS — R0609 Other forms of dyspnea: Secondary | ICD-10-CM | POA: Diagnosis not present

## 2022-01-30 NOTE — Addendum Note (Signed)
Addended by: Alvin Critchley on: 01/30/2022 02:42 PM   Modules accepted: Orders

## 2022-01-30 NOTE — Patient Instructions (Addendum)
X amb sat X schedule PFTs X VQ scan

## 2022-01-30 NOTE — Assessment & Plan Note (Signed)
No clear cause identified.  His history does not sound like asthma, he does not report wheezing or any seasonal variation.  He has a remote history of minimal smoking so doubt COPD.  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 He has been off blood thinner for a few months after his episode of VTE and we will repeat VQ scan to make sure there is no CTEPH, ideally I would prefer CT angiogram but he is allergic to dye. CT chest without contrast shows minimal scarring, I did not feel this is related to COVID. If VQ scan is negative, I will encourage him to get back with cardiac rehab and improve his conditioning

## 2022-01-30 NOTE — Addendum Note (Signed)
Addended by: Alvin Critchley on: 01/30/2022 02:44 PM   Modules accepted: Orders

## 2022-01-30 NOTE — Progress Notes (Signed)
Subjective:    Patient ID: Chad Berry, male    DOB: November 10, 1940, 81 y.o.   MRN: [id number]  HPI  81 year old remote smoker presents for evaluation of shortness of breath on exertion for 6 months. He underwent mitral valve repair 08/2020, postop course was complicated by atrial fibrillation and DVT/PE.  His Coumadin dose was subtherapeutic and he was placed on Eliquis which he took until 06/2021.  He recovered well with cardiac rehab and was able to walk 45 minutes on a treadmill.  He had COVID infection in December 2022 and states that it has been downhill since then, his breathing is gotten worse.  He can walk for about an hour at his own pace on level ground but if he just gets up and going, he feels short of breath initially  He reports a chest cold for 1 week and received a Z-Pak course and this is improved but his head hurts when he coughs and when he bends over.  He wonders if there was some damage related to heart-lung machine He had several MRIs done last year which were negative.  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 Roxy Manns 09/22/2020  Significant tests/ events reviewed  10/2020 VQ scan high probability Echo 08/2021 normal LVEF, RVSP 40  CT chest without contrast 08/2021 right upper lobe scarring  Past Medical History:  Diagnosis Date  . Aortic atherosclerosis ([place])   . Bradycardia    while on BB with HR to the 40s  . Carotid stenosis    CAROTID DOPPLER, 12/07/2011 - Mild arthrosclerotic changes, no high-grade stenosis  . Cataract    rt. eye  . Clostridium difficile infection 05/2019  . Diverticulitis   . Heart murmur   . History of echocardiogram    Echo 9/16:  EF 60-65%, no RWMA, Gr 1 DD, holosystolic MVP of posterior leaflet,  mild MR, LA upper limits of normal, normal RVF, mod TR, PASP 50 mmHg  . Hypercholesteremia   . Hyperlipidemia   . Hypertension   . Hypothyroidism   . MVP (mitral valve prolapse)    a. Echo 12/07/2011 - EF-65-70%, severe mitral regurgitation, moderate-severe tricuspid regurgitation, moderate pulmonary HTN, moderate pulmonic regurgitation;  b. 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 Prolapsed internal hemorrhoids, grade 3 02/14/2017  . PVC (premature ventricular contraction)   . Rapid palpitations    event monitor with short bursts of SVT  . Renal cyst   . 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 Testicular hypofunction   . Vitamin D deficiency    Past Surgical History:  Procedure Laterality Date  . COLONOSCOPY    . ETHMOIDECTOMY Left 08/13/2020   Procedure: SPENOIDECTOMY AND ETHMOIDECTOMY;  Surgeon: Rozetta Nunnery, MD;  Location: [facility];  Service: ENT;  Laterality: Left;  . MITRAL VALVE REPAIR Right 09/22/2020   Procedure: MINIMALLY INVASIVE MITRAL VALVE REPAIR (MVR) USING MEDTRONIC SIMUFORM SEMI-RIGID ANNULOPLASTY RING SIZE 28MM ON PUMP;  Surgeon: Roxy Manns,  Valentina Gu, MD;  Location: [facility];  Service: Open Heart Surgery;  Laterality: Right;  . RIGHT/LEFT HEART CATH AND CORONARY ANGIOGRAPHY N/A 08/30/2020   Procedure: RIGHT/LEFT HEART CATH AND CORONARY ANGIOGRAPHY;  Surgeon: Lorretta Harp, MD;  Location: [facility];  Service: Cardiovascular;  Laterality: N/A;  . SIGMOIDOSCOPY  02/14/2017  . SINUS ENDO WITH FUSION Left 08/13/2020   Procedure: SINUS ENDO WITH FUSION;  Surgeon: Rozetta Nunnery, MD;  Location: [facility];  Service: ENT;  Laterality: Left;  . TEE WITHOUT  CARDIOVERSION N/A 05/04/2020   Procedure: TRANSESOPHAGEAL ECHOCARDIOGRAM (TEE);  Surgeon: Donato Heinz, MD;  Location: [facility];  Service: Cardiovascular;  Laterality: N/A;  . TEE WITHOUT CARDIOVERSION N/A 09/22/2020   Procedure: TRANSESOPHAGEAL ECHOCARDIOGRAM (TEE);  Surgeon: Rexene Alberts, MD;  Location: [facility];  Service: Open Heart Surgery;  Laterality: N/A;    Allergies  Allergen Reactions  . Contrast Media [Iodinated Contrast Media] Hives, Nausea And Vomiting and Cough    Pt will need a 13 hour prep before any contrast per Dr. Nelson Chimes (05/14/19)  . Hctz [Hydrochlorothiazide] Other (See Comments)    palpitations  . Metoprolol Succinate [Metoprolol] Other (See Comments)    Bradycardia     Social History   Socioeconomic History  . Marital status: Married    Spouse name: Not on file  . Number of children: Not on file  . Years of education: Not on file  . Highest education level: Not on file  Occupational History  . Not on file  Tobacco Use  . Smoking status: Former  . Smokeless tobacco: Former    Types: Secondary school teacher  . Vaping Use: Never used  Substance and Sexual Activity  . Alcohol use: Yes    Alcohol/week: 0.0 standard drinks of alcohol    Comment: occasionally  . Drug use: No  . Sexual activity: Not on file  Other Topics Concern  . 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 Stroke Mother 70  . Heart failure Father   . Diverticulitis Sister   . Stroke Brother 65  . Heart attack Brother   . Colon  cancer Neg Hx   . Esophageal cancer Neg Hx   . Stomach cancer Neg Hx   . Rectal cancer Neg Hx      Review of Systems Shortness of breath with activity Headaches  Constitutional: negative for anorexia, fevers and sweats  Eyes: negative for irritation, redness and visual disturbance  Ears, nose, mouth, throat, and face: negative for earaches, epistaxis, nasal congestion and sore throat  Respiratory: negative for cough, sputum and wheezing  Cardiovascular: negative for chest pain,lower extremity edema, orthopnea, palpitations and syncope  Gastrointestinal: negative for abdominal pain, constipation, diarrhea, melena, nausea and vomiting  Genitourinary:negative for dysuria, frequency and hematuria  Hematologic/lymphatic: negative for bleeding, easy bruising and lymphadenopathy  Musculoskeletal:negative for arthralgias, muscle weakness and stiff joints  Neurological: negative for coordination problems, gait problemsand weakness  Endocrine: negative for  diabetic symptoms including polydipsia, polyuria and weight loss     Objective:   Physical Exam  Gen. Pleasant, well-nourished, in no distress, normal affect ENT - no pallor,icterus, no post nasal drip Neck: No JVD, no thyromegaly, no carotid bruits Lungs: no use of accessory muscles, no dullness to percussion, clear without rales or rhonchi  Cardiovascular: Rhythm regular, heart sounds  normal, no murmurs or gallops, no peripheral edema Abdomen: soft and non-tender, no hepatosplenomegaly, BS normal. Musculoskeletal: No deformities, no cyanosis or clubbing Neuro:  alert, non focal       Assessment & Plan:

## 2022-02-07 ENCOUNTER — Ambulatory Visit (HOSPITAL_COMMUNITY)
Admission: RE | Admit: 2022-02-07 | Discharge: 2022-02-07 | Disposition: A | Discharge status: Home / Self Care | Payer: 59 | Source: Ambulatory Visit | Attending: Pulmonary Disease | Admitting: Pulmonary Disease

## 2022-02-07 ENCOUNTER — Encounter (HOSPITAL_COMMUNITY)
Admission: RE | Admit: 2022-02-07 | Discharge: 2022-02-07 | Disposition: A | Discharge status: Home / Self Care | Payer: 59 | Source: Ambulatory Visit | Attending: Pulmonary Disease | Admitting: Pulmonary Disease

## 2022-02-07 DIAGNOSIS — R0609 Other forms of dyspnea: Secondary | ICD-10-CM | POA: Insufficient documentation

## 2022-02-07 MED ORDER — TECHNETIUM TO 99M ALBUMIN AGGREGATED
4.3000 | Freq: Once | INTRAVENOUS | Status: AC
  Administered 2022-02-07: 4.3 via INTRAVENOUS

## 2022-02-15 ENCOUNTER — Ambulatory Visit: Payer: 59 | Attending: Internal Medicine | Admitting: Internal Medicine

## 2022-02-15 ENCOUNTER — Encounter: Payer: Self-pay | Admitting: Internal Medicine

## 2022-02-15 VITALS — BP 132/84 | HR 62 | Ht 70.0 in | Wt 226.2 lb

## 2022-02-15 DIAGNOSIS — I6523 Occlusion and stenosis of bilateral carotid arteries: Secondary | ICD-10-CM | POA: Diagnosis not present

## 2022-02-15 DIAGNOSIS — I2510 Atherosclerotic heart disease of native coronary artery without angina pectoris: Secondary | ICD-10-CM | POA: Diagnosis not present

## 2022-02-15 DIAGNOSIS — E785 Hyperlipidemia, unspecified: Secondary | ICD-10-CM

## 2022-02-15 DIAGNOSIS — Z9889 Other specified postprocedural states: Secondary | ICD-10-CM | POA: Diagnosis not present

## 2022-02-15 MED ORDER — EZETIMIBE 10 MG PO TABS: 10.0000 mg | ORAL_TABLET | Freq: Every day | ORAL | 3 refills | Status: DC

## 2022-02-15 MED ORDER — ATORVASTATIN CALCIUM 40 MG PO TABS: 40.0000 mg | ORAL_TABLET | Freq: Every day | ORAL | 3 refills | Status: DC

## 2022-02-15 NOTE — Progress Notes (Signed)
OFFICE CONSULT NOTE  Chief Complaint:  Manage dyslipidemia  Primary Care Physician: Derinda Late, MD  HPI:  Chad Berry is a 81 y.o. male who is being seen today for the evaluation of dyslipidemia at the request of Lorretta Harp, MD. this is a pleasant 81 year old male followed by Dr. 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  Prior to that he had cardiac catheterization which showed mild nonobstructive coronary disease.  He is also had a CT scan of the chest which showed atherosclerosis and coronary calcification.  Carotid Dopplers of also showed some mild atherosclerosis.  He had previously had very good lipid control.  His last cholesterol in September 2022 showed total 93, triglycerides 74, HDL 42 and LDL 36.  He was on combination therapy with ezetimibe and rosuvastatin 40 mg daily.  Prior to that he had been on Livalo in the past but it apparently was not effective enough.  He then developed some muscle aches which were attributed to rosuvastatin although he had been on it for several years.  Most studies show that this is unlikely to happen however after stopping the rosuvastatin, he says his symptoms have improved.  He also stopped ezetimibe.  I am not sure if he was instructed to do both but his rebound lipid testing showed marked increases in his cholesterol.  He was therefore referred for evaluation and management of his cholesterol and to consider possibly a PCSK9 inhibitor.  PMHx:  Past Medical History:  Diagnosis Date   Aortic atherosclerosis (HCC)    Bradycardia    while on BB with HR to the 40s   Carotid stenosis    CAROTID DOPPLER, 12/07/2011 - Mild arthrosclerotic changes, no high-grade stenosis   Cataract    rt. eye   Clostridium difficile infection 05/2019   Diverticulitis    Heart murmur    History of echocardiogram    Echo 9/16:  EF 60-65%, no RWMA, Gr 1 DD, holosystolic MVP of posterior leaflet, mild MR, LA upper  limits of normal, normal RVF, mod TR, PASP 50 mmHg   Hypercholesteremia    Hyperlipidemia    Hypertension    Hypothyroidism    MVP (mitral valve prolapse)    a. Echo 12/07/2011 - EF-65-70%, severe mitral regurgitation, moderate-severe tricuspid regurgitation, moderate pulmonary HTN, moderate pulmonic regurgitation;  b. 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   Complex valvuloplasty including artificial Gore-tex neochords x10 with 28 mm Medtronic Simuform ring annuloplasty   Testicular hypofunction    Vitamin D deficiency     Past Surgical History:  Procedure Laterality Date   COLONOSCOPY     ETHMOIDECTOMY Left 08/13/2020   Procedure: SPENOIDECTOMY AND ETHMOIDECTOMY;  Surgeon: Rozetta Nunnery, MD;  Location: [facility];  Service: ENT;  Laterality: Left;   MITRAL VALVE REPAIR Right 09/22/2020   Procedure: MINIMALLY INVASIVE MITRAL VALVE REPAIR (MVR) USING MEDTRONIC SIMUFORM SEMI-RIGID ANNULOPLASTY RING SIZE 28MM ON PUMP;  Surgeon: Rexene Alberts, MD;  Location: [facility];  Service: Open Heart Surgery;  Laterality: Right;  RIGHT/LEFT HEART CATH AND CORONARY ANGIOGRAPHY N/A 08/30/2020   Procedure: RIGHT/LEFT HEART CATH AND CORONARY ANGIOGRAPHY;  Surgeon: Lorretta Harp, MD;  Location: [facility];  Service: Cardiovascular;  Laterality: N/A;   SIGMOIDOSCOPY  02/14/2017   SINUS ENDO WITH FUSION Left 08/13/2020   Procedure: SINUS ENDO WITH FUSION;  Surgeon: Rozetta Nunnery, MD;  Location: [facility];  Service: ENT;  Laterality: Left;   TEE WITHOUT CARDIOVERSION N/A 05/04/2020    Procedure: TRANSESOPHAGEAL ECHOCARDIOGRAM (TEE);  Surgeon: Donato Heinz, MD;  Location: [facility];  Service: Cardiovascular;  Laterality: N/A;   TEE WITHOUT CARDIOVERSION N/A 09/22/2020   Procedure: TRANSESOPHAGEAL ECHOCARDIOGRAM (TEE);  Surgeon: Rexene Alberts, MD;  Location: [facility];  Service: Open Heart Surgery;  Laterality: N/A;    FAMHx:  Family History  Problem Relation Age of Onset   Stroke Mother 43   Heart failure Father    Diverticulitis Sister    Stroke Brother 51   Heart attack Brother    Colon cancer Neg Hx    Esophageal cancer Neg Hx    Stomach cancer Neg Hx    Rectal cancer Neg Hx     SOCHx:   reports that he has quit smoking. He has quit using smokeless tobacco.  His smokeless tobacco use included chew. He reports current alcohol use. He reports that he does not use drugs.  ALLERGIES:  Allergies  Allergen Reactions   Contrast Media [Iodinated Contrast Media] Hives, Nausea And Vomiting and Cough    Pt will need a 13 hour prep before any contrast per Dr. Nelson Chimes (05/14/19)   Hctz [Hydrochlorothiazide] Other (See Comments)    palpitations   Metoprolol Succinate [Metoprolol] Other (See Comments)    Bradycardia      ROS: Pertinent items noted in HPI and remainder of comprehensive ROS otherwise negative.  HOME MEDS: Current Outpatient Medications on File Prior to Visit  Medication Sig Dispense Refill   ALPRAZolam (XANAX) 0.5 MG tablet Take 0.5 mg by mouth at bedtime as needed.     amoxicillin (AMOXIL) 500 MG capsule Take 4 capsules (2,000mg ) by mouth 30-60 minutes prior to dental work or cleaning. 4 capsule 3   Ascorbic Acid (VITAMIN C) 1000 MG tablet Take 1,000 mg by mouth daily.     Cholecalciferol (VITAMIN D) 50 MCG (2000 UT) tablet Take 2,000 Units by mouth daily.     levothyroxine (SYNTHROID) 75 MCG tablet Take 75 mcg by mouth at bedtime.     losartan (COZAAR) 50 MG tablet Take 2 tablets (100 mg total) by mouth daily. (Patient taking  differently: Take 50 mg by mouth daily.) 60 tablet 0   tamsulosin (FLOMAX) 0.4 MG CAPS capsule Take 0.4 mg by mouth at bedtime.     vitamin B-12 (CYANOCOBALAMIN) 100 MCG tablet Take 100 mcg by mouth daily.     No current facility-administered medications on file prior to visit.    LABS/IMAGING: No results found for this or any previous visit (from the past 48 hour(s)). No results found.  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   Mr. Malinak has a mixed dyslipidemia and was well treated on combination therapy with rosuvastatin and ezetimibe however had developed some possible side effects related to rosuvastatin.  He has stopped both and his cholesterol is rebounded higher.  He had previously been on Livalo but tolerated it, however he said it was not effective enough.  He is not really statin intolerant at this point having only failed 1 statin and would not qualify for PCSK9 inhibitor based on that.  He should restart his ezetimibe, because it is unlikely that that was causing his symptoms.  I would advise trying another statin.  We will go ahead with atorvastatin 40 mg daily.  If he has side effects with this, then we could consider trying to get authorization for PCSK9 inhibitor.  Thanks again for the kind referral.  Pixie Casino, MD, FACC, [place] Director of the Advanced Lipid Disorders  &  Cardiovascular [facility] Diplomate of the American Board of Clinical Lipidology Attending Cardiologist  Direct Dial: [phone]  Fax: [phone]  Website:  [email or web address]  Nadean Corwin Bernardina Cacho 02/15/2022, 1:27 PM

## 2022-02-15 NOTE — Patient Instructions (Signed)
Medication Instructions:  RESUME zetia 10mg  daily START atorvastatin 40mg  daily  *If you need a refill on your cardiac medications before your next appointment, please call your pharmacy*   Lab Work: FASTING lab work to check cholesterol in 3-4 months -- complete about 1 week before next visit  If you have labs (blood work) drawn today and your tests are completely normal, you will receive your results only by: [facility] (if you have MyChart) OR A paper copy in the mail If you have any lab test that is abnormal or we need to change your treatment, we will call you to review the results.   Follow-Up: At [facility], you and your health needs are our priority.  As part of our continuing mission to provide you with exceptional heart care, we have created designated Provider Care Teams.  These Care Teams include your primary Cardiologist (physician) and Advanced Practice Providers (APPs -  Physician Assistants and Nurse Practitioners) who all work together to provide you with the care you need, when you need it.  We recommend signing up for the patient portal called "MyChart".  Sign up information is provided on this After Visit Summary.  MyChart is used to connect with patients for Virtual Visits (Telemedicine).  Patients are able to view lab/test results, encounter notes, upcoming appointments, etc.  Non-urgent messages can be sent to your provider as well.   To learn more about what you can do with MyChart, go to [email or web address].    Your next appointment:    3-4 months with Dr. Debara Pickett -- lipid clinic

## 2022-02-23 ENCOUNTER — Other Ambulatory Visit: Payer: Self-pay

## 2022-03-01 ENCOUNTER — Telehealth: Payer: Self-pay | Admitting: Cardiovascular Disease

## 2022-03-01 NOTE — Telephone Encounter (Signed)
Left message for Chad Berry.  It looks like patient see Dr. Elsworth Soho for pulmonology. Recommend reaching out to his office for referral.

## 2022-03-01 NOTE — Telephone Encounter (Signed)
Caller stated that the patient is requesting a referral to Pulmonary Rehab due to pulmonary injury from [place].  Caller stated her VM is confidential.

## 2022-03-15 ENCOUNTER — Encounter: Payer: Self-pay | Admitting: Internal Medicine

## 2022-03-15 ENCOUNTER — Encounter (HOSPITAL_BASED_OUTPATIENT_CLINIC_OR_DEPARTMENT_OTHER): Payer: Self-pay | Admitting: Pulmonary Disease

## 2022-05-12 ENCOUNTER — Encounter: Payer: Self-pay | Admitting: Internal Medicine

## 2022-05-16 ENCOUNTER — Telehealth: Payer: 59 | Admitting: Internal Medicine

## 2022-05-16 LAB — NO SPECIMEN RECEIVED

## 2022-05-16 NOTE — Telephone Encounter (Signed)
Message from Otila Kluver 05/15/22 This patient came by 05-15-22 - needs to reschu...has questions about starting back on meds. Please call him .    Spoke with patient of Dr. Debara Pickett   He reports persistent joint/muscle pain for months. He was told to stop atorvastatin a few months ago but NEVER followed up on how he felt off this medication - thus no med changes were made. He reports his pain is better off the medication, not completely resolved. Explained that if muscle/joint aches were related to medication, they should resolve once med is stopped. Advised he contact PCP for a work up on his persistent pains.   He would like to see if once his lab results come back - NMR, LPa - if med changes can be made based on this. There are no openings for a sooner follow up. He was already aware of possibility of PCSK9i which he is open to. Will send to MD to see if this can be prescribed, if warranted, and then f/u in 3-4 months after that (with repeat labs) is OK.   He said his PCP also called our office to speed this up - do not see notes about this.

## 2022-05-17 ENCOUNTER — Other Ambulatory Visit: Payer: Self-pay | Admitting: *Deleted

## 2022-05-17 ENCOUNTER — Telehealth: Payer: Self-pay | Admitting: Internal Medicine

## 2022-05-17 DIAGNOSIS — E785 Hyperlipidemia, unspecified: Secondary | ICD-10-CM

## 2022-05-17 LAB — NMR, LIPOPROFILE
Cholesterol, Total: 217 mg/dL — ABNORMAL HIGH (ref 100–199)
HDL Particle Number: 26.9 umol/L — ABNORMAL LOW (ref >=30.5)
HDL-C: 52 mg/dL (ref >39)
LDL Particle Number: 1837 nmol/L — ABNORMAL HIGH (ref <1000)
LDL Size: 21.4 nm (ref >20.5)
LDL-C (NIH Calc): 151 mg/dL — ABNORMAL HIGH (ref 0–99)
LP-IR Score: 25 (ref <=45)
Small LDL Particle Number: 654 nmol/L — ABNORMAL HIGH (ref <=527)
Triglycerides: 81 mg/dL (ref 0–149)

## 2022-05-17 LAB — LIPOPROTEIN A (LPA): Lipoprotein (a): 15.3 nmol/L (ref <75.0)

## 2022-05-17 MED ORDER — REPATHA SURECLICK 140 MG/ML ~~LOC~~ SOAJ: 1.0000 mL | SUBCUTANEOUS | 3 refills | Status: DC

## 2022-05-17 NOTE — Telephone Encounter (Signed)
Pt c/o medication issue:  1. Name of Medication:   Evolocumab (REPATHA SURECLICK) 686 MG/ML SOAJ   2. How are you currently taking this medication (dosage and times per day)?   3. Are you having a reaction (difficulty breathing--STAT)?   4. What is your medication issue?    Caller stated patient will need prior authorization to have this prescription filled.

## 2022-05-18 NOTE — Telephone Encounter (Signed)
Medication approved  Request Reference Number: [id number]. REPATHA SURE INJ 140MG /ML is approved through 05/19/2023.

## 2022-05-18 NOTE — Telephone Encounter (Signed)
Received PA request in CMM. Unable to submit as name/DOB do not match. [place] to obtain insurance phone # [phone])  PA submitted with new key code, generated by insurance/[employer] rep (Key: [id number])

## 2022-05-26 ENCOUNTER — Ambulatory Visit: Payer: 59 | Admitting: Internal Medicine

## 2022-05-31 ENCOUNTER — Telehealth: Payer: Self-pay | Admitting: Internal Medicine

## 2022-05-31 ENCOUNTER — Telehealth: Payer: Self-pay | Admitting: Pulmonary Disease

## 2022-05-31 NOTE — Telephone Encounter (Signed)
Message has been sent to Dr. Debara Pickett to review

## 2022-05-31 NOTE — Telephone Encounter (Signed)
Dr. Derinda Late office calling [phone]) They would like test results we ordered sent to them for this PT FU appt on Monday.  FAX # [id number] & [phone]

## 2022-05-31 NOTE — Telephone Encounter (Signed)
  Pt is calling to follow up his mychart message, he said he is having reaction to repatha. He doesn't want to wait 48 hours for reply and requesting if RN Eliezer Lofts can call him back today

## 2022-05-31 NOTE — Telephone Encounter (Signed)
Sounds like he may not have used the correct injection technique. Should not be bruising 10 days after injection. (I assume he meant he injected on 1/21 not 12/21)

## 2022-06-01 ENCOUNTER — Encounter: Payer: Self-pay | Admitting: Internal Medicine

## 2022-06-01 NOTE — Telephone Encounter (Signed)
Printed off VQ scan results and chest xray results. Faxed to Cheri at PCP office. Nothing further needed

## 2022-06-02 NOTE — Telephone Encounter (Signed)
Addressed via separate encounter

## 2022-06-07 ENCOUNTER — Telehealth: Payer: Self-pay | Admitting: Pharmacist

## 2022-06-07 MED ORDER — REPATHA SURECLICK 140 MG/ML ~~LOC~~ SOAJ: 1.0000 | SUBCUTANEOUS | 0 refills | Status: DC

## 2022-06-07 NOTE — Telephone Encounter (Signed)
Received fax that pt requesting 1 replacement Repatha pen to [facility]. Rx e-prescribed.

## 2022-06-16 ENCOUNTER — Ambulatory Visit: Payer: 59 | Attending: Nurse Practitioner | Admitting: Nurse Practitioner

## 2022-06-16 VITALS — BP 132/82 | HR 63 | Ht 70.0 in | Wt 227.2 lb

## 2022-06-16 DIAGNOSIS — I10 Essential (primary) hypertension: Secondary | ICD-10-CM | POA: Diagnosis not present

## 2022-06-16 DIAGNOSIS — I340 Nonrheumatic mitral (valve) insufficiency: Secondary | ICD-10-CM

## 2022-06-16 DIAGNOSIS — Z9889 Other specified postprocedural states: Secondary | ICD-10-CM

## 2022-06-16 DIAGNOSIS — I480 Paroxysmal atrial fibrillation: Secondary | ICD-10-CM

## 2022-06-16 DIAGNOSIS — E039 Hypothyroidism, unspecified: Secondary | ICD-10-CM

## 2022-06-16 DIAGNOSIS — I6523 Occlusion and stenosis of bilateral carotid arteries: Secondary | ICD-10-CM

## 2022-06-16 DIAGNOSIS — Z86718 Personal history of other venous thrombosis and embolism: Secondary | ICD-10-CM

## 2022-06-16 DIAGNOSIS — E785 Hyperlipidemia, unspecified: Secondary | ICD-10-CM

## 2022-06-16 NOTE — Progress Notes (Unsigned)
Office Visit    Patient Name: Chad Berry Date of Encounter: 06/16/2022  Primary Care Provider:  Derinda Late, MD Primary Cardiologist:  Quay Burow, MD  Chief Complaint   82 year old male with a history of palpitations, severe mitral valve regurgitation s/p minimally invasive mitral valve repair in 08/2020, hypertension, hyperlipidemia, DVT, carotid artery stenosis, and hypothyroidism who presents for follow-up related to mitral valve regurgitation.   Past Medical History    Past Medical History:  Diagnosis Date   Aortic atherosclerosis (HCC)    Bradycardia    while on BB with HR to the 40s   Carotid stenosis    CAROTID DOPPLER, 12/07/2011 - Mild arthrosclerotic changes, no high-grade stenosis   Cataract    rt. eye   Clostridium difficile infection 05/2019   Diverticulitis    Heart murmur    History of echocardiogram    Echo 9/16:  EF 60-65%, no RWMA, Gr 1 DD, holosystolic MVP of posterior leaflet, mild MR, LA upper limits of normal, normal RVF, mod TR, PASP 50 mmHg   Hypercholesteremia    Hyperlipidemia    Hypertension    Hypothyroidism    MVP (mitral valve prolapse)    a. Echo 12/07/2011 - EF-65-70%, severe mitral regurgitation, moderate-severe tricuspid regurgitation, moderate pulmonary HTN, moderate pulmonic regurgitation;  b. 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   Complex valvuloplasty including artificial Gore-tex neochords x10 with 28 mm Medtronic Simuform ring annuloplasty   Testicular hypofunction    Vitamin D deficiency    Past Surgical History:   Procedure Laterality Date   COLONOSCOPY     ETHMOIDECTOMY Left 08/13/2020   Procedure: SPENOIDECTOMY AND ETHMOIDECTOMY;  Surgeon: Rozetta Nunnery, MD;  Location: [place];  Service: ENT;  Laterality: Left;   MITRAL VALVE REPAIR Right 09/22/2020   Procedure: MINIMALLY INVASIVE MITRAL VALVE REPAIR (MVR) USING MEDTRONIC SIMUFORM SEMI-RIGID ANNULOPLASTY RING SIZE 28MM ON PUMP;  Surgeon: Rexene Alberts, MD;  Location: [place];  Service: Open Heart Surgery;  Laterality: Right;   RIGHT/LEFT HEART CATH AND CORONARY ANGIOGRAPHY N/A 08/30/2020   Procedure: RIGHT/LEFT HEART CATH AND CORONARY ANGIOGRAPHY;  Surgeon: Lorretta Harp, MD;  Location: [place] [facility];  Service: Cardiovascular;  Laterality: N/A;   SIGMOIDOSCOPY  02/14/2017   SINUS ENDO WITH FUSION Left 08/13/2020   Procedure: SINUS ENDO WITH FUSION;  Surgeon: Rozetta Nunnery, MD;  Location: [place];  Service: ENT;  Laterality: Left;   TEE WITHOUT CARDIOVERSION N/A 05/04/2020   Procedure: TRANSESOPHAGEAL ECHOCARDIOGRAM (TEE);  Surgeon: Donato Heinz, MD;  Location: [place];  Service: Cardiovascular;  Laterality: N/A;   TEE WITHOUT CARDIOVERSION N/A 09/22/2020   Procedure: TRANSESOPHAGEAL ECHOCARDIOGRAM (TEE);  Surgeon: Rexene Alberts, MD;  Location: [place];  Service: Open Heart Surgery;  Laterality: N/A;    Allergies  Allergies  Allergen Reactions   Contrast Media [Iodinated Contrast Media] Hives, Nausea And Vomiting and Cough    Pt will need a 13 hour prep before any contrast per Dr. Nelson Chimes (05/14/19)   Atorvastatin Other (See Comments)    Myalgias, joint pain  Hctz [Hydrochlorothiazide] Other (See Comments)    palpitations   Metoprolol Succinate [Metoprolol] Other (See Comments)    Bradycardia       Labs/Other Studies Reviewed    The following studies were reviewed today: Echo 09/09/2021: IMPRESSIONS    1. Left ventricular ejection fraction, by estimation, is 60 to 65%. The  left ventricle has normal  function. The left ventricle has no regional  wall motion abnormalities. There is mild left ventricular hypertrophy.  Left ventricular diastolic parameters  are indeterminate.   2. Right ventricular systolic function is normal. The right ventricular  size is mildly enlarged. There is mildly elevated pulmonary artery  systolic pressure. The estimated right ventricular systolic pressure is  0000000 mmHg.   3. Left atrial size was mildly dilated.   4. There is a prosthetic annuloplasty ring present in the mitral position      Trivial mitral valve regurgitation. The mean mitral valve gradient is  4.0 mmHg with average heart rate of 65 bpm, unchanged from prior echo  03/21/21   5. The aortic valve is tricuspid. Aortic valve regurgitation is trivial.  Aortic valve sclerosis is present, with no evidence of aortic valve  stenosis.   6. Aortic dilatation noted. There is dilatation of the aortic root,  measuring 41 mm. There is dilatation of the ascending aorta, measuring 40  mm.   7. The inferior vena cava is dilated in size with >50% respiratory  variability, suggesting right atrial pressure of 8 mmHg.   R/LHC 08/2020: Left Anterior Descending Mid LAD lesion is 30% stenosed.  Right Coronary Artery Prox RCA to Mid RCA lesion is 30% stenosed.  Intervention No interventions have been documentedight Heart Pressures   Hemodynamic findings consistent with mitral valve regurgitation. Right atrial pressure- 10/0 Right ventricular pressure- 37/3 Pulmonary artery pressure-39/10, mean 24 Pulmonary wedge pressure-A-wave 17, V wave 20, mean 13 LVEDP- 14 Cardiac output- 8.6 L/min with an index of 3.9 L/min/m.[name]  Recent Labs: No results found for requested labs within last 365 days.  Recent Lipid Panel No results found for: "CHOL", "TRIG", "HDL", "CHOLHDL", "VLDL", "LDLCALC", "LDLDIRECT"  History of Present Illness    82 year old male with the above past medical history including palpitations,  severe mitral valve regurgitation s/p minimally invasive mitral valve repair in 08/2020, hypertension, hyperlipidemia, DVT, carotid artery stenosis, and hypothyroidism.  Prior cardiac monitor showed PVCs, short runs of NSVT, PSVT.  He was evaluated by EP who recommended conservative therapy.  TEE in 05/2020 revealed a flail P2 segment with severe MR.  R/LHC in 2022 revealed normal coronary arteries, severe MR.  Pre-surgery dopplers with 1 to 39% B ICA stenosis.  He underwent minimally invasive mitral valve repair with Dr. Roxy Manns in 08/2020.  1 month later he was hospitalized with a DVT.  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  Subsequent Zio patch did not show any recurrent arrhythmia.  Eliquis was ultimately discontinued.  Most recent echocardiogram in 08/2021 revealed normal LV function, no evidence of MR, mild elevation in RV pressures. Of note, he has a history of statin intolerance, follows with Dr. Debara Pickett in the lipid clinic, on [address]. He was last seen in the office on 11/30/2021 and was stable from a cardiac standpoint.    He presents today for follow-up.  Since his last visit he has been stable from a cardiac standpoint.  Since the time of his mitral valve surgery he has  had stable chronic dyspnea on exertion, generalized fatigue.  He denies chest pain, palpitations, dizziness, edema, PND, orthopnea, weight gain.  BP has been well-controlled.  He has only taken 1 dose of Repatha.  Hi second injection was delayed due to a faulty injection pen that he initially received for his second dose.  He just received a replacement pen and plans to take his second dose of Repatha this week.  He has follow-up scheduled with Dr. Debara Pickett in April.    Home Medications    Current Outpatient Medications  Medication Sig Dispense Refill   ALPRAZolam (XANAX) 0.5 MG tablet Take 0.5 mg by mouth at bedtime as needed.     amoxicillin  (AMOXIL) 500 MG capsule Take 4 capsules (2,036m) by mouth 30-60 minutes prior to dental work or cleaning. 4 capsule 3   Ascorbic Acid (VITAMIN C) 1000 MG tablet Take 1,000 mg by mouth daily.     Cholecalciferol (VITAMIN D) 50 MCG (2000 UT) tablet Take 2,000 Units by mouth daily.     Evolocumab (REPATHA SURECLICK) [id number]/ML SOAJ Inject 140 mg into the skin every 14 (fourteen) days. 6 mL 3   ezetimibe (ZETIA) 10 MG tablet Take 1 tablet (10 mg total) by mouth daily. 90 tablet 3   levothyroxine (SYNTHROID) 75 MCG tablet Take 75 mcg by mouth at bedtime.     tamsulosin (FLOMAX) 0.4 MG CAPS capsule Take 0.4 mg by mouth at bedtime.     vitamin B-12 (CYANOCOBALAMIN) 100 MCG tablet Take 100 mcg by mouth daily.     losartan (COZAAR) 50 MG tablet Take 2 tablets (100 mg total) by mouth daily. (Patient taking differently: Take 50 mg by mouth daily.) 60 tablet 0   No current facility-administered medications for this visit.     Review of Systems    He denies chest pain, palpitations, pnd, orthopnea, n, v, dizziness, syncope, edema, weight gain, or early satiety. All other systems reviewed and are otherwise negative except as noted above.   Physical Exam    VS:  BP 132/82   Pulse 63   Ht 5' 10"$  (1.778 m)   Wt 227 lb 3.2 oz (103.1 kg)   SpO2 98%   BMI 32.60 kg/m   GEN: Well nourished, well developed, in no acute distress. HEENT: normal. Neck: Supple, no JVD, carotid bruits, or masses. Cardiac: RRR, no murmurs, rubs, or gallops. No clubbing, cyanosis, edema.  Radials/DP/PT 2+ and equal bilaterally.  Respiratory:  Respirations regular and unlabored, clear to auscultation bilaterally. GI: Soft, nontender, nondistended, BS + x 4. MS: no deformity or atrophy. Skin: warm and dry, no rash. Neuro:  Strength and sensation are intact. Psych: Normal affect.  Accessory Clinical Findings    ECG personally reviewed by me today -no EKG in office today.   Lab Results  Component Value Date   WBC 5.8  11/10/2020   HGB 14.7 11/10/2020   HCT 43.2 11/10/2020   MCV 92.1 11/10/2020   PLT 189 11/10/2020   Lab Results  Component Value Date   CREATININE 1.18 11/10/2020   BUN 12 11/10/2020   NA 138 11/10/2020   K 4.2 11/10/2020   CL 104 11/10/2020   CO2 23 11/10/2020   Lab Results  Component Value Date   ALT 59 (H) 11/08/2020   AST 34 11/08/2020   ALKPHOS 81 11/08/2020   BILITOT 0.6 11/08/2020   No results found for: "CHOL", "HDL", "LDLCALC", "LDLDIRECT", "TRIG", "CHOLHDL"  Lab Results  Component Value Date   HGBA1C  6.0 (H) 09/20/2020    Assessment & Plan   1. Mitral valve regurgitation: S/p minimally invasive mitral valve repair in 08/2020.  Most recent echo in 08/2021 revealed normal LV function, no evidence of MR, mild elevation in RV pressures.  At the time of his surgery he has had stable chronic dyspnea, unchanged. Of note, LHC in 2022 revealed coronary arteries.  Repeat echo scheduled for 08/2022. Continue SBE prophylaxis.   2. Palpitations/paroxysmal atrial fibrillation: Previously on Coumadin/Eliquis. This has since been discontinued given no recurrence of arrhythmia. Maintaining NSR. Denies any recent palpitations.   3. Hypertension: BP well controlled. Continue current antihypertensive regimen.   4. Hyperlipidemia: LDL was 151 in 05/2022. Recently started on Repatha. He has follow-up scheduled with Dr. Debara Pickett in 07/2022.   5. History of DVT: Occurred in the postop setting. Previously on Eliquis. No recurrence.   6. Carotid artery stenosis: Pr-surgery dopplers in 2022 showed 1-39% BICA stenosis. Asymptomatic. No indication for repeat study at this time.  7. Hypothyroidism: Most recent TSH not available for review. Monitored and managed per PCP. Continue levothyroxine.   8. Disposition: Follow-up with Dr. Gwenlyn Found in 6 months.      Lenna Sciara, NP 06/16/2022, 2:10 PM

## 2022-06-16 NOTE — Patient Instructions (Signed)
Medication Instructions:  Your physician recommends that you continue on your current medications as directed. Please refer to the Current Medication list given to you today.  *If you need a refill on your cardiac medications before your next appointment, please call your pharmacy*   Lab Work: NONE ordered at this time of appointment   If you have labs (blood work) drawn today and your tests are completely normal, you will receive your results only by: [place] (if you have MyChart) OR A paper copy in the mail If you have any lab test that is abnormal or we need to change your treatment, we will call you to review the results.   Testing/Procedures: Keep Echocardiogram appointment as directed.    Follow-Up: At [facility], you and your health needs are our priority.  As part of our continuing mission to provide you with exceptional heart care, we have created designated Provider Care Teams.  These Care Teams include your primary Cardiologist (physician) and Advanced Practice Providers (APPs -  Physician Assistants and Nurse Practitioners) who all work together to provide you with the care you need, when you need it.  We recommend signing up for the patient portal called "MyChart".  Sign up information is provided on this After Visit Summary.  MyChart is used to connect with patients for Virtual Visits (Telemedicine).  Patients are able to view lab/test results, encounter notes, upcoming appointments, etc.  Non-urgent messages can be sent to your provider as well.   To learn more about what you can do with MyChart, go to [email or web address].    Your next appointment:   6 month(s)  Provider:   Quay Burow, MD     Other Instructions

## 2022-06-18 ENCOUNTER — Encounter: Payer: Self-pay | Admitting: Nurse Practitioner

## 2022-07-11 IMAGING — CR DG CHEST 2V
2 series · 2 of 2 positions shown · non-contrast
Comparison: 08/19/2021

CLINICAL DATA: Shortness of breath.

EXAM:
CHEST - 2 VIEW

[w chest pa]
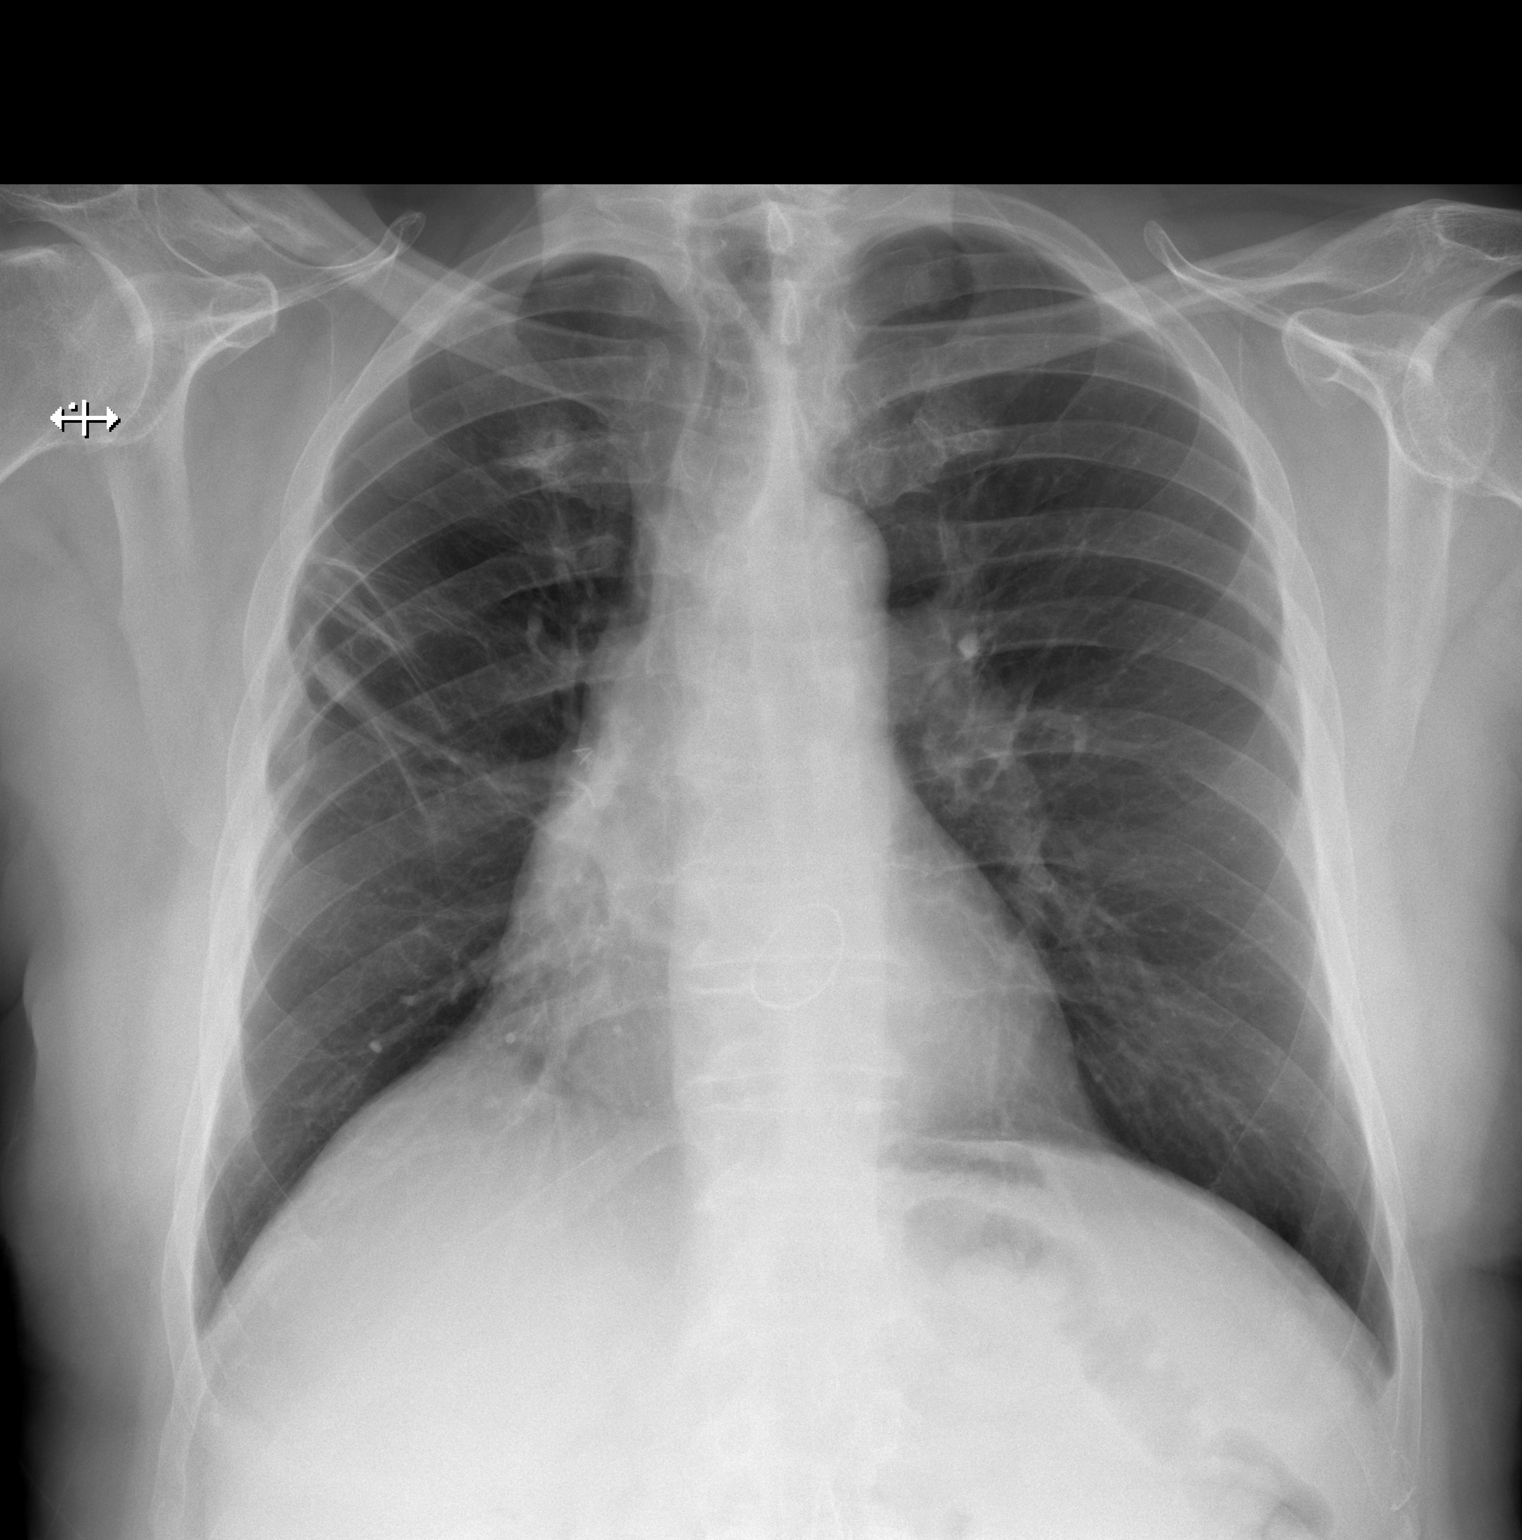

[w chest lat]
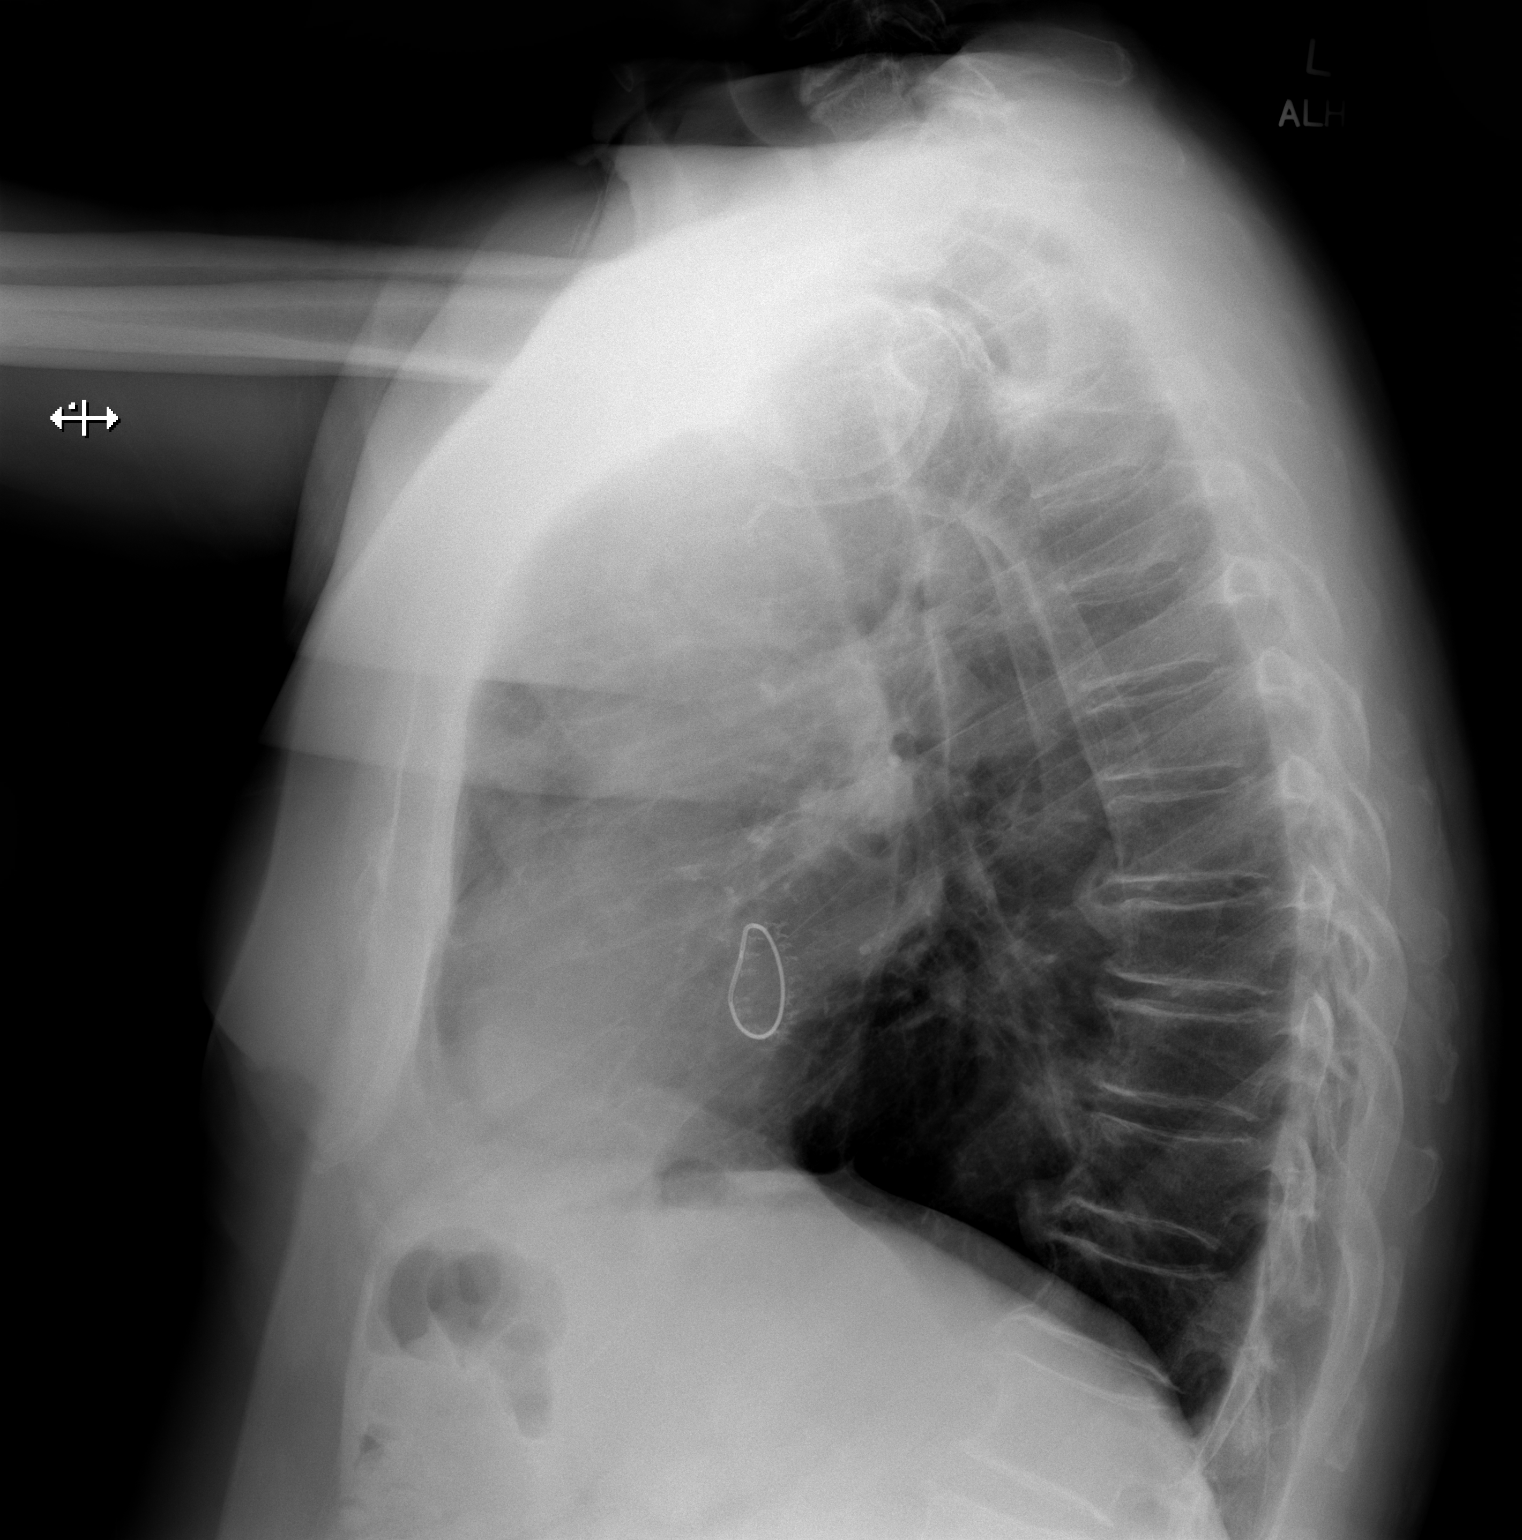

[2 of 2 positions shown; findings below may reference images not displayed]

FINDINGS: Architectural distortion and scarring in the right mid lung is
stable. Right-sided suprahilar nodular density is stable, noted to
be related to the anterior first rib on chest CT 09/16/2021. The
cardiopericardial silhouette is within normal limits for size. The
visualized bony structures of the thorax are unremarkable.
IMPRESSION: Stable.  No acute cardiopulmonary findings.

## 2022-07-31 ENCOUNTER — Encounter: Payer: Self-pay | Admitting: Internal Medicine

## 2022-07-31 ENCOUNTER — Ambulatory Visit: Payer: 59 | Attending: Internal Medicine | Admitting: Internal Medicine

## 2022-07-31 VITALS — BP 138/78 | HR 64 | Ht 70.0 in | Wt 230.8 lb

## 2022-07-31 DIAGNOSIS — T466X5A Adverse effect of antihyperlipidemic and antiarteriosclerotic drugs, initial encounter: Secondary | ICD-10-CM

## 2022-07-31 DIAGNOSIS — E785 Hyperlipidemia, unspecified: Secondary | ICD-10-CM | POA: Diagnosis not present

## 2022-07-31 DIAGNOSIS — M791 Myalgia, unspecified site: Secondary | ICD-10-CM | POA: Diagnosis not present

## 2022-07-31 DIAGNOSIS — I2510 Atherosclerotic heart disease of native coronary artery without angina pectoris: Secondary | ICD-10-CM

## 2022-07-31 NOTE — Patient Instructions (Signed)
Medication Instructions:  Your physician recommends that you continue on your current medications as directed. Please refer to the Current Medication list given to you today.  *If you need a refill on your cardiac medications before your next appointment, please call your pharmacy*   Lab Work: FASTING lab work as soon as able   FASTING lab work to check cholesterol in 1 year -- before next visit with Dr. Debara Pickett  If you have labs (blood work) drawn today and your tests are completely normal, you will receive your results only by: MyChart Message (if you have [place]) OR A paper copy in the mail If you have any lab test that is abnormal or we need to change your treatment, we will call you to review the results.    Follow-Up: At [facility], you and your health needs are our priority.  As part of our continuing mission to provide you with exceptional heart care, we have created designated Provider Care Teams.  These Care Teams include your primary Cardiologist (physician) and Advanced Practice Providers (APPs -  Physician Assistants and Nurse Practitioners) who all work together to provide you with the care you need, when you need it.  We recommend signing up for the patient portal called "MyChart".  Sign up information is provided on this After Visit Summary.  MyChart is used to connect with patients for Virtual Visits (Telemedicine).  Patients are able to view lab/test results, encounter notes, upcoming appointments, etc.  Non-urgent messages can be sent to your provider as well.   To learn more about what you can do with MyChart, go to [email or web address].    Your next appointment:    12 months with Dr. Debara Pickett

## 2022-07-31 NOTE — Progress Notes (Signed)
OFFICE CONSULT NOTE  Chief Complaint:  Follow-up dyslipidemia  Primary Care Physician: Derinda Late, MD  HPI:  Chad Berry is a 82 y.o. male who is being seen today for the evaluation of dyslipidemia at the request of Derinda Late, MD. this is a pleasant 82 year old male followed by Dr. 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  Prior to that he had cardiac catheterization which showed mild nonobstructive coronary disease.  He is also had a CT scan of the chest which showed atherosclerosis and coronary calcification.  Carotid Dopplers of also showed some mild atherosclerosis.  He had previously had very good lipid control.  His last cholesterol in September 2022 showed total 93, triglycerides 74, HDL 42 and LDL 36.  He was on combination therapy with ezetimibe and rosuvastatin 40 mg daily.  Prior to that he had been on Livalo in the past but it apparently was not effective enough.  He then developed some muscle aches which were attributed to rosuvastatin although he had been on it for several years.  Most studies show that this is unlikely to happen however after stopping the rosuvastatin, he says his symptoms have improved.  He also stopped ezetimibe.  I am not sure if he was instructed to do both but his rebound lipid testing showed marked increases in his cholesterol.  He was therefore referred for evaluation and management of his cholesterol and to consider possibly a PCSK9 inhibitor.  07/31/2022  [name] returns today for follow-up.  He has been using Repatha without any significant side effects.  He has had more than 4 doses because he had an issue with one of the pens.  He is due for repeat lipids.  I am expecting about a 50% reduction in his cholesterol.  PMHx:  Past Medical History:  Diagnosis Date   Aortic atherosclerosis    Bradycardia    while on BB with HR to the 40s   Carotid stenosis    CAROTID DOPPLER, 12/07/2011 - Mild  arthrosclerotic changes, no high-grade stenosis   Cataract    rt. eye   Clostridium difficile infection 05/2019   Diverticulitis    Heart murmur    History of echocardiogram    Echo 9/16:  EF 60-65%, no RWMA, Gr 1 DD, holosystolic MVP of posterior leaflet, mild MR, LA upper limits of normal, normal RVF, mod TR, PASP 50 mmHg   Hypercholesteremia    Hyperlipidemia    Hypertension    Hypothyroidism    MVP (mitral valve prolapse)    a. Echo 12/07/2011 - EF-65-70%, severe mitral regurgitation, moderate-severe tricuspid regurgitation, moderate pulmonary HTN, moderate pulmonic regurgitation;  b. 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   Complex valvuloplasty including artificial Gore-tex neochords x10 with 28 mm Medtronic Simuform ring annuloplasty   Testicular hypofunction    Vitamin D deficiency     Past Surgical History:  Procedure Laterality Date   COLONOSCOPY     ETHMOIDECTOMY Left 08/13/2020   Procedure: SPENOIDECTOMY AND ETHMOIDECTOMY;  Surgeon: Rozetta Nunnery, MD;  Location: [facility];  Service: ENT;  Laterality: Left;   MITRAL VALVE REPAIR Right 09/22/2020   Procedure: MINIMALLY INVASIVE MITRAL VALVE REPAIR (MVR) USING MEDTRONIC SIMUFORM SEMI-RIGID ANNULOPLASTY RING SIZE 28MM ON PUMP;  Surgeon: Rexene Alberts, MD;  Location: [place];  Service: Open Heart Surgery;  Laterality: Right;   RIGHT/LEFT HEART CATH AND CORONARY ANGIOGRAPHY N/A 08/30/2020   Procedure: RIGHT/LEFT HEART CATH AND CORONARY ANGIOGRAPHY;  Surgeon: Lorretta Harp, MD;  Location: [place] [facility];  Service:  Cardiovascular;  Laterality: N/A;   SIGMOIDOSCOPY  02/14/2017   SINUS ENDO WITH FUSION Left 08/13/2020   Procedure: SINUS ENDO WITH FUSION;  Surgeon: Rozetta Nunnery, MD;  Location: [place];  Service: ENT;  Laterality: Left;   TEE WITHOUT CARDIOVERSION N/A 05/04/2020   Procedure: TRANSESOPHAGEAL ECHOCARDIOGRAM (TEE);  Surgeon: Donato Heinz, MD;  Location: [place];  Service: Cardiovascular;  Laterality: N/A;   TEE WITHOUT CARDIOVERSION N/A 09/22/2020   Procedure: TRANSESOPHAGEAL ECHOCARDIOGRAM (TEE);  Surgeon: Rexene Alberts, MD;  Location: [place];  Service: Open Heart Surgery;  Laterality: N/A;    FAMHx:  Family History  Problem Relation Age of Onset   Stroke Mother 11   Heart failure Father    Diverticulitis Sister    Stroke Brother 77   Heart attack Brother    Colon cancer Neg Hx    Esophageal cancer Neg Hx    Stomach cancer Neg Hx    Rectal cancer Neg Hx     SOCHx:   reports that he has quit smoking. He has quit using smokeless tobacco.  His smokeless tobacco use included chew. He reports current alcohol use. He reports that he does not use drugs.  ALLERGIES:  Allergies  Allergen Reactions   Contrast Media [Iodinated Contrast Media] Hives, Nausea And Vomiting and Cough    Pt will need a 13 hour prep before any contrast per Dr. Nelson Chimes (05/14/19)   Atorvastatin Other (See Comments)    Myalgias, joint pain   Hctz [Hydrochlorothiazide] Other (See Comments)    palpitations   Metoprolol Succinate [Metoprolol] Other (See Comments)    Bradycardia      ROS: Pertinent items noted in HPI and remainder of comprehensive ROS otherwise negative.  HOME MEDS: Current Outpatient Medications on File Prior to Visit  Medication Sig Dispense Refill   ALPRAZolam (XANAX) 0.5 MG tablet Take 0.5 mg by mouth at bedtime as needed.     amoxicillin (AMOXIL) 500 MG capsule Take 4 capsules (2,000mg ) by mouth 30-60 minutes prior to dental work or cleaning. 4 capsule 3    Ascorbic Acid (VITAMIN C) 1000 MG tablet Take 1,000 mg by mouth daily.     Cholecalciferol (VITAMIN D) 50 MCG (2000 UT) tablet Take 2,000 Units by mouth daily.     Evolocumab (REPATHA SURECLICK) [id number] MG/ML SOAJ Inject 140 mg into the skin every 14 (fourteen) days. 6 mL 3   ezetimibe (ZETIA) 10 MG tablet Take 1 tablet (10 mg total) by mouth daily. 90 tablet 3   levothyroxine (SYNTHROID) 75 MCG tablet Take 75 mcg by mouth at bedtime.     tamsulosin (FLOMAX) 0.4 MG CAPS capsule Take 0.4 mg by mouth at bedtime.     vitamin B-12 (CYANOCOBALAMIN) 100 MCG tablet Take 100 mcg by mouth daily.     losartan (COZAAR) 50 MG tablet Take 2 tablets (100 mg total) by mouth daily. (Patient taking differently: Take 50 mg by mouth daily.) 60 tablet 0   No current facility-administered  medications on file prior to visit.    LABS/IMAGING: No results found for this or any previous visit (from the past 48 hour(s)). No results found.  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   Mr. Leys seems to be tolerating Repatha without any significant side effects.  He did not get labs prior to this visit but will get them fasting this week.  He had a negative LP(a).  I am hopeful for greater than 50% reduction in LDL which should get him to below 70.  Plan follow-up with me annually or sooner as necessary.  Pixie Casino, MD,  [facility], [place] Director of the Advanced Lipid Disorders &  Cardiovascular Risk Reduction Clinic Diplomate of the American Board of Clinical Lipidology Attending Cardiologist  Direct Dial: [phone]  Fax: [phone]  Website:  [email or web address] [place] 07/31/2022, 1:48 PM

## 2022-09-06 ENCOUNTER — Ambulatory Visit (HOSPITAL_COMMUNITY): Payer: 59 | Attending: Cardiology

## 2022-09-06 DIAGNOSIS — I10 Essential (primary) hypertension: Secondary | ICD-10-CM | POA: Diagnosis not present

## 2022-09-06 DIAGNOSIS — Z9889 Other specified postprocedural states: Secondary | ICD-10-CM | POA: Diagnosis not present

## 2022-09-06 LAB — ECHOCARDIOGRAM COMPLETE
Area-P 1/2: 2.5 cm2
MV VTI: 1.94 cm2
P 1/2 time: 557 msec
S' Lateral: 3.6 cm

## 2022-09-08 LAB — NMR, LIPOPROFILE
Cholesterol, Total: 112 mg/dL (ref 100–199)
HDL Particle Number: 28.1 umol/L — ABNORMAL LOW (ref >=30.5)
HDL-C: 46 mg/dL (ref >39)
LDL Particle Number: 539 nmol/L (ref <1000)
LDL Size: 20.9 nm (ref >20.5)
LDL-C (NIH Calc): 45 mg/dL (ref 0–99)
LP-IR Score: 34 (ref <=45)
Small LDL Particle Number: <90 nmol/L (ref <=527)
Triglycerides: 115 mg/dL (ref 0–149)

## 2022-09-26 ENCOUNTER — Encounter (HOSPITAL_BASED_OUTPATIENT_CLINIC_OR_DEPARTMENT_OTHER): Payer: 59

## 2022-09-28 ENCOUNTER — Ambulatory Visit (HOSPITAL_BASED_OUTPATIENT_CLINIC_OR_DEPARTMENT_OTHER): Payer: 59 | Admitting: Pulmonary Disease

## 2022-11-06 ENCOUNTER — Ambulatory Visit (HOSPITAL_BASED_OUTPATIENT_CLINIC_OR_DEPARTMENT_OTHER): Payer: 59 | Admitting: Primary Care

## 2022-11-06 ENCOUNTER — Encounter (HOSPITAL_BASED_OUTPATIENT_CLINIC_OR_DEPARTMENT_OTHER): Payer: 59

## 2022-11-07 ENCOUNTER — Telehealth (HOSPITAL_BASED_OUTPATIENT_CLINIC_OR_DEPARTMENT_OTHER): Payer: Self-pay | Admitting: Pulmonary Disease

## 2022-11-07 NOTE — Telephone Encounter (Signed)
Pt. Was called on recalled and stated he no longer wanted to F/u with [place]

## 2022-11-13 ENCOUNTER — Ambulatory Visit (HOSPITAL_COMMUNITY)
Admission: RE | Admit: 2022-11-13 | Discharge: 2022-11-13 | Disposition: A | Discharge status: Home / Self Care | Payer: 59 | Source: Ambulatory Visit | Attending: Pulmonary Disease | Admitting: Pulmonary Disease

## 2022-11-13 DIAGNOSIS — R0609 Other forms of dyspnea: Secondary | ICD-10-CM | POA: Diagnosis present

## 2022-11-13 LAB — PULMONARY FUNCTION TEST
DL/VA % pred: 96 %
DL/VA: 3.74 ml/min/mmHg/L
DLCO unc % pred: 91 %
DLCO unc: 23.18 ml/min/mmHg
FEF 25-75 Post: 4.09 L/sec
FEF 25-75 Pre: 3.1 L/sec
FEF2575-%Change-Post: 31 %
FEF2575-%Pred-Post: 192 %
FEF2575-%Pred-Pre: 146 %
FEV1-%Change-Post: 5 %
FEV1-%Pred-Post: 120 %
FEV1-%Pred-Pre: 114 %
FEV1-Post: 3.67 L
FEV1-Pre: 3.48 L
FEV1FVC-%Change-Post: 7 %
FEV1FVC-%Pred-Pre: 110 %
FEV6-%Change-Post: 0 %
FEV6-%Pred-Post: 108 %
FEV6-%Pred-Pre: 109 %
FEV6-Post: 4.29 L
FEV6-Pre: 4.33 L
FEV6FVC-%Change-Post: 1 %
FEV6FVC-%Pred-Post: 106 %
FEV6FVC-%Pred-Pre: 105 %
FVC-%Change-Post: -2 %
FVC-%Pred-Post: 101 %
FVC-%Pred-Pre: 103 %
FVC-Post: 4.29 L
FVC-Pre: 4.39 L
Post FEV1/FVC ratio: 86 %
Post FEV6/FVC ratio: 100 %
Pre FEV1/FVC ratio: 79 %
Pre FEV6/FVC Ratio: 99 %
RV % pred: 67 %
RV: 1.83 L
TLC % pred: 89 %
TLC: 6.49 L

## 2022-11-13 MED ORDER — ALBUTEROL SULFATE (2.5 MG/3ML) 0.083% IN NEBU
2.5000 mg | INHALATION_SOLUTION | Freq: Once | RESPIRATORY_TRACT | Status: AC
  Administered 2022-11-13: 2.5 mg via RESPIRATORY_TRACT

## 2022-11-28 ENCOUNTER — Ambulatory Visit (HOSPITAL_BASED_OUTPATIENT_CLINIC_OR_DEPARTMENT_OTHER): Payer: 59 | Admitting: Primary Care

## 2022-11-28 ENCOUNTER — Encounter (HOSPITAL_BASED_OUTPATIENT_CLINIC_OR_DEPARTMENT_OTHER): Payer: Self-pay | Admitting: Primary Care

## 2022-11-28 VITALS — BP 126/84 | HR 65 | Resp 13 | Ht 70.0 in | Wt 226.6 lb

## 2022-11-28 DIAGNOSIS — Z9889 Other specified postprocedural states: Secondary | ICD-10-CM | POA: Diagnosis not present

## 2022-11-28 DIAGNOSIS — K219 Gastro-esophageal reflux disease without esophagitis: Secondary | ICD-10-CM | POA: Insufficient documentation

## 2022-11-28 DIAGNOSIS — R0609 Other forms of dyspnea: Secondary | ICD-10-CM | POA: Diagnosis not present

## 2022-11-28 MED ORDER — FAMOTIDINE 40 MG PO TABS: 40.0000 mg | ORAL_TABLET | Freq: Every day | ORAL | 1 refills | Status: DC

## 2022-11-28 NOTE — Progress Notes (Signed)
@Patient  ID: Chad Berry, male    DOB: 1940-09-21, 82 y.o.   MRN: [id number]  Chief Complaint  Patient presents with   Follow-up    Discuss results     Referring provider: Mosetta Putt, MD  HPI: 82 year old male, former light smoker (5+ pack year hx).  Past medical history significant for hypertension, A-fib, severe mitral valve regurgitation s/p minimally invasive replace May 2022, DVT, carotid stenosis, hyperlipidemia.  11/28/2022 Patient presents today to review pulmonary function testing.  Patient was seen by Dr. Vassie Loll on 01/30/2022 for consult due to dyspnea.  Patient reports having shortness of breath with exertion for the last 6 months.  He underwent mitral valve repair in May 2022, postop course was complicated by A-fib and DVT/PE.  He was on Eliquis until March 2023.  Patient recovered well with cardiac rehab and was able to walk 45 minutes on the treadmill.  His dyspnea symptoms started to decline after getting COVID in December 2022.  No clear cause of dyspnea has been identified.  History does not sound like asthma.  Minimal smoking history, doubt COPD.  He has pulmonary hypertension on echocardiogram which is likely residual to mitral regurgitation and not felt to be severe enough to be causing dyspnea symptoms.  CT chest without contrast shows minimal scarring, unlikely related to COVID. VQ scan in October 2023 showed low probability of pulmonary embolism. Pulmonary function testing on 11/13/2022 showed normal pulmonary function/FEV1 3.67 (120%), ratio 86.  No bronchodilator response.  Shortness of breath is the same. He has good days and bad. He gets winded with inclines/steps or if straining himself. Reports change to his ability to smell that comes and goes since getting covid, this seems to correlate with his breathing. He can not eat spicy things. He gets reflux at night. He does not take any over the counter reflux medication. He does want to return to rehab, previously went to  hospital in [place].   Significant tests/ events reviewed   10/2020 VQ scan high probability Echo 08/2021 normal LVEF, RVSP 40   CT chest without contrast 08/2021 right upper lobe scarring  11/13/2022 PFTs>> FVC 4.29 (101%), FEV1 3.67 (120%), ratio 86, TLC 89%, DLCO 23.18 (91%) Normal pulmonary function testing without bronchodilator response.  Normal diffusion capacity when correlated for lung volumes    Allergies  Allergen Reactions   Contrast Media [Iodinated Contrast Media] Hives, Nausea And Vomiting and Cough    Pt will need a 13 hour prep before any contrast per Dr. Paulina Fusi (05/14/19)   Atorvastatin Other (See Comments)    Myalgias, joint pain   Hctz [Hydrochlorothiazide] Other (See Comments)    palpitations   Metoprolol Succinate [Metoprolol] Other (See Comments)    Bradycardia      Immunization History  Administered Date(s) Administered   Influenza, High Dose Seasonal PF 04/23/2017   Influenza, Seasonal, Injecte, Preservative Fre 01/22/2013   Influenza,inj,Quad PF,6+ Mos 03/06/2016   Influenza,inj,quad, With Preservative 04/01/2014   Pneumococcal Conjugate-13 01/11/2016, 06/29/2016   Pneumococcal Polysaccharide-23 12/12/2007, 01/11/2015   Td (Adult),5 Lf Tetanus Toxid, Preservative Free 05/27/2008   Tdap 08/22/2016   Zoster Recombinant(Shingrix) 11/14/2017   Zoster, Live 02/28/2012    Past Medical History:  Diagnosis Date   Aortic atherosclerosis (HCC)    Bradycardia    while on BB with HR to the 40s   Carotid stenosis    CAROTID DOPPLER, 12/07/2011 - Mild arthrosclerotic changes, no high-grade stenosis   Cataract    rt. eye   Clostridium  difficile infection 05/2019   Diverticulitis    Heart murmur    History of echocardiogram    Echo 9/16:  EF 60-65%, no RWMA, Gr 1 DD, holosystolic MVP of posterior leaflet, mild MR, LA upper limits of normal, normal RVF, mod TR, PASP 50 mmHg   Hypercholesteremia    Hyperlipidemia    Hypertension    Hypothyroidism     MVP (mitral valve prolapse)    a. Echo 12/07/2011 - EF-65-70%, severe mitral regurgitation, moderate-severe tricuspid regurgitation, moderate pulmonary HTN, moderate pulmonic regurgitation;  b. 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     amoxicillin (AMOXIL) 500 MG capsule Take 4 capsules (2,000mg ) by mouth 30-60 minutes prior to dental work or cleaning. 4 capsule 3   Ascorbic Acid (VITAMIN C) 1000 MG tablet Take 1,000 mg by mouth daily.     Cholecalciferol (VITAMIN D) 50 MCG (2000 UT) tablet Take 2,000 Units by mouth daily.     Evolocumab (REPATHA SURECLICK) 140 MG/ML SOAJ Inject 140 mg into the skin every 14 (fourteen) days. 6 mL 3   ezetimibe (ZETIA) 10 MG tablet Take 1 tablet (10 mg total) by mouth daily. 90 tablet 3   levothyroxine (SYNTHROID) 75 MCG tablet Take 75 mcg by mouth at bedtime.     tamsulosin (FLOMAX) 0.4 MG CAPS capsule Take 0.4 mg by mouth at bedtime.     vitamin B-12 (CYANOCOBALAMIN) 100 MCG tablet Take 100 mcg by mouth daily.     losartan (COZAAR) 50 MG tablet Take 2 tablets (100 mg total) by mouth  daily. (Patient taking differently: Take 50 mg by mouth daily.) 60 tablet 0   No facility-administered medications prior to visit.   Review of Systems  Review of Systems  Constitutional: Negative.   HENT: Negative.    Respiratory:  Positive for cough and shortness of breath. Negative for chest tightness and wheezing.   Cardiovascular: Negative.   Psychiatric/Behavioral: Negative.      Physical Exam  BP 126/84   Pulse 65   Resp 13   Ht 5\' 10"  (1.778 m)   Wt 226 lb 9.6 oz (102.8 kg)   SpO2 98%   BMI 32.51 kg/m  Physical Exam Constitutional:      Appearance: Normal appearance. He is obese. He is not ill-appearing.  HENT:     Head: Normocephalic and atraumatic.     Mouth/Throat:     Mouth: Mucous membranes are moist.     Pharynx: Oropharynx is clear.  Cardiovascular:     Rate and Rhythm: Normal rate and regular rhythm.  Pulmonary:     Effort:  Pulmonary effort is normal.     Breath sounds: Normal breath sounds. No wheezing, rhonchi or rales.  Abdominal:     General: There is distension.  Musculoskeletal:        General: Normal range of motion.  Skin:    General: Skin is warm and dry.  Neurological:     General: No focal deficit present.     Mental Status: He is alert and oriented to person, place, and time. Mental status is at baseline.  Psychiatric:        Mood and Affect: Mood normal.        Behavior: Behavior normal.        Thought Content: Thought content normal.        Judgment: Judgment normal.      Lab Results:  CBC    Component Value Date/Time   WBC 5.8 11/10/2020 0926   RBC 4.69 11/10/2020 0926   HGB 14.7 11/10/2020 0926   HGB 14.0 08/25/2020 0954   HCT 43.2 11/10/2020 0926   HCT 40.0 08/25/2020 0954   PLT 189 11/10/2020 0926   PLT 157 08/25/2020 0954   MCV 92.1 11/10/2020 0926   MCV 95 08/25/2020 0954   MCH 31.3 11/10/2020 0926   MCHC 34.0 11/10/2020 0926   RDW 13.9 11/10/2020 0926   RDW 12.5 08/25/2020 0954   LYMPHSABS 1.0 11/08/2020 1302    MONOABS 0.6 11/08/2020 1302   EOSABS 0.1 11/08/2020 1302   BASOSABS 0.0 11/08/2020 1302    BMET    Component Value Date/Time   NA 138 11/10/2020 0926   NA 141 08/25/2020 0954   K 4.2 11/10/2020 0926   CL 104 11/10/2020 0926   CO2 23 11/10/2020 0926   GLUCOSE 106 (H) 11/10/2020 0926   BUN 12 11/10/2020 0926   BUN 16 08/25/2020 0954   CREATININE 1.18 11/10/2020 0926   CALCIUM 9.7 11/10/2020 0926   GFRNONAA >60 11/10/2020 0926   GFRAA 71 05/03/2020 0959    BNP No results found for: "BNP"  ProBNP No results found for: "PROBNP"  Imaging: No results found.   Assessment & Plan:   DOE (dyspnea on exertion) - No pulmonary cause identified to explain patient's dyspnea symptoms, reflux and deconditioning could be contributing - Pulmonary function testing was normal - VQ scan showed low probability of pulmonary embolism - CT chest showed minimal scarring unlikely related to COVID - Start Famotidine 40mg  bedtime for 4 weeks for reflux symptoms, if not better refer to GI  - Recommend returning to cardiopulmonary rehab to work on physical conditioning   Acid reflux - Patient is not able to tolerate spicy food, has an occasional cough  - Trial H2 blocker at bedtime - Encourage weight loss  Glenford Bayley, NP 11/28/2022

## 2022-11-28 NOTE — Patient Instructions (Addendum)
- No pulmonary cause identified at to explain symptoms/ reflux and deconditioning could be contributing - Pulmonary function testing was normal - VQ scan showed low probability of pulmonary embolism - CT chest showed minimal scarring unlikely related to COVID - Start Famotidine 40mg  bedtime for 4 weeks>> if not better lets refer you to GI  - Recommend returning to cardiopulmonary rehab to work on conditioning (ordered placed, [facility] or [facility])   Rx: Famotidine (sent)  Referral: Cardiac rehab (ordered)  Follow-up: 6 months with Dr. Vassie Loll or sooner if needed    Food Choices for Gastroesophageal Reflux Disease, Adult When you have gastroesophageal reflux disease (GERD), the foods you eat and your eating habits are very important. Choosing the right foods can help ease your discomfort. Think about working with a food expert (dietitian) to help you make good choices. What are tips for following this plan? Reading food labels Look for foods that are low in saturated fat. Foods that may help with your symptoms include: Foods that have less than 5% of daily value (DV) of fat. Foods that have 0 grams of trans fat. Cooking Do not fry your food. Cook your food by baking, steaming, grilling, or broiling. These are all methods that do not need a lot of fat for cooking. To add flavor, try to use herbs that are low in spice and acidity. Meal planning  Choose healthy foods that are low in fat, such as: Fruits and vegetables. Whole grains. Low-fat dairy products. Lean meats, fish, and poultry. Eat small meals often instead of eating 3 large meals each day. Eat your meals slowly in a place where you are relaxed. Avoid bending over or lying down until 2-3 hours after eating. Limit high-fat foods such as fatty meats or fried foods. Limit your intake of fatty foods, such as oils, butter, and shortening. Avoid the following as told by your doctor: Foods that cause symptoms. These may be  different for different people. Keep a food diary to keep track of foods that cause symptoms. Alcohol. Drinking a lot of liquid with meals. Eating meals during the 2-3 hours before bed. Lifestyle Stay at a healthy weight. Ask your doctor what weight is healthy for you. If you need to lose weight, work with your doctor to do so safely. Exercise for at least 30 minutes on 5 or more days each week, or as told by your doctor. Wear loose-fitting clothes. Do not smoke or use any products that contain nicotine or tobacco. If you need help quitting, ask your doctor. Sleep with the head of your bed higher than your feet. Use a wedge under the mattress or blocks under the bed frame to raise the head of the bed. Chew sugar-free gum after meals. What foods should eat?  Eat a healthy, well-balanced diet of fruits, vegetables, whole grains, low-fat dairy products, lean meats, fish, and poultry. Each person is different. Foods that may cause symptoms in one person may not cause any symptoms in another person. Work with your doctor to find foods that are safe for you. The items listed above may not be a complete list of what you can eat and drink. Contact a food expert for more options. What foods should I avoid? Limiting some of these foods may help in managing the symptoms of GERD. Everyone is different. Talk with a food expert or your doctor to help you find the exact foods to avoid, if any. Fruits Any fruits prepared with added fat. Any fruits  that cause symptoms. For some people, this may include citrus fruits, such as oranges, grapefruit, pineapple, and lemons. Vegetables Deep-fried vegetables. [place] fries. Any vegetables prepared with added fat. Any vegetables that cause symptoms. For some people, this may include tomatoes and tomato products, chili peppers, onions and garlic, and horseradish. Grains Pastries or quick breads with added fat. Meats and other proteins High-fat meats, such as fatty beef  or pork, hot dogs, ribs, ham, sausage, salami, and bacon. Fried meat or protein, including fried fish and fried chicken. Nuts and nut butters, in large amounts. Dairy Whole milk and chocolate milk. Sour cream. Cream. Ice cream. Cream cheese. Milkshakes. Fats and oils Butter. Margarine. Shortening. Ghee. Beverages Coffee and tea, with or without caffeine. Carbonated beverages. Sodas. Energy drinks. Fruit juice made with acidic fruits, such as orange or grapefruit. Tomato juice. Alcoholic drinks. Sweets and desserts Chocolate and cocoa. Donuts. Seasonings and condiments Pepper. Peppermint and spearmint. Added salt. Any condiments, herbs, or seasonings that cause symptoms. For some people, this may include curry, hot sauce, or vinegar-based salad dressings. The items listed above may not be a complete list of what you should not eat and drink. Contact a food expert for more options. Questions to ask your doctor Diet and lifestyle changes are often the first steps that are taken to manage symptoms of GERD. If diet and lifestyle changes do not help, talk with your doctor about taking medicines. Where to find more information [employer]: aboutgerd.org Summary When you have GERD, food and lifestyle choices are very important in easing your symptoms. Eat small meals often instead of 3 large meals a day. Eat your meals slowly and in a place where you are relaxed. Avoid bending over or lying down until 2-3 hours after eating. Limit high-fat foods such as fatty meats or fried foods. This information is not intended to replace advice given to you by your health care provider. Make sure you discuss any questions you have with your health care provider. Document Revised: 10/27/2019 Document Reviewed: 10/27/2019 Elsevier Patient Education  2024 [employer].

## 2022-11-28 NOTE — Assessment & Plan Note (Addendum)
-   No pulmonary cause identified to explain patient's dyspnea symptoms, reflux and deconditioning could be contributing - Pulmonary function testing was normal - VQ scan showed low probability of pulmonary embolism - CT chest showed minimal scarring unlikely related to COVID - Start Famotidine 40mg  bedtime for 4 weeks for reflux symptoms, if not better refer to GI  - Recommend returning to cardiopulmonary rehab to work on physical conditioning

## 2022-11-28 NOTE — Assessment & Plan Note (Signed)
-   Patient is not able to tolerate spicy food, has an occasional cough  - Trial H2 blocker at bedtime - Encourage weight loss

## 2022-12-19 ENCOUNTER — Encounter: Payer: Self-pay | Admitting: Cardiovascular Disease

## 2022-12-19 ENCOUNTER — Ambulatory Visit: Payer: 59 | Attending: Cardiovascular Disease | Admitting: Cardiovascular Disease

## 2022-12-19 VITALS — BP 142/84 | HR 58 | Ht 71.0 in | Wt 229.8 lb

## 2022-12-19 DIAGNOSIS — I341 Nonrheumatic mitral (valve) prolapse: Secondary | ICD-10-CM | POA: Diagnosis not present

## 2022-12-19 DIAGNOSIS — I10 Essential (primary) hypertension: Secondary | ICD-10-CM

## 2022-12-19 DIAGNOSIS — I340 Nonrheumatic mitral (valve) insufficiency: Secondary | ICD-10-CM

## 2022-12-19 DIAGNOSIS — E782 Mixed hyperlipidemia: Secondary | ICD-10-CM | POA: Diagnosis not present

## 2022-12-19 NOTE — Progress Notes (Signed)
12/19/2022 [name]   Apr 21, 1941  [id number]  Primary Physician Mosetta Putt, MD Primary Cardiologist: Runell Gess MD Nicholes Calamity, [place]  HPI:  Chad Berry is a 82 y.o.  moderately overweight, married Caucasian male, father of 2, grandfather to 2 grandchildren whose son Chad Berry is also a patient of mine, as is his wife. I last saw him 11/30/2021. He was worked up for palpitations and had an echo that showed severe MR with mild mitral valve prolapse and mild LV size and function. His other problems include hypertension and hyperlipidemia. He lives an active lifestyle and is actually heading out to [place] to go coyote hunting for a week. He has changed his diet and has lost 15 pounds. He has cut out caffeine and alcohol. He has had 2 negative sleep studies. He is otherwise asymptomatic. Since I saw him several years ago he has seen Tereso Newcomer in the office for evaluation of palpitations. A 2-D echo revealed normal LV function with holosystolic mitral valve prolapse, mild MR and moderate TR. A 48 hour Holter monitor showed PVCs, short runs of nonsustained ventricular tachycardia and short runs of SVT. Since stopping his diuretic his palpitations have improved.   I did refer him to Dr. Ladona Ridgel who recommended conservative therapy noting he had PACs and PVCs.  He recommended avoidance of caffeine and alcohol.  He is referred back by Dr. Duaine Dredge for follow-up of his mitral regurgitation because of a murmur which he thought was louder than he had remembered.  Repeat 2D echo performed 11/14/2017 revealed moderate to severe MR with posterior leaflet prolapse, normal LV size and function.  He is completely asymptomatic.  He purchased his ZR1 Corvette and recently rode [place] 150 mph.   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    He does complain of increasing dyspnea over the last  6 to 12 months.  He had a transesophageal echo performed by Dr. Bjorn Pippin 05/04/2020 revealing a flail P2 segment with severe MR.  He wishes to proceed with mini mitral repair with Dr. Cornelius Moras.  I performed right and left heart cath revealing normal coronary arteries and severe MR.  He underwent mitral valve repair minimally invasively by Dr. Cornelius Moras 09/22/2020 and was discharged several days later.  He was readmitted 10/22/2020 with chest pain was found to have a DVT on the right with a pulmonary embolus.  His Coumadin was subtherapeutic which she was originally begun on because of perioperative PAF and he was transitioned to Eliquis oral anticoagulation.  He is currently participating cardiac rehab.  I referred him to Dr. Ladona Ridgel because of his PAF.  A Zio patch did not show any recurrent arrhythmias.  He remains on Eliquis.  He is still participating cardiac rehab.  He does 40 minutes on a bicycle, treadmill and stair stepper without symptoms.  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  He is very active and says that his energy level is back up to his preoperative level.  A follow-up 2D echo performed 12/06/2020 revealed normal LV systolic function with a well-functioning mitral valve and no regurgitation.  Lower extremity venous Doppler studies recently performed 02/01/2021 showed resolution of his right lower extremity DVT.  Since I saw him in the office a year ago he continues to do well.  He still is working on his 450 acre  farm implanted 150 acres of corn last year.  He continues to workout.  He denies chest pain but does get mildly dyspneic when walking up 30 stairs.  His most recent 2D echo performed 09/06/2022 revealed normal LV systolic function with no evidence of MR.   Current Meds  Medication Sig   ALPRAZolam (XANAX) 0.5 MG tablet Take 0.5 mg by mouth at bedtime as needed.   amoxicillin (AMOXIL) 500 MG capsule Take 4 capsules (2,000mg ) by mouth 30-60 minutes prior to dental work or cleaning.    Ascorbic Acid (VITAMIN C) 1000 MG tablet Take 1,000 mg by mouth daily.   Cholecalciferol (VITAMIN D) 50 MCG (2000 UT) tablet Take 2,000 Units by mouth daily.   Evolocumab (REPATHA SURECLICK) 140 MG/ML SOAJ Inject 140 mg into the skin every 14 (fourteen) days.   ezetimibe (ZETIA) 10 MG tablet Take 1 tablet (10 mg total) by mouth daily.   famotidine (PEPCID) 40 MG tablet Take 1 tablet (40 mg total) by mouth at bedtime.   levothyroxine (SYNTHROID) 75 MCG tablet Take 75 mcg by mouth at bedtime.   tamsulosin (FLOMAX) 0.4 MG CAPS capsule Take 0.4 mg by mouth at bedtime.   vitamin B-12 (CYANOCOBALAMIN) 100 MCG tablet Take 100 mcg by mouth daily.     Allergies  Allergen Reactions   Contrast Media [Iodinated Contrast Media] Hives, Nausea And Vomiting and Cough    Pt will need a 13 hour prep before any contrast per Dr. Paulina Fusi (05/14/19)   Atorvastatin Other (See Comments)    Myalgias, joint pain   Hctz [Hydrochlorothiazide] Other (See Comments)    palpitations   Metoprolol Succinate [Metoprolol] Other (See Comments)    Bradycardia      Social History   Socioeconomic History   Marital status: Married    Spouse name: Not on file   Number of children: Not on file   Years of education: Not on file   Highest education level: Not on file  Occupational History   Not on file  Tobacco Use   Smoking status: Former   Smokeless tobacco: Former    Types: Engineer, drilling   Vaping status: Never Used  Substance and Sexual Activity   Alcohol use: Yes    Alcohol/week: 0.0 standard drinks of alcohol    Comment: occasionally   Drug use: No   Sexual activity: Not on file  Other Topics Concern   Not on file  Social History Narrative   Married, owner of Facilities manager company   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 [place]   Second home [place], enjoys fihing, cars (Has ZR1)   Social Determinants of Health   Financial Resource Strain: Not on file  Food Insecurity: Not on file  Transportation Needs: Not  on file  Physical Activity: Not on file  Stress: Not on file  Social Connections: Unknown (09/09/2021)   Received from [facility]   Social Network    Social Network: Not on file  Intimate Partner Violence: Unknown (08/02/2021)   Received from [facility], [facility]   HITS    Physically Hurt: Not on file    Insult or Talk Down To: Not on file    Threaten Physical Harm: Not on file    Scream or Curse: Not on file     Review of Systems: General: negative for chills, fever, night sweats or weight changes.  Cardiovascular: negative for chest pain, dyspnea on exertion, edema, orthopnea, palpitations, paroxysmal nocturnal dyspnea or shortness of  breath Dermatological: negative for rash Respiratory: negative for cough or wheezing Urologic: negative for hematuria Abdominal: negative for nausea, vomiting, diarrhea, bright red blood per rectum, melena, or hematemesis Neurologic: negative for visual changes, syncope, or dizziness All other systems reviewed and are otherwise negative except as noted above.    Blood pressure (!) 142/84, pulse (!) 58, height 5\' 11"  (1.803 m), weight 229 lb 12.8 oz (104.2 kg), SpO2 97%.  General appearance: alert and no distress Neck: no adenopathy, no carotid bruit, no JVD, supple, symmetrical, trachea midline, and thyroid not enlarged, symmetric, no tenderness/mass/nodules Lungs: clear to auscultation bilaterally Heart: Regular rate and rhythm without murmurs gallops rubs or clicks. Extremities: extremities normal, atraumatic, no cyanosis or edema Pulses: 2+ and symmetric Skin: Skin color, texture, turgor normal. No rashes or lesions Neurologic: Grossly normal  EKG EKG Interpretation Date/Time:  Tuesday December 19 2022 10:43:48 EDT Ventricular Rate:  58 PR Interval:  216 QRS Duration:  140 QT Interval:  438 QTC Calculation: 429 R Axis:   38  Text Interpretation: Sinus bradycardia with 1st degree A-V block Non-specific  intra-ventricular conduction block Minimal voltage criteria for LVH, may be normal variant ( Cornell product ) Nonspecific T wave abnormality When compared with ECG of 10-Nov-2020 09:20, PREVIOUS ECG IS PRESENT Confirmed by Nanetta Batty [phone]) on 12/19/2022 11:28:28 AM    ASSESSMENT AND PLAN:   Essential hypertension History of essential hypertension blood pressure measured today at 142/84.  He is on losartan.  Hyperlipidemia History of hyperlipidemia on Repatha and Zetia with lipid profile performed 09/06/2022 revealing total cholesterol 112, LDL 45 and HDL 46.  Mitral regurgitation History of severe mitral vegetation secondary to mitral valve prolapse status post minimally invasive mitral valve repair by Dr. Cornelius Moras 09/22/2020.  He had normal coronary arteries by cath prior to that.  His most recent 2D echo performed 09/06/2022 revealed normal LV systolic function with no evidence of much regurgitation.  This will be followed on an annual basis.     Runell Gess MD FACP,FACC,FAHA, [facility] 12/19/2022 11:42 AM

## 2022-12-19 NOTE — Assessment & Plan Note (Signed)
History of essential hypertension blood pressure measured today at 142/84.  He is on losartan.

## 2022-12-19 NOTE — Assessment & Plan Note (Signed)
History of severe mitral vegetation secondary to mitral valve prolapse status post minimally invasive mitral valve repair by Dr. Cornelius Moras 09/22/2020.  He had normal coronary arteries by cath prior to that.  His most recent 2D echo performed 09/06/2022 revealed normal LV systolic function with no evidence of much regurgitation.  This will be followed on an annual basis.

## 2022-12-19 NOTE — Patient Instructions (Signed)
Medication Instructions:  No changes *If you need a refill on your cardiac medications before your next appointment, please call your pharmacy*  Testing/Procedures: Your physician has requested that you have an echocardiogram IN ONE YEAR- prior to appointment with Dr Allyson Sabal. Echocardiography is a painless test that uses sound waves to create images of your heart. It provides your doctor with information about the size and shape of your heart and how well your heart's chambers and valves are working. This procedure takes approximately one hour. There are no restrictions for this procedure. Please do NOT wear cologne, perfume, aftershave, or lotions (deodorant is allowed). Please arrive 15 minutes prior to your appointment time.    Follow-Up: At [facility], you and your health needs are our priority.  As part of our continuing mission to provide you with exceptional heart care, we have created designated Provider Care Teams.  These Care Teams include your primary Cardiologist (physician) and Advanced Practice Providers (APPs -  Physician Assistants and Nurse Practitioners) who all work together to provide you with the care you need, when you need it.  We recommend signing up for the patient portal called "MyChart".  Sign up information is provided on this After Visit Summary.  MyChart is used to connect with patients for Virtual Visits (Telemedicine).  Patients are able to view lab/test results, encounter notes, upcoming appointments, etc.  Non-urgent messages can be sent to your provider as well.   To learn more about what you can do with MyChart, go to [email or web address].    Your next appointment:    Chad Person, NP in 6 months  Dr Allyson Sabal in 1 yr

## 2022-12-19 NOTE — Assessment & Plan Note (Signed)
History of hyperlipidemia on Repatha and Zetia with lipid profile performed 09/06/2022 revealing total cholesterol 112, LDL 45 and HDL 46.

## 2022-12-28 ENCOUNTER — Telehealth: Payer: Self-pay | Admitting: Primary Care

## 2022-12-28 DIAGNOSIS — Z9889 Other specified postprocedural states: Secondary | ICD-10-CM

## 2022-12-28 NOTE — Telephone Encounter (Signed)
Order for rehab was placed under NP this has to go under a MD please

## 2023-01-03 ENCOUNTER — Other Ambulatory Visit (HOSPITAL_COMMUNITY): Payer: Self-pay | Admitting: Pulmonary Disease

## 2023-01-03 DIAGNOSIS — R0600 Dyspnea, unspecified: Secondary | ICD-10-CM

## 2023-01-03 NOTE — Telephone Encounter (Signed)
Noted  

## 2023-01-03 NOTE — Telephone Encounter (Signed)
Updated order placed.

## 2023-01-05 ENCOUNTER — Encounter (HOSPITAL_COMMUNITY)
Admission: RE | Admit: 2023-01-05 | Discharge: 2023-01-05 | Disposition: A | Discharge status: Home / Self Care | Payer: 59 | Source: Ambulatory Visit | Attending: Pulmonary Disease | Admitting: Pulmonary Disease

## 2023-01-05 ENCOUNTER — Encounter (HOSPITAL_COMMUNITY): Payer: Self-pay

## 2023-01-05 DIAGNOSIS — R0600 Dyspnea, unspecified: Secondary | ICD-10-CM | POA: Insufficient documentation

## 2023-01-10 ENCOUNTER — Encounter (HOSPITAL_COMMUNITY): Payer: Self-pay

## 2023-01-10 ENCOUNTER — Encounter (HOSPITAL_COMMUNITY)
Admission: RE | Admit: 2023-01-10 | Discharge: 2023-01-10 | Disposition: A | Discharge status: Home / Self Care | Payer: 59 | Source: Ambulatory Visit | Attending: Pulmonary Disease | Admitting: Pulmonary Disease

## 2023-01-10 VITALS — BP 140/80 | HR 65 | Ht 70.0 in | Wt 229.1 lb

## 2023-01-10 DIAGNOSIS — R0600 Dyspnea, unspecified: Secondary | ICD-10-CM

## 2023-01-10 NOTE — Progress Notes (Addendum)
Pulmonary Individual Treatment Plan  Patient Details  Name: Chad Berry MRN: [id number] Date of Birth: 1941-01-12 Referring Provider:   Flowsheet Row PULMONARY REHAB OTHER RESP ORIENTATION from 01/10/2023 in [facility] CARDIAC REHABILITATION  Referring Provider Dr. Vassie Loll       Initial Encounter Date:  Flowsheet Row PULMONARY REHAB OTHER RESP ORIENTATION from 01/10/2023 in [facility] CARDIAC REHABILITATION  Date 01/10/23       Visit Diagnosis: Dyspnea, unspecified type  Patient's Home Medications on Admission:   Current Outpatient Medications:    ALPRAZolam (XANAX) 0.5 MG tablet, Take 0.5 mg by mouth at bedtime as needed. (Patient not taking: Reported on 01/05/2023), Disp: , Rfl:    amoxicillin (AMOXIL) 500 MG capsule, Take 4 capsules (2,000mg ) by mouth 30-60 minutes prior to dental work or cleaning., Disp: 4 capsule, Rfl: 3   Ascorbic Acid (VITAMIN C) 1000 MG tablet, Take 1,000 mg by mouth daily. (Patient not taking: Reported on 01/05/2023), Disp: , Rfl:    Cholecalciferol (VITAMIN D) 50 MCG (2000 UT) tablet, Take 2,000 Units by mouth daily., Disp: , Rfl:    Evolocumab (REPATHA SURECLICK) 140 MG/ML SOAJ, Inject 140 mg into the skin every 14 (fourteen) days., Disp: 6 mL, Rfl: 3   ezetimibe (ZETIA) 10 MG tablet, Take 1 tablet (10 mg total) by mouth daily., Disp: 90 tablet, Rfl: 3   famotidine (PEPCID) 40 MG tablet, Take 1 tablet (40 mg total) by mouth at bedtime., Disp: 30 tablet, Rfl: 1   levothyroxine (SYNTHROID) 75 MCG tablet, Take 75 mcg by mouth at bedtime., Disp: , Rfl:    losartan (COZAAR) 50 MG tablet, Take 50 mg by mouth daily., Disp: , Rfl:    tamsulosin (FLOMAX) 0.4 MG CAPS capsule, Take 0.4 mg by mouth at bedtime., Disp: , Rfl:    vitamin B-12 (CYANOCOBALAMIN) 100 MCG tablet, Take 100 mcg by mouth daily., Disp: , Rfl:   Past Medical History: Past Medical History:  Diagnosis Date   Aortic atherosclerosis (HCC)    Bradycardia    while on BB with HR to the 40s   Carotid  stenosis    CAROTID DOPPLER, 12/07/2011 - Mild arthrosclerotic changes, no high-grade stenosis   Cataract    rt. eye   Clostridium difficile infection 05/2019   Diverticulitis    Heart murmur    History of echocardiogram    Echo 9/16:  EF 60-65%, no RWMA, Gr 1 DD, holosystolic MVP of posterior leaflet, mild MR, LA upper limits of normal, normal RVF, mod TR, PASP 50 mmHg   Hypercholesteremia    Hyperlipidemia    Hypertension    Hypothyroidism    MVP (mitral valve prolapse)    a. Echo 12/07/2011 - EF-65-70%, severe mitral regurgitation, moderate-severe tricuspid regurgitation, moderate pulmonary HTN, moderate pulmonic regurgitation;  b. 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3  47.5  43.6  45.5   Bicarbonate 20.0 - 28.0 mmol/L 24.7  25.7  25.5  23.6  19.1  21.1  22.8  24.0  26.9  24.8  25.3  24.7   TCO2 22 - 32 mmol/L 26  27  27   - 20  23  24  22  25  24  28  26  26  27  23  26  26     Acid-base deficit 0.0 - 2.0 mmol/L 1.0  1.0  0.8  5.0  6.0  3.0  1.0  1.0   O2 Saturation % 95.0  77.0  76.0  97.9  99.0  94.0  99.0  100.0  100.0  87.0  100.0  100.0     Details       Multiple values from one day are sorted in reverse-chronological order         Capillary Blood Glucose: Lab Results  Component Value Date   GLUCAP 157 (H) 09/28/2020   GLUCAP 127 (H) 09/24/2020   GLUCAP 124 (H) 09/24/2020   GLUCAP 114 (H) 09/24/2020   GLUCAP 125 (H) 09/24/2020     Pulmonary Assessment Scores:  Pulmonary Assessment Scores     Row Name 01/10/23 0910         ADL UCSD   ADL Phase Entry     SOB Score total 7     Rest 0     Walk 0     Stairs 4     Bath 0     Dress 0     Shop 0       CAT Score   CAT Score 4       mMRC Score   mMRC Score 1             UCSD: Self-administered rating of dyspnea associated with activities of daily living (ADLs) 6-point scale (0 = "not at all" to 5 = "maximal or unable to do because of breathlessness")  Scoring Scores range from 0 to 120.  Minimally important difference is 5 units  CAT: CAT can identify the health impairment of COPD patients and is better correlated with disease progression.  CAT has a scoring range of zero to 40. The CAT score is classified into four groups of low (less than 10), medium (10 - 20), high (21-30) and very high (31-40) based on the impact level of disease on health status. A CAT score over 10 suggests significant symptoms.  A worsening CAT score could be explained by an exacerbation, poor medication adherence, poor inhaler technique, or progression of COPD or comorbid conditions.  CAT MCID is 2 points  mMRC: mMRC (Modified Medical Research Council) Dyspnea Scale is used to assess the degree of baseline functional disability in patients of respiratory disease due to dyspnea. No minimal important difference is established. A decrease in score of 1 point or greater is considered a positive change.    Pulmonary Function Assessment:   Exercise Target Goals: Exercise  Program Goal: Individual exercise prescription set using results from initial 6 min walk test and THRR while considering  patient's activity barriers and safety.   Exercise Prescription Goal: Initial exercise prescription builds to 30-45 minutes a day of aerobic activity, 2-3 days per week.  Home exercise guidelines will be given to patient during program as part of exercise prescription that the participant will acknowledge.  Activity Barriers & Risk Stratification:  Activity Barriers & Cardiac Risk Stratification - 01/05/23 1318       Activity Barriers & Cardiac Risk Stratification   Activity Barriers Shortness of Breath    Cardiac Risk Stratification Moderate             6 Minute Walk:  6 Minute Walk     Row Name 01/10/23 1001         6 Minute Walk   Phase Initial     Distance 1455 feet     Walk Time 6 minutes     # of Rest Breaks 0     MPH 2.75     METS 5.25     RPE 9     Perceived Dyspnea  0     VO2 Peak 18.38     Symptoms No     Resting HR 65 bpm     Resting BP 140/80     Resting Oxygen Saturation  96 %     Exercise Oxygen Saturation  during 6 min walk 95 %     Max Ex. HR 94 bpm     Max Ex. BP 146/80     2 Minute Post BP 140/78       Interval HR   1 Minute HR 84     2 Minute HR 87     3 Minute HR 86     4 Minute HR 90     5 Minute HR 92     6 Minute HR 94     2 Minute Post HR 76     Interval Heart Rate? Yes       Interval Oxygen   Interval Oxygen? Yes     Baseline Oxygen Saturation % 96 %     1 Minute Oxygen Saturation % 98 %     1 Minute Liters of Oxygen 0 L     2 Minute Oxygen Saturation % 95 %     2 Minute Liters of Oxygen 0 L     3 Minute Oxygen Saturation % 96 %     3 Minute Liters of Oxygen 0 L     4 Minute Oxygen Saturation % 96 %     4 Minute Liters of Oxygen 0 L     5 Minute Oxygen Saturation % 96 %     5 Minute Liters of Oxygen 0 L     6 Minute Oxygen  Saturation % 97 %     6 Minute Liters of Oxygen 0 L     2 Minute Post Oxygen Saturation % 97 %     2 Minute Post Liters of Oxygen 0 L              Oxygen Initial Assessment:  Oxygen Initial Assessment - 01/10/23 0911       Home Oxygen   Home Oxygen Device None    Sleep Oxygen Prescription None    Home Exercise Oxygen Prescription None    Home Resting Oxygen Prescription None      Initial 6 min Walk  Oxygen Used None      Program Oxygen Prescription   Program Oxygen Prescription None      Intervention   Short Term Goals To learn and exhibit compliance with exercise, home and travel O2 prescription;To learn and understand importance of monitoring SPO2 with pulse oximeter and demonstrate accurate use of the pulse oximeter.;To learn and understand importance of maintaining oxygen saturations>88%;To learn and demonstrate proper pursed lip breathing techniques or other breathing techniques. ;To learn and demonstrate proper use of respiratory medications    Long  Term Goals Exhibits compliance with exercise, home  and travel O2 prescription;Verbalizes importance of monitoring SPO2 with pulse oximeter and return demonstration;Maintenance of O2 saturations>88%;Exhibits proper breathing techniques, such as pursed lip breathing or other method taught during program session;Compliance with respiratory medication;Demonstrates proper use of MDI's             Oxygen Re-Evaluation:   Oxygen Discharge (Final Oxygen Re-Evaluation):   Initial Exercise Prescription:  Initial Exercise Prescription - 01/10/23 1000       Date of Initial Exercise RX and Referring Provider   Date 01/10/23    Referring Provider Dr. Vassie Loll      Oxygen   Maintain Oxygen Saturation 88% or higher      Treadmill   MPH 2.5    Grade 0.5    Minutes 15      REL-XR   Level 3    Speed 60    Minutes 15      Prescription Details   Frequency (times per week) 3    Duration Progress to 30 minutes of continuous  aerobic without signs/symptoms of physical distress      Intensity   THRR 40-80% of Max Heartrate 95-125    Ratings of Perceived Exertion 11-13    Perceived Dyspnea 0-4      Resistance Training   Training Prescription Yes    Weight 4    Reps 10-15             Perform Capillary Blood Glucose checks as needed.  Exercise Prescription Changes:   Exercise Comments:   Exercise Comments     Row Name 01/10/23 1005           Exercise Comments Jayson completed his walk test today for starting rehab. He went 1455 ft and did great. He will be exercising on treadmill and elliptical.                Exercise Goals and Review:   Exercise Goals     Row Name 01/10/23 1005             Exercise Goals   Increase Physical Activity Yes       Intervention Provide advice, education, support and counseling about physical activity/exercise needs.;Develop an individualized exercise prescription for aerobic and resistive training based on initial evaluation findings, risk stratification, comorbidities and participant's personal goals.       Expected Outcomes Short Term: Attend rehab on a regular basis to increase amount of physical activity.;Long Term: Exercising regularly at least 3-5 days a week.;Long Term: Add in home exercise to make exercise part of routine and to increase amount of physical activity.       Increase Strength and Stamina Yes       Intervention Provide advice, education, support and counseling about physical activity/exercise needs.;Develop an individualized exercise prescription for aerobic and resistive training based on initial evaluation findings, risk stratification, comorbidities and participant's personal goals.  Expected Outcomes Short Term: Perform resistance training exercises routinely during rehab and add in resistance training at home;Short Term: Increase workloads from initial exercise prescription for resistance, speed, and METs.;Long Term: Improve  cardiorespiratory fitness, muscular endurance and strength as measured by increased METs and functional capacity ( )       Able to understand and use rate of perceived exertion (RPE) scale Yes       Intervention Provide education and explanation on how to use RPE scale       Expected Outcomes Short Term: Able to use RPE daily in rehab to express subjective intensity level;Long Term:  Able to use RPE to guide intensity level when exercising independently       Able to understand and use Dyspnea scale Yes       Intervention Provide education and explanation on how to use Dyspnea scale       Expected Outcomes Short Term: Able to use Dyspnea scale daily in rehab to express subjective sense of shortness of breath during exertion;Long Term: Able to use Dyspnea scale to guide intensity level when exercising independently       Knowledge and understanding of Target Heart Rate Range (THRR) Yes       Intervention Provide education and explanation of THRR including how the numbers were predicted and where they are located for reference       Expected Outcomes Long Term: Able to use THRR to govern intensity when exercising independently;Short Term: Able to state/look up THRR;Short Term: Able to use daily as guideline for intensity in rehab       Able to check pulse independently Yes       Intervention Provide education and demonstration on how to check pulse in carotid and radial arteries.;Review the importance of being able to check your own pulse for safety during independent exercise       Expected Outcomes Short Term: Able to explain why pulse checking is important during independent exercise;Long Term: Able to check pulse independently and accurately       Understanding of Exercise Prescription Yes       Intervention Provide education, explanation, and written materials on patient's individual exercise prescription       Expected Outcomes Short Term: Able to explain program exercise prescription;Long  Term: Able to explain home exercise prescription to exercise independently                Exercise Goals Re-Evaluation :   Discharge Exercise Prescription (Final Exercise Prescription Changes):   Nutrition:  Target Goals: Understanding of nutrition guidelines, daily intake of sodium 1500mg , cholesterol 200mg , calories 30% from fat and 7% or less from saturated fats, daily to have 5 or more servings of fruits and vegetables.  Biometrics:  Pre Biometrics - 01/10/23 1007       Pre Biometrics   Height 5\' 10"  (1.778 m)    Weight 103.9 kg    Waist Circumference 45 inches    Hip Circumference 42 inches    Waist to Hip Ratio 1.07 %    BMI (Calculated) 32.87    Grip Strength 29.4 kg    Single Leg Stand 60 seconds              Nutrition Therapy Plan and Nutrition Goals:   Nutrition Assessments:  MEDIFICTS Score Key: >=70 Need to make dietary changes  40-70 Heart Healthy Diet <= 40 Therapeutic Level Cholesterol Diet  Flowsheet Row PULMONARY REHAB OTHER RESP ORIENTATION from 01/10/2023 in [facility] CARDIAC  REHABILITATION  Picture Your Plate Total Score on Admission 36      Picture Your Plate Scores: <16 Unhealthy dietary pattern with much room for improvement. 41-50 Dietary pattern unlikely to meet recommendations for good health and room for improvement. 51-60 More healthful dietary pattern, with some room for improvement.  >60 Healthy dietary pattern, although there may be some specific behaviors that could be improved.    Nutrition Goals Re-Evaluation:   Nutrition Goals Discharge (Final Nutrition Goals Re-Evaluation):   Psychosocial: Target Goals: Acknowledge presence or absence of significant depression and/or stress, maximize coping skills, provide positive support system. Participant is able to verbalize types and ability to use techniques and skills needed for reducing stress and depression.  Initial Review & Psychosocial Screening:  Initial Psych  Review & Screening - 01/05/23 1345       Initial Review   Current issues with None Identified      Family Dynamics   Good Support System? Yes      Barriers   Psychosocial barriers to participate in program There are no identifiable barriers or psychosocial needs.      Screening Interventions   Interventions Encouraged to exercise;To provide support and resources with identified psychosocial needs;Provide feedback about the scores to participant    Expected Outcomes Short Term goal: Utilizing psychosocial counselor, staff and physician to assist with identification of specific Stressors or current issues interfering with healing process. Setting desired goal for each stressor or current issue identified.;Short Term goal: Identification and review with participant of any Quality of Life or Depression concerns found by scoring the questionnaire.;Long Term Goal: Stressors or current issues are controlled or eliminated.;Long Term goal: The participant improves quality of Life and PHQ9 Scores as seen by post scores and/or verbalization of changes             Quality of Life Scores:  Scores of 19 and below usually indicate a poorer quality of life in these areas.  A difference of  2-3 points is a clinically meaningful difference.  A difference of 2-3 points in the total score of the Quality of Life Index has been associated with significant improvement in overall quality of life, self-image, physical symptoms, and general health in studies assessing change in quality of life.   PHQ-9: Review Flowsheet       01/10/2023  Depression screen PHQ 2/9  Decreased Interest 0  Down, Depressed, Hopeless 0  PHQ - 2 Score 0  Altered sleeping 0  Tired, decreased energy 2  Change in appetite 0  Feeling bad or failure about yourself  0  Trouble concentrating 0  Moving slowly or fidgety/restless 0  Suicidal thoughts 0  PHQ-9 Score 2  Difficult doing work/chores Not difficult at all    Details            Interpretation of Total Score  Total Score Depression Severity:  1-4 = Minimal depression, 5-9 = Mild depression, 10-14 = Moderate depression, 15-19 = Moderately severe depression, 20-27 = Severe depression   Psychosocial Evaluation and Intervention:  Psychosocial Evaluation - 01/05/23 1348       Psychosocial Evaluation & Interventions   Interventions Stress management education;Relaxation education;Encouraged to exercise with the program and follow exercise prescription    Comments Patient referred to PR with Dyspnea. 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 He says he has been SOB since but it is intermittent.  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 He also enjoys fishing off shore and hunting. He denies any depression, anxiety, or stressors. He says he sleeps good. He lives with his wife who is his main support person and they have a son and a daughter who also support him. They have 4 grandchildren. He had his mitral valve replaced a few years ago and participated in cardiac rehab at [facility] in [place] so he is familiar with the program. His goals for the program are to lose weight and imrpove his SOB. He would like to lose the 20 lbs he gained since his valve replacement surgery.    Expected Outcomes Short Term goal: Start the program and attend consistently. Long term: Lose weight and improve his SOB.    Continue Psychosocial Services  Follow up required by staff             Psychosocial Re-Evaluation:   Psychosocial Discharge (Final Psychosocial Re-Evaluation):    Education: Education Goals: Education classes will be provided on a weekly basis, covering required topics. Participant will state understanding/return demonstration of topics  presented.  Learning Barriers/Preferences:  Learning Barriers/Preferences - 01/05/23 1343       Learning Barriers/Preferences   Learning Barriers None    Learning Preferences Skilled Demonstration             Education Topics: How Lungs Work and Diseases: - Discuss the anatomy of the lungs and diseases that can affect the lungs, such as COPD.   Exercise: -Discuss the importance of exercise, FITT principles of exercise, normal and abnormal responses to exercise, and how to exercise safely.   Environmental Irritants: -Discuss types of environmental irritants and how to limit exposure to environmental irritants.   Meds/Inhalers and oxygen: - Discuss respiratory medications, definition of an inhaler and oxygen, and the proper way to use an inhaler and oxygen.   Energy Saving Techniques: - Discuss methods to conserve energy and decrease shortness of breath when performing activities of daily living.    Bronchial Hygiene / Breathing Techniques: - Discuss breathing mechanics, pursed-lip breathing technique,  proper posture, effective ways to clear airways, and other functional breathing techniques   Cleaning Equipment: - Provides group verbal and written instruction about the health risks of elevated stress, cause of high stress, and healthy ways to reduce stress.   Nutrition I: Fats: - Discuss the types of cholesterol, what cholesterol does to the body, and how cholesterol levels can be controlled.   Nutrition II: Labels: -Discuss the different components of food labels and how to read food labels.   Respiratory Infections: - Discuss the signs and symptoms of respiratory infections, ways to prevent respiratory infections, and the importance of seeking medical treatment when having a respiratory infection.   Stress I: Signs and Symptoms: - Discuss the causes of stress, how stress may lead to anxiety and depression, and ways to limit stress.   Stress II:  Relaxation: -Discuss relaxation techniques to limit stress.   Oxygen for Home/Travel: - Discuss how to prepare for travel when on oxygen and proper ways to transport and store oxygen to ensure safety.   Knowledge Questionnaire Score:  Knowledge Questionnaire Score - 01/10/23 0907       Knowledge Questionnaire Score   Pre Score 9/18             Core Components/Risk Factors/Patient Goals at  Admission:  Personal Goals and Risk Factors at Admission - 01/05/23 1344       Core Components/Risk Factors/Patient Goals on Admission    Weight Management Yes    Improve shortness of breath with ADL's Yes    Intervention Provide education, individualized exercise plan and daily activity instruction to help decrease symptoms of SOB with activities of daily living.    Expected Outcomes Short Term: Improve cardiorespiratory fitness to achieve a reduction of symptoms when performing ADLs;Long Term: Be able to perform more ADLs without symptoms or delay the onset of symptoms    Hypertension Yes    Intervention Provide education on lifestyle modifcations including regular physical activity/exercise, weight management, moderate sodium restriction and increased consumption of fresh fruit, vegetables, and low fat dairy, alcohol moderation, and smoking cessation.;Monitor prescription use compliance.    Expected Outcomes Short Term: Continued assessment and intervention until BP is < 140/31mm HG in hypertensive participants. < 130/19mm HG in hypertensive participants with diabetes, heart failure or chronic kidney disease.;Long Term: Maintenance of blood pressure at goal levels.    Lipids Yes    Intervention Provide education and support for participant on nutrition & aerobic/resistive exercise along with prescribed medications to achieve LDL 70mg , HDL >40mg .    Expected Outcomes Short Term: Participant states understanding of desired cholesterol values and is compliant with medications prescribed.  Participant is following exercise prescription and nutrition guidelines.;Long Term: Cholesterol controlled with medications as prescribed, with individualized exercise RX and with personalized nutrition plan. Value goals: LDL < 70mg , HDL > 40 mg.             Core Components/Risk Factors/Patient Goals Review:    Core Components/Risk Factors/Patient Goals at Discharge (Final Review):    ITP Comments:   Comments: Patient arrived for 1st visit/orientation/education at 41. Patient was referred to PR by Dr. Cyril Mourning due to Dyspnea, unspecified type. During orientation advised patient on arrival and appointment times what to wear, what to do before, during and after exercise. Reviewed attendance and class policy.  Pt is scheduled to return Pulmonary Rehab on 01/12/23 at 915. Pt was advised to come to class 15 minutes before class starts.  Discussed RPE/Dpysnea scales. Patient participated in warm up stretches. Patient was able to complete 6 minute walk test. Patient was measured for the equipment. Discussed equipment safety with patient. Took patient pre-anthropometric measurements. Patient finished visit at 0915.

## 2023-01-10 NOTE — Patient Instructions (Signed)
Patient Instructions  Patient Details  Name: Chad Berry MRN: [id number] Date of Birth: Feb 23, 1941 Referring Provider:  Oretha Milch, MD  Below are your personal goals for exercise, nutrition, and risk factors. Our goal is to help you stay on track towards obtaining and maintaining these goals. We will be discussing your progress on these goals with you throughout the program.  Initial Exercise Prescription:  Initial Exercise Prescription - 01/10/23 1000       Date of Initial Exercise RX and Referring Provider   Date 01/10/23    Referring Provider Dr. Vassie Loll      Oxygen   Maintain Oxygen Saturation 88% or higher      Treadmill   MPH 2.5    Grade 0.5    Minutes 15      REL-XR   Level 3    Speed 60    Minutes 15      Prescription Details   Frequency (times per week) 3    Duration Progress to 30 minutes of continuous aerobic without signs/symptoms of physical distress      Intensity   THRR 40-80% of Max Heartrate 95-125    Ratings of Perceived Exertion 11-13    Perceived Dyspnea 0-4      Resistance Training   Training Prescription Yes    Weight 4    Reps 10-15             Exercise Goals: Frequency: Be able to perform aerobic exercise two to three times per week in program working toward 2-5 days per week of home exercise.  Intensity: Work with a perceived exertion of 11 (fairly light) - 15 (hard) while following your exercise prescription.  We will make changes to your prescription with you as you progress through the program.   Duration: Be able to do 30 to 45 minutes of continuous aerobic exercise in addition to a 5 minute warm-up and a 5 minute cool-down routine.   Nutrition Goals: Your personal nutrition goals will be established when you do your nutrition analysis with the dietician.  The following are general nutrition guidelines to follow: Cholesterol < 200mg /day Sodium < 1500mg /day Fiber: Men over 50 yrs - 30 grams per day  Personal Goals:   Personal Goals and Risk Factors at Admission - 01/05/23 1344       Core Components/Risk Factors/Patient Goals on Admission    Weight Management Yes    Improve shortness of breath with ADL's Yes    Intervention Provide education, individualized exercise plan and daily activity instruction to help decrease symptoms of SOB with activities of daily living.    Expected Outcomes Short Term: Improve cardiorespiratory fitness to achieve a reduction of symptoms when performing ADLs;Long Term: Be able to perform more ADLs without symptoms or delay the onset of symptoms    Hypertension Yes    Intervention Provide education on lifestyle modifcations including regular physical activity/exercise, weight management, moderate sodium restriction and increased consumption of fresh fruit, vegetables, and low fat dairy, alcohol moderation, and smoking cessation.;Monitor prescription use compliance.    Expected Outcomes Short Term: Continued assessment and intervention until BP is < 140/54mm HG in hypertensive participants. < 130/62mm HG in hypertensive participants with diabetes, heart failure or chronic kidney disease.;Long Term: Maintenance of blood pressure at goal levels.    Lipids Yes    Intervention Provide education and support for participant on nutrition & aerobic/resistive exercise along with prescribed medications to achieve LDL 70mg , HDL >40mg .    Expected Outcomes Short  Term: Participant states understanding of desired cholesterol values and is compliant with medications prescribed. Participant is following exercise prescription and nutrition guidelines.;Long Term: Cholesterol controlled with medications as prescribed, with individualized exercise RX and with personalized nutrition plan. Value goals: LDL < 70mg , HDL > 40 mg.             Tobacco Use Initial Evaluation: Social History   Tobacco Use  Smoking Status Former  Smokeless Tobacco Former   Types: Chew    Exercise Goals and Review:   Exercise Goals     Row Name 01/10/23 1005             Exercise Goals   Increase Physical Activity Yes       Intervention Provide advice, education, support and counseling about physical activity/exercise needs.;Develop an individualized exercise prescription for aerobic and resistive training based on initial evaluation findings, risk stratification, comorbidities and participant's personal goals.       Expected Outcomes Short Term: Attend rehab on a regular basis to increase amount of physical activity.;Long Term: Exercising regularly at least 3-5 days a week.;Long Term: Add in home exercise to make exercise part of routine and to increase amount of physical activity.       Increase Strength and Stamina Yes       Intervention Provide advice, education, support and counseling about physical activity/exercise needs.;Develop an individualized exercise prescription for aerobic and resistive training based on initial evaluation findings, risk stratification, comorbidities and participant's personal goals.       Expected Outcomes Short Term: Perform resistance training exercises routinely during rehab and add in resistance training at home;Short Term: Increase workloads from initial exercise prescription for resistance, speed, and METs.;Long Term: Improve cardiorespiratory fitness, muscular endurance and strength as measured by increased METs and functional capacity ( )       Able to understand and use rate of perceived exertion (RPE) scale Yes       Intervention Provide education and explanation on how to use RPE scale       Expected Outcomes Short Term: Able to use RPE daily in rehab to express subjective intensity level;Long Term:  Able to use RPE to guide intensity level when exercising independently       Able to understand and use Dyspnea scale Yes       Intervention Provide education and explanation on how to use Dyspnea scale       Expected Outcomes Short Term: Able to use Dyspnea scale  daily in rehab to express subjective sense of shortness of breath during exertion;Long Term: Able to use Dyspnea scale to guide intensity level when exercising independently       Knowledge and understanding of Target Heart Rate Range (THRR) Yes       Intervention Provide education and explanation of THRR including how the numbers were predicted and where they are located for reference       Expected Outcomes Long Term: Able to use THRR to govern intensity when exercising independently;Short Term: Able to state/look up THRR;Short Term: Able to use daily as guideline for intensity in rehab       Able to check pulse independently Yes       Intervention Provide education and demonstration on how to check pulse in carotid and radial arteries.;Review the importance of being able to check your own pulse for safety during independent exercise       Expected Outcomes Short Term: Able to explain why pulse checking is important during independent exercise;Long  Term: Able to check pulse independently and accurately       Understanding of Exercise Prescription Yes       Intervention Provide education, explanation, and written materials on patient's individual exercise prescription       Expected Outcomes Short Term: Able to explain program exercise prescription;Long Term: Able to explain home exercise prescription to exercise independently                Copy of goals given to participant.

## 2023-01-12 ENCOUNTER — Encounter (HOSPITAL_COMMUNITY)
Admission: RE | Admit: 2023-01-12 | Discharge: 2023-01-12 | Disposition: A | Discharge status: Home / Self Care | Payer: 59 | Source: Ambulatory Visit | Attending: Pulmonary Disease

## 2023-01-12 ENCOUNTER — Encounter (HOSPITAL_COMMUNITY): Payer: 59

## 2023-01-12 DIAGNOSIS — R0600 Dyspnea, unspecified: Secondary | ICD-10-CM

## 2023-01-12 NOTE — Progress Notes (Signed)
Daily Session Note  Patient Details  Name: Chad Berry MRN: [id number] Date of Birth: June 15, 1940 Referring Provider:   Flowsheet Row PULMONARY REHAB OTHER RESP ORIENTATION from 01/10/2023 in [facility] CARDIAC REHABILITATION  Referring Provider Dr. Vassie Loll       Encounter Date: 01/12/2023  Check In:  Session Check In - 01/12/23 0930       Check-In   Supervising physician immediately available to respond to emergencies See telemetry face sheet for immediately available MD    Location [facility]    Staff Present Ross Ludwig, BS, Exercise Physiologist;Driana Dazey Daphine Deutscher, RN, BSN;Jessica Hawkins, MA, RCEP, CCRP, CCET    Virtual Visit No    Medication changes reported     No    Fall or balance concerns reported    No    Tobacco Cessation No Change    Warm-up and Cool-down Performed on first and last piece of equipment    Resistance Training Performed Yes    VAD Patient? No      Pain Assessment   Currently in Pain? No/denies             Capillary Blood Glucose: No results found for this or any previous visit (from the past 24 hour(s)).    Social History   Tobacco Use  Smoking Status Former  Smokeless Tobacco Former   Types: Chew    Goals Met:  Independence with exercise equipment Exercise tolerated well No report of concerns or symptoms today Strength training completed today  Goals Unmet:  Not Applicable  Comments: Pt able to follow exercise prescription today without complaint.  Will continue to monitor for progression.    Dr. Dina Rich is Medical Director for [facility] Cardiac Rehab

## 2023-01-15 ENCOUNTER — Other Ambulatory Visit: Payer: Self-pay | Admitting: Internal Medicine

## 2023-01-15 ENCOUNTER — Encounter (HOSPITAL_COMMUNITY)
Admission: RE | Admit: 2023-01-15 | Discharge: 2023-01-15 | Disposition: A | Discharge status: Home / Self Care | Payer: 59 | Source: Ambulatory Visit | Attending: Pulmonary Disease | Admitting: Pulmonary Disease

## 2023-01-15 DIAGNOSIS — R0600 Dyspnea, unspecified: Secondary | ICD-10-CM

## 2023-01-15 NOTE — Progress Notes (Signed)
Daily Session Note  Patient Details  Name: Chad Berry MRN: [id number] Date of Birth: July 15, 1940 Referring Provider:   Flowsheet Row PULMONARY REHAB OTHER RESP ORIENTATION from 01/10/2023 in [facility] CARDIAC REHABILITATION  Referring Provider Dr. Vassie Loll       Encounter Date: 01/15/2023  Check In:  Session Check In - 01/15/23 0900       Check-In   Supervising physician immediately available to respond to emergencies See telemetry face sheet for immediately available MD    Location [facility]    Staff Present Ross Ludwig, BS, Exercise Physiologist;Debra Laural Benes, RN, BSN;Jessica Hawkins, MA, RCEP, CCRP, CCET    Virtual Visit No    Medication changes reported     No    Fall or balance concerns reported    No    Tobacco Cessation No Change    Warm-up and Cool-down Performed on first and last piece of equipment    Resistance Training Performed Yes    VAD Patient? No    PAD/SET Patient? No      Pain Assessment   Currently in Pain? No/denies    Multiple Pain Sites No             Capillary Blood Glucose: No results found for this or any previous visit (from the past 24 hour(s)).    Social History   Tobacco Use  Smoking Status Former  Smokeless Tobacco Former   Types: Chew    Goals Met:  Independence with exercise equipment Exercise tolerated well No report of concerns or symptoms today Strength training completed today  Goals Unmet:  Not Applicable  Comments:  Pt able to follow exercise prescription today without complaint.  Will continue to monitor for progression.    Dr. Erick Blinks is Medical Director for [facility] Pulmonary Rehab.

## 2023-01-17 ENCOUNTER — Encounter (HOSPITAL_COMMUNITY)
Admission: RE | Admit: 2023-01-17 | Discharge: 2023-01-17 | Disposition: A | Discharge status: Home / Self Care | Payer: 59 | Source: Ambulatory Visit | Attending: Pulmonary Disease

## 2023-01-17 DIAGNOSIS — R0600 Dyspnea, unspecified: Secondary | ICD-10-CM

## 2023-01-17 NOTE — Progress Notes (Signed)
Daily Session Note  Patient Details  Name: Chad Berry MRN: [id number] Date of Birth: [birth year] Referring Provider:   Flowsheet Row PULMONARY REHAB OTHER RESP ORIENTATION from 01/10/2023 in [facility] CARDIAC REHABILITATION  Referring Provider Dr. Vassie Loll       Encounter Date: 01/17/2023  Check In:  Session Check In - 01/17/23 0915       Check-In   Supervising physician immediately available to respond to emergencies [facility] MD immediately available    Physician(s) Dr. Jenene Slicker    Location [facility]    Staff Present Ross Ludwig, BS, Exercise Physiologist;Darion Juhasz BSN, RN;Jessica [place], MA, RCEP, CCRP, CCET    Virtual Visit No    Medication changes reported     No    Fall or balance concerns reported    No    Tobacco Cessation No Change    Warm-up and Cool-down Performed on first and last piece of equipment    Resistance Training Performed Yes    VAD Patient? No    PAD/SET Patient? No      Pain Assessment   Currently in Pain? No/denies    Multiple Pain Sites No             Capillary Blood Glucose: No results found for this or any previous visit (from the past 24 hour(s)).    Social History   Tobacco Use  Smoking Status Former  Smokeless Tobacco Former   Types: Chew    Goals Met:  Proper associated with RPD/PD & O2 Sat Independence with exercise equipment Using PLB without cueing & demonstrates good technique Exercise tolerated well Queuing for purse lip breathing No report of concerns or symptoms today Strength training completed today  Goals Unmet:  Not Applicable  Comments: Marland KitchenMarland KitchenPt able to follow exercise prescription today without complaint.  Will continue to monitor for progression.    Dr. Erick Blinks is Medical Director for [facility] Pulmonary Rehab.

## 2023-01-19 ENCOUNTER — Encounter (HOSPITAL_COMMUNITY): Payer: 59

## 2023-01-19 ENCOUNTER — Encounter (HOSPITAL_COMMUNITY)
Admission: RE | Admit: 2023-01-19 | Discharge: 2023-01-19 | Disposition: A | Discharge status: Home / Self Care | Payer: 59 | Source: Ambulatory Visit | Attending: Pulmonary Disease | Admitting: Pulmonary Disease

## 2023-01-19 DIAGNOSIS — R0600 Dyspnea, unspecified: Secondary | ICD-10-CM | POA: Diagnosis not present

## 2023-01-19 NOTE — Progress Notes (Signed)
Daily Session Note  Patient Details  Name: Chad Berry MRN: [id number] Date of Birth: 1940/05/03 Referring Provider:   Flowsheet Row PULMONARY REHAB OTHER RESP ORIENTATION from 01/10/2023 in [facility] CARDIAC REHABILITATION  Referring Provider Dr. Vassie Loll       Encounter Date: 01/19/2023  Check In:  Session Check In - 01/19/23 0915       Check-In   Supervising physician immediately available to respond to emergencies See telemetry face sheet for immediately available MD    Location [facility]    Staff Present Ross Ludwig, BS, Exercise Physiologist;Jessica [place], MA, RCEP, CCRP, Harolyn Rutherford, RN, BSN    Virtual Visit No    Medication changes reported     No    Fall or balance concerns reported    No    Tobacco Cessation No Change    Warm-up and Cool-down Performed on first and last piece of equipment    Resistance Training Performed Yes      Pain Assessment   Currently in Pain? No/denies             Capillary Blood Glucose: No results found for this or any previous visit (from the past 24 hour(s)).    Social History   Tobacco Use  Smoking Status Former  Smokeless Tobacco Former   Types: Chew    Goals Met:  Proper associated with RPD/PD & O2 Sat Independence with exercise equipment Using PLB without cueing & demonstrates good technique Exercise tolerated well Queuing for purse lip breathing No report of concerns or symptoms today Strength training completed today  Goals Unmet:  Not Applicable  Comments: Pt able to follow exercise prescription today without complaint.  Will continue to monitor for progression.    Dr. Erick Blinks is Medical Director for [facility] Pulmonary Rehab.

## 2023-01-22 ENCOUNTER — Encounter (HOSPITAL_COMMUNITY)
Admission: RE | Admit: 2023-01-22 | Discharge: 2023-01-22 | Disposition: A | Discharge status: Home / Self Care | Payer: 59 | Source: Ambulatory Visit | Attending: Pulmonary Disease

## 2023-01-22 DIAGNOSIS — R0600 Dyspnea, unspecified: Secondary | ICD-10-CM

## 2023-01-22 NOTE — Progress Notes (Signed)
Daily Session Note  Patient Details  Name: Chad Berry MRN: [id number] Date of Birth: 1941-01-08 Referring Provider:   Flowsheet Row PULMONARY REHAB OTHER RESP ORIENTATION from 01/10/2023 in [facility] CARDIAC REHABILITATION  Referring Provider Dr. Vassie Loll       Encounter Date: 01/22/2023  Check In:  Session Check In - 01/22/23 0914       Check-In   Supervising physician immediately available to respond to emergencies See telemetry face sheet for immediately available MD    Location [facility]    Staff Present Ross Ludwig, BS, Exercise Physiologist;Debra Laural Benes, RN, BSN;Tawania Daponte, RN;Daphyne Daphine Deutscher, RN, BSN;Jessica Hawkins, MA, RCEP, CCRP, CCET    Virtual Visit No    Medication changes reported     No    Fall or balance concerns reported    No    Tobacco Cessation No Change    Warm-up and Cool-down Performed on first and last piece of equipment    Resistance Training Performed Yes    VAD Patient? No    PAD/SET Patient? No      Pain Assessment   Currently in Pain? No/denies    Multiple Pain Sites No             Capillary Blood Glucose: No results found for this or any previous visit (from the past 24 hour(s)).    Social History   Tobacco Use  Smoking Status Former  Smokeless Tobacco Former   Types: Chew    Goals Met:  Proper associated with RPD/PD & O2 Sat Independence with exercise equipment Using PLB without cueing & demonstrates good technique Exercise tolerated well No report of concerns or symptoms today Strength training completed today  Goals Unmet:  Not Applicable  Comments: Pt able to follow exercise prescription today without complaint.  Will continue to monitor for progression.    Dr. Erick Blinks is Medical Director for [facility] Pulmonary Rehab.

## 2023-01-24 ENCOUNTER — Encounter (HOSPITAL_COMMUNITY)
Admission: RE | Admit: 2023-01-24 | Discharge: 2023-01-24 | Disposition: A | Discharge status: Home / Self Care | Payer: 59 | Source: Ambulatory Visit | Attending: Pulmonary Disease | Admitting: Pulmonary Disease

## 2023-01-24 DIAGNOSIS — R0600 Dyspnea, unspecified: Secondary | ICD-10-CM

## 2023-01-24 NOTE — Progress Notes (Signed)
Daily Session Note  Patient Details  Name: Chad Berry MRN: [id number] Date of Birth: 09-20-1940 Referring Provider:   Flowsheet Row PULMONARY REHAB OTHER RESP ORIENTATION from 01/10/2023 in [facility] CARDIAC REHABILITATION  Referring Provider Dr. Vassie Loll       Encounter Date: 01/24/2023  Check In:  Session Check In - 01/24/23 0915       Check-In   Supervising physician immediately available to respond to emergencies [facility] MD immediately available    Physician(s) Dr. Wyline Mood    Location [facility]    Staff Present Ross Ludwig, BS, Exercise Physiologist;Jimmylee Ratterree BSN, RN;Jessica [place], MA, RCEP, CCRP, CCET    Virtual Visit No    Medication changes reported     No    Fall or balance concerns reported    No    Tobacco Cessation No Change    Warm-up and Cool-down Performed on first and last piece of equipment    Resistance Training Performed Yes    VAD Patient? No    PAD/SET Patient? No      Pain Assessment   Currently in Pain? No/denies    Multiple Pain Sites No             Capillary Blood Glucose: No results found for this or any previous visit (from the past 24 hour(s)).    Social History   Tobacco Use  Smoking Status Former  Smokeless Tobacco Former   Types: Chew    Goals Met:  Proper associated with RPD/PD & O2 Sat Independence with exercise equipment Using PLB without cueing & demonstrates good technique Exercise tolerated well Queuing for purse lip breathing No report of concerns or symptoms today Strength training completed today  Goals Unmet:  Not Applicable  Comments: Marland KitchenMarland KitchenPt able to follow exercise prescription today without complaint.  Will continue to monitor for progression.    Dr. Erick Blinks is Medical Director for [facility] Pulmonary Rehab.

## 2023-01-26 ENCOUNTER — Encounter (HOSPITAL_COMMUNITY): Payer: 59

## 2023-01-26 ENCOUNTER — Encounter (HOSPITAL_COMMUNITY)
Admission: RE | Admit: 2023-01-26 | Discharge: 2023-01-26 | Disposition: A | Discharge status: Home / Self Care | Payer: 59 | Source: Ambulatory Visit | Attending: Pulmonary Disease | Admitting: Pulmonary Disease

## 2023-01-26 DIAGNOSIS — R0600 Dyspnea, unspecified: Secondary | ICD-10-CM

## 2023-01-26 NOTE — Progress Notes (Signed)
Daily Session Note  Patient Details  Name: Chad Berry MRN: [id number] Date of Birth: 1940/09/06 Referring Provider:   Flowsheet Row PULMONARY REHAB OTHER RESP ORIENTATION from 01/10/2023 in [facility] CARDIAC REHABILITATION  Referring Provider Dr. Vassie Loll       Encounter Date: 01/26/2023  Check In:  Session Check In - 01/26/23 0915       Check-In   Supervising physician immediately available to respond to emergencies See telemetry face sheet for immediately available MD    Location [facility]    Staff Present Ross Ludwig, BS, Exercise Physiologist;Debra Laural Benes, RN, BSN;Jessica [place], MA, RCEP, CCRP, CCET;Phyllis Billingsley, RN    Virtual Visit No    Medication changes reported     No    Fall or balance concerns reported    No    Tobacco Cessation No Change    Warm-up and Cool-down Performed on first and last piece of equipment    Resistance Training Performed Yes    VAD Patient? No    PAD/SET Patient? No      Pain Assessment   Currently in Pain? No/denies    Multiple Pain Sites No             Capillary Blood Glucose: No results found for this or any previous visit (from the past 24 hour(s)).    Social History   Tobacco Use  Smoking Status Former  Smokeless Tobacco Former   Types: Chew    Goals Met:  Independence with exercise equipment Exercise tolerated well No report of concerns or symptoms today Strength training completed today  Goals Unmet:  Not Applicable  Comments: Pt able to follow exercise prescription today without complaint.  Will continue to monitor for progression.

## 2023-01-29 ENCOUNTER — Encounter (HOSPITAL_COMMUNITY)
Admission: RE | Admit: 2023-01-29 | Discharge: 2023-01-29 | Disposition: A | Discharge status: Home / Self Care | Payer: 59 | Source: Ambulatory Visit | Attending: Pulmonary Disease | Admitting: Pulmonary Disease

## 2023-01-29 DIAGNOSIS — R0600 Dyspnea, unspecified: Secondary | ICD-10-CM | POA: Diagnosis not present

## 2023-01-29 NOTE — Progress Notes (Signed)
Daily Session Note  Patient Details  Name: Chad Berry MRN: [id number] Date of Birth: 08-Jul-1940 Referring Provider:   Flowsheet Row PULMONARY REHAB OTHER RESP ORIENTATION from 01/10/2023 in [facility] CARDIAC REHABILITATION  Referring Provider Dr. Vassie Loll       Encounter Date: 01/29/2023  Check In:  Session Check In - 01/29/23 0915       Check-In   Supervising physician immediately available to respond to emergencies See telemetry face sheet for immediately available MD    Location [facility]    Staff Present Ross Ludwig, BS, Exercise Physiologist;Jessica Juanetta [place], MA, RCEP, CCRP, Dow Adolph, RN, BSN    Virtual Visit No    Medication changes reported     No    Fall or balance concerns reported    No    Tobacco Cessation No Change    Warm-up and Cool-down Performed on first and last piece of equipment    Resistance Training Performed Yes    VAD Patient? No    PAD/SET Patient? No      Pain Assessment   Currently in Pain? No/denies    Multiple Pain Sites No             Capillary Blood Glucose: No results found for this or any previous visit (from the past 24 hour(s)).    Social History   Tobacco Use  Smoking Status Former  Smokeless Tobacco Former   Types: Chew    Goals Met:  Independence with exercise equipment Exercise tolerated well No report of concerns or symptoms today Strength training completed today  Goals Unmet:  Not Applicable  Comments: Pt able to follow exercise prescription today without complaint.  Will continue to monitor for progression.    Dr. Erick Blinks is Medical Director for [facility] Pulmonary Rehab.

## 2023-01-29 NOTE — Progress Notes (Signed)
Reviewed home exercise with pt today.  Pt plans to walk with dogs at home for exercise.  Reviewed THR, pulse, RPE, sign and symptoms, pulse oximetery and when to call 911 or MD.  Also discussed weather considerations and indoor options.  Pt voiced understanding.

## 2023-01-31 ENCOUNTER — Encounter (HOSPITAL_COMMUNITY)
Admission: RE | Admit: 2023-01-31 | Discharge: 2023-01-31 | Disposition: A | Discharge status: Home / Self Care | Payer: 59 | Source: Ambulatory Visit | Attending: Pulmonary Disease | Admitting: Pulmonary Disease

## 2023-01-31 DIAGNOSIS — R0600 Dyspnea, unspecified: Secondary | ICD-10-CM | POA: Insufficient documentation

## 2023-01-31 NOTE — Progress Notes (Signed)
Daily Session Note  Patient Details  Name: Chad Berry MRN: [id number] Date of Birth: Feb 19, 1941 Referring Provider:   Flowsheet Row PULMONARY REHAB OTHER RESP ORIENTATION from 01/10/2023 in [facility] CARDIAC REHABILITATION  Referring Provider Dr. Vassie Loll       Encounter Date: 01/31/2023  Check In:  Session Check In - 01/31/23 0918       Check-In   Supervising physician immediately available to respond to emergencies See telemetry face sheet for immediately available MD    Location [facility]    Staff Present Ross Ludwig, BS, Exercise Physiologist;Rivers Gassmann Wales BSN, RN;Jessica [place], MA, RCEP, CCRP, Dow Adolph, RN, BSN    Virtual Visit No    Medication changes reported     No    Fall or balance concerns reported    No    Tobacco Cessation No Change    Warm-up and Cool-down Performed on first and last piece of equipment    Resistance Training Performed Yes    VAD Patient? No    PAD/SET Patient? No      Pain Assessment   Currently in Pain? No/denies    Multiple Pain Sites No             Capillary Blood Glucose: No results found for this or any previous visit (from the past 24 hour(s)).    Social History   Tobacco Use  Smoking Status Former  Smokeless Tobacco Former   Types: Chew    Goals Met:  Proper associated with RPD/PD & O2 Sat Independence with exercise equipment Using PLB without cueing & demonstrates good technique Exercise tolerated well Queuing for purse lip breathing No report of concerns or symptoms today Strength training completed today  Goals Unmet:  Not Applicable  Comments: Marland KitchenMarland KitchenPt able to follow exercise prescription today without complaint.  Will continue to monitor for progression.    Dr. Erick Blinks is Medical Director for [facility] Pulmonary Rehab.

## 2023-02-02 ENCOUNTER — Encounter (HOSPITAL_COMMUNITY): Payer: 59

## 2023-02-02 ENCOUNTER — Encounter (HOSPITAL_COMMUNITY)
Admission: RE | Admit: 2023-02-02 | Discharge: 2023-02-02 | Disposition: A | Discharge status: Home / Self Care | Payer: 59 | Source: Ambulatory Visit | Attending: Pulmonary Disease

## 2023-02-02 DIAGNOSIS — R0600 Dyspnea, unspecified: Secondary | ICD-10-CM

## 2023-02-02 NOTE — Progress Notes (Signed)
Daily Session Note  Patient Details  Name: Chad Berry MRN: [id number] Date of Birth: Jul 25, 1940 Referring Provider:   Flowsheet Row PULMONARY REHAB OTHER RESP ORIENTATION from 01/10/2023 in [facility] CARDIAC REHABILITATION  Referring Provider Dr. Vassie Loll       Encounter Date: 02/02/2023  Check In:  Session Check In - 02/02/23 0915       Check-In   Supervising physician immediately available to respond to emergencies See telemetry face sheet for immediately available ER MD    Location [facility]    Staff Present Rodena Medin, RN, BSN;Jessica Juanetta Gosling, MA, RCEP, CCRP, CCET;Hillary [employer], RN    Virtual Visit No    Medication changes reported     No    Fall or balance concerns reported    No    Warm-up and Cool-down Performed on first and last piece of equipment    VAD Patient? No    PAD/SET Patient? No      Pain Assessment   Currently in Pain? No/denies    Multiple Pain Sites No             Capillary Blood Glucose: No results found for this or any previous visit (from the past 24 hour(s)).    Social History   Tobacco Use  Smoking Status Former  Smokeless Tobacco Former   Types: Chew    Goals Met:  Proper associated with RPD/PD & O2 Sat Independence with exercise equipment Using PLB without cueing & demonstrates good technique Exercise tolerated well No report of concerns or symptoms today Strength training completed today  Goals Unmet:  Not Applicable  Comments: Pt able to follow exercise prescription today without complaint.  Will continue to monitor for progression.    Dr. Erick Blinks is Medical Director for [facility].

## 2023-02-05 ENCOUNTER — Encounter (HOSPITAL_COMMUNITY): Payer: 59

## 2023-02-07 ENCOUNTER — Encounter (HOSPITAL_COMMUNITY): Payer: Self-pay | Admitting: *Deleted

## 2023-02-07 ENCOUNTER — Encounter (HOSPITAL_COMMUNITY): Payer: 59

## 2023-02-07 DIAGNOSIS — R0600 Dyspnea, unspecified: Secondary | ICD-10-CM

## 2023-02-07 NOTE — Progress Notes (Signed)
Pulmonary Individual Treatment Plan  Patient Details  Name: Chad Berry MRN: [id number] Date of Birth: Dec 11, 1940 Referring Provider:   Flowsheet Row PULMONARY REHAB OTHER RESP ORIENTATION from 01/10/2023 in [facility] CARDIAC REHABILITATION  Referring Provider Dr. Vassie Loll       Initial Encounter Date:  Flowsheet Row PULMONARY REHAB OTHER RESP ORIENTATION from 01/10/2023 in [facility] CARDIAC REHABILITATION  Date 01/10/23       Visit Diagnosis: Dyspnea, unspecified type  Patient's Home Medications on Admission:   Current Outpatient Medications:    ALPRAZolam (XANAX) 0.5 MG tablet, Take 0.5 mg by mouth at bedtime as needed. (Patient not taking: Reported on 01/05/2023), Disp: , Rfl:    amoxicillin (AMOXIL) 500 MG capsule, Take 4 capsules (2,000mg ) by mouth 30-60 minutes prior to dental work or cleaning., Disp: 4 capsule, Rfl: 3   Ascorbic Acid (VITAMIN C) 1000 MG tablet, Take 1,000 mg by mouth daily. (Patient not taking: Reported on 01/05/2023), Disp: , Rfl:    Cholecalciferol (VITAMIN D) 50 MCG (2000 UT) tablet, Take 2,000 Units by mouth daily., Disp: , Rfl:    Evolocumab (REPATHA SURECLICK) 140 MG/ML SOAJ, Inject 140 mg into the skin every 14 (fourteen) days., Disp: 6 mL, Rfl: 3   ezetimibe (ZETIA) 10 MG tablet, Take 1 tablet (10 mg total) by mouth daily., Disp: 90 tablet, Rfl: 3   famotidine (PEPCID) 40 MG tablet, Take 1 tablet (40 mg total) by mouth at bedtime., Disp: 30 tablet, Rfl: 1   levothyroxine (SYNTHROID) 75 MCG tablet, Take 75 mcg by mouth at bedtime., Disp: , Rfl:    losartan (COZAAR) 50 MG tablet, Take 50 mg by mouth daily., Disp: , Rfl:    tamsulosin (FLOMAX) 0.4 MG CAPS capsule, Take 0.4 mg by mouth at bedtime., Disp: , Rfl:    vitamin B-12 (CYANOCOBALAMIN) 100 MCG tablet, Take 100 mcg by mouth daily., Disp: , Rfl:   Past Medical History: Past Medical History:  Diagnosis Date   Aortic atherosclerosis (HCC)    Bradycardia    while on BB with HR to the 40s   Carotid  stenosis    CAROTID DOPPLER, 12/07/2011 - Mild arthrosclerotic changes, no high-grade stenosis   Cataract    rt. eye   Clostridium difficile infection 05/2019   Diverticulitis    Heart murmur    History of echocardiogram    Echo 9/16:  EF 60-65%, no RWMA, Gr 1 DD, holosystolic MVP of posterior leaflet, mild MR, LA upper limits of normal, normal RVF, mod TR, PASP 50 mmHg   Hypercholesteremia    Hyperlipidemia    Hypertension    Hypothyroidism    MVP (mitral valve prolapse)    a. Echo 12/07/2011 - EF-65-70%, severe mitral regurgitation, moderate-severe tricuspid regurgitation, moderate pulmonary HTN, moderate pulmonic regurgitation;  b. 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3  47.5  43.6  45.5   Bicarbonate 20.0 - 28.0 mmol/L 24.7  25.7  25.5  23.6  19.1  21.1  22.8  24.0  26.9  24.8  25.3  24.7   TCO2 22 - 32 mmol/L 26  27  27   - 20  23  24  22  25  24  28  26  26  27  23  26  26     Acid-base deficit 0.0 - 2.0 mmol/L 1.0  1.0  0.8  5.0  6.0  3.0  1.0  1.0   O2 Saturation % 95.0  77.0  76.0  97.9  99.0  94.0  99.0  100.0  100.0  87.0  100.0  100.0     Details       Multiple values from one day are sorted in reverse-chronological order         Capillary Blood Glucose: Lab Results  Component Value Date   GLUCAP 157 (H) 09/28/2020   GLUCAP 127 (H) 09/24/2020   GLUCAP 124 (H) 09/24/2020   GLUCAP 114 (H) 09/24/2020   GLUCAP 125 (H) 09/24/2020     Pulmonary Assessment Scores:  Pulmonary Assessment Scores     Row Name 01/10/23 0910         ADL UCSD   ADL Phase Entry     SOB Score total 7     Rest 0     Walk 0     Stairs 4     Bath 0     Dress 0     Shop 0       CAT Score   CAT Score 4       mMRC Score   mMRC Score 1             UCSD: Self-administered rating of dyspnea associated with activities of daily living (ADLs) 6-point scale (0 = "not at all" to 5 = "maximal or unable to do because of breathlessness")  Scoring Scores range from 0 to 120.  Minimally important difference is 5 units  CAT: CAT can identify the health impairment of COPD patients and is better correlated with disease progression.  CAT has a scoring range of zero to 40. The CAT score is classified into four groups of low (less than 10), medium (10 - 20), high (21-30) and very high (31-40) based on the impact level of disease on health status. A CAT score over 10 suggests significant symptoms.  A worsening CAT score could be explained by an exacerbation, poor medication adherence, poor inhaler technique, or progression of COPD or comorbid conditions.  CAT MCID is 2 points  mMRC: mMRC (Modified Medical Research Council) Dyspnea Scale is used to assess the degree of baseline functional disability in patients of respiratory disease due to dyspnea. No minimal important difference is established. A decrease in score of 1 point or greater is considered a positive change.    Pulmonary Function Assessment:   Exercise Target Goals: Exercise  Program Goal: Individual exercise prescription set using results from initial 6 min walk test and THRR while considering  patient's activity barriers and safety.   Exercise Prescription Goal: Initial exercise prescription builds to 30-45 minutes a day of aerobic activity, 2-3 days per week.  Home exercise guidelines will be given to patient during program as part of exercise prescription that the participant will acknowledge.  Activity Barriers & Risk Stratification:  Activity Barriers & Cardiac Risk Stratification - 01/05/23 1318       Activity Barriers & Cardiac Risk Stratification   Activity Barriers Shortness of Breath    Cardiac Risk Stratification Moderate             6 Minute Walk:  6 Minute Walk     Row Name 01/10/23 1001         6 Minute Walk   Phase Initial     Distance 1455 feet     Walk Time 6 minutes     # of Rest Breaks 0     MPH 2.75     METS 5.25     RPE 9     Perceived Dyspnea  0     VO2 Peak 18.38     Symptoms No     Resting HR 65 bpm     Resting BP 140/80     Resting Oxygen Saturation  96 %     Exercise Oxygen Saturation  during 6 min walk 95 %     Max Ex. HR 94 bpm     Max Ex. BP 146/80     2 Minute Post BP 140/78       Interval HR   1 Minute HR 84     2 Minute HR 87     3 Minute HR 86     4 Minute HR 90     5 Minute HR 92     6 Minute HR 94     2 Minute Post HR 76     Interval Heart Rate? Yes       Interval Oxygen   Interval Oxygen? Yes     Baseline Oxygen Saturation % 96 %     1 Minute Oxygen Saturation % 98 %     1 Minute Liters of Oxygen 0 L     2 Minute Oxygen Saturation % 95 %     2 Minute Liters of Oxygen 0 L     3 Minute Oxygen Saturation % 96 %     3 Minute Liters of Oxygen 0 L     4 Minute Oxygen Saturation % 96 %     4 Minute Liters of Oxygen 0 L     5 Minute Oxygen Saturation % 96 %     5 Minute Liters of Oxygen 0 L     6 Minute Oxygen  Saturation % 97 %     6 Minute Liters of Oxygen 0 L     2 Minute Post Oxygen Saturation % 97 %     2 Minute Post Liters of Oxygen 0 L              Oxygen Initial Assessment:  Oxygen Initial Assessment - 01/10/23 0911       Home Oxygen   Home Oxygen Device None    Sleep Oxygen Prescription None    Home Exercise Oxygen Prescription None    Home Resting Oxygen Prescription None      Initial 6 min Walk  Oxygen Used None      Program Oxygen Prescription   Program Oxygen Prescription None      Intervention   Short Term Goals To learn and exhibit compliance with exercise, home and travel O2 prescription;To learn and understand importance of monitoring SPO2 with pulse oximeter and demonstrate accurate use of the pulse oximeter.;To learn and understand importance of maintaining oxygen saturations>88%;To learn and demonstrate proper pursed lip breathing techniques or other breathing techniques. ;To learn and demonstrate proper use of respiratory medications    Long  Term Goals Exhibits compliance with exercise, home  and travel O2 prescription;Verbalizes importance of monitoring SPO2 with pulse oximeter and return demonstration;Maintenance of O2 saturations>88%;Exhibits proper breathing techniques, such as pursed lip breathing or other method taught during program session;Compliance with respiratory medication;Demonstrates proper use of MDI's             Oxygen Re-Evaluation:  Oxygen Re-Evaluation     Row Name 01/29/23 1044             Program Oxygen Prescription   Program Oxygen Prescription None         Home Oxygen   Home Oxygen Device None       Sleep Oxygen Prescription None       Home Exercise Oxygen Prescription None       Home Resting Oxygen Prescription None         Goals/Expected Outcomes   Short Term Goals To learn and understand importance of monitoring SPO2 with pulse oximeter and demonstrate accurate use of the pulse oximeter.;To learn and understand  importance of maintaining oxygen saturations>88%;To learn and demonstrate proper pursed lip breathing techniques or other breathing techniques.        Long  Term Goals Verbalizes importance of monitoring SPO2 with pulse oximeter and return demonstration;Maintenance of O2 saturations>88%;Exhibits proper breathing techniques, such as pursed lip breathing or other method taught during program session       Comments Xue is doing well in rehab. He is breathing better walking, but still getting SOB with bending, carrying, and walking up stairs.  We talked about making sure to use his PLB for all three and to also make sure that he is not holding his breath when he does these. He has been working on his PLB and will continue to do so.  He is going to try not to hold his breath when he is carrying things to see if it improves.       Goals/Expected Outcomes Short: Make sure to not hold breath while carrying items Long: Continue to improve SOB and use PLB                Oxygen Discharge (Final Oxygen Re-Evaluation):  Oxygen Re-Evaluation - 01/29/23 1044       Program Oxygen Prescription   Program Oxygen Prescription None      Home Oxygen   Home Oxygen Device None    Sleep Oxygen Prescription None    Home Exercise Oxygen Prescription None    Home Resting Oxygen Prescription None      Goals/Expected Outcomes   Short Term Goals To learn and understand importance of monitoring SPO2 with pulse oximeter and demonstrate accurate use of the pulse oximeter.;To learn and understand importance of maintaining oxygen saturations>88%;To learn and demonstrate proper pursed lip breathing techniques or other breathing techniques.     Long  Term Goals Verbalizes importance of monitoring SPO2 with pulse oximeter and return demonstration;Maintenance of O2 saturations>88%;Exhibits proper breathing techniques, such  as pursed lip breathing or other method taught during program session    Comments Darrien is doing well in  rehab. He is breathing better walking, but still getting SOB with bending, carrying, and walking up stairs.  We talked about making sure to use his PLB for all three and to also make sure that he is not holding his breath when he does these. He has been working on his PLB and will continue to do so.  He is going to try not to hold his breath when he is carrying things to see if it improves.    Goals/Expected Outcomes Short: Make sure to not hold breath while carrying items Long: Continue to improve SOB and use PLB             Initial Exercise Prescription:  Initial Exercise Prescription - 01/10/23 1000       Date of Initial Exercise RX and Referring Provider   Date 01/10/23    Referring Provider Dr. Vassie Loll      Oxygen   Maintain Oxygen Saturation 88% or higher      Treadmill   MPH 2.5    Grade 0.5    Minutes 15      REL-XR   Level 3    Speed 60    Minutes 15      Prescription Details   Frequency (times per week) 3    Duration Progress to 30 minutes of continuous aerobic without signs/symptoms of physical distress      Intensity   THRR 40-80% of Max Heartrate 95-125    Ratings of Perceived Exertion 11-13    Perceived Dyspnea 0-4      Resistance Training   Training Prescription Yes    Weight 4    Reps 10-15             Perform Capillary Blood Glucose checks as needed.  Exercise Prescription Changes:   Exercise Prescription Changes     Row Name 01/22/23 [phone] 01/29/23 1000           Response to Exercise   Blood Pressure (Admit) 110/60 --      Blood Pressure (Exercise) 142/62 --      Blood Pressure (Exit) 116/78 --      Heart Rate (Admit) 70 bpm --      Heart Rate (Exercise) 97 bpm --      Heart Rate (Exit) 75 bpm --      Oxygen Saturation (Admit) 95 % --      Oxygen Saturation (Exercise) 97 % --      Oxygen Saturation (Exit) 96 % --      Rating of Perceived Exertion (Exercise) 12 --      Perceived Dyspnea (Exercise) 0 --      Duration Continue with  30 min of aerobic exercise without signs/symptoms of physical distress. --      Intensity THRR unchanged --        Progression   Progression Continue to progress workloads to maintain intensity without signs/symptoms of physical distress. --        Paramedic Prescription Yes --      Weight 4 lbs --      Reps 10-15 --        Treadmill   MPH 3.1 --      Grade 2.5 --      Minutes 15 --      METs 4.44 --  Bike   Level 6 --      Watts 62 --      Minutes 15 --      METs 3.3 --        Home Exercise Plan   Plans to continue exercise at -- Home (comment)  walking      Frequency -- Add 3 additional days to program exercise sessions.      Initial Home Exercises Provided -- 01/29/23        Oxygen   Maintain Oxygen Saturation 88% or higher --               Exercise Comments:   Exercise Comments     Row Name 01/10/23 1005           Exercise Comments Marq completed his walk test today for starting rehab. He went 1455 ft and did great. He will be exercising on treadmill and elliptical.                Exercise Goals and Review:   Exercise Goals     Row Name 01/10/23 1005             Exercise Goals   Increase Physical Activity Yes       Intervention Provide advice, education, support and counseling about physical activity/exercise needs.;Develop an individualized exercise prescription for aerobic and resistive training based on initial evaluation findings, risk stratification, comorbidities and participant's personal goals.       Expected Outcomes Short Term: Attend rehab on a regular basis to increase amount of physical activity.;Long Term: Exercising regularly at least 3-5 days a week.;Long Term: Add in home exercise to make exercise part of routine and to increase amount of physical activity.       Increase Strength and Stamina Yes       Intervention Provide advice, education, support and counseling about physical activity/exercise  needs.;Develop an individualized exercise prescription for aerobic and resistive training based on initial evaluation findings, risk stratification, comorbidities and participant's personal goals.       Expected Outcomes Short Term: Perform resistance training exercises routinely during rehab and add in resistance training at home;Short Term: Increase workloads from initial exercise prescription for resistance, speed, and METs.;Long Term: Improve cardiorespiratory fitness, muscular endurance and strength as measured by increased METs and functional capacity ( )       Able to understand and use rate of perceived exertion (RPE) scale Yes       Intervention Provide education and explanation on how to use RPE scale       Expected Outcomes Short Term: Able to use RPE daily in rehab to express subjective intensity level;Long Term:  Able to use RPE to guide intensity level when exercising independently       Able to understand and use Dyspnea scale Yes       Intervention Provide education and explanation on how to use Dyspnea scale       Expected Outcomes Short Term: Able to use Dyspnea scale daily in rehab to express subjective sense of shortness of breath during exertion;Long Term: Able to use Dyspnea scale to guide intensity level when exercising independently       Knowledge and understanding of Target Heart Rate Range (THRR) Yes       Intervention Provide education and explanation of THRR including how the numbers were predicted and where they are located for reference       Expected Outcomes Long Term: Able to use THRR to govern  intensity when exercising independently;Short Term: Able to state/look up THRR;Short Term: Able to use daily as guideline for intensity in rehab       Able to check pulse independently Yes       Intervention Provide education and demonstration on how to check pulse in carotid and radial arteries.;Review the importance of being able to check your own pulse for safety during  independent exercise       Expected Outcomes Short Term: Able to explain why pulse checking is important during independent exercise;Long Term: Able to check pulse independently and accurately       Understanding of Exercise Prescription Yes       Intervention Provide education, explanation, and written materials on patient's individual exercise prescription       Expected Outcomes Short Term: Able to explain program exercise prescription;Long Term: Able to explain home exercise prescription to exercise independently                Exercise Goals Re-Evaluation :  Exercise Goals Re-Evaluation     Row Name 01/23/23 0909 01/29/23 0957           Exercise Goal Re-Evaluation   Exercise Goals Review Increase Physical Activity;Increase Strength and Stamina;Understanding of Exercise Prescription Increase Physical Activity;Increase Strength and Stamina;Able to understand and use rate of perceived exertion (RPE) scale;Able to understand and use Dyspnea scale;Able to check pulse independently;Knowledge and understanding of Target Heart Rate Range (THRR);Understanding of Exercise Prescription      Comments Ashon has been doing great in rehab. He has been increasing his workloads each week. He is currently exercising at level 6 on the bike and walking at 3.1 speed with a 2.5 grade on the treadmill. Will continue to monitor and progress as able. [place] with [place] daily.  Reviewed home exercise with pt today.  Pt plans to walk with dogs at home for exercise.  Reviewed THR, pulse, RPE, sign and symptoms, pulse oximetery and when to call 911 or MD.  Also discussed weather considerations and indoor options.  Pt voiced understanding.      Expected Outcomes Short term: continue to exercise at current workloads    long term: continue to attend rehab Short: Continue to add in walking at home Long: Conitnue to improve breathing.               Discharge Exercise Prescription  (Final Exercise Prescription Changes):  Exercise Prescription Changes - 01/29/23 1000       Home Exercise Plan   Plans to continue exercise at Home (comment)   walking   Frequency Add 3 additional days to program exercise sessions.    Initial Home Exercises Provided 01/29/23             Nutrition:  Target Goals: Understanding of nutrition guidelines, daily intake of sodium 1500mg , cholesterol 200mg , calories 30% from fat and 7% or less from saturated fats, daily to have 5 or more servings of fruits and vegetables.  Biometrics:  Pre Biometrics - 01/10/23 1007       Pre Biometrics   Height 5\' 10"  (1.778 m)    Weight 229 lb 0.9 oz (103.9 kg)    Waist Circumference 45 inches    Hip Circumference 42 inches    Waist to Hip Ratio 1.07 %    BMI (Calculated) 32.87    Grip Strength 29.4 kg    Single Leg Stand 60 seconds  Nutrition Therapy Plan and Nutrition Goals:   Nutrition Assessments:  MEDIFICTS Score Key: >=70 Need to make dietary changes  40-70 Heart Healthy Diet <= 40 Therapeutic Level Cholesterol Diet  Flowsheet Row PULMONARY REHAB OTHER RESP ORIENTATION from 01/10/2023 in [facility] CARDIAC REHABILITATION  Picture Your Plate Total Score on Admission 36      Picture Your Plate Scores: <86 Unhealthy dietary pattern with much room for improvement. 41-50 Dietary pattern unlikely to meet recommendations for good health and room for improvement. 51-60 More healthful dietary pattern, with some room for improvement.  >60 Healthy dietary pattern, although there Chad be some specific behaviors that could be improved.    Nutrition Goals Re-Evaluation:  Nutrition Goals Re-Evaluation     Row Name 01/29/23 1043             Goals   Nutrition Goal Healthy Eating Plan       Comment Lawsen is doing well in rehab.  He is not follow any particular diet, but he is trying to eat healthier. He is getting a lot of fruits and vegetables.  He has also started to  cut back on his sweets.  He is getting enough protein and works on portion control.       Expected Outcome Short: Continue to add in more fruit and vegetables Long: Continue to focus on healthy eatin g                Nutrition Goals Discharge (Final Nutrition Goals Re-Evaluation):  Nutrition Goals Re-Evaluation - 01/29/23 1043       Goals   Nutrition Goal Healthy Eating Plan    Comment Dhruva is doing well in rehab.  He is not follow any particular diet, but he is trying to eat healthier. He is getting a lot of fruits and vegetables.  He has also started to cut back on his sweets.  He is getting enough protein and works on portion control.    Expected Outcome Short: Continue to add in more fruit and vegetables Long: Continue to focus on healthy eatin g             Psychosocial: Target Goals: Acknowledge presence or absence of significant depression and/or stress, maximize coping skills, provide positive support system. Participant is able to verbalize types and ability to use techniques and skills needed for reducing stress and depression.  Initial Review & Psychosocial Screening:  Initial Psych Review & Screening - 01/05/23 1345       Initial Review   Current issues with None Identified      Family Dynamics   Good Support System? Yes      Barriers   Psychosocial barriers to participate in program There are no identifiable barriers or psychosocial needs.      Screening Interventions   Interventions Encouraged to exercise;To provide support and resources with identified psychosocial needs;Provide feedback about the scores to participant    Expected Outcomes Short Term goal: Utilizing psychosocial counselor, staff and physician to assist with identification of specific Stressors or current issues interfering with healing process. Setting desired goal for each stressor or current issue identified.;Short Term goal: Identification and review with participant of any Quality of Life  or Depression concerns found by scoring the questionnaire.;Long Term Goal: Stressors or current issues are controlled or eliminated.;Long Term goal: The participant improves quality of Life and PHQ9 Scores as seen by post scores and/or verbalization of changes  Quality of Life Scores:  Scores of 19 and below usually indicate a poorer quality of life in these areas.  A difference of  2-3 points is a clinically meaningful difference.  A difference of 2-3 points in the total score of the Quality of Life Index has been associated with significant improvement in overall quality of life, self-image, physical symptoms, and general health in studies assessing change in quality of life.   PHQ-9: Review Flowsheet       01/10/2023  Depression screen PHQ 2/9  Decreased Interest 0  Down, Depressed, Hopeless 0  PHQ - 2 Score 0  Altered sleeping 0  Tired, decreased energy 2  Change in appetite 0  Feeling bad or failure about yourself  0  Trouble concentrating 0  Moving slowly or fidgety/restless 0  Suicidal thoughts 0  PHQ-9 Score 2  Difficult doing work/chores Not difficult at all    Details           Interpretation of Total Score  Total Score Depression Severity:  1-4 = Minimal depression, 5-9 = Mild depression, 10-14 = Moderate depression, 15-19 = Moderately severe depression, 20-27 = Severe depression   Psychosocial Evaluation and Intervention:  Psychosocial Evaluation - 01/05/23 1348       Psychosocial Evaluation & Interventions   Interventions Stress management education;Relaxation education;Encouraged to exercise with the program and follow exercise prescription    Comments Patient referred to PR with Dyspnea. 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 He says he has been SOB since but it is intermittent. 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 He also enjoys fishing off shore and hunting. He denies any depression, anxiety, or stressors. He says he sleeps good. He lives with his wife who is his main support person and they have a son and a daughter who also support him. They have 4 grandchildren. He had his mitral valve replaced a few years ago and participated in cardiac rehab at [facility] in [place] so he is familiar with the program. His goals for the program are to lose weight and imrpove his SOB. He would like to lose the 20 lbs he gained since his valve replacement surgery.    Expected Outcomes Short Term goal: Start the program and attend consistently. Long term: Lose weight and improve his SOB.    Continue Psychosocial Services  Follow up required by staff             Psychosocial Re-Evaluation:  Psychosocial Re-Evaluation     Row Name 01/29/23 1030             Psychosocial Re-Evaluation   Current issues with Current Stress Concerns       Comments Tyrie is doing well in rehab.  He denies any major stressors other than his breathing is really not improving too much.  He would like to see a bigger improvement.  He says he can walk, but when he bends over or tries to carry things he is still SOB.  We talked about how bending decreases plueral space and inhibits breathing.  We also talked about being more mindful of not holding his breath when he carries things as most people do.  He was encouraged to continue to use his PLB to help.  He is sleeping well and staying active on the farm.       Expected Outcomes Short: Conitnue to use PLB and try not to do breath holding when carrying things.  Long: conitnue to stay active for mental boost.       Interventions Encouraged to attend Pulmonary Rehabilitation for the exercise       Continue Psychosocial Services  Follow up required by staff                Psychosocial  Discharge (Final Psychosocial Re-Evaluation):  Psychosocial Re-Evaluation - 01/29/23 1030       Psychosocial Re-Evaluation   Current issues with Current Stress Concerns    Comments Crews is doing well in rehab.  He denies any major stressors other than his breathing is really not improving too much.  He would like to see a bigger improvement.  He says he can walk, but when he bends over or tries to carry things he is still SOB.  We talked about how bending decreases plueral space and inhibits breathing.  We also talked about being more mindful of not holding his breath when he carries things as most people do.  He was encouraged to continue to use his PLB to help.  He is sleeping well and staying active on the farm.    Expected Outcomes Short: Conitnue to use PLB and try not to do breath holding when carrying things.  Long: conitnue to stay active for mental boost.    Interventions Encouraged to attend Pulmonary Rehabilitation for the exercise    Continue Psychosocial Services  Follow up required by staff              Education: Education Goals: Education classes will be provided on a weekly basis, covering required topics. Participant will state understanding/return demonstration of topics presented.  Learning Barriers/Preferences:  Learning Barriers/Preferences - 01/05/23 1343       Learning Barriers/Preferences   Learning Barriers None    Learning Preferences Skilled Demonstration             Education Topics: How Lungs Work and Diseases: - Discuss the anatomy of the lungs and diseases that can affect the lungs, such as COPD.   Exercise: -Discuss the importance of exercise, FITT principles of exercise, normal and abnormal responses to exercise, and how to exercise safely.   Environmental Irritants: -Discuss types of environmental irritants and how to limit exposure to environmental irritants.   Meds/Inhalers and oxygen: - Discuss respiratory medications, definition  of an inhaler and oxygen, and the proper way to use an inhaler and oxygen.   Energy Saving Techniques: - Discuss methods to conserve energy and decrease shortness of breath when performing activities of daily living.    Bronchial Hygiene / Breathing Techniques: - Discuss breathing mechanics, pursed-lip breathing technique,  proper posture, effective ways to clear airways, and other functional breathing techniques   Cleaning Equipment: - Provides group verbal and written instruction about the health risks of elevated stress, cause of high stress, and healthy ways to reduce stress.   Nutrition I: Fats: - Discuss the types of cholesterol, what cholesterol does to the body, and how cholesterol levels can be controlled.   Nutrition II: Labels: -Discuss the different components of food labels and how to read food labels.   Respiratory Infections: - Discuss the signs and symptoms of respiratory infections, ways to prevent respiratory infections, and the importance of seeking medical treatment when having a respiratory infection.   Stress I:  Signs and Symptoms: - Discuss the causes of stress, how stress Chad lead to anxiety and depression, and ways to limit stress.   Stress II: Relaxation: -Discuss relaxation techniques to limit stress.   Oxygen for Home/Travel: - Discuss how to prepare for travel when on oxygen and proper ways to transport and store oxygen to ensure safety.   Knowledge Questionnaire Score:  Knowledge Questionnaire Score - 01/10/23 0907       Knowledge Questionnaire Score   Pre Score 9/18             Core Components/Risk Factors/Patient Goals at Admission:  Personal Goals and Risk Factors at Admission - 01/05/23 1344       Core Components/Risk Factors/Patient Goals on Admission    Weight Management Yes    Improve shortness of breath with ADL's Yes    Intervention Provide education, individualized exercise plan and daily activity instruction to help  decrease symptoms of SOB with activities of daily living.    Expected Outcomes Short Term: Improve cardiorespiratory fitness to achieve a reduction of symptoms when performing ADLs;Long Term: Be able to perform more ADLs without symptoms or delay the onset of symptoms    Hypertension Yes    Intervention Provide education on lifestyle modifcations including regular physical activity/exercise, weight management, moderate sodium restriction and increased consumption of fresh fruit, vegetables, and low fat dairy, alcohol moderation, and smoking cessation.;Monitor prescription use compliance.    Expected Outcomes Short Term: Continued assessment and intervention until BP is < 140/72mm HG in hypertensive participants. < 130/58mm HG in hypertensive participants with diabetes, heart failure or chronic kidney disease.;Long Term: Maintenance of blood pressure at goal levels.    Lipids Yes    Intervention Provide education and support for participant on nutrition & aerobic/resistive exercise along with prescribed medications to achieve LDL 70mg , HDL >40mg .    Expected Outcomes Short Term: Participant states understanding of desired cholesterol values and is compliant with medications prescribed. Participant is following exercise prescription and nutrition guidelines.;Long Term: Cholesterol controlled with medications as prescribed, with individualized exercise RX and with personalized nutrition plan. Value goals: LDL < 70mg , HDL > 40 mg.             Core Components/Risk Factors/Patient Goals Review:   Goals and Risk Factor Review     Row Name 01/29/23 1040             Core Components/Risk Factors/Patient Goals Review   Personal Goals Review Weight Management/Obesity;Hypertension;Improve shortness of breath with ADL's       Review Keimon is doing well in rehab.  His weight is starting to come down. He would like to lose more and get back to his 224.  His pressures are doing well, but on the lower side.   He has appointment this week and is taking in his BP report to his doctor to see if they Chad back off on his medication some for blood pressures.  His breathing is good unless he is carrying something or climbing stairs.  We talked about making sure to use PLB and not holding his breathing when he does this.       Expected Outcomes Short: Continue to work on PLB when carrying stuff Long: Continue to work on [employer] loss.                Core Components/Risk Factors/Patient Goals at Discharge (Final Review):   Goals and Risk Factor Review - 01/29/23 1040       Core Components/Risk  Factors/Patient Goals Review   Personal Goals Review Weight Management/Obesity;Hypertension;Improve shortness of breath with ADL's    Review Lemario is doing well in rehab.  His weight is starting to come down. He would like to lose more and get back to his 224.  His pressures are doing well, but on the lower side.  He has appointment this week and is taking in his BP report to his doctor to see if they Chad back off on his medication some for blood pressures.  His breathing is good unless he is carrying something or climbing stairs.  We talked about making sure to use PLB and not holding his breathing when he does this.    Expected Outcomes Short: Continue to work on PLB when carrying stuff Long: Continue to work on [employer] loss.             ITP Comments:  ITP Comments     Row Name 02/07/23 0651           ITP Comments 30 day review completed. ITP sent to Dr.Jehanzeb Memon, Medical Director of  Pulmonary Rehab. Continue with ITP unless changes are made by physician.                Comments: 30 day review

## 2023-02-09 ENCOUNTER — Encounter (HOSPITAL_COMMUNITY): Payer: 59

## 2023-02-12 ENCOUNTER — Encounter (HOSPITAL_COMMUNITY)
Admission: RE | Admit: 2023-02-12 | Discharge: 2023-02-12 | Disposition: A | Discharge status: Home / Self Care | Payer: 59 | Source: Ambulatory Visit | Attending: Pulmonary Disease

## 2023-02-12 DIAGNOSIS — R0600 Dyspnea, unspecified: Secondary | ICD-10-CM

## 2023-02-12 NOTE — Progress Notes (Signed)
Daily Session Note  Patient Details  Name: Chad Berry MRN: [id number] Date of Birth: 04/26/1941 Referring Provider:   Flowsheet Row PULMONARY REHAB OTHER RESP ORIENTATION from 01/10/2023 in [facility] CARDIAC REHABILITATION  Referring Provider Dr. Vassie Loll       Encounter Date: 02/12/2023  Check In:  Session Check In - 02/12/23 0900       Check-In   Supervising physician immediately available to respond to emergencies See telemetry face sheet for immediately available ER MD    Location [facility]    Staff Present Rodena Medin, RN, BSN;Heather Fredric Mare, BS, Exercise Physiologist    Virtual Visit No    Medication changes reported     No    Fall or balance concerns reported    No    Warm-up and Cool-down Performed on first and last piece of equipment    Resistance Training Performed Yes    VAD Patient? No    PAD/SET Patient? No      Pain Assessment   Currently in Pain? No/denies    Multiple Pain Sites No             Capillary Blood Glucose: No results found for this or any previous visit (from the past 24 hour(s)).    Social History   Tobacco Use  Smoking Status Former  Smokeless Tobacco Former   Types: Chew    Goals Met:  Proper associated with RPD/PD & O2 Sat Independence with exercise equipment Using PLB without cueing & demonstrates good technique Exercise tolerated well No report of concerns or symptoms today Strength training completed today  Goals Unmet:  Not Applicable  Comments: Pt able to follow exercise prescription today without complaint.  Will continue to monitor for progression.

## 2023-02-14 ENCOUNTER — Encounter (HOSPITAL_COMMUNITY)
Admission: RE | Admit: 2023-02-14 | Discharge: 2023-02-14 | Disposition: A | Discharge status: Home / Self Care | Payer: 59 | Source: Ambulatory Visit | Attending: Pulmonary Disease

## 2023-02-14 DIAGNOSIS — R0600 Dyspnea, unspecified: Secondary | ICD-10-CM

## 2023-02-14 NOTE — Progress Notes (Signed)
Daily Session Note  Patient Details  Name: Chad Berry MRN: [id number] Date of Birth: 02-Jun-1940 Referring Provider:   Flowsheet Row PULMONARY REHAB OTHER RESP ORIENTATION from 01/10/2023 in [facility] CARDIAC REHABILITATION  Referring Provider Dr. Vassie Loll       Encounter Date: 02/14/2023  Check In:  Session Check In - 02/14/23 0941       Check-In   Supervising physician immediately available to respond to emergencies See telemetry face sheet for immediately available MD    Location [facility]    Staff Present Ross Ludwig, BS, Exercise Physiologist;Jessica [place], MA, RCEP, CCRP, Dow Adolph, RN, BSN    Virtual Visit No    Medication changes reported     No    Fall or balance concerns reported    No    Tobacco Cessation No Change    Warm-up and Cool-down Performed on first and last piece of equipment    Resistance Training Performed Yes    VAD Patient? No    PAD/SET Patient? No      Pain Assessment   Currently in Pain? No/denies    Multiple Pain Sites No             Capillary Blood Glucose: No results found for this or any previous visit (from the past 24 hour(s)).    Social History   Tobacco Use  Smoking Status Former  Smokeless Tobacco Former   Types: Chew    Goals Met:  Independence with exercise equipment Exercise tolerated well No report of concerns or symptoms today Strength training completed today  Goals Unmet:  Not Applicable  Comments: Pt able to follow exercise prescription today without complaint.  Will continue to monitor for progression.

## 2023-02-16 ENCOUNTER — Encounter (HOSPITAL_COMMUNITY): Payer: 59

## 2023-02-19 ENCOUNTER — Encounter (HOSPITAL_COMMUNITY)
Admission: RE | Admit: 2023-02-19 | Discharge: 2023-02-19 | Disposition: A | Discharge status: Home / Self Care | Payer: 59 | Source: Ambulatory Visit | Attending: Pulmonary Disease | Admitting: Pulmonary Disease

## 2023-02-19 DIAGNOSIS — R0600 Dyspnea, unspecified: Secondary | ICD-10-CM | POA: Diagnosis not present

## 2023-02-19 NOTE — Progress Notes (Signed)
Daily Session Note  Patient Details  Name: Chad Berry MRN: [id number] Date of Birth: 02/18/1941 Referring Provider:   Flowsheet Row PULMONARY REHAB OTHER RESP ORIENTATION from 01/10/2023 in [facility] CARDIAC REHABILITATION  Referring Provider Dr. Vassie Loll       Encounter Date: 02/19/2023  Check In:  Session Check In - 02/19/23 0936       Check-In   Supervising physician immediately available to respond to emergencies See telemetry face sheet for immediately available MD    Location [facility]    Staff Present Ross Ludwig, BS, Exercise Physiologist;Cleon Thoma Juanetta Gosling, MA, RCEP, CCRP, CCET;Phyllis Billingsley, RN    Virtual Visit No    Medication changes reported     No    Fall or balance concerns reported    No    Warm-up and Cool-down Performed on first and last piece of equipment    Resistance Training Performed Yes    VAD Patient? No    PAD/SET Patient? No      Pain Assessment   Currently in Pain? No/denies             Capillary Blood Glucose: No results found for this or any previous visit (from the past 24 hour(s)).    Social History   Tobacco Use  Smoking Status Former  Smokeless Tobacco Former   Types: Chew    Goals Met:  Proper associated with RPD/PD & O2 Sat Independence with exercise equipment Using PLB without cueing & demonstrates good technique Exercise tolerated well No report of concerns or symptoms today Strength training completed today  Goals Unmet:  Not Applicable  Comments: Pt able to follow exercise prescription today without complaint.  Will continue to monitor for progression.

## 2023-02-21 ENCOUNTER — Encounter (HOSPITAL_COMMUNITY)
Admission: RE | Admit: 2023-02-21 | Discharge: 2023-02-21 | Disposition: A | Discharge status: Home / Self Care | Payer: 59 | Source: Ambulatory Visit | Attending: Pulmonary Disease | Admitting: Pulmonary Disease

## 2023-02-21 DIAGNOSIS — R0600 Dyspnea, unspecified: Secondary | ICD-10-CM | POA: Diagnosis not present

## 2023-02-21 NOTE — Progress Notes (Signed)
Daily Session Note  Patient Details  Name: Kjon Germani MRN: [id number] Date of Birth: 1940-08-16 Referring Provider:   Flowsheet Row PULMONARY REHAB OTHER RESP ORIENTATION from 01/10/2023 in [facility] CARDIAC REHABILITATION  Referring Provider Dr. Vassie Loll       Encounter Date: 02/21/2023  Check In:  Session Check In - 02/21/23 0944       Check-In   Supervising physician immediately available to respond to emergencies See telemetry face sheet for immediately available MD    Location [facility]    Staff Present Ross Ludwig, BS, Exercise Physiologist;Maziah Keeling Juanetta Gosling, MA, RCEP, CCRP, CCET;Hillary Troutman BSN, RN    Virtual Visit No    Medication changes reported     No    Fall or balance concerns reported    No    Warm-up and Cool-down Performed on first and last piece of equipment    Resistance Training Performed Yes    VAD Patient? No    PAD/SET Patient? No      Pain Assessment   Currently in Pain? No/denies             Capillary Blood Glucose: No results found for this or any previous visit (from the past 24 hour(s)).    Social History   Tobacco Use  Smoking Status Former  Smokeless Tobacco Former   Types: Chew    Goals Met:  Proper associated with RPD/PD & O2 Sat Independence with exercise equipment Using PLB without cueing & demonstrates good technique Exercise tolerated well No report of concerns or symptoms today Strength training completed today  Goals Unmet:  Not Applicable  Comments: Pt able to follow exercise prescription today without complaint.  Will continue to monitor for progression.

## 2023-02-23 ENCOUNTER — Encounter (HOSPITAL_COMMUNITY)
Admission: RE | Admit: 2023-02-23 | Discharge: 2023-02-23 | Disposition: A | Discharge status: Home / Self Care | Payer: 59 | Source: Ambulatory Visit | Attending: Pulmonary Disease | Admitting: Pulmonary Disease

## 2023-02-23 ENCOUNTER — Encounter (HOSPITAL_COMMUNITY): Payer: 59

## 2023-02-23 DIAGNOSIS — R0600 Dyspnea, unspecified: Secondary | ICD-10-CM

## 2023-02-23 NOTE — Progress Notes (Signed)
Daily Session Note  Patient Details  Name: Chad Berry MRN: [id number] Date of Birth: 1940/09/04 Referring Provider:   Flowsheet Row PULMONARY REHAB OTHER RESP ORIENTATION from 01/10/2023 in [facility] CARDIAC REHABILITATION  Referring Provider Dr. Vassie Loll       Encounter Date: 02/23/2023  Check In:  Session Check In - 02/23/23 0915       Check-In   Supervising physician immediately available to respond to emergencies See telemetry face sheet for immediately available MD    Location [facility]    Staff Present [facility] BSN, RN;Jessica [place], [place], RCEP, CCRP, CCET    Virtual Visit No    Medication changes reported     No    Fall or balance concerns reported    No    Tobacco Cessation No Change    Warm-up and Cool-down Performed on first and last piece of equipment    Resistance Training Performed Yes    VAD Patient? No    PAD/SET Patient? No      Pain Assessment   Currently in Pain? No/denies    Multiple Pain Sites No             Capillary Blood Glucose: No results found for this or any previous visit (from the past 24 hour(s)).    Social History   Tobacco Use  Smoking Status Former  Smokeless Tobacco Former   Types: Chew    Goals Met:  Proper associated with RPD/PD & O2 Sat Independence with exercise equipment Using PLB without cueing & demonstrates good technique Exercise tolerated well Queuing for purse lip breathing No report of concerns or symptoms today Strength training completed today  Goals Unmet:  Not Applicable  Comments: Marland KitchenMarland KitchenPt able to follow exercise prescription today without complaint.  Will continue to monitor for progression.

## 2023-02-26 ENCOUNTER — Encounter (HOSPITAL_COMMUNITY)
Admission: RE | Admit: 2023-02-26 | Discharge: 2023-02-26 | Disposition: A | Discharge status: Home / Self Care | Payer: 59 | Source: Ambulatory Visit | Attending: Pulmonary Disease | Admitting: Pulmonary Disease

## 2023-02-26 DIAGNOSIS — R0600 Dyspnea, unspecified: Secondary | ICD-10-CM | POA: Diagnosis not present

## 2023-02-26 NOTE — Progress Notes (Addendum)
Daily Session Note  Patient Details  Name: Chad Berry MRN: [id number] Date of Birth: Jun 12, 1940 Referring Provider:   Flowsheet Row PULMONARY REHAB OTHER RESP ORIENTATION from 01/10/2023 in [facility] CARDIAC REHABILITATION  Referring Provider Dr. Vassie Loll       Encounter Date: 02/26/2023  Check In:  Session Check In - 02/26/23 0940       Check-In   Supervising physician immediately available to respond to emergencies See telemetry face sheet for immediately available MD    Location [facility]    Staff Present Fabio Pierce, MA, RCEP, CCRP, CCET    Staff Present Cyndia Diver, RN, BSN, MA    Virtual Visit No    Medication changes reported     No    Fall or balance concerns reported    No    Warm-up and Cool-down Performed on first and last piece of equipment    Resistance Training Performed Yes    VAD Patient? No    PAD/SET Patient? No      Pain Assessment   Currently in Pain? No/denies             Capillary Blood Glucose: No results found for this or any previous visit (from the past 24 hour(s)).    Social History   Tobacco Use  Smoking Status Former  Smokeless Tobacco Former   Types: Chew    Goals Met:  Proper associated with RPD/PD & O2 Sat Independence with exercise equipment Using PLB without cueing & demonstrates good technique Exercise tolerated well No report of concerns or symptoms today Strength training completed today  Goals Unmet:  Not Applicable  Comments: Pt able to follow exercise prescription today without complaint.  Will continue to monitor for progression.  Chad Berry noted his BP dropped at home on Friday and he called his doctor. He is currently on a BP med holiday to see how it does. He does not feel dizzy today like Friday afternoon.

## 2023-02-28 ENCOUNTER — Encounter (HOSPITAL_COMMUNITY): Payer: 59

## 2023-03-02 ENCOUNTER — Encounter (HOSPITAL_COMMUNITY): Payer: 59

## 2023-03-05 ENCOUNTER — Encounter (HOSPITAL_COMMUNITY)
Admission: RE | Admit: 2023-03-05 | Discharge: 2023-03-05 | Disposition: A | Discharge status: Home / Self Care | Payer: 59 | Source: Ambulatory Visit | Attending: Pulmonary Disease | Admitting: Pulmonary Disease

## 2023-03-05 DIAGNOSIS — R0600 Dyspnea, unspecified: Secondary | ICD-10-CM | POA: Diagnosis present

## 2023-03-05 NOTE — Progress Notes (Signed)
Daily Session Note  Patient Details  Name: Chad Berry MRN: [id number] Date of Birth: 01-30-1941 Referring Provider:   Flowsheet Row PULMONARY REHAB OTHER RESP ORIENTATION from 01/10/2023 in [facility] CARDIAC REHABILITATION  Referring Provider Dr. Vassie Loll       Encounter Date: 03/05/2023  Check In:  Session Check In - 03/05/23 0923       Check-In   Supervising physician immediately available to respond to emergencies See telemetry face sheet for immediately available MD    Location [facility]    Staff Present Ross Ludwig, BS, Exercise Physiologist;Giordano Getman [place], MA, RCEP, CCRP, Dow Adolph, RN, BSN    Virtual Visit No    Medication changes reported     Yes    Comments Pt stopped BP med on Thursday last week, still has not called doctor about it and was encouraged to do so (he was told to call Friday)    Fall or balance concerns reported    No    Warm-up and Cool-down Performed on first and last piece of equipment    Resistance Training Performed Yes    VAD Patient? No    PAD/SET Patient? No      Pain Assessment   Currently in Pain? No/denies             Capillary Blood Glucose: No results found for this or any previous visit (from the past 24 hour(s)).    Social History   Tobacco Use  Smoking Status Former  Smokeless Tobacco Former   Types: Chew    Goals Met:  Proper associated with RPD/PD & O2 Sat Independence with exercise equipment Using PLB without cueing & demonstrates good technique Exercise tolerated well No report of concerns or symptoms today Strength training completed today  Goals Unmet:  Not Applicable  Comments: Pt able to follow exercise prescription today without complaint.  Will continue to monitor for progression.

## 2023-03-07 ENCOUNTER — Encounter (HOSPITAL_COMMUNITY)
Admission: RE | Admit: 2023-03-07 | Discharge: 2023-03-07 | Disposition: A | Discharge status: Home / Self Care | Payer: 59 | Source: Ambulatory Visit | Attending: Pulmonary Disease | Admitting: Pulmonary Disease

## 2023-03-07 ENCOUNTER — Encounter (HOSPITAL_COMMUNITY): Payer: Self-pay | Admitting: *Deleted

## 2023-03-07 DIAGNOSIS — R0600 Dyspnea, unspecified: Secondary | ICD-10-CM

## 2023-03-07 NOTE — Progress Notes (Signed)
Pulmonary Individual Treatment Plan  Patient Details  Name: Chad Berry MRN: [id number] Date of Birth: [birth year] Referring Provider:   Flowsheet Row PULMONARY REHAB OTHER RESP ORIENTATION from 01/10/2023 in [facility] CARDIAC REHABILITATION  Referring Provider Dr. Vassie Loll       Initial Encounter Date:  Flowsheet Row PULMONARY REHAB OTHER RESP ORIENTATION from 01/10/2023 in [facility] CARDIAC REHABILITATION  Date 01/10/23       Visit Diagnosis: Dyspnea, unspecified type  Patient's Home Medications on Admission:   Current Outpatient Medications:    ALPRAZolam (XANAX) 0.5 MG tablet, Take 0.5 mg by mouth at bedtime as needed. (Patient not taking: Reported on 01/05/2023), Disp: , Rfl:    amoxicillin (AMOXIL) 500 MG capsule, Take 4 capsules (2,000mg ) by mouth 30-60 minutes prior to dental work or cleaning., Disp: 4 capsule, Rfl: 3   Ascorbic Acid (VITAMIN C) 1000 MG tablet, Take 1,000 mg by mouth daily. (Patient not taking: Reported on 01/05/2023), Disp: , Rfl:    Cholecalciferol (VITAMIN D) 50 MCG (2000 UT) tablet, Take 2,000 Units by mouth daily., Disp: , Rfl:    Evolocumab (REPATHA SURECLICK) 140 MG/ML SOAJ, Inject 140 mg into the skin every 14 (fourteen) days., Disp: 6 mL, Rfl: 3   ezetimibe (ZETIA) 10 MG tablet, Take 1 tablet (10 mg total) by mouth daily., Disp: 90 tablet, Rfl: 3   famotidine (PEPCID) 40 MG tablet, Take 1 tablet (40 mg total) by mouth at bedtime., Disp: 30 tablet, Rfl: 1   levothyroxine (SYNTHROID) 75 MCG tablet, Take 75 mcg by mouth at bedtime., Disp: , Rfl:    losartan (COZAAR) 50 MG tablet, Take 50 mg by mouth daily., Disp: , Rfl:    tamsulosin (FLOMAX) 0.4 MG CAPS capsule, Take 0.4 mg by mouth at bedtime., Disp: , Rfl:    vitamin B-12 (CYANOCOBALAMIN) 100 MCG tablet, Take 100 mcg by mouth daily., Disp: , Rfl:   Past Medical History: Past Medical History:  Diagnosis Date   Aortic atherosclerosis (HCC)    Bradycardia    while on BB with HR to the 40s   Carotid  stenosis    CAROTID DOPPLER, 12/07/2011 - Mild arthrosclerotic changes, no high-grade stenosis   Cataract    rt. eye   Clostridium difficile infection 05/2019   Diverticulitis    Heart murmur    History of echocardiogram    Echo 9/16:  EF 60-65%, no RWMA, Gr 1 DD, holosystolic MVP of posterior leaflet, mild MR, LA upper limits of normal, normal RVF, mod TR, PASP 50 mmHg   Hypercholesteremia    Hyperlipidemia    Hypertension    Hypothyroidism    MVP (mitral valve prolapse)    a. Echo 12/07/2011 - EF-65-70%, severe mitral regurgitation, moderate-severe tricuspid regurgitation, moderate pulmonary HTN, moderate pulmonic regurgitation;  b. 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3  47.5  43.6  45.5   Bicarbonate 20.0 - 28.0 mmol/L 24.7  25.7  25.5  23.6  19.1  21.1  22.8  24.0  26.9  24.8  25.3  24.7   TCO2 22 - 32 mmol/L 26  27  27   - 20  23  24  22  25  24  28  26  26  27  23  26  26     Acid-base deficit 0.0 - 2.0 mmol/L 1.0  1.0  0.8  5.0  6.0  3.0  1.0  1.0   O2 Saturation % 95.0  77.0  76.0  97.9  99.0  94.0  99.0  100.0  100.0  87.0  100.0  100.0     Details       Multiple values from one day are sorted in reverse-chronological order         Capillary Blood Glucose: Lab Results  Component Value Date   GLUCAP 157 (H) 09/28/2020   GLUCAP 127 (H) 09/24/2020   GLUCAP 124 (H) 09/24/2020   GLUCAP 114 (H) 09/24/2020   GLUCAP 125 (H) 09/24/2020     Pulmonary Assessment Scores:  Pulmonary Assessment Scores     Row Name 01/10/23 0910         ADL UCSD   ADL Phase Entry     SOB Score total 7     Rest 0     Walk 0     Stairs 4     Bath 0     Dress 0     Shop 0       CAT Score   CAT Score 4       mMRC Score   mMRC Score 1             UCSD: Self-administered rating of dyspnea associated with activities of daily living (ADLs) 6-point scale (0 = "not at all" to 5 = "maximal or unable to do because of breathlessness")  Scoring Scores range from 0 to 120.  Minimally important difference is 5 units  CAT: CAT can identify the health impairment of COPD patients and is better correlated with disease progression.  CAT has a scoring range of zero to 40. The CAT score is classified into four groups of low (less than 10), medium (10 - 20), high (21-30) and very high (31-40) based on the impact level of disease on health status. A CAT score over 10 suggests significant symptoms.  A worsening CAT score could be explained by an exacerbation, poor medication adherence, poor inhaler technique, or progression of COPD or comorbid conditions.  CAT MCID is 2 points  mMRC: mMRC (Modified Medical Research Council) Dyspnea Scale is used to assess the degree of baseline functional disability in patients of respiratory disease due to dyspnea. No minimal important difference is established. A decrease in score of 1 point or greater is considered a positive change.    Pulmonary Function Assessment:   Exercise Target Goals: Exercise  Program Goal: Individual exercise prescription set using results from initial 6 min walk test and THRR while considering  patient's activity barriers and safety.   Exercise Prescription Goal: Initial exercise prescription builds to 30-45 minutes a day of aerobic activity, 2-3 days per week.  Home exercise guidelines will be given to patient during program as part of exercise prescription that the participant will acknowledge.  Activity Barriers & Risk Stratification:  Activity Barriers & Cardiac Risk Stratification - 01/05/23 1318       Activity Barriers & Cardiac Risk Stratification   Activity Barriers Shortness of Breath    Cardiac Risk Stratification Moderate             6 Minute Walk:  6 Minute Walk     Row Name 01/10/23 1001         6 Minute Walk   Phase Initial     Distance 1455 feet     Walk Time 6 minutes     # of Rest Breaks 0     MPH 2.75     METS 5.25     RPE 9     Perceived Dyspnea  0     VO2 Peak 18.38     Symptoms No     Resting HR 65 bpm     Resting BP 140/80     Resting Oxygen Saturation  96 %     Exercise Oxygen Saturation  during 6 min walk 95 %     Max Ex. HR 94 bpm     Max Ex. BP 146/80     2 Minute Post BP 140/78       Interval HR   1 Minute HR 84     2 Minute HR 87     3 Minute HR 86     4 Minute HR 90     5 Minute HR 92     6 Minute HR 94     2 Minute Post HR 76     Interval Heart Rate? Yes       Interval Oxygen   Interval Oxygen? Yes     Baseline Oxygen Saturation % 96 %     1 Minute Oxygen Saturation % 98 %     1 Minute Liters of Oxygen 0 L     2 Minute Oxygen Saturation % 95 %     2 Minute Liters of Oxygen 0 L     3 Minute Oxygen Saturation % 96 %     3 Minute Liters of Oxygen 0 L     4 Minute Oxygen Saturation % 96 %     4 Minute Liters of Oxygen 0 L     5 Minute Oxygen Saturation % 96 %     5 Minute Liters of Oxygen 0 L     6 Minute Oxygen  Saturation % 97 %     6 Minute Liters of Oxygen 0 L     2 Minute Post Oxygen Saturation % 97 %     2 Minute Post Liters of Oxygen 0 L              Oxygen Initial Assessment:  Oxygen Initial Assessment - 01/10/23 0911       Home Oxygen   Home Oxygen Device None    Sleep Oxygen Prescription None    Home Exercise Oxygen Prescription None    Home Resting Oxygen Prescription None      Initial 6 min Walk  Oxygen Used None      Program Oxygen Prescription   Program Oxygen Prescription None      Intervention   Short Term Goals To learn and exhibit compliance with exercise, home and travel O2 prescription;To learn and understand importance of monitoring SPO2 with pulse oximeter and demonstrate accurate use of the pulse oximeter.;To learn and understand importance of maintaining oxygen saturations>88%;To learn and demonstrate proper pursed lip breathing techniques or other breathing techniques. ;To learn and demonstrate proper use of respiratory medications    Long  Term Goals Exhibits compliance with exercise, home  and travel O2 prescription;Verbalizes importance of monitoring SPO2 with pulse oximeter and return demonstration;Maintenance of O2 saturations>88%;Exhibits proper breathing techniques, such as pursed lip breathing or other method taught during program session;Compliance with respiratory medication;Demonstrates proper use of MDI's             Oxygen Re-Evaluation:  Oxygen Re-Evaluation     Row Name 01/29/23 1044 02/14/23 1014           Program Oxygen Prescription   Program Oxygen Prescription None None        Home Oxygen   Home Oxygen Device None None      Sleep Oxygen Prescription None None      Home Exercise Oxygen Prescription None None      Home Resting Oxygen Prescription None None        Goals/Expected Outcomes   Short Term Goals To learn and understand importance of monitoring SPO2 with pulse oximeter and demonstrate accurate use of the pulse  oximeter.;To learn and understand importance of maintaining oxygen saturations>88%;To learn and demonstrate proper pursed lip breathing techniques or other breathing techniques.  To learn and understand importance of monitoring SPO2 with pulse oximeter and demonstrate accurate use of the pulse oximeter.;To learn and understand importance of maintaining oxygen saturations>88%;To learn and demonstrate proper pursed lip breathing techniques or other breathing techniques.       Long  Term Goals Verbalizes importance of monitoring SPO2 with pulse oximeter and return demonstration;Maintenance of O2 saturations>88%;Exhibits proper breathing techniques, such as pursed lip breathing or other method taught during program session Verbalizes importance of monitoring SPO2 with pulse oximeter and return demonstration;Maintenance of O2 saturations>88%;Exhibits proper breathing techniques, such as pursed lip breathing or other method taught during program session      Comments Chad Berry is doing well in rehab. He is breathing better walking, but still getting SOB with bending, carrying, and walking up stairs.  We talked about making sure to use his PLB for all three and to also make sure that he is not holding his breath when he does these. He has been working on his PLB and will continue to do so.  He is going to try not to hold his breath when he is carrying things to see if it improves. Chad Berry is doing well in rehab.  He is doing well with his PLB and his SOB has improved.  They only time he is SOB is while carrying things or going upstairs.  His saturations are doing well.      Goals/Expected Outcomes Short: Make sure to not hold breath while carrying items Long: Continue to improve SOB and use PLB Short: Continue to use PLB even while carrying things Long: Continue to improve SOB               Oxygen Discharge (Final Oxygen Re-Evaluation):  Oxygen Re-Evaluation - 02/14/23 1014       Program Oxygen Prescription  Program Oxygen Prescription None      Home Oxygen   Home Oxygen Device None    Sleep Oxygen Prescription None    Home Exercise Oxygen Prescription None    Home Resting Oxygen Prescription None      Goals/Expected Outcomes   Short Term Goals To learn and understand importance of monitoring SPO2 with pulse oximeter and demonstrate accurate use of the pulse oximeter.;To learn and understand importance of maintaining oxygen saturations>88%;To learn and demonstrate proper pursed lip breathing techniques or other breathing techniques.     Long  Term Goals Verbalizes importance of monitoring SPO2 with pulse oximeter and return demonstration;Maintenance of O2 saturations>88%;Exhibits proper breathing techniques, such as pursed lip breathing or other method taught during program session    Comments Chad Berry is doing well in rehab.  He is doing well with his PLB and his SOB has improved.  They only time he is SOB is while carrying things or going upstairs.  His saturations are doing well.    Goals/Expected Outcomes Short: Continue to use PLB even while carrying things Long: Continue to improve SOB             Initial Exercise Prescription:  Initial Exercise Prescription - 01/10/23 1000       Date of Initial Exercise RX and Referring Provider   Date 01/10/23    Referring Provider Dr. Vassie Loll      Oxygen   Maintain Oxygen Saturation 88% or higher      Treadmill   MPH 2.5    Grade 0.5    Minutes 15      REL-XR   Level 3    Speed 60    Minutes 15      Prescription Details   Frequency (times per week) 3    Duration Progress to 30 minutes of continuous aerobic without signs/symptoms of physical distress      Intensity   THRR 40-80% of Max Heartrate 95-125    Ratings of Perceived Exertion 11-13    Perceived Dyspnea 0-4      Resistance Training   Training Prescription Yes    Weight 4    Reps 10-15             Perform Capillary Blood Glucose checks as needed.  Exercise  Prescription Changes:   Exercise Prescription Changes     Row Name 01/22/23 [phone] 01/29/23 1000           Response to Exercise   Blood Pressure (Admit) 110/60 --      Blood Pressure (Exercise) 142/62 --      Blood Pressure (Exit) 116/78 --      Heart Rate (Admit) 70 bpm --      Heart Rate (Exercise) 97 bpm --      Heart Rate (Exit) 75 bpm --      Oxygen Saturation (Admit) 95 % --      Oxygen Saturation (Exercise) 97 % --      Oxygen Saturation (Exit) 96 % --      Rating of Perceived Exertion (Exercise) 12 --      Perceived Dyspnea (Exercise) 0 --      Duration Continue with 30 min of aerobic exercise without signs/symptoms of physical distress. --      Intensity THRR unchanged --        Progression   Progression Continue to progress workloads to maintain intensity without signs/symptoms of physical distress. --        Emergency planning/management officer  Training Prescription Yes --      Weight 4 lbs --      Reps 10-15 --        Treadmill   MPH 3.1 --      Grade 2.5 --      Minutes 15 --      METs 4.44 --        Bike   Level 6 --      Watts 62 --      Minutes 15 --      METs 3.3 --        Home Exercise Plan   Plans to continue exercise at -- Home (comment)  walking      Frequency -- Add 3 additional days to program exercise sessions.      Initial Home Exercises Provided -- 01/29/23        Oxygen   Maintain Oxygen Saturation 88% or higher --               Exercise Comments:   Exercise Comments     Row Name 01/10/23 1005           Exercise Comments Chad Berry completed his walk test today for starting rehab. He went 1455 ft and did great. He will be exercising on treadmill and elliptical.                Exercise Goals and Review:   Exercise Goals     Row Name 01/10/23 1005             Exercise Goals   Increase Physical Activity Yes       Intervention Provide advice, education, support and counseling about physical activity/exercise needs.;Develop an  individualized exercise prescription for aerobic and resistive training based on initial evaluation findings, risk stratification, comorbidities and participant's personal goals.       Expected Outcomes Short Term: Attend rehab on a regular basis to increase amount of physical activity.;Long Term: Exercising regularly at least 3-5 days a week.;Long Term: Add in home exercise to make exercise part of routine and to increase amount of physical activity.       Increase Strength and Stamina Yes       Intervention Provide advice, education, support and counseling about physical activity/exercise needs.;Develop an individualized exercise prescription for aerobic and resistive training based on initial evaluation findings, risk stratification, comorbidities and participant's personal goals.       Expected Outcomes Short Term: Perform resistance training exercises routinely during rehab and add in resistance training at home;Short Term: Increase workloads from initial exercise prescription for resistance, speed, and METs.;Long Term: Improve cardiorespiratory fitness, muscular endurance and strength as measured by increased METs and functional capacity ( )       Able to understand and use rate of perceived exertion (RPE) scale Yes       Intervention Provide education and explanation on how to use RPE scale       Expected Outcomes Short Term: Able to use RPE daily in rehab to express subjective intensity level;Long Term:  Able to use RPE to guide intensity level when exercising independently       Able to understand and use Dyspnea scale Yes       Intervention Provide education and explanation on how to use Dyspnea scale       Expected Outcomes Short Term: Able to use Dyspnea scale daily in rehab to express subjective sense of shortness of breath during exertion;Long Term: Able to use Dyspnea scale  to guide intensity level when exercising independently       Knowledge and understanding of Target Heart Rate Range  (THRR) Yes       Intervention Provide education and explanation of THRR including how the numbers were predicted and where they are located for reference       Expected Outcomes Long Term: Able to use THRR to govern intensity when exercising independently;Short Term: Able to state/look up THRR;Short Term: Able to use daily as guideline for intensity in rehab       Able to check pulse independently Yes       Intervention Provide education and demonstration on how to check pulse in carotid and radial arteries.;Review the importance of being able to check your own pulse for safety during independent exercise       Expected Outcomes Short Term: Able to explain why pulse checking is important during independent exercise;Long Term: Able to check pulse independently and accurately       Understanding of Exercise Prescription Yes       Intervention Provide education, explanation, and written materials on patient's individual exercise prescription       Expected Outcomes Short Term: Able to explain program exercise prescription;Long Term: Able to explain home exercise prescription to exercise independently                Exercise Goals Re-Evaluation :  Exercise Goals Re-Evaluation     Row Name 01/23/23 0909 01/29/23 0957 02/14/23 1004         Exercise Goal Re-Evaluation   Exercise Goals Review Increase Physical Activity;Increase Strength and Stamina;Understanding of Exercise Prescription Increase Physical Activity;Increase Strength and Stamina;Able to understand and use rate of perceived exertion (RPE) scale;Able to understand and use Dyspnea scale;Able to check pulse independently;Knowledge and understanding of Target Heart Rate Range (THRR);Understanding of Exercise Prescription Increase Physical Activity;Increase Strength and Stamina;Understanding of Exercise Prescription     Comments Chad Berry has been doing great in rehab. He has been increasing his workloads each week. He is currently exercising at  level 6 on the bike and walking at 3.1 speed with a 2.5 grade on the treadmill. Will continue to monitor and progress as able. [place].  Reviewed home exercise with pt today.  Pt plans to walk with dogs at home for exercise.  Reviewed THR, pulse, RPE, sign and symptoms, pulse oximetery and when to call 911 or MD.  Also discussed weather considerations and indoor options.  Pt voiced understanding. Chad Berry is doing well in rehab. He is walking on his off days.  He feels like his stamina is improving. He still has a hard time walking upstairs and carrying a load.  We will continue to monitor his progress.     Expected Outcomes Short term: continue to exercise at current workloads    long term: continue to attend rehab Short: Continue to add in walking at home Long: Conitnue to improve breathing. Short: Trying walking while carrying weights to load Long; Conitnue to improve stamina              Discharge Exercise Prescription (Final Exercise Prescription Changes):  Exercise Prescription Changes - 01/29/23 1000       Home Exercise Plan   Plans to continue exercise at Home (comment)   walking   Frequency Add 3 additional days to program exercise sessions.    Initial Home Exercises Provided 01/29/23  Nutrition:  Target Goals: Understanding of nutrition guidelines, daily intake of sodium 1500mg , cholesterol 200mg , calories 30% from fat and 7% or less from saturated fats, daily to have 5 or more servings of fruits and vegetables.  Biometrics:  Pre Biometrics - 01/10/23 1007       Pre Biometrics   Height 5\' 10"  (1.778 m)    Weight 229 lb 0.9 oz (103.9 kg)    Waist Circumference 45 inches    Hip Circumference 42 inches    Waist to Hip Ratio 1.07 %    BMI (Calculated) 32.87    Grip Strength 29.4 kg    Single Leg Stand 60 seconds              Nutrition Therapy Plan and Nutrition Goals:   Nutrition  Assessments:  MEDIFICTS Score Key: >=70 Need to make dietary changes  40-70 Heart Healthy Diet <= 40 Therapeutic Level Cholesterol Diet  Flowsheet Row PULMONARY REHAB OTHER RESP ORIENTATION from 01/10/2023 in [facility] CARDIAC [facility]HABILITATION  Picture Your Plate Total Score on Admission 36      Picture Your Plate Scores: <21 Unhealthy dietary pattern with much room for improvement. 41-50 Dietary pattern unlikely to meet recommendations for good health and room for improvement. 51-60 More healthful dietary pattern, with some room for improvement.  >60 Healthy dietary pattern, although there may be some specific behaviors that could be improved.    Nutrition Goals Re-Evaluation:  Nutrition Goals Re-Evaluation     Row Name 01/29/23 1043 02/14/23 1010           Goals   Nutrition Goal Healthy Eating Plan Short: Continue to add in more fruit and vegetables Long: Continue to focus on healthy eatin g      Comment Chad Berry is doing well in rehab.  He is not follow any particular diet, but he is trying to eat healthier. He is getting a lot of fruits and vegetables.  He has also started to cut back on his sweets.  He is getting enough protein and works on portion control. Chad Berry is doing well in rehab.  He has been working on his diet.  He really is working on portion control and frustrated that he is not losing weight.  He does eat fruits and vegetables.  He continues to try to limit his sweets.      Expected Outcome Short: Continue to add in more fruit and vegetables Long: Continue to focus on healthy eatin g Short: Conitnue to work on portion size and stick to diet through plateau Long: COnitnue to eat healthy               Nutrition Goals Discharge (Final Nutrition Goals Re-Evaluation):  Nutrition Goals Re-Evaluation - 02/14/23 1010       Goals   Nutrition Goal Short: Continue to add in more fruit and vegetables Long: Continue to focus on healthy eatin g    Comment Chad Berry is doing well in  rehab.  He has been working on his diet.  He really is working on portion control and frustrated that he is not losing weight.  He does eat fruits and vegetables.  He continues to try to limit his sweets.    Expected Outcome Short: Conitnue to work on portion size and stick to diet through plateau Long: COnitnue to eat healthy             Psychosocial: Target Goals: Acknowledge presence or absence of significant depression and/or stress, maximize coping skills, provide  positive support system. Participant is able to verbalize types and ability to use techniques and skills needed for reducing stress and depression.  Initial Review & Psychosocial Screening:  Initial Psych Review & Screening - 01/05/23 1345       Initial Review   Current issues with None Identified      Family Dynamics   Good Support System? Yes      Barriers   Psychosocial barriers to participate in program There are no identifiable barriers or psychosocial needs.      Screening Interventions   Interventions Encouraged to exercise;To provide support and resources with identified psychosocial needs;Provide feedback about the scores to participant    Expected Outcomes Short Term goal: Utilizing psychosocial counselor, staff and physician to assist with identification of specific Stressors or current issues interfering with healing process. Setting desired goal for each stressor or current issue identified.;Short Term goal: Identification and review with participant of any Quality of Life or Depression concerns found by scoring the questionnaire.;Long Term Goal: Stressors or current issues are controlled or eliminated.;Long Term goal: The participant improves quality of Life and PHQ9 Scores as seen by post scores and/or verbalization of changes             Quality of Life Scores:  Scores of 19 and below usually indicate a poorer quality of life in these areas.  A difference of  2-3 points is a clinically meaningful  difference.  A difference of 2-3 points in the total score of the Quality of Life Index has been associated with significant improvement in overall quality of life, self-image, physical symptoms, and general health in studies assessing change in quality of life.   PHQ-9: Review Flowsheet       01/10/2023  Depression screen PHQ 2/9  Decreased Interest 0  Down, Depressed, Hopeless 0  PHQ - 2 Score 0  Altered sleeping 0  Tired, decreased energy 2  Change in appetite 0  Feeling bad or failure about yourself  0  Trouble concentrating 0  Moving slowly or fidgety/restless 0  Suicidal thoughts 0  PHQ-9 Score 2  Difficult doing work/chores Not difficult at all    Details           Interpretation of Total Score  Total Score Depression Severity:  1-4 = Minimal depression, 5-9 = Mild depression, 10-14 = Moderate depression, 15-19 = Moderately severe depression, 20-27 = Severe depression   Psychosocial Evaluation and Intervention:  Psychosocial Evaluation - 01/05/23 1348       Psychosocial Evaluation & Interventions   Interventions Stress management education;Relaxation education;Encouraged to exercise with the program and follow exercise prescription    Comments Patient referred to PR with Dyspnea. 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 He says he has been SOB since but it is intermittent. 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 He also enjoys fishing off shore and hunting. He denies any depression, anxiety, or stressors. He says he sleeps good. He lives with his wife who is his main support person and they have a son and a daughter who also support him. They have 4 grandchildren. He had his mitral valve replaced a few years ago and participated in cardiac rehab  at [facility] in [place] so he is familiar with  the program. His goals for the program are to lose weight and imrpove his SOB. He would like to lose the 20 lbs he gained since his valve replacement surgery.    Expected Outcomes Short Term goal: Start the program and attend consistently. Long term: Lose weight and improve his SOB.    Continue Psychosocial Services  Follow up required by staff             Psychosocial Re-Evaluation:  Psychosocial Re-Evaluation     Row Name 01/29/23 1030 02/14/23 1006           Psychosocial Re-Evaluation   Current issues with Current Stress Concerns Current Stress Concerns      Comments Chad Berry is doing well in rehab.  He denies any major stressors other than his breathing is really not improving too much.  He would like to see a bigger improvement.  He says he can walk, but when he bends over or tries to carry things he is still SOB.  We talked about how bending decreases plueral space and inhibits breathing.  We also talked about being more mindful of not holding his breath when he carries things as most people do.  He was encouraged to continue to use his PLB to help.  He is sleeping well and staying active on the farm. Chad Berry is doing well in rehab.  He denies any major stressors.  He still has difficulty going up stairs especially while loaded.  He just finds this frustrating as is limits what he is able to do.  He continues to sleep well and takes one trip to bathroom around 4am but able to get back to sleep easily.      Expected Outcomes Short: Conitnue to use PLB and try not to do breath holding when carrying things.  Long: conitnue to stay active for mental boost. Short: Conitnue to work on carrying things Long: continue to exercise      Interventions Encouraged to attend Pulmonary Rehabilitation for the exercise Encouraged to attend Pulmonary Rehabilitation for the exercise      Continue Psychosocial Services  Follow up required by staff Follow up required  by staff               Psychosocial Discharge (Final Psychosocial Re-Evaluation):  Psychosocial Re-Evaluation - 02/14/23 1006       Psychosocial Re-Evaluation   Current issues with Current Stress Concerns    Comments Chad Berry is doing well in rehab.  He denies any major stressors.  He still has difficulty going up stairs especially while loaded.  He just finds this frustrating as is limits what he is able to do.  He continues to sleep well and takes one trip to bathroom around 4am but able to get back to sleep easily.    Expected Outcomes Short: Conitnue to work on carrying things Long: continue to exercise    Interventions Encouraged to attend Pulmonary Rehabilitation for the exercise    Continue Psychosocial Services  Follow up required by staff              Education: Education Goals: Education classes will be provided on a weekly basis, covering required topics. Participant will state understanding/return demonstration of topics presented.  Learning Barriers/Preferences:  Learning Barriers/Preferences - 01/05/23 1343       Learning Barriers/Preferences   Learning Barriers None    Learning Preferences Skilled Demonstration             Education Topics: How Lungs Work and Diseases: -  Discuss the anatomy of the lungs and diseases that can affect the lungs, such as COPD.   Exercise: -Discuss the importance of exercise, FITT principles of exercise, normal and abnormal responses to exercise, and how to exercise safely.   Environmental Irritants: -Discuss types of environmental irritants and how to limit exposure to environmental irritants.   Meds/Inhalers and oxygen: - Discuss respiratory medications, definition of an inhaler and oxygen, and the proper way to use an inhaler and oxygen.   Energy Saving Techniques: - Discuss methods to conserve energy and decrease shortness of breath when performing activities of daily living.    Bronchial Hygiene / Breathing  Techniques: - Discuss breathing mechanics, pursed-lip breathing technique,  proper posture, effective ways to clear airways, and other functional breathing techniques   Cleaning Equipment: - Provides group verbal and written instruction about the health risks of elevated stress, cause of high stress, and healthy ways to reduce stress.   Nutrition I: Fats: - Discuss the types of cholesterol, what cholesterol does to the body, and how cholesterol levels can be controlled. Flowsheet Row PULMONARY REHAB OTHER RESPIRATORY from 02/23/2023 in [place]  Date 02/15/23  Educator jh  Instruction Review Code 1- Verbalizes Understanding       Nutrition II: Labels: -Discuss the different components of food labels and how to read food labels. Flowsheet Row PULMONARY REHAB OTHER RESPIRATORY from 02/23/2023 in [place] [facility]  Date 02/14/23  Educator [facility]  Instruction Review Code 1- Verbalizes Understanding       Respiratory Infections: - Discuss the signs and symptoms of respiratory infections, ways to prevent respiratory infections, and the importance of seeking medical treatment when having a respiratory infection.   Stress I: Signs and Symptoms: - Discuss the causes of stress, how stress may lead to anxiety and depression, and ways to limit stress. Flowsheet Row PULMONARY REHAB OTHER RESPIRATORY from 02/23/2023 in [place] [facility]  Date 02/21/23  Educator [facility]  Instruction Review Code 1- Verbalizes Understanding       Stress II: Relaxation: -Discuss relaxation techniques to limit stress. Flowsheet Row PULMONARY REHAB OTHER RESPIRATORY from 02/23/2023 in [place] [facility]  Date 02/21/23  Educator [facility]  Instruction Review Code 1- Verbalizes Understanding       Oxygen for Home/Travel: - Discuss how to prepare for travel when on oxygen and proper ways to transport and store oxygen to ensure  safety.   Knowledge Questionnaire Score:  Knowledge Questionnaire Score - 01/10/23 0907       Knowledge Questionnaire Score   Pre Score 9/18             Core Components/Risk Factors/Patient Goals at Admission:  Personal Goals and Risk Factors at Admission - 01/05/23 1344       Core Components/Risk Factors/Patient Goals on Admission    Weight Management Yes    Improve shortness of breath with ADL's Yes    Intervention Provide education, individualized exercise plan and daily activity instruction to help decrease symptoms of SOB with activities of daily living.    Expected Outcomes Short Term: Improve cardiorespiratory fitness to achieve a reduction of symptoms when performing ADLs;Long Term: Be able to perform more ADLs without symptoms or delay the onset of symptoms    Hypertension Yes    Intervention Provide education on lifestyle modifcations including regular physical activity/exercise, weight management, moderate sodium restriction and increased consumption of fresh fruit, vegetables, and low fat dairy, alcohol moderation, and smoking cessation.;Monitor prescription use compliance.  Expected Outcomes Short Term: Continued assessment and intervention until BP is < 140/70mm HG in hypertensive participants. < 130/60mm HG in hypertensive participants with diabetes, heart failure or chronic kidney disease.;Long Term: Maintenance of blood pressure at goal levels.    Lipids Yes    Intervention Provide education and support for participant on nutrition & aerobic/resistive exercise along with prescribed medications to achieve LDL 70mg , HDL >40mg .    Expected Outcomes Short Term: Participant states understanding of desired cholesterol values and is compliant with medications prescribed. Participant is following exercise prescription and nutrition guidelines.;Long Term: Cholesterol controlled with medications as prescribed, with individualized exercise RX and with personalized nutrition  plan. Value goals: LDL < 70mg , HDL > 40 mg.             Core Components/Risk Factors/Patient Goals Review:   Goals and Risk Factor Review     Row Name 01/29/23 1040 02/14/23 1013           Core Components/Risk Factors/Patient Goals Review   Personal Goals Review Weight Management/Obesity;Hypertension;Improve shortness of breath with ADL's Weight Management/Obesity;Hypertension;Improve shortness of breath with ADL's      Review Chad Berry is doing well in rehab.  His weight is starting to come down. He would like to lose more and get back to his 224.  His pressures are doing well, but on the lower side.  He has appointment this week and is taking in his BP report to his doctor to see if they may back off on his medication some for blood pressures.  His breathing is good unless he is carrying something or climbing stairs.  We talked about making sure to use PLB and not holding his breathing when he does this. Chad Berry is doing well in rehab.  His weight has been holding steady despite more exercise and diet changes.  He is frustrated by this but was encouraged to stick with it longer.  His pressures are doing well.  He is doing well with his meds.  His breathing has improved other than while weighted and going up stairs still.  He was encouraged to conitnue to work on this.      Expected Outcomes Short: Continue to work on PLB when carrying stuff Long: Continue to work on [employer] loss. Short: Contineu to work on carrying things Long: Conitnue to monitor risk factors               Core Components/Risk Factors/Patient Goals at Discharge (Final Review):   Goals and Risk Factor Review - 02/14/23 1013       Core Components/Risk Factors/Patient Goals Review   Personal Goals Review Weight Management/Obesity;Hypertension;Improve shortness of breath with ADL's    Review Chad Berry is doing well in rehab.  His weight has been holding steady despite more exercise and diet changes.  He is frustrated by this but was  encouraged to stick with it longer.  His pressures are doing well.  He is doing well with his meds.  His breathing has improved other than while weighted and going up stairs still.  He was encouraged to conitnue to work on this.    Expected Outcomes Short: Contineu to work on carrying things Long: Conitnue to monitor risk factors             ITP Comments:  ITP Comments     Row Name 02/07/23 0651 02/26/23 0940 03/07/23 0652       ITP Comments 30 day review completed. ITP sent to Dr.Jehanzeb Memon, Medical Director of  Pulmonary Rehab. Continue with ITP unless changes are made by physician. Chad Berry noted his BP dropped at home on Friday and he called his doctor.  He is currently on a BP med holiday to see how it does.  He does not feel dizzy today like Friday afternoon. 30 day review completed. ITP sent to Dr.Jehanzeb Memon, Medical Director of  Pulmonary Rehab. Continue with ITP unless changes are made by physician.              Comments: 30 day review

## 2023-03-07 NOTE — Progress Notes (Signed)
Daily Session Note  Patient Details  Name: Chad Berry MRN: [id number] Date of Birth: Feb 23, 1941 Referring Provider:   Flowsheet Row PULMONARY REHAB OTHER RESP ORIENTATION from 01/10/2023 in [facility] CARDIAC REHABILITATION  Referring Provider Dr. Vassie Loll       Encounter Date: 03/07/2023  Check In:  Session Check In - 03/07/23 0915       Check-In   Supervising physician immediately available to respond to emergencies See telemetry face sheet for immediately available MD    Location [facility]    Staff Present Ross Ludwig, BS, Exercise Physiologist;Jessica Juanetta Gosling, MA, RCEP, CCRP, CCET;Other    Virtual Visit No    Medication changes reported     No    Fall or balance concerns reported    No    Tobacco Cessation No Change    Warm-up and Cool-down Performed on first and last piece of equipment    Resistance Training Performed Yes    VAD Patient? No    PAD/SET Patient? No      Pain Assessment   Currently in Pain? No/denies    Multiple Pain Sites No             Capillary Blood Glucose: No results found for this or any previous visit (from the past 24 hour(s)).    Social History   Tobacco Use  Smoking Status Former  Smokeless Tobacco Former   Types: Chew    Goals Met:  Independence with exercise equipment Exercise tolerated well No report of concerns or symptoms today Strength training completed today  Goals Unmet:  Not Applicable  Comments: Pt able to follow exercise prescription today without complaint.  Will continue to monitor for progression.

## 2023-03-09 ENCOUNTER — Encounter (HOSPITAL_COMMUNITY): Payer: 59

## 2023-03-12 ENCOUNTER — Encounter (HOSPITAL_COMMUNITY)
Admission: RE | Admit: 2023-03-12 | Discharge: 2023-03-12 | Disposition: A | Discharge status: Home / Self Care | Payer: 59 | Source: Ambulatory Visit | Attending: Pulmonary Disease | Admitting: Pulmonary Disease

## 2023-03-12 DIAGNOSIS — R0600 Dyspnea, unspecified: Secondary | ICD-10-CM

## 2023-03-12 NOTE — Progress Notes (Signed)
Daily Session Note  Patient Details  Name: Loyed Vandrunen MRN: [id number] Date of Birth: 1941/03/17 Referring Provider:   Flowsheet Row PULMONARY REHAB OTHER RESP ORIENTATION from 01/10/2023 in [facility] CARDIAC REHABILITATION  Referring Provider Dr. Vassie Loll       Encounter Date: 03/12/2023  Check In:  Session Check In - 03/12/23 0900       Check-In   Supervising physician immediately available to respond to emergencies See telemetry face sheet for immediately available ER MD    Location [facility]    Staff Present Rodena Medin, RN, BSN;Jessica Hawkins, MA, RCEP, CCRP, CCET    Medication changes reported     No    Fall or balance concerns reported    No    Warm-up and Cool-down Performed on first and last piece of equipment    Resistance Training Performed Yes    VAD Patient? No    PAD/SET Patient? No      Pain Assessment   Currently in Pain? No/denies    Multiple Pain Sites No             Capillary Blood Glucose: No results found for this or any previous visit (from the past 24 hour(s)).    Social History   Tobacco Use  Smoking Status Former  Smokeless Tobacco Former   Types: Chew    Goals Met:  Proper associated with RPD/PD & O2 Sat Independence with exercise equipment Using PLB without cueing & demonstrates good technique Exercise tolerated well No report of concerns or symptoms today Strength training completed today  Goals Unmet:  Not Applicable  Comments: Pt able to follow exercise prescription today without complaint.  Will continue to monitor for progression.

## 2023-03-14 ENCOUNTER — Ambulatory Visit: Payer: 59 | Admitting: Orthopaedic Surgery

## 2023-03-14 ENCOUNTER — Encounter (HOSPITAL_COMMUNITY)
Admission: RE | Admit: 2023-03-14 | Discharge: 2023-03-14 | Disposition: A | Discharge status: Home / Self Care | Payer: 59 | Source: Ambulatory Visit | Attending: Pulmonary Disease | Admitting: Pulmonary Disease

## 2023-03-14 DIAGNOSIS — R0600 Dyspnea, unspecified: Secondary | ICD-10-CM

## 2023-03-14 NOTE — Progress Notes (Signed)
Daily Session Note  Patient Details  Name: Chad Berry MRN: [id number] Date of Birth: 03-Dec-1940 Referring Provider:   Flowsheet Row PULMONARY REHAB OTHER RESP ORIENTATION from 01/10/2023 in [facility] CARDIAC REHABILITATION  Referring Provider Dr. Vassie Loll       Encounter Date: 03/14/2023  Check In:  Session Check In - 03/14/23 0945       Check-In   Supervising physician immediately available to respond to emergencies See telemetry face sheet for immediately available MD    Location [facility]    Staff Present Ross Ludwig, BS, Exercise Physiologist;Manuel Dall Juanetta Gosling, MA, RCEP, CCRP, CCET;Other   Ula Lingo RN   Virtual Visit No    Medication changes reported     No    Fall or balance concerns reported    No    Warm-up and Cool-down Performed on first and last piece of equipment    Resistance Training Performed Yes    VAD Patient? No    PAD/SET Patient? No      Pain Assessment   Currently in Pain? No/denies             Capillary Blood Glucose: No results found for this or any previous visit (from the past 24 hour(s)).    Social History   Tobacco Use  Smoking Status Former  Smokeless Tobacco Former   Types: Chew    Goals Met:  Proper associated with RPD/PD & O2 Sat Independence with exercise equipment Using PLB without cueing & demonstrates good technique Exercise tolerated well No report of concerns or symptoms today Strength training completed today  Goals Unmet:  Not Applicable  Comments: Pt able to follow exercise prescription today without complaint.  Will continue to monitor for progression.

## 2023-03-16 ENCOUNTER — Encounter (HOSPITAL_COMMUNITY)
Admission: RE | Admit: 2023-03-16 | Discharge: 2023-03-16 | Disposition: A | Discharge status: Home / Self Care | Payer: 59 | Source: Ambulatory Visit | Attending: Pulmonary Disease

## 2023-03-16 ENCOUNTER — Encounter (HOSPITAL_COMMUNITY): Payer: 59

## 2023-03-16 DIAGNOSIS — R0600 Dyspnea, unspecified: Secondary | ICD-10-CM

## 2023-03-16 NOTE — Progress Notes (Signed)
Daily Session Note  Patient Details  Name: Chad Berry MRN: [id number] Date of Birth: Jun 11, 1940 Referring Provider:   Flowsheet Row PULMONARY REHAB OTHER RESP ORIENTATION from 01/10/2023 in [facility] CARDIAC REHABILITATION  Referring Provider Dr. Vassie Loll       Encounter Date: 03/16/2023  Check In:  Session Check In - 03/16/23 0916       Check-In   Supervising physician immediately available to respond to emergencies See telemetry face sheet for immediately available MD    Location [facility]    Staff Present Ross Ludwig, BS, Exercise Physiologist;Debra Laural Benes, RN, Thomos Lemons, MA, RCEP, CCRP, CCET    Virtual Visit No    Medication changes reported     No    Fall or balance concerns reported    No    Warm-up and Cool-down Performed on first and last piece of equipment    Resistance Training Performed Yes    VAD Patient? No    PAD/SET Patient? No      Pain Assessment   Currently in Pain? No/denies             Capillary Blood Glucose: No results found for this or any previous visit (from the past 24 hour(s)).    Social History   Tobacco Use  Smoking Status Former  Smokeless Tobacco Former   Types: Chew    Goals Met:  Proper associated with RPD/PD & O2 Sat Independence with exercise equipment Using PLB without cueing & demonstrates good technique Exercise tolerated well No report of concerns or symptoms today Strength training completed today  Goals Unmet:  Not Applicable  Comments: Pt able to follow exercise prescription today without complaint.  Will continue to monitor for progression.

## 2023-03-19 ENCOUNTER — Encounter (HOSPITAL_COMMUNITY)
Admission: RE | Admit: 2023-03-19 | Discharge: 2023-03-19 | Disposition: A | Discharge status: Home / Self Care | Payer: 59 | Source: Ambulatory Visit | Attending: Pulmonary Disease | Admitting: Pulmonary Disease

## 2023-03-19 DIAGNOSIS — R0600 Dyspnea, unspecified: Secondary | ICD-10-CM | POA: Diagnosis not present

## 2023-03-19 NOTE — Progress Notes (Signed)
Daily Session Note  Patient Details  Name: Chad Berry MRN: [id number] Date of Birth: 1941/01/18 Referring Provider:   Flowsheet Row PULMONARY REHAB OTHER RESP ORIENTATION from 01/10/2023 in [facility] CARDIAC REHABILITATION  Referring Provider Dr. Vassie Loll       Encounter Date: 03/19/2023  Check In:  Session Check In - 03/19/23 0915       Check-In   Supervising physician immediately available to respond to emergencies See telemetry face sheet for immediately available MD    Location [facility]    Staff Present Ross Ludwig, BS, Exercise Physiologist;Jessica Juanetta Gosling, MA, RCEP, CCRP, CCET    Virtual Visit No    Medication changes reported     No    Fall or balance concerns reported    No    Tobacco Cessation No Change    Warm-up and Cool-down Performed on first and last piece of equipment    Resistance Training Performed Yes    VAD Patient? No    PAD/SET Patient? No      Pain Assessment   Currently in Pain? No/denies    Multiple Pain Sites No             Capillary Blood Glucose: No results found for this or any previous visit (from the past 24 hour(s)).    Social History   Tobacco Use  Smoking Status Former  Smokeless Tobacco Former   Types: Chew    Goals Met:  Independence with exercise equipment Exercise tolerated well No report of concerns or symptoms today Strength training completed today  Goals Unmet:  Not Applicable  Comments: Pt able to follow exercise prescription today without complaint.  Will continue to monitor for progression.

## 2023-03-21 ENCOUNTER — Encounter (HOSPITAL_COMMUNITY)
Admission: RE | Admit: 2023-03-21 | Discharge: 2023-03-21 | Disposition: A | Discharge status: Home / Self Care | Payer: 59 | Source: Ambulatory Visit | Attending: Pulmonary Disease

## 2023-03-21 DIAGNOSIS — R0600 Dyspnea, unspecified: Secondary | ICD-10-CM | POA: Diagnosis not present

## 2023-03-21 NOTE — Progress Notes (Signed)
Daily Session Note  Patient Details  Name: Chad Berry MRN: [id number] Date of Birth: 03-12-1941 Referring Provider:   Flowsheet Row PULMONARY REHAB OTHER RESP ORIENTATION from 01/10/2023 in [facility] CARDIAC REHABILITATION  Referring Provider Dr. Vassie Loll       Encounter Date: 03/21/2023  Check In:  Session Check In - 03/21/23 0939       Check-In   Supervising physician immediately available to respond to emergencies See telemetry face sheet for immediately available MD    Location [facility]    Staff Present Ross Ludwig, BS, Exercise Physiologist;Virgal Warmuth Juanetta Gosling, MA, RCEP, CCRP, CCET;Other   Sherrye Payor RN   Virtual Visit No    Medication changes reported     No    Warm-up and Cool-down Performed on first and last piece of equipment    Resistance Training Performed Yes    VAD Patient? No    PAD/SET Patient? No      Pain Assessment   Currently in Pain? No/denies             Capillary Blood Glucose: No results found for this or any previous visit (from the past 24 hour(s)).    Social History   Tobacco Use  Smoking Status Former  Smokeless Tobacco Former   Types: Chew    Goals Met:  Proper associated with RPD/PD & O2 Sat Independence with exercise equipment Exercise tolerated well No report of concerns or symptoms today Strength training completed today  Goals Unmet:  Not Applicable  Comments: Pt able to follow exercise prescription today without complaint.  Will continue to monitor for progression.

## 2023-03-23 ENCOUNTER — Encounter (HOSPITAL_COMMUNITY): Payer: 59

## 2023-03-23 ENCOUNTER — Encounter (HOSPITAL_COMMUNITY)
Admission: RE | Admit: 2023-03-23 | Discharge: 2023-03-23 | Disposition: A | Discharge status: Home / Self Care | Payer: 59 | Source: Ambulatory Visit | Attending: Pulmonary Disease

## 2023-03-23 DIAGNOSIS — R0600 Dyspnea, unspecified: Secondary | ICD-10-CM

## 2023-03-23 NOTE — Progress Notes (Signed)
Daily Session Note  Patient Details  Name: Elena Ruberg MRN: [id number] Date of Birth: 1940/12/10 Referring Provider:   Flowsheet Row PULMONARY REHAB OTHER RESP ORIENTATION from 01/10/2023 in [facility] CARDIAC REHABILITATION  Referring Provider Dr. Vassie Loll       Encounter Date: 03/23/2023  Check In:  Session Check In - 03/23/23 0924       Check-In   Supervising physician immediately available to respond to emergencies See telemetry face sheet for immediately available MD    Location [facility]    Staff Present Ross Ludwig, BS, Exercise Physiologist;Montarius Kitagawa Juanetta Gosling, MA, RCEP, CCRP, Dow Adolph, RN, BSN    Virtual Visit No    Medication changes reported     No    Fall or balance concerns reported    No    Warm-up and Cool-down Performed on first and last piece of equipment    Resistance Training Performed Yes    VAD Patient? No    PAD/SET Patient? No      Pain Assessment   Currently in Pain? No/denies             Capillary Blood Glucose: No results found for this or any previous visit (from the past 24 hour(s)).    Social History   Tobacco Use  Smoking Status Former  Smokeless Tobacco Former   Types: Chew    Goals Met:  Proper associated with RPD/PD & O2 Sat Independence with exercise equipment Using PLB without cueing & demonstrates good technique Exercise tolerated well No report of concerns or symptoms today Strength training completed today  Goals Unmet:  Not Applicable  Comments: Pt able to follow exercise prescription today without complaint.  Will continue to monitor for progression.

## 2023-03-26 ENCOUNTER — Encounter (HOSPITAL_COMMUNITY): Payer: 59

## 2023-03-28 ENCOUNTER — Encounter (HOSPITAL_COMMUNITY): Payer: 59

## 2023-04-02 ENCOUNTER — Encounter (HOSPITAL_COMMUNITY)
Admission: RE | Admit: 2023-04-02 | Discharge: 2023-04-02 | Disposition: A | Discharge status: Home / Self Care | Payer: 59 | Source: Ambulatory Visit | Attending: Pulmonary Disease | Admitting: Pulmonary Disease

## 2023-04-02 ENCOUNTER — Encounter (HOSPITAL_COMMUNITY): Payer: 59

## 2023-04-02 DIAGNOSIS — R0600 Dyspnea, unspecified: Secondary | ICD-10-CM | POA: Insufficient documentation

## 2023-04-02 NOTE — Progress Notes (Signed)
Daily Session Note  Patient Details  Name: Chad Berry MRN: [id number] Date of Birth: 1940-08-04 Referring Provider:   Flowsheet Row PULMONARY REHAB OTHER RESP ORIENTATION from 01/10/2023 in [facility] CARDIAC REHABILITATION  Referring Provider Dr. Vassie Loll       Encounter Date: 04/02/2023  Check In:  Session Check In - 04/02/23 0900       Check-In   Supervising physician immediately available to respond to emergencies See telemetry face sheet for immediately available ER MD    Location [facility]    Staff Present Rodena Medin, RN, BSN;Heather Fredric Mare, BS, Exercise Physiologist    Virtual Visit No    Medication changes reported     No    Fall or balance concerns reported    No    Warm-up and Cool-down Performed on first and last piece of equipment    Resistance Training Performed Yes    VAD Patient? No    PAD/SET Patient? No      Pain Assessment   Currently in Pain? No/denies    Multiple Pain Sites No             Capillary Blood Glucose: No results found for this or any previous visit (from the past 24 hour(s)).    Social History   Tobacco Use  Smoking Status Former  Smokeless Tobacco Former   Types: Chew    Goals Met:  Proper associated with RPD/PD & O2 Sat Independence with exercise equipment Using PLB without cueing & demonstrates good technique Exercise tolerated well No report of concerns or symptoms today Strength training completed today  Goals Unmet:  Not Applicable  Comments: Pt able to follow exercise prescription today without complaint.  Will continue to monitor for progression.

## 2023-04-04 ENCOUNTER — Encounter (HOSPITAL_COMMUNITY): Payer: 59

## 2023-04-04 ENCOUNTER — Encounter (HOSPITAL_COMMUNITY): Payer: Self-pay | Admitting: *Deleted

## 2023-04-04 DIAGNOSIS — R0600 Dyspnea, unspecified: Secondary | ICD-10-CM

## 2023-04-04 NOTE — Progress Notes (Signed)
Pulmonary Individual Treatment Plan  Patient Details  Name: Chad Berry MRN: [id number] Date of Birth: 28-Jan-1941 Referring Provider:   Flowsheet Row PULMONARY REHAB OTHER RESP ORIENTATION from 01/10/2023 in [facility] CARDIAC REHABILITATION  Referring Provider Dr. Vassie Loll       Initial Encounter Date:  Flowsheet Row PULMONARY REHAB OTHER RESP ORIENTATION from 01/10/2023 in [facility] CARDIAC REHABILITATION  Date 01/10/23       Visit Diagnosis: Dyspnea, unspecified type  Patient's Home Medications on Admission:   Current Outpatient Medications:    ALPRAZolam (XANAX) 0.5 MG tablet, Take 0.5 mg by mouth at bedtime as needed. (Patient not taking: Reported on 01/05/2023), Disp: , Rfl:    amoxicillin (AMOXIL) 500 MG capsule, Take 4 capsules (2,000mg ) by mouth 30-60 minutes prior to dental work or cleaning., Disp: 4 capsule, Rfl: 3   Ascorbic Acid (VITAMIN C) 1000 MG tablet, Take 1,000 mg by mouth daily. (Patient not taking: Reported on 01/05/2023), Disp: , Rfl:    Cholecalciferol (VITAMIN D) 50 MCG (2000 UT) tablet, Take 2,000 Units by mouth daily., Disp: , Rfl:    Evolocumab (REPATHA SURECLICK) 140 MG/ML SOAJ, Inject 140 mg into the skin every 14 (fourteen) days., Disp: 6 mL, Rfl: 3   ezetimibe (ZETIA) 10 MG tablet, Take 1 tablet (10 mg total) by mouth daily., Disp: 90 tablet, Rfl: 3   famotidine (PEPCID) 40 MG tablet, Take 1 tablet (40 mg total) by mouth at bedtime., Disp: 30 tablet, Rfl: 1   levothyroxine (SYNTHROID) 75 MCG tablet, Take 75 mcg by mouth at bedtime., Disp: , Rfl:    losartan (COZAAR) 50 MG tablet, Take 50 mg by mouth daily., Disp: , Rfl:    tamsulosin (FLOMAX) 0.4 MG CAPS capsule, Take 0.4 mg by mouth at bedtime., Disp: , Rfl:    vitamin B-12 (CYANOCOBALAMIN) 100 MCG tablet, Take 100 mcg by mouth daily., Disp: , Rfl:   Past Medical History: Past Medical History:  Diagnosis Date   Aortic atherosclerosis (HCC)    Bradycardia    while on BB with HR to the 40s   Carotid  stenosis    CAROTID DOPPLER, 12/07/2011 - Mild arthrosclerotic changes, no high-grade stenosis   Cataract    rt. eye   Clostridium difficile infection 05/2019   Diverticulitis    Heart murmur    History of echocardiogram    Echo 9/16:  EF 60-65%, no RWMA, Gr 1 DD, holosystolic MVP of posterior leaflet, mild MR, LA upper limits of normal, normal RVF, mod TR, PASP 50 mmHg   Hypercholesteremia    Hyperlipidemia    Hypertension    Hypothyroidism    MVP (mitral valve prolapse)    a. Echo 12/07/2011 - EF-65-70%, severe mitral regurgitation, moderate-severe tricuspid regurgitation, moderate pulmonary HTN, moderate pulmonic regurgitation;  b. 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3  47.5  43.6  45.5   Bicarbonate 20.0 - 28.0 mmol/L 24.7  25.7  25.5  23.6  19.1  21.1  22.8  24.0  26.9  24.8  25.3  24.7   TCO2 22 - 32 mmol/L 26  27  27   - 20  23  24  22  25  24  28  26  26  27  23  26  26     Acid-base deficit 0.0 - 2.0 mmol/L 1.0  1.0  0.8  5.0  6.0  3.0  1.0  1.0   O2 Saturation % 95.0  77.0  76.0  97.9  99.0  94.0  99.0  100.0  100.0  87.0  100.0  100.0     Details       Multiple values from one day are sorted in reverse-chronological order         Capillary Blood Glucose: Lab Results  Component Value Date   GLUCAP 157 (H) 09/28/2020   GLUCAP 127 (H) 09/24/2020   GLUCAP 124 (H) 09/24/2020   GLUCAP 114 (H) 09/24/2020   GLUCAP 125 (H) 09/24/2020     Pulmonary Assessment Scores:  Pulmonary Assessment Scores     Row Name 01/10/23 0910         ADL UCSD   ADL Phase Entry     SOB Score total 7     Rest 0     Walk 0     Stairs 4     Bath 0     Dress 0     Shop 0       CAT Score   CAT Score 4       mMRC Score   mMRC Score 1             UCSD: Self-administered rating of dyspnea associated with activities of daily living (ADLs) 6-point scale (0 = "not at all" to 5 = "maximal or unable to do because of breathlessness")  Scoring Scores range from 0 to 120.  Minimally important difference is 5 units  CAT: CAT can identify the health impairment of COPD patients and is better correlated with disease progression.  CAT has a scoring range of zero to 40. The CAT score is classified into four groups of low (less than 10), medium (10 - 20), high (21-30) and very high (31-40) based on the impact level of disease on health status. A CAT score over 10 suggests significant symptoms.  A worsening CAT score could be explained by an exacerbation, poor medication adherence, poor inhaler technique, or progression of COPD or comorbid conditions.  CAT MCID is 2 points  mMRC: mMRC (Modified Medical Research Council) Dyspnea Scale is used to assess the degree of baseline functional disability in patients of respiratory disease due to dyspnea. No minimal important difference is established. A decrease in score of 1 point or greater is considered a positive change.    Pulmonary Function Assessment:   Exercise Target Goals: Exercise  Program Goal: Individual exercise prescription set using results from initial 6 min walk test and THRR while considering  patient's activity barriers and safety.   Exercise Prescription Goal: Initial exercise prescription builds to 30-45 minutes a day of aerobic activity, 2-3 days per week.  Home exercise guidelines will be given to patient during program as part of exercise prescription that the participant will acknowledge.  Activity Barriers & Risk Stratification:  Activity Barriers & Cardiac Risk Stratification - 01/05/23 1318       Activity Barriers & Cardiac Risk Stratification   Activity Barriers Shortness of Breath    Cardiac Risk Stratification Moderate             6 Minute Walk:  6 Minute Walk     Row Name 01/10/23 1001         6 Minute Walk   Phase Initial     Distance 1455 feet     Walk Time 6 minutes     # of Rest Breaks 0     MPH 2.75     METS 5.25     RPE 9     Perceived Dyspnea  0     VO2 Peak 18.38     Symptoms No     Resting HR 65 bpm     Resting BP 140/80     Resting Oxygen Saturation  96 %     Exercise Oxygen Saturation  during 6 min walk 95 %     Max Ex. HR 94 bpm     Max Ex. BP 146/80     2 Minute Post BP 140/78       Interval HR   1 Minute HR 84     2 Minute HR 87     3 Minute HR 86     4 Minute HR 90     5 Minute HR 92     6 Minute HR 94     2 Minute Post HR 76     Interval Heart Rate? Yes       Interval Oxygen   Interval Oxygen? Yes     Baseline Oxygen Saturation % 96 %     1 Minute Oxygen Saturation % 98 %     1 Minute Liters of Oxygen 0 L     2 Minute Oxygen Saturation % 95 %     2 Minute Liters of Oxygen 0 L     3 Minute Oxygen Saturation % 96 %     3 Minute Liters of Oxygen 0 L     4 Minute Oxygen Saturation % 96 %     4 Minute Liters of Oxygen 0 L     5 Minute Oxygen Saturation % 96 %     5 Minute Liters of Oxygen 0 L     6 Minute Oxygen  Saturation % 97 %     6 Minute Liters of Oxygen 0 L     2 Minute Post Oxygen Saturation % 97 %     2 Minute Post Liters of Oxygen 0 L              Oxygen Initial Assessment:  Oxygen Initial Assessment - 01/10/23 0911       Home Oxygen   Home Oxygen Device None    Sleep Oxygen Prescription None    Home Exercise Oxygen Prescription None    Home Resting Oxygen Prescription None      Initial 6 min Walk  Oxygen Used None      Program Oxygen Prescription   Program Oxygen Prescription None      Intervention   Short Term Goals To learn and exhibit compliance with exercise, home and travel O2 prescription;To learn and understand importance of monitoring SPO2 with pulse oximeter and demonstrate accurate use of the pulse oximeter.;To learn and understand importance of maintaining oxygen saturations>88%;To learn and demonstrate proper pursed lip breathing techniques or other breathing techniques. ;To learn and demonstrate proper use of respiratory medications    Long  Term Goals Exhibits compliance with exercise, home  and travel O2 prescription;Verbalizes importance of monitoring SPO2 with pulse oximeter and return demonstration;Maintenance of O2 saturations>88%;Exhibits proper breathing techniques, such as pursed lip breathing or other method taught during program session;Compliance with respiratory medication;Demonstrates proper use of MDI's             Oxygen Re-Evaluation:  Oxygen Re-Evaluation     Row Name 01/29/23 1044 02/14/23 1014 03/16/23 0940         Program Oxygen Prescription   Program Oxygen Prescription None None None       Home Oxygen   Home Oxygen Device None None None     Sleep Oxygen Prescription None None None     Home Exercise Oxygen Prescription None None None     Home Resting Oxygen Prescription None None None       Goals/Expected Outcomes   Short Term Goals To learn and understand importance of monitoring SPO2 with pulse oximeter and demonstrate  accurate use of the pulse oximeter.;To learn and understand importance of maintaining oxygen saturations>88%;To learn and demonstrate proper pursed lip breathing techniques or other breathing techniques.  To learn and understand importance of monitoring SPO2 with pulse oximeter and demonstrate accurate use of the pulse oximeter.;To learn and understand importance of maintaining oxygen saturations>88%;To learn and demonstrate proper pursed lip breathing techniques or other breathing techniques.  To learn and understand importance of monitoring SPO2 with pulse oximeter and demonstrate accurate use of the pulse oximeter.;To learn and understand importance of maintaining oxygen saturations>88%;To learn and demonstrate proper pursed lip breathing techniques or other breathing techniques.      Long  Term Goals Verbalizes importance of monitoring SPO2 with pulse oximeter and return demonstration;Maintenance of O2 saturations>88%;Exhibits proper breathing techniques, such as pursed lip breathing or other method taught during program session Verbalizes importance of monitoring SPO2 with pulse oximeter and return demonstration;Maintenance of O2 saturations>88%;Exhibits proper breathing techniques, such as pursed lip breathing or other method taught during program session Verbalizes importance of monitoring SPO2 with pulse oximeter and return demonstration;Maintenance of O2 saturations>88%;Exhibits proper breathing techniques, such as pursed lip breathing or other method taught during program session     Comments Chad Berry is doing well in rehab. He is breathing better walking, but still getting SOB with bending, carrying, and walking up stairs.  We talked about making sure to use his PLB for all three and to also make sure that he is not holding his breath when he does these. He has been working on his PLB and will continue to do so.  He is going to try not to hold his breath when he is carrying things to see if it improves.  Chad Berry is doing well in rehab.  He is doing well with his PLB and his SOB has improved.  They only time he is SOB is while carrying things or going upstairs.  His saturations are doing well. Chad Berry is doing well in rehab.  He is working on PLB. He has noticed a slight increase in his breathing when taking in groceries the other day, he did not need to stop to take breaks when walking up his stairs.     Goals/Expected Outcomes Short: Make sure to not hold breath while carrying items Long: Continue to improve SOB and use PLB Short: Continue to use PLB even while carrying things Long: Continue to improve SOB Short: Continue to use PLB even while carrying things Long: Continue to improve SOB              Oxygen Discharge (Final Oxygen Re-Evaluation):  Oxygen Re-Evaluation - 03/16/23 0940       Program Oxygen Prescription   Program Oxygen Prescription None      Home Oxygen   Home Oxygen Device None    Sleep Oxygen Prescription None    Home Exercise Oxygen Prescription None    Home Resting Oxygen Prescription None      Goals/Expected Outcomes   Short Term Goals To learn and understand importance of monitoring SPO2 with pulse oximeter and demonstrate accurate use of the pulse oximeter.;To learn and understand importance of maintaining oxygen saturations>88%;To learn and demonstrate proper pursed lip breathing techniques or other breathing techniques.     Long  Term Goals Verbalizes importance of monitoring SPO2 with pulse oximeter and return demonstration;Maintenance of O2 saturations>88%;Exhibits proper breathing techniques, such as pursed lip breathing or other method taught during program session    Comments Chad Berry is doing well in rehab. He is working on PLB. He has noticed a slight increase in his breathing when taking in groceries the other day, he did not need to stop to take breaks when walking up his stairs.    Goals/Expected Outcomes Short: Continue to use PLB even while carrying things Long:  Continue to improve SOB             Initial Exercise Prescription:  Initial Exercise Prescription - 01/10/23 1000       Date of Initial Exercise RX and Referring Provider   Date 01/10/23    Referring Provider Dr. Vassie Loll      Oxygen   Maintain Oxygen Saturation 88% or higher      Treadmill   MPH 2.5    Grade 0.5    Minutes 15      REL-XR   Level 3    Speed 60    Minutes 15      Prescription Details   Frequency (times per week) 3    Duration Progress to 30 minutes of continuous aerobic without signs/symptoms of physical distress      Intensity   THRR 40-80% of Max Heartrate 95-125    Ratings of Perceived Exertion 11-13    Perceived Dyspnea 0-4      Resistance Training   Training Prescription Yes    Weight 4    Reps 10-15             Perform Capillary Blood Glucose checks as needed.  Exercise Prescription Changes:   Exercise Prescription Changes     Row Name 01/22/23 0907 01/29/23 1000 03/12/23 0800 03/19/23 1500 04/02/23 1200     Response to Exercise   Blood Pressure (Admit) 110/60 -- 124/62 120/64 120/60   Blood Pressure (Exercise) 142/62 -- -- -- --   Blood Pressure (Exit) 116/78 -- 120/60 104/66 120/64   Heart Rate (Admit) 70 bpm -- 66 bpm 70 bpm 74 bpm   Heart Rate (Exercise) 97 bpm -- 97  bpm 90 bpm 88 bpm   Heart Rate (Exit) 75 bpm -- 73 bpm 78 bpm 82 bpm   Oxygen Saturation (Admit) 95 % -- 96 % 96 % 95 %   Oxygen Saturation (Exercise) 97 % -- 97 % 97 % 96 %   Oxygen Saturation (Exit) 96 % -- 94 % 96 % 96 %   Rating of Perceived Exertion (Exercise) 12 -- 13 12 12    Perceived Dyspnea (Exercise) 0 -- 0 1 0   Duration Continue with 30 min of aerobic exercise without signs/symptoms of physical distress. -- Continue with 30 min of aerobic exercise without signs/symptoms of physical distress. Continue with 30 min of aerobic exercise without signs/symptoms of physical distress. Continue with 30 min of aerobic exercise without signs/symptoms of physical  distress.   Intensity THRR unchanged -- THRR unchanged THRR unchanged THRR unchanged     Progression   Progression Continue to progress workloads to maintain intensity without signs/symptoms of physical distress. -- Continue to progress workloads to maintain intensity without signs/symptoms of physical distress. Continue to progress workloads to maintain intensity without signs/symptoms of physical distress. Continue to progress workloads to maintain intensity without signs/symptoms of physical distress.     Resistance Training   Training Prescription Yes -- Yes Yes Yes   Weight 4 lbs -- 8 lbs 8lbs 8lbs   Reps 10-15 -- 10-15 10-15 10-15     Treadmill   MPH 3.1 -- 3.3 2.7 2.6   Grade 2.5 -- 2 2 2    Minutes 15 -- 15 15 15    METs 4.44 -- 4.43 3.81 3.71     Bike   Level 6 -- 6 6 --   Watts 62 -- 81 78 --   Minutes 15 -- 15 15 --   METs 3.3 -- 3.4 3.1 --     Home Exercise Plan   Plans to continue exercise at -- Home (comment)  walking Home (comment) Home (comment) --   Frequency -- Add 3 additional days to program exercise sessions. Add 3 additional days to program exercise sessions. Add 3 additional days to program exercise sessions. --   Initial Home Exercises Provided -- 01/29/23 -- -- --     Oxygen   Maintain Oxygen Saturation 88% or higher -- 88% or higher 88% or higher --            Exercise Comments:   Exercise Comments     Row Name 01/10/23 1005           Exercise Comments Chad Berry completed his walk test today for starting rehab. He went 1455 ft and did great. He will be exercising on treadmill and elliptical.                Exercise Goals and Review:   Exercise Goals     Row Name 01/10/23 1005             Exercise Goals   Increase Physical Activity Yes       Intervention Provide advice, education, support and counseling about physical activity/exercise needs.;Develop an individualized exercise prescription for aerobic and resistive training based on  initial evaluation findings, risk stratification, comorbidities and participant's personal goals.       Expected Outcomes Short Term: Attend rehab on a regular basis to increase amount of physical activity.;Long Term: Exercising regularly at least 3-5 days a week.;Long Term: Add in home exercise to make exercise part of routine and to increase amount of physical activity.  Increase Strength and Stamina Yes       Intervention Provide advice, education, support and counseling about physical activity/exercise needs.;Develop an individualized exercise prescription for aerobic and resistive training based on initial evaluation findings, risk stratification, comorbidities and participant's personal goals.       Expected Outcomes Short Term: Perform resistance training exercises routinely during rehab and add in resistance training at home;Short Term: Increase workloads from initial exercise prescription for resistance, speed, and METs.;Long Term: Improve cardiorespiratory fitness, muscular endurance and strength as measured by increased METs and functional capacity ( )       Able to understand and use rate of perceived exertion (RPE) scale Yes       Intervention Provide education and explanation on how to use RPE scale       Expected Outcomes Short Term: Able to use RPE daily in rehab to express subjective intensity level;Long Term:  Able to use RPE to guide intensity level when exercising independently       Able to understand and use Dyspnea scale Yes       Intervention Provide education and explanation on how to use Dyspnea scale       Expected Outcomes Short Term: Able to use Dyspnea scale daily in rehab to express subjective sense of shortness of breath during exertion;Long Term: Able to use Dyspnea scale to guide intensity level when exercising independently       Knowledge and understanding of Target Heart Rate Range (THRR) Yes       Intervention Provide education and explanation of THRR  including how the numbers were predicted and where they are located for reference       Expected Outcomes Long Term: Able to use THRR to govern intensity when exercising independently;Short Term: Able to state/look up THRR;Short Term: Able to use daily as guideline for intensity in rehab       Able to check pulse independently Yes       Intervention Provide education and demonstration on how to check pulse in carotid and radial arteries.;Review the importance of being able to check your own pulse for safety during independent exercise       Expected Outcomes Short Term: Able to explain why pulse checking is important during independent exercise;Long Term: Able to check pulse independently and accurately       Understanding of Exercise Prescription Yes       Intervention Provide education, explanation, and written materials on patient's individual exercise prescription       Expected Outcomes Short Term: Able to explain program exercise prescription;Long Term: Able to explain home exercise prescription to exercise independently                Exercise Goals Re-Evaluation :  Exercise Goals Re-Evaluation     Row Name 01/23/23 0909 01/29/23 0957 02/14/23 1004 03/14/23 0802 03/16/23 0919     Exercise Goal Re-Evaluation   Exercise Goals Review Increase Physical Activity;Increase Strength and Stamina;Understanding of Exercise Prescription Increase Physical Activity;Increase Strength and Stamina;Able to understand and use rate of perceived exertion (RPE) scale;Able to understand and use Dyspnea scale;Able to check pulse independently;Knowledge and understanding of Target Heart Rate Range (THRR);Understanding of Exercise Prescription Increase Physical Activity;Increase Strength and Stamina;Understanding of Exercise Prescription Increase Physical Activity;Able to understand and use rate of perceived exertion (RPE) scale;Understanding of Exercise Prescription Increase Physical Activity;Increase Strength  and Stamina;Understanding of Exercise Prescription   Comments Chad Berry has been doing great in rehab. He has been increasing his workloads each week.  He is currently exercising at level 6 on the bike and walking at 3.1 speed with a 2.5 grade on the treadmill. Will continue to monitor and progress as able. Chad [place] with [place].  Reviewed home exercise with pt today.  Pt plans to walk with dogs at home for exercise.  Reviewed THR, pulse, RPE, sign and symptoms, pulse oximetery and when to call 911 or MD.  Also discussed weather considerations and indoor options.  Pt voiced understanding. Chad Berry is doing well in rehab. He is walking on his off days.  He feels like his stamina is improving. He still has a hard time walking upstairs and carrying a load.  We will continue to monitor his progress. Chad Berry is doing well with exercise. [place]. He is exercise at a MET on 4.43 on the treadmill. He is walking at a speed of 3.3 and an incline of 2.0. Will continue to monitor and progress as able. Chad Berry is doing well in rehab. [place]. He is still having trouble with doing stairs and inclines but has noticed a slight improvment when taking in his groceries the other day. He is increasing workloads on both the bike and treadmill.   Expected Outcomes Short term: continue to exercise at current workloads    long term: continue to attend rehab Short: Continue to add in walking at home Long: Conitnue to improve breathing. Short: Trying walking while carrying weights to load Long; Conitnue to improve stamina Short: increase grade or walk with weights long term: continue to attend rehab Short: increase grade or walk with weights long term: continue to attend rehab    Row Name 03/19/23 1556             Exercise Goal Re-Evaluation   Exercise Goals Review Increase Physical Activity;Increase Strength and Stamina;Understanding of  Exercise Prescription       Comments Chad Berry is tolerating exericse well. He is increasing his speed and grade on the treadmill each class. He is exericsing at 3.81 METs on the treadmill. Will continue to monitor and progress as aable.       Expected Outcomes Short: increase grade or walk with weights long term: continue to attend rehab                Discharge Exercise Prescription (Final Exercise Prescription Changes):  Exercise Prescription Changes - 04/02/23 1200       Response to Exercise   Blood Pressure (Admit) 120/60    Blood Pressure (Exit) 120/64    Heart Rate (Admit) 74 bpm    Heart Rate (Exercise) 88 bpm    Heart Rate (Exit) 82 bpm    Oxygen Saturation (Admit) 95 %    Oxygen Saturation (Exercise) 96 %    Oxygen Saturation (Exit) 96 %    Rating of Perceived Exertion (Exercise) 12    Perceived Dyspnea (Exercise) 0    Duration Continue with 30 min of aerobic exercise without signs/symptoms of physical distress.    Intensity THRR unchanged      Progression   Progression Continue to progress workloads to maintain intensity without signs/symptoms of physical distress.      Resistance Training   Training Prescription Yes    Weight 8lbs    Reps 10-15      Treadmill   MPH 2.6    Grade 2    Minutes 15  METs 3.71             Nutrition:  Target Goals: Understanding of nutrition guidelines, daily intake of sodium 1500mg , cholesterol 200mg , calories 30% from fat and 7% or less from saturated fats, daily to have 5 or more servings of fruits and vegetables.  Biometrics:  Pre Biometrics - 01/10/23 1007       Pre Biometrics   Height 5\' 10"  (1.778 m)    Weight 229 lb 0.9 oz (103.9 kg)    Waist Circumference 45 inches    Hip Circumference 42 inches    Waist to Hip Ratio 1.07 %    BMI (Calculated) 32.87    Grip Strength 29.4 kg    Single Leg Stand 60 seconds              Nutrition Therapy Plan and Nutrition Goals:   Nutrition  Assessments:  MEDIFICTS Score Key: >=70 Need to make dietary changes  40-70 Heart Healthy Diet <= 40 Therapeutic Level Cholesterol Diet  Flowsheet Row PULMONARY REHAB OTHER RESP ORIENTATION from 01/10/2023 in [facility] CARDIAC REH[facility]ILITATION  Picture Your Plate Total Score on Admission 36      Picture Your Plate Scores: <57 Unhealthy dietary pattern with much room for improvement. 41-50 Dietary pattern unlikely to meet recommendations for good health and room for improvement. 51-60 More healthful dietary pattern, with some room for improvement.  >60 Healthy dietary pattern, although there may be some specific behaviors that could be improved.    Nutrition Goals Re-Evaluation:  Nutrition Goals Re-Evaluation     Row Name 01/29/23 1043 02/14/23 1010 03/16/23 0931         Goals   Nutrition Goal Healthy Eating Plan Short: Continue to add in more fruit and vegetables Long: Continue to focus on healthy eatin g Healthy eating     Comment Chad Berry is doing well in rehab.  He is not follow any particular diet, but he is trying to eat healthier. He is getting a lot of fruits and vegetables.  He has also started to cut back on his sweets.  He is getting enough protein and works on portion control. Chad Berry is doing well in rehab.  He has been working on his diet.  He really is working on portion control and frustrated that he is not losing weight.  He does eat fruits and vegetables.  He continues to try to limit his sweets. Chad Berry is doing well with his eating. He stated that he is trying to eat more veggies and is working on portion control. He was wanting to lose weight and has been working on eating healthier. He has been limiting his sweets.     Expected Outcome Short: Continue to add in more fruit and vegetables Long: Continue to focus on healthy eatin g Short: Conitnue to work on portion size and stick to diet through plateau Long: COnitnue to eat healthy Short: continue to work on portion control. Long  term: continue to eat healthy              Nutrition Goals Discharge (Final Nutrition Goals Re-Evaluation):  Nutrition Goals Re-Evaluation - 03/16/23 0931       Goals   Nutrition Goal Healthy eating    Comment Chad Berry is doing well with his eating. He stated that he is trying to eat more veggies and is working on portion control. He was wanting to lose weight and has been working on eating healthier. He has been limiting his sweets.  Expected Outcome Short: continue to work on portion control. Long term: continue to eat healthy             Psychosocial: Target Goals: Acknowledge presence or absence of significant depression and/or stress, maximize coping skills, provide positive support system. Participant is able to verbalize types and ability to use techniques and skills needed for reducing stress and depression.  Initial Review & Psychosocial Screening:  Initial Psych Review & Screening - 01/05/23 1345       Initial Review   Current issues with None Identified      Family Dynamics   Good Support System? Yes      Barriers   Psychosocial barriers to participate in program There are no identifiable barriers or psychosocial needs.      Screening Interventions   Interventions Encouraged to exercise;To provide support and resources with identified psychosocial needs;Provide feedback about the scores to participant    Expected Outcomes Short Term goal: Utilizing psychosocial counselor, staff and physician to assist with identification of specific Stressors or current issues interfering with healing process. Setting desired goal for each stressor or current issue identified.;Short Term goal: Identification and review with participant of any Quality of Life or Depression concerns found by scoring the questionnaire.;Long Term Goal: Stressors or current issues are controlled or eliminated.;Long Term goal: The participant improves quality of Life and PHQ9 Scores as seen by post scores  and/or verbalization of changes             Quality of Life Scores:  Scores of 19 and below usually indicate a poorer quality of life in these areas.  A difference of  2-3 points is a clinically meaningful difference.  A difference of 2-3 points in the total score of the Quality of Life Index has been associated with significant improvement in overall quality of life, self-image, physical symptoms, and general health in studies assessing change in quality of life.   PHQ-9: Review Flowsheet       01/10/2023  Depression screen PHQ 2/9  Decreased Interest 0  Down, Depressed, Hopeless 0  PHQ - 2 Score 0  Altered sleeping 0  Tired, decreased energy 2  Change in appetite 0  Feeling bad or failure about yourself  0  Trouble concentrating 0  Moving slowly or fidgety/restless 0  Suicidal thoughts 0  PHQ-9 Score 2  Difficult doing work/chores Not difficult at all    Details           Interpretation of Total Score  Total Score Depression Severity:  1-4 = Minimal depression, 5-9 = Mild depression, 10-14 = Moderate depression, 15-19 = Moderately severe depression, 20-27 = Severe depression   Psychosocial Evaluation and Intervention:  Psychosocial Evaluation - 01/05/23 1348       Psychosocial Evaluation & Interventions   Interventions Stress management education;Relaxation education;Encouraged to exercise with the program and follow exercise prescription    Comments Patient referred to PR with Dyspnea. 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 He says he has been SOB since but it is intermittent. 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 He also enjoys fishing off shore and hunting. He denies any depression, anxiety, or stressors. He says he sleeps good. He  lives with his wife who is his  main support person and they have a son and a daughter who also support him. They have 4 grandchildren. He had his mitral valve replaced a few years ago and participated in cardiac rehab at [facility] in [place] so he is familiar with the program. His goals for the program are to lose weight and imrpove his SOB. He would like to lose the 20 lbs he gained since his valve replacement surgery.    Expected Outcomes Short Term goal: Start the program and attend consistently. Long term: Lose weight and improve his SOB.    Continue Psychosocial Services  Follow up required by staff             Psychosocial Re-Evaluation:  Psychosocial Re-Evaluation     Row Name 01/29/23 1030 02/14/23 1006 03/16/23 0924         Psychosocial Re-Evaluation   Current issues with Current Stress Concerns Current Stress Concerns Current Stress Concerns     Comments Chad Berry is doing well in rehab.  He denies any major stressors other than his breathing is really not improving too much.  He would like to see a bigger improvement.  He says he can walk, but when he bends over or tries to carry things he is still SOB.  We talked about how bending decreases plueral space and inhibits breathing.  We also talked about being more mindful of not holding his breath when he carries things as most people do.  He was encouraged to continue to use his PLB to help.  He is sleeping well and staying active on the farm. Chad Berry is doing well in rehab.  He denies any major stressors.  He still has difficulty going up stairs especially while loaded.  He just finds this frustrating as is limits what he is able to do.  He continues to sleep well and takes one trip to bathroom around 4am but able to get back to sleep easily. Chad Berry is doing well in rehab. He denies any major stressors excepts getting older. He is still having some difficulty with stairs and SOB but has noticed some improvment. He continues to sleep well and  maybe gets up through the night to use the bathroom.     Expected Outcomes Short: Conitnue to use PLB and try not to do breath holding when carrying things.  Long: conitnue to stay active for mental boost. Short: Conitnue to work on carrying things Long: continue to exercise Short: Conitnue to work on carrying things Long: continue to exercise     Interventions Encouraged to attend Pulmonary Rehabilitation for the exercise Encouraged to attend Pulmonary Rehabilitation for the exercise Encouraged to attend Pulmonary Rehabilitation for the exercise;Stress management education;Relaxation education     Continue Psychosocial Services  Follow up required by staff Follow up required by staff Follow up required by staff              Psychosocial Discharge (Final Psychosocial Re-Evaluation):  Psychosocial Re-Evaluation - 03/16/23 0924       Psychosocial Re-Evaluation   Current issues with Current Stress Concerns    Comments Chad Berry is doing well in rehab. He denies any major stressors excepts getting older. He is still having some difficulty with stairs and SOB but has noticed some improvment. He continues to sleep well and maybe gets up through the night to use the bathroom.    Expected Outcomes Short: Conitnue to work on carrying things Long: continue to exercise    Interventions Encouraged to attend Pulmonary Rehabilitation for  the exercise;Stress management education;Relaxation education    Continue Psychosocial Services  Follow up required by staff              Education: Education Goals: Education classes will be provided on a weekly basis, covering required topics. Participant will state understanding/return demonstration of topics presented.  Learning Barriers/Preferences:  Learning Barriers/Preferences - 01/05/23 1343       Learning Barriers/Preferences   Learning Barriers None    Learning Preferences Skilled Demonstration             Education Topics: How Lungs Work and  Diseases: - Discuss the anatomy of the lungs and diseases that can affect the lungs, such as COPD. Flowsheet Row PULMONARY REHAB OTHER RESPIRATORY from 03/21/2023 in [facility]  Date 03/14/23  Educator [facility]  Instruction Review Code 1- Verbalizes Understanding       Exercise: -Discuss the importance of exercise, FITT principles of exercise, normal and abnormal responses to exercise, and how to exercise safely.   Environmental Irritants: -Discuss types of environmental irritants and how to limit exposure to environmental irritants.   Meds/Inhalers and oxygen: - Discuss respiratory medications, definition of an inhaler and oxygen, and the proper way to use an inhaler and oxygen.   Energy Saving Techniques: - Discuss methods to conserve energy and decrease shortness of breath when performing activities of daily living.    Bronchial Hygiene / Breathing Techniques: - Discuss breathing mechanics, pursed-lip breathing technique,  proper posture, effective ways to clear airways, and other functional breathing techniques   Cleaning Equipment: - Provides group verbal and written instruction about the health risks of elevated stress, cause of high stress, and healthy ways to reduce stress.   Nutrition I: Fats: - Discuss the types of cholesterol, what cholesterol does to the body, and how cholesterol levels can be controlled. Flowsheet Row PULMONARY REHAB OTHER RESPIRATORY from 03/21/2023 in [facility]  Date 02/15/23  Educator [facility]  Instruction Review Code 1- Verbalizes Understanding       Nutrition II: Labels: -Discuss the different components of food labels and how to read food labels. Flowsheet Row PULMONARY REHAB OTHER RESPIRATORY from 03/21/2023 in [facility]  Date 02/14/23  Educator [facility]  Instruction Review Code 1- Verbalizes Understanding       Respiratory Infections: - Discuss the signs and symptoms of  respiratory infections, ways to prevent respiratory infections, and the importance of seeking medical treatment when having a respiratory infection.   Stress I: Signs and Symptoms: - Discuss the causes of stress, how stress may lead to anxiety and depression, and ways to limit stress. Flowsheet Row PULMONARY REHAB OTHER RESPIRATORY from 03/21/2023 in [place] [place] [facility]  Date 02/21/23  Educator [facility]  Instruction Review Code 1- Verbalizes Understanding       Stress II: Relaxation: -Discuss relaxation techniques to limit stress. Flowsheet Row PULMONARY REHAB OTHER RESPIRATORY from 03/21/2023 in [facility]  Date 02/21/23  Educator [facility]  Instruction Review Code 1- Verbalizes Understanding       Oxygen for Home/Travel: - Discuss how to prepare for travel when on oxygen and proper ways to transport and store oxygen to ensure safety.   Knowledge Questionnaire Score:  Knowledge Questionnaire Score - 01/10/23 0907       Knowledge Questionnaire Score   Pre Score 9/18             Core Components/Risk Factors/Patient Goals at Admission:  Personal Goals and Risk Factors at Admission -  01/05/23 1344       Core Components/Risk Factors/Patient Goals on Admission    Weight Management Yes    Improve shortness of breath with ADL's Yes    Intervention Provide education, individualized exercise plan and daily activity instruction to help decrease symptoms of SOB with activities of daily living.    Expected Outcomes Short Term: Improve cardiorespiratory fitness to achieve a reduction of symptoms when performing ADLs;Long Term: Be able to perform more ADLs without symptoms or delay the onset of symptoms    Hypertension Yes    Intervention Provide education on lifestyle modifcations including regular physical activity/exercise, weight management, moderate sodium restriction and increased consumption of fresh fruit, vegetables, and low fat dairy, alcohol  moderation, and smoking cessation.;Monitor prescription use compliance.    Expected Outcomes Short Term: Continued assessment and intervention until BP is < 140/74mm HG in hypertensive participants. < 130/43mm HG in hypertensive participants with diabetes, heart failure or chronic kidney disease.;Long Term: Maintenance of blood pressure at goal levels.    Lipids Yes    Intervention Provide education and support for participant on nutrition & aerobic/resistive exercise along with prescribed medications to achieve LDL 70mg , HDL >40mg .    Expected Outcomes Short Term: Participant states understanding of desired cholesterol values and is compliant with medications prescribed. Participant is following exercise prescription and nutrition guidelines.;Long Term: Cholesterol controlled with medications as prescribed, with individualized exercise RX and with personalized nutrition plan. Value goals: LDL < 70mg , HDL > 40 mg.             Core Components/Risk Factors/Patient Goals Review:   Goals and Risk Factor Review     Row Name 01/29/23 1040 02/14/23 1013 03/16/23 0939         Core Components/Risk Factors/Patient Goals Review   Personal Goals Review Weight Management/Obesity;Hypertension;Improve shortness of breath with ADL's Weight Management/Obesity;Hypertension;Improve shortness of breath with ADL's Weight Management/Obesity;Hypertension;Improve shortness of breath with ADL's     Review Chad Berry is doing well in rehab.  His weight is starting to come down. He would like to lose more and get back to his 224.  His pressures are doing well, but on the lower side.  He has appointment this week and is taking in his BP report to his doctor to see if they may back off on his medication some for blood pressures.  His breathing is good unless he is carrying something or climbing stairs.  We talked about making sure to use PLB and not holding his breathing when he does this. Chad Berry is doing well in rehab.  His  weight has been holding steady despite more exercise and diet changes.  He is frustrated by this but was encouraged to stick with it longer.  His pressures are doing well.  He is doing well with his meds.  His breathing has improved other than while weighted and going up stairs still.  He was encouraged to conitnue to work on this. Chad Berry weight has been steady. He has been walking and eating helathier to work on weight maintance and loss. His pressure is doing wel and his breathing has improved. He has seen slight improvemnt when walking up stairs.     Expected Outcomes Short: Continue to work on PLB when carrying stuff Long: Continue to work on [employer] loss. Short: Contineu to work on carrying things Long: Conitnue to monitor risk factors Short: Contineu to work on carrying things Long: Conitnue to monitor risk factors  Core Components/Risk Factors/Patient Goals at Discharge (Final Review):   Goals and Risk Factor Review - 03/16/23 0939       Core Components/Risk Factors/Patient Goals Review   Personal Goals Review Weight Management/Obesity;Hypertension;Improve shortness of breath with ADL's    Review Chad Berry weight has been steady. He has been walking and eating helathier to work on weight maintance and loss. His pressure is doing wel and his breathing has improved. He has seen slight improvemnt when walking up stairs.    Expected Outcomes Short: Contineu to work on carrying things Long: Conitnue to monitor risk factors             ITP Comments:  ITP Comments     Row Name 02/07/23 0651 02/26/23 0940 03/07/23 0652 04/04/23 0813     ITP Comments 30 day review completed. ITP sent to Dr.Jehanzeb Memon, Medical Director of  Pulmonary Rehab. Continue with ITP unless changes are made by physician. Cas noted his BP dropped at home on Friday and he called his doctor.  He is currently on a BP med holiday to see how it does.  He does not feel dizzy today like Friday afternoon. 30 day  review completed. ITP sent to Dr.Jehanzeb Memon, Medical Director of  Pulmonary Rehab. Continue with ITP unless changes are made by physician. 30 day review completed. ITP sent to Dr.Jehanzeb Memon, Medical Director of  Pulmonary Rehab. Continue with ITP unless changes are made by physician.             Comments: 30 day review

## 2023-04-06 ENCOUNTER — Encounter (HOSPITAL_COMMUNITY): Payer: 59

## 2023-04-09 ENCOUNTER — Encounter (HOSPITAL_COMMUNITY): Payer: 59

## 2023-04-10 ENCOUNTER — Ambulatory Visit (INDEPENDENT_AMBULATORY_CARE_PROVIDER_SITE_OTHER): Payer: 59 | Admitting: Physician Assistant

## 2023-04-10 ENCOUNTER — Encounter: Payer: Self-pay | Admitting: Physician Assistant

## 2023-04-10 ENCOUNTER — Other Ambulatory Visit (INDEPENDENT_AMBULATORY_CARE_PROVIDER_SITE_OTHER): Payer: 59

## 2023-04-10 DIAGNOSIS — M545 Low back pain, unspecified: Secondary | ICD-10-CM

## 2023-04-10 NOTE — Progress Notes (Signed)
Office Visit Note   Patient: Chad Berry           Date of Birth: 1940/11/23           MRN: [id number] Visit Date: 04/10/2023              Requested by: Mosetta Putt, MD [address] [place],  [place] [address] PCP: Mosetta Putt, MD  Chief Complaint  Patient presents with   Right Hip - Pain   Left Hip - Pain      HPI: Patient is an 82 year old gentleman with a 6-day history of right lower back pain greater than left.  He denies any injury.  He denies any groin pain.  He denies any radicular symptoms or weakness.  Denies any fever or chills.  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  He cannot take anti-inflammatories because of his heart disease  Assessment & Plan: Visit Diagnoses:  1. Low back pain, unspecified back pain laterality, unspecified chronicity, unspecified whether sciatica present     Plan: Findings most consistent with low back pain without radicular findings.  He cannot take anti-inflammatories.  He is willing to try a course of anti-inflammatories.  We also talked about lidocaine patches and Voltaren gel which I think he could use.  Will follow-up in a month if he is not better could consider an MRI  Follow-Up Instructions: 1 month  Ortho Exam  Patient is alert, oriented, no adenopathy, well-dressed, normal affect, normal respiratory effort. Exam and nation of his low back he has paravertebral tenderness.  No step-offs no redness or erythema.  His strength is 5 out of 5 in his lower extremities including dorsiflexion plantarflexion extension and flexion of his legs.  Neurologically he is intact.  Imaging: No results found. No images are attached to the encounter.  Labs: Lab Results  Component Value Date   HGBA1C 6.0 (H) 09/20/2020     Lab Results  Component Value Date   ALBUMIN 3.8 11/08/2020   ALBUMIN 3.9 09/20/2020    Lab Results  Component Value Date   MG 1.9 10/23/2020   MG 2.1 10/22/2020   MG 1.8 09/29/2020   No results  found for: "VD25OH"  No results found for: "PREALBUMIN"    Latest Ref Rng & Units 11/10/2020    9:26 AM 11/08/2020    1:02 PM 10/24/2020   12:33 AM  CBC EXTENDED  WBC 4.0 - 10.5 K/uL 5.8  6.8  5.0   RBC 4.22 - 5.81 MIL/uL 4.69  4.54  3.70   Hemoglobin 13.0 - 17.0 g/dL 44.0  34.7  42.5   HCT 39.0 - 52.0 % 43.2  42.4  34.7   Platelets 150 - 400 K/uL 189  188  100   NEUT# 1.7 - 7.7 K/uL  5.0    Lymph# 0.7 - 4.0 K/uL  1.0       There is no height or weight on file to calculate BMI.  Orders:  Orders Placed This Encounter  Procedures   XR Lumbar Spine Complete   No orders of the defined types were placed in this encounter.    Procedures: No procedures performed  Clinical Data: No additional findings.  ROS:  All other systems negative, except as noted in the HPI. Review of Systems  Objective: Vital Signs: There were no vitals taken for this visit.  Specialty Comments:  No specialty comments available.  PMFS History: Patient Active Problem List   Diagnosis Date Noted  Acid reflux 11/28/2022   DOE (dyspnea on exertion) 01/30/2022   Paroxysmal atrial fibrillation (HCC) 11/16/2020   DVT (deep venous thrombosis) (HCC) 11/16/2020   Chest pain 10/22/2020   Long term (current) use of anticoagulants 09/30/2020   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 bil 39% 09/25/2013   Occlusion and stenosis of unspecified carotid artery 09/25/2013   Rapid palpitations    Mitral valve prolapse 12/23/2012   Essential hypertension 12/23/2012   Hyperlipidemia 12/23/2012   Past Medical History:  Diagnosis Date   Aortic atherosclerosis (HCC)     Bradycardia    while on BB with HR to the 40s   Carotid stenosis    CAROTID DOPPLER, 12/07/2011 - Mild arthrosclerotic changes, no high-grade stenosis   Cataract    rt. eye   Clostridium difficile infection 05/2019   Diverticulitis    Heart murmur    History of echocardiogram    Echo 9/16:  EF 60-65%, no RWMA, Gr 1 DD, holosystolic MVP of posterior leaflet, mild MR, LA upper limits of normal, normal RVF, mod TR, PASP 50 mmHg   Hypercholesteremia    Hyperlipidemia    Hypertension    Hypothyroidism    MVP (mitral valve prolapse)    a. Echo 12/07/2011 - EF-65-70%, severe mitral regurgitation, moderate-severe tricuspid regurgitation, moderate pulmonary HTN, moderate pulmonic regurgitation;  b. 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   Complex valvuloplasty including artificial Gore-tex neochords x10 with 28 mm Medtronic Simuform ring annuloplasty   Testicular hypofunction    Vitamin D deficiency     Family History  Problem Relation Age of Onset   Stroke Mother 22   Heart failure Father    Diverticulitis Sister    Stroke Brother 81   Heart attack Brother    Colon cancer Neg Hx    Esophageal cancer Neg Hx    Stomach cancer Neg Hx    Rectal cancer Neg Hx     Past Surgical History:  Procedure Laterality Date   COLONOSCOPY     ETHMOIDECTOMY Left 08/13/2020   Procedure: SPENOIDECTOMY AND ETHMOIDECTOMY;  Surgeon: Drema Halon, MD;  Location: [facility] OR;  Service: ENT;  Laterality: Left;   MITRAL VALVE REPAIR Right 09/22/2020   Procedure: MINIMALLY INVASIVE MITRAL VALVE REPAIR (MVR)  USING MEDTRONIC SIMUFORM SEMI-RIGID ANNULOPLASTY RING SIZE ON PUMP;  Surgeon: Purcell Nails, MD;  Location: [facility] OR;  Service: Open Heart Surgery;  Laterality: Right;   RIGHT/LEFT HEART CATH AND CORONARY ANGIOGRAPHY N/A 08/30/2020   Procedure: RIGHT/LEFT HEART CATH AND CORONARY ANGIOGRAPHY;  Surgeon: Runell Gess, MD;  Location: [facility] INVASIVE CV LAB;  Service: Cardiovascular;  Laterality: N/A;   SIGMOIDOSCOPY  02/14/2017   SINUS ENDO WITH FUSION Left 08/13/2020   Procedure: SINUS ENDO WITH FUSION;  Surgeon: [name]  E, MD;  Location: [facility] OR;  Service: ENT;  Laterality: Left;   TEE WITHOUT CARDIOVERSION N/A 05/04/2020   Procedure: TRANSESOPHAGEAL ECHOCARDIOGRAM (TEE);  Surgeon: Little Ishikawa, MD;  Location: [facility] ENDOSCOPY;  Service: Cardiovascular;  Laterality: N/A;   TEE WITHOUT CARDIOVERSION N/A 09/22/2020   Procedure: TRANSESOPHAGEAL ECHOCARDIOGRAM (TEE);  Surgeon: Purcell Nails, MD;  Location: [facility] OR;  Service: Open Heart Surgery;  Laterality: N/A;   Social History   Occupational History   Not on file  Tobacco Use   Smoking status: Former   Smokeless tobacco: Former    Types: Engineer, drilling   Vaping status: Never Used  Substance and Sexual Activity   Alcohol use: Yes    Alcohol/week: 0.0 standard drinks of alcohol    Comment: occasionally   Drug use: No   Sexual activity: Not on file

## 2023-04-11 ENCOUNTER — Encounter (HOSPITAL_COMMUNITY): Payer: 59

## 2023-04-13 ENCOUNTER — Encounter (HOSPITAL_COMMUNITY): Payer: 59

## 2023-04-16 ENCOUNTER — Encounter (HOSPITAL_COMMUNITY): Payer: 59

## 2023-04-17 ENCOUNTER — Encounter: Payer: Self-pay | Admitting: Physical Therapy

## 2023-04-17 ENCOUNTER — Other Ambulatory Visit: Payer: Self-pay

## 2023-04-17 ENCOUNTER — Ambulatory Visit: Payer: 59 | Attending: Family Medicine | Admitting: Physical Therapy

## 2023-04-17 DIAGNOSIS — M545 Low back pain, unspecified: Secondary | ICD-10-CM | POA: Insufficient documentation

## 2023-04-17 DIAGNOSIS — M62830 Muscle spasm of back: Secondary | ICD-10-CM | POA: Insufficient documentation

## 2023-04-17 DIAGNOSIS — M5459 Other low back pain: Secondary | ICD-10-CM | POA: Diagnosis present

## 2023-04-17 NOTE — Therapy (Signed)
OUTPATIENT PHYSICAL THERAPY THORACOLUMBAR EVALUATION   Patient Name: Chad Berry MRN: [id number] DOB:February 14, 1941, 82 y.o., male Today's Date: 04/17/2023  END OF SESSION:  PT End of Session - 04/17/23 1111     Visit Number 1    Number of Visits 12    Date for PT Re-Evaluation 07/16/23    PT Start Time 1021    PT Stop Time 1103    PT Time Calculation (min) 42 min    Activity Tolerance Patient tolerated treatment well    Behavior During Therapy WFL for tasks assessed/performed             Past Medical History:  Diagnosis Date   Aortic atherosclerosis (HCC)    Bradycardia    while on BB with HR to the 40s   Carotid stenosis    CAROTID DOPPLER, 12/07/2011 - Mild arthrosclerotic changes, no high-grade stenosis   Cataract    rt. eye   Clostridium difficile infection 05/2019   Diverticulitis    Heart murmur    History of echocardiogram    Echo 9/16:  EF 60-65%, no RWMA, Gr 1 DD, holosystolic MVP of posterior leaflet, mild MR, LA upper limits of normal, normal RVF, mod TR, PASP 50 mmHg   Hypercholesteremia    Hyperlipidemia    Hypertension    Hypothyroidism    MVP (mitral valve prolapse)    a. Echo 12/07/2011 - EF-65-70%, severe mitral regurgitation, moderate-severe tricuspid regurgitation, moderate pulmonary HTN, moderate pulmonic regurgitation;  b. 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   Complex valvuloplasty including artificial Gore-tex neochords x10 with 28 mm Medtronic Simuform ring annuloplasty   Testicular hypofunction    Vitamin D deficiency    Past Surgical  History:  Procedure Laterality Date   COLONOSCOPY     ETHMOIDECTOMY Left 08/13/2020   Procedure: SPENOIDECTOMY AND ETHMOIDECTOMY;  Surgeon: Drema Halon, MD;  Location: [facility] OR;  Service: ENT;  Laterality: Left;   MITRAL VALVE REPAIR Right 09/22/2020   Procedure: MINIMALLY INVASIVE MITRAL VALVE REPAIR (MVR) USING MEDTRONIC SIMUFORM SEMI-RIGID ANNULOPLASTY RING SIZE ON PUMP;  Surgeon: Purcell Nails, MD;  Location: [facility] OR;  Service: Open Heart Surgery;  Laterality: Right;   RIGHT/LEFT HEART CATH AND CORONARY ANGIOGRAPHY N/A 08/30/2020   Procedure: RIGHT/LEFT HEART CATH AND CORONARY ANGIOGRAPHY;  Surgeon: Runell Gess, MD;  Location: [facility] INVASIVE CV LAB;  Service: Cardiovascular;  Laterality: N/A;   SIGMOIDOSCOPY  02/14/2017   SINUS ENDO WITH FUSION Left 08/13/2020   Procedure: SINUS ENDO WITH FUSION;  Surgeon: Drema Halon, MD;  Location: [facility] OR;  Service: ENT;  Laterality: Left;   TEE WITHOUT CARDIOVERSION N/A 05/04/2020   Procedure: TRANSESOPHAGEAL ECHOCARDIOGRAM (TEE);  Surgeon: Little Ishikawa, MD;  Location: [facility] ENDOSCOPY;  Service: Cardiovascular;  Laterality: N/A;   TEE WITHOUT CARDIOVERSION N/A 09/22/2020   Procedure: TRANSESOPHAGEAL ECHOCARDIOGRAM (TEE);  Surgeon: Purcell Nails, MD;  Location: [facility] OR;  Service: Open Heart Surgery;  Laterality: N/A;   Patient Active Problem List   Diagnosis Date Noted   Acid reflux 11/28/2022   DOE (dyspnea on  exertion) 01/30/2022   Paroxysmal atrial fibrillation (HCC) 11/16/2020   DVT (deep venous thrombosis) (HCC) 11/16/2020   Chest pain 10/22/2020   Long term (current) use of anticoagulants 09/30/2020   S/P minimally invasive mitral valve repair 09/22/2020   History of diverticulitis 06/06/2019   Prolapsed internal hemorrhoids, grade 3 02/14/2017   Palpitation 06/09/2015   Mitral regurgitation 06/09/2015   Malignant hypertension 06/09/2015   Pain in both feet 08/03/2014   Lumbosacral spondylosis 03/05/2014    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 bil 39% 09/25/2013   Occlusion and stenosis of unspecified carotid artery 09/25/2013   Rapid palpitations    Mitral valve prolapse 12/23/2012   Essential hypertension 12/23/2012   Hyperlipidemia 12/23/2012   REFERRING PROVIDER: [place] Persons  REFERRING DIAG: Low back pain.  Rationale for Evaluation and Treatment: Rehabilitation  THERAPY DIAG:  Other low back pain  Muscle spasm of back  ONSET DATE: ~2 weeks.  SUBJECTIVE:                                                                                                                                                                                           SUBJECTIVE STATEMENT: The patient presents to the clinic with c/o right-sided low back pain for about two weeks.  He thinks it could be related to lifting several 50# bags of corn.  Once he is up and walking around his pain is low.  In fact, today it is a 2/10.  His pain can wake him up. We discussed sleeping with pillows between his knees.  He has used to pain patch which has helped.  Certain movements can increase his pain.     PERTINENT HISTORY:  Please see above.    PAIN:  Are you having pain? Yes: NPRS scale: 2/10 Pain location: Right LB region. Pain description: Ache. Aggravating factors: As above. Relieving factors: As above.    PRECAUTIONS: None  RED FLAGS: None   WEIGHT BEARING RESTRICTIONS: No  FALLS:  Has patient fallen in last 6 months? No  LIVING ENVIRONMENT: Lives with: lives with their spouse Lives in: House/apartment Has following equipment at home: None  OCCUPATION: Grows corn.  PLOF: Independent  PATIENT GOALS: Not have pain.  OBJECTIVE:  Note: Objective measures were completed at Evaluation unless otherwise noted.  DIAGNOSTIC FINDINGS:  04/10/23;  X-ray:  Patient x-rays today demonstrate overall no listhesis he does have  degenerative endplate and facet changes    PATIENT SURVEYS:  FOTO 58.8.   POSTURE: rounded shoulders, forward head, decreased lumbar lordosis, and flexed trunk   PALPATION: Tender to palpation over right SIJ/proximal gluteal region.  LUMBAR ROM:   Active lumbar flexion decreased by 50% and extension is 15 degrees.  LOWER EXTREMITY ROM:     WNL.  LOWER EXTREMITY MMT:    Normal LE strength.  LUMBAR SPECIAL TESTS:  Bilateral patellar reflexes are 2+/4+ and Achilles are 1+/4+.   (-) SLR and FABER testing.  Equal leg lengths.   GAIT: The patient walks in some trunk flexion.    TODAY'S TREATMENT:                                                                                                                              DATE: HMP and IFC at 80-150 Hz on 40% scan x 20 minutes to patient's right LB/SIJ/proximal gluteal musculature.   Normal modality response following removal of modality.   PATIENT EDUCATION:  Education details: Sleeping posture. Person educated: Patient Education method: Explanation Education comprehension: verbalized understanding  HOME EXERCISE PROGRAM:   ASSESSMENT:  CLINICAL IMPRESSION: The patient presents to OPPT with c/o left-sided low back pain that has been ongoing for about two weeks.  His pain is low today as he has been up and active which decreases his pain.  His sleep has been disturbed by pain.  Special testing is negative.  He demonstrates normal LE strength.  He is ender to palpation over right SIJ/proximal gluteal region.  His FOTO score is a 58.8.  Patient will benefit from skilled physical therapy intervention to address pain and deficits.  OBJECTIVE IMPAIRMENTS: Abnormal gait, decreased activity tolerance, decreased ROM, increased muscle spasms, postural dysfunction, and pain.   ACTIVITY LIMITATIONS: carrying, lifting, bending, sleeping, and locomotion level  PARTICIPATION LIMITATIONS: meal prep, cleaning, laundry, and yard work  [facility] POTENTIAL: Excellent  CLINICAL  DECISION MAKING: Stable/uncomplicated  EVALUATION COMPLEXITY: Low   GOALS:  LONG TERM GOALS: Target date: 07/16/23  Ind with a HEP.  Goal status: INITIAL  2.  Sleep undisturbed by pain.  Goal status: INITIAL  3.  Perform ADL's with pain not > 2/10.  Goal status: INITIAL  PLAN:  PT FREQUENCY: 2x/week  PT DURATION: 6 weeks  PLANNED INTERVENTIONS: 97110-Therapeutic exercises, 97530- Therapeutic activity, [id number]- Neuromuscular re-education, 97535- Self Care, [id number]- Manual therapy, 97014- Electrical stimulation (unattended), 97035- Ultrasound, Dry Needling, Cryotherapy, and Moist heat.  PLAN FOR NEXT SESSION: Combo e'stim/US, STW/M, Core exercise progression, spinal protection techniques and body mechanics training.    Abena Erdman, [place], PT 04/17/2023, 11:57 AM

## 2023-04-18 ENCOUNTER — Encounter (HOSPITAL_COMMUNITY): Payer: 59

## 2023-04-20 ENCOUNTER — Encounter (HOSPITAL_COMMUNITY): Payer: 59

## 2023-04-23 ENCOUNTER — Encounter (HOSPITAL_COMMUNITY): Payer: 59

## 2023-04-23 ENCOUNTER — Ambulatory Visit: Payer: 59

## 2023-04-23 DIAGNOSIS — M62830 Muscle spasm of back: Secondary | ICD-10-CM

## 2023-04-23 DIAGNOSIS — M5459 Other low back pain: Secondary | ICD-10-CM | POA: Diagnosis not present

## 2023-04-23 NOTE — Therapy (Signed)
OUTPATIENT PHYSICAL THERAPY THORACOLUMBAR TREATMENT   Patient Name: Chad Berry MRN: [id number] DOB:[birth year], 82 y.o., male Today's Date: 04/23/2023  END OF SESSION:  PT End of Session - 04/23/23 1349     Visit Number 2    Number of Visits 12    Date for PT Re-Evaluation 07/16/23    PT Start Time 1345    PT Stop Time 1415   Patient requested to leave early.   PT Time Calculation (min) 30 min    Activity Tolerance Patient tolerated treatment well    Behavior During Therapy WFL for tasks assessed/performed              Past Medical History:  Diagnosis Date   Aortic atherosclerosis (HCC)    Bradycardia    while on BB with HR to the 40s   Carotid stenosis    CAROTID DOPPLER, 12/07/2011 - Mild arthrosclerotic changes, no high-grade stenosis   Cataract    rt. eye   Clostridium difficile infection 05/2019   Diverticulitis    Heart murmur    History of echocardiogram    Echo 9/16:  EF 60-65%, no RWMA, Gr 1 DD, holosystolic MVP of posterior leaflet, mild MR, LA upper limits of normal, normal RVF, mod TR, PASP 50 mmHg   Hypercholesteremia    Hyperlipidemia    Hypertension    Hypothyroidism    MVP (mitral valve prolapse)    a. Echo 12/07/2011 - EF-65-70%, severe mitral regurgitation, moderate-severe tricuspid regurgitation, moderate pulmonary HTN, moderate pulmonic regurgitation;  b. 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   Complex valvuloplasty including artificial Gore-tex neochords x10 with 28 mm Medtronic Simuform ring annuloplasty   Testicular hypofunction    Vitamin  D deficiency    Past Surgical History:  Procedure Laterality Date   COLONOSCOPY     ETHMOIDECTOMY Left 08/13/2020   Procedure: SPENOIDECTOMY AND ETHMOIDECTOMY;  Surgeon: Drema Halon, MD;  Location: [facility] OR;  Service: ENT;  Laterality: Left;   MITRAL VALVE REPAIR Right 09/22/2020   Procedure: MINIMALLY INVASIVE MITRAL VALVE REPAIR (MVR) USING MEDTRONIC SIMUFORM SEMI-RIGID ANNULOPLASTY RING SIZE ON PUMP;  Surgeon: Purcell Nails, MD;  Location: [facility] OR;  Service: Open Heart Surgery;  Laterality: Right;   RIGHT/LEFT HEART CATH AND CORONARY ANGIOGRAPHY N/A 08/30/2020   Procedure: RIGHT/LEFT HEART CATH AND CORONARY ANGIOGRAPHY;  Surgeon: Runell Gess, MD;  Location: [facility] INVASIVE CV LAB;  Service: Cardiovascular;  Laterality: N/A;   SIGMOIDOSCOPY  02/14/2017   SINUS ENDO WITH FUSION Left 08/13/2020   Procedure: SINUS ENDO WITH FUSION;  Surgeon: Drema Halon, MD;  Location: [facility] OR;  Service: ENT;  Laterality: Left;   TEE WITHOUT CARDIOVERSION N/A 05/04/2020   Procedure: TRANSESOPHAGEAL ECHOCARDIOGRAM (TEE);  Surgeon: Little Ishikawa, MD;  Location: [facility] ENDOSCOPY;  Service: Cardiovascular;  Laterality: N/A;   TEE WITHOUT CARDIOVERSION N/A 09/22/2020   Procedure: TRANSESOPHAGEAL ECHOCARDIOGRAM (TEE);  Surgeon: Purcell Nails, MD;  Location: [facility] OR;  Service: Open Heart Surgery;  Laterality: N/A;   Patient Active Problem List   Diagnosis Date Noted   Acid  reflux 11/28/2022   DOE (dyspnea on exertion) 01/30/2022   Paroxysmal atrial fibrillation (HCC) 11/16/2020   DVT (deep venous thrombosis) (HCC) 11/16/2020   Chest pain 10/22/2020   Long term (current) use of anticoagulants 09/30/2020   S/P minimally invasive mitral valve repair 09/22/2020   History of diverticulitis 06/06/2019   Prolapsed internal hemorrhoids, grade 3 02/14/2017   Palpitation 06/09/2015   Mitral regurgitation 06/09/2015   Malignant hypertension 06/09/2015   Pain in both feet 08/03/2014   Lumbosacral  spondylosis 03/05/2014   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 bil 39% 09/25/2013   Occlusion and stenosis of unspecified carotid artery 09/25/2013   Rapid palpitations    Mitral valve prolapse 12/23/2012   Essential hypertension 12/23/2012   Hyperlipidemia 12/23/2012   REFERRING PROVIDER: [place] Persons  REFERRING DIAG: Low back pain.  Rationale for Evaluation and Treatment: Rehabilitation  THERAPY DIAG:  Other low back pain  Muscle spasm of back  ONSET DATE: ~2 weeks.  SUBJECTIVE:                                                                                                                                                                                           SUBJECTIVE STATEMENT: Patient reports that he did well after his last appointment as he felt good for a few days afterward. However, his pain flared up last night after lifting a 30 pound bag. He feels that he is coming down with something as he does not feel great.   PERTINENT HISTORY:  Please see above.    PAIN:  Are you having pain? Yes: NPRS scale: 4/10 Pain location: Right LB region. Pain description: Ache. Aggravating factors: As above. Relieving factors: As above.    PRECAUTIONS: None  RED FLAGS: None   WEIGHT BEARING RESTRICTIONS: No  FALLS:  Has patient fallen in last 6 months? No  LIVING ENVIRONMENT: Lives with: lives with their spouse Lives in: House/apartment Has following equipment at home: None  OCCUPATION: Grows corn.  PLOF: Independent  PATIENT GOALS: Not have pain.  OBJECTIVE:  Note: Objective measures were completed at Evaluation unless otherwise noted.  DIAGNOSTIC FINDINGS:  04/10/23;  X-ray:  Patient x-rays today demonstrate overall no listhesis he does have  degenerative endplate and facet changes   PATIENT SURVEYS:  FOTO 58.8.   POSTURE: rounded shoulders, forward head, decreased lumbar lordosis, and flexed trunk    PALPATION: Tender to palpation over right SIJ/proximal gluteal region.  LUMBAR ROM:   Active lumbar flexion decreased by 50% and extension is 15 degrees.  LOWER EXTREMITY ROM:     WNL.  LOWER EXTREMITY MMT:  Normal LE strength.  LUMBAR SPECIAL TESTS:  Bilateral patellar reflexes are 2+/4+ and Achilles are 1+/4+.   (-) SLR and FABER testing.  Equal leg lengths.   GAIT: The patient walks in some trunk flexion.    TODAY'S TREATMENT:                                                                                                                              DATE:                                    04/23/23 EXERCISE LOG  Exercise Repetitions and Resistance Comments  Nustep  L4 x 8 minutes    Blank cell = exercise not performed today  Modalities  Date:  Unattended Estim: Lumbar, IFC @ 80-150 Hz w/ 40% scan, 15 mins, Pain Hot Pack: Lumbar, 15 mins, Pain  PATIENT EDUCATION:  Education details: Sleeping posture. Person educated: Patient Education method: Explanation Education comprehension: verbalized understanding  HOME EXERCISE PROGRAM:   ASSESSMENT:  CLINICAL IMPRESSION: Patient was introduced to the NuStep for improved lumbar and lower extremity muscular endurance. He requested to avoid any other active interventions today due to not feeling well. Electrical stimulation was then utilized to reduce his familiar symptoms with good results. He reported feeling better upon the conclusion of treatment. He continues to require skilled physical therapy to address his remaining impairments to return to his prior level of function.   OBJECTIVE IMPAIRMENTS: Abnormal gait, decreased activity tolerance, decreased ROM, increased muscle spasms, postural dysfunction, and pain.   ACTIVITY LIMITATIONS: carrying, lifting, bending, sleeping, and locomotion level  PARTICIPATION LIMITATIONS: meal prep, cleaning, laundry, and yard work  [facility] POTENTIAL: Excellent  CLINICAL  DECISION MAKING: Stable/uncomplicated  EVALUATION COMPLEXITY: Low   GOALS:  LONG TERM GOALS: Target date: 07/16/23  Ind with a HEP.  Goal status: INITIAL  2.  Sleep undisturbed by pain.  Goal status: INITIAL  3.  Perform ADL's with pain not > 2/10.  Goal status: INITIAL  PLAN:  PT FREQUENCY: 2x/week  PT DURATION: 6 weeks  PLANNED INTERVENTIONS: 97110-Therapeutic exercises, 97530- Therapeutic activity, [id number]- Neuromuscular re-education, 97535- Self Care, [id number]- Manual therapy, 97014- Electrical stimulation (unattended), 97035- Ultrasound, Dry Needling, Cryotherapy, and Moist heat.  PLAN FOR NEXT SESSION: Combo e'stim/US, STW/M, Core exercise progression, spinal protection techniques and body mechanics training.    Granville Lewis, PT 04/23/2023, 2:23 PM

## 2023-04-27 ENCOUNTER — Encounter (HOSPITAL_COMMUNITY): Payer: 59

## 2023-04-30 ENCOUNTER — Encounter (HOSPITAL_COMMUNITY): Payer: 59

## 2023-05-01 ENCOUNTER — Encounter (HOSPITAL_COMMUNITY): Payer: Self-pay | Admitting: *Deleted

## 2023-05-01 DIAGNOSIS — R0600 Dyspnea, unspecified: Secondary | ICD-10-CM

## 2023-05-01 NOTE — Progress Notes (Signed)
 Pulmonary Individual Treatment Plan  Patient Details  Name: Chad Berry MRN: [id number] Date of Birth: [birth year] Referring Provider:   Flowsheet Row PULMONARY REHAB OTHER RESP ORIENTATION from 01/10/2023 in [facility] CARDIAC REHABILITATION  Referring Provider Dr. Jude       Initial Encounter Date:  Flowsheet Row PULMONARY REHAB OTHER RESP ORIENTATION from 01/10/2023 in [facility] CARDIAC REHABILITATION  Date 01/10/23       Visit Diagnosis: Dyspnea, unspecified type  Patient's Home Medications on Admission:   Current Outpatient Medications:    ALPRAZolam (XANAX) 0.5 MG tablet, Take 0.5 mg by mouth at bedtime as needed. (Patient not taking: Reported on 01/05/2023), Disp: , Rfl:    amoxicillin  (AMOXIL ) 500 MG capsule, Take 4 capsules (2,000mg ) by mouth 30-60 minutes prior to dental work or cleaning., Disp: 4 capsule, Rfl: 3   Ascorbic Acid (VITAMIN C) 1000 MG tablet, Take 1,000 mg by mouth daily. (Patient not taking: Reported on 01/05/2023), Disp: , Rfl:    Cholecalciferol (VITAMIN D) 50 MCG (2000 UT) tablet, Take 2,000 Units by mouth daily., Disp: , Rfl:    Evolocumab  (REPATHA  SURECLICK) 140 MG/ML SOAJ, Inject 140 mg into the skin every 14 (fourteen) days., Disp: 6 mL, Rfl: 3   ezetimibe  (ZETIA ) 10 MG tablet, Take 1 tablet (10 mg total) by mouth daily., Disp: 90 tablet, Rfl: 3   famotidine  (PEPCID ) 40 MG tablet, Take 1 tablet (40 mg total) by mouth at bedtime., Disp: 30 tablet, Rfl: 1   levothyroxine  (SYNTHROID ) 75 MCG tablet, Take 75 mcg by mouth at bedtime., Disp: , Rfl:    losartan  (COZAAR ) 50 MG tablet, Take 50 mg by mouth daily., Disp: , Rfl:    tamsulosin (FLOMAX) 0.4 MG CAPS capsule, Take 0.4 mg by mouth at bedtime., Disp: , Rfl:    vitamin B-12 (CYANOCOBALAMIN) 100 MCG tablet, Take 100 mcg by mouth daily., Disp: , Rfl:   Past Medical History: Past Medical History:  Diagnosis Date   Aortic atherosclerosis (HCC)    Bradycardia    while on BB with HR to the 40s   Carotid  stenosis    CAROTID DOPPLER, 12/07/2011 - Mild arthrosclerotic changes, no high-grade stenosis   Cataract    rt. eye   Clostridium difficile infection 05/2019   Diverticulitis    Heart murmur    History of echocardiogram    Echo 9/16:  EF 60-65%, no RWMA, Gr 1 DD, holosystolic MVP of posterior leaflet, mild MR, LA upper limits of normal, normal RVF, mod TR, PASP 50 mmHg   Hypercholesteremia    Hyperlipidemia    Hypertension    Hypothyroidism    MVP (mitral valve prolapse)    a. Echo 12/07/2011 - EF-65-70%, severe mitral regurgitation, moderate-severe tricuspid regurgitation, moderate pulmonary HTN, moderate pulmonic regurgitation;  b. 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3  47.5  43.6  45.5   Bicarbonate 20.0 - 28.0 mmol/L 24.7  25.7  25.5  23.6  19.1  21.1  22.8  24.0  26.9  24.8  25.3  24.7   TCO2 22 - 32 mmol/L 26  27  27   - 20  23  24  22  25  24  28  26  26  27  23  26  26     Acid-base deficit 0.0 - 2.0 mmol/L 1.0  1.0  0.8  5.0  6.0  3.0  1.0  1.0   O2 Saturation % 95.0  77.0  76.0  97.9  99.0  94.0  99.0  100.0  100.0  87.0  100.0  100.0     Details       Multiple values from one day are sorted in reverse-chronological order         Capillary Blood Glucose: Lab Results  Component Value Date   GLUCAP 157 (H) 09/28/2020   GLUCAP 127 (H) 09/24/2020   GLUCAP 124 (H) 09/24/2020   GLUCAP 114 (H) 09/24/2020   GLUCAP 125 (H) 09/24/2020     Pulmonary Assessment Scores:  Pulmonary Assessment Scores     Row Name 01/10/23 0910         ADL UCSD   ADL Phase Entry     SOB Score total 7     Rest 0     Walk 0     Stairs 4     Bath 0     Dress 0     Shop 0       CAT Score   CAT Score 4       mMRC Score   mMRC Score 1             UCSD: Self-administered rating of dyspnea associated with activities of daily living (ADLs) 6-point scale (0 = not at all to 5 = maximal or unable to do because of breathlessness)  Scoring Scores range from 0 to 120.  Minimally important difference is 5 units  CAT: CAT can identify the health impairment of COPD patients and is better correlated with disease progression.  CAT has a scoring range of zero to 40. The CAT score is classified into four groups of low (less than 10), medium (10 - 20), high (21-30) and very high (31-40) based on the impact level of disease on health status. A CAT score over 10 suggests significant symptoms.  A worsening CAT score could be explained by an exacerbation, poor medication adherence, poor inhaler technique, or progression of COPD or comorbid conditions.  CAT MCID is 2 points  mMRC: mMRC (Modified Medical Research Council) Dyspnea Scale is used to assess the degree of baseline functional disability in patients of respiratory disease due to dyspnea. No minimal important difference is established. A decrease in score of 1 point or greater is considered a positive change.    Pulmonary Function Assessment:   Exercise Target Goals: Exercise  Program Goal: Individual exercise prescription set using results from initial 6 min walk test and THRR while considering  patient's activity barriers and safety.   Exercise Prescription Goal: Initial exercise prescription builds to 30-45 minutes a day of aerobic activity, 2-3 days per week.  Home exercise guidelines will be given to patient during program as part of exercise prescription that the participant will acknowledge.  Activity Barriers & Risk Stratification:  Activity Barriers & Cardiac Risk Stratification - 01/05/23 1318       Activity Barriers & Cardiac Risk Stratification   Activity Barriers Shortness of Breath    Cardiac Risk Stratification Moderate             6 Minute Walk:  6 Minute Walk     Row Name 01/10/23 1001         6 Minute Walk   Phase Initial     Distance 1455 feet     Walk Time 6 minutes     # of Rest Breaks 0     MPH 2.75     METS 5.25     RPE 9     Perceived Dyspnea  0     VO2 Peak 18.38     Symptoms No     Resting HR 65 bpm     Resting BP 140/80     Resting Oxygen  Saturation  96 %     Exercise Oxygen  Saturation  during 6 min walk 95 %     Max Ex. HR 94 bpm     Max Ex. BP 146/80     2 Minute Post BP 140/78       Interval HR   1 Minute HR 84     2 Minute HR 87     3 Minute HR 86     4 Minute HR 90     5 Minute HR 92     6 Minute HR 94     2 Minute Post HR 76     Interval Heart Rate? Yes       Interval Oxygen    Interval Oxygen ? Yes     Baseline Oxygen  Saturation % 96 %     1 Minute Oxygen  Saturation % 98 %     1 Minute Liters of Oxygen  0 L     2 Minute Oxygen  Saturation % 95 %     2 Minute Liters of Oxygen  0 L     3 Minute Oxygen  Saturation % 96 %     3 Minute Liters of Oxygen  0 L     4 Minute Oxygen  Saturation % 96 %     4 Minute Liters of Oxygen  0 L     5 Minute Oxygen  Saturation % 96 %     5 Minute Liters of Oxygen  0 L     6 Minute Oxygen   Saturation % 97 %     6 Minute Liters of Oxygen  0 L     2 Minute Post Oxygen  Saturation % 97 %     2 Minute Post Liters of Oxygen  0 L              Oxygen  Initial Assessment:  Oxygen  Initial Assessment - 01/10/23 0911       Home Oxygen    Home Oxygen  Device None    Sleep Oxygen  Prescription None    Home Exercise Oxygen  Prescription None    Home Resting Oxygen  Prescription None      Initial 6 min Walk  Oxygen  Used None      Program Oxygen  Prescription   Program Oxygen  Prescription None      Intervention   Short Term Goals To learn and exhibit compliance with exercise, home and travel O2 prescription;To learn and understand importance of monitoring SPO2 with pulse oximeter and demonstrate accurate use of the pulse oximeter.;To learn and understand importance of maintaining oxygen  saturations>88%;To learn and demonstrate proper pursed lip breathing techniques or other breathing techniques. ;To learn and demonstrate proper use of respiratory medications    Long  Term Goals Exhibits compliance with exercise, home  and travel O2 prescription;Verbalizes importance of monitoring SPO2 with pulse oximeter and return demonstration;Maintenance of O2 saturations>88%;Exhibits proper breathing techniques, such as pursed lip breathing or other method taught during program session;Compliance with respiratory medication;Demonstrates proper use of MDI's             Oxygen  Re-Evaluation:  Oxygen  Re-Evaluation     Row Name 01/29/23 1044 02/14/23 1014 03/16/23 0940         Program Oxygen  Prescription   Program Oxygen  Prescription None None None       Home Oxygen    Home Oxygen  Device None None None     Sleep Oxygen  Prescription None None None     Home Exercise Oxygen  Prescription None None None     Home Resting Oxygen  Prescription None None None       Goals/Expected Outcomes   Short Term Goals To learn and understand importance of monitoring SPO2 with pulse oximeter and demonstrate  accurate use of the pulse oximeter.;To learn and understand importance of maintaining oxygen  saturations>88%;To learn and demonstrate proper pursed lip breathing techniques or other breathing techniques.  To learn and understand importance of monitoring SPO2 with pulse oximeter and demonstrate accurate use of the pulse oximeter.;To learn and understand importance of maintaining oxygen  saturations>88%;To learn and demonstrate proper pursed lip breathing techniques or other breathing techniques.  To learn and understand importance of monitoring SPO2 with pulse oximeter and demonstrate accurate use of the pulse oximeter.;To learn and understand importance of maintaining oxygen  saturations>88%;To learn and demonstrate proper pursed lip breathing techniques or other breathing techniques.      Long  Term Goals Verbalizes importance of monitoring SPO2 with pulse oximeter and return demonstration;Maintenance of O2 saturations>88%;Exhibits proper breathing techniques, such as pursed lip breathing or other method taught during program session Verbalizes importance of monitoring SPO2 with pulse oximeter and return demonstration;Maintenance of O2 saturations>88%;Exhibits proper breathing techniques, such as pursed lip breathing or other method taught during program session Verbalizes importance of monitoring SPO2 with pulse oximeter and return demonstration;Maintenance of O2 saturations>88%;Exhibits proper breathing techniques, such as pursed lip breathing or other method taught during program session     Comments Eutimio is doing well in rehab. He is breathing better walking, but still getting SOB with bending, carrying, and walking up stairs.  We talked about making sure to use his PLB for all three and to also make sure that he is not holding his breath when he does these. He has been working on his PLB and will continue to do so.  He is going to try not to hold his breath when he is carrying things to see if it improves.  Deavin is doing well in rehab.  He is doing well with his PLB and his SOB has improved.  They only time he is SOB is while carrying things or going upstairs.  His saturations are doing well. Jacquees is doing well in rehab.  He is working on PLB. He has noticed a slight increase in his breathing when taking in groceries the other day, he did not need to stop to take breaks when walking up his stairs.     Goals/Expected Outcomes Short: Make sure to not hold breath while carrying items Long: Continue to improve SOB and use PLB Short: Continue to use PLB even while carrying things Long: Continue to improve SOB Short: Continue to use PLB even while carrying things Long: Continue to improve SOB              Oxygen  Discharge (Final Oxygen  Re-Evaluation):  Oxygen  Re-Evaluation - 03/16/23 0940       Program Oxygen  Prescription   Program Oxygen  Prescription None      Home Oxygen    Home Oxygen  Device None    Sleep Oxygen  Prescription None    Home Exercise Oxygen  Prescription None    Home Resting Oxygen  Prescription None      Goals/Expected Outcomes   Short Term Goals To learn and understand importance of monitoring SPO2 with pulse oximeter and demonstrate accurate use of the pulse oximeter.;To learn and understand importance of maintaining oxygen  saturations>88%;To learn and demonstrate proper pursed lip breathing techniques or other breathing techniques.     Long  Term Goals Verbalizes importance of monitoring SPO2 with pulse oximeter and return demonstration;Maintenance of O2 saturations>88%;Exhibits proper breathing techniques, such as pursed lip breathing or other method taught during program session    Comments Calab is doing well in rehab. He is working on PLB. He has noticed a slight increase in his breathing when taking in groceries the other day, he did not need to stop to take breaks when walking up his stairs.    Goals/Expected Outcomes Short: Continue to use PLB even while carrying things Long:  Continue to improve SOB             Initial Exercise Prescription:  Initial Exercise Prescription - 01/10/23 1000       Date of Initial Exercise RX and Referring Provider   Date 01/10/23    Referring Provider Dr. Jude      Oxygen    Maintain Oxygen  Saturation 88% or higher      Treadmill   MPH 2.5    Grade 0.5    Minutes 15      REL-XR   Level 3    Speed 60    Minutes 15      Prescription Details   Frequency (times per week) 3    Duration Progress to 30 minutes of continuous aerobic without signs/symptoms of physical distress      Intensity   THRR 40-80% of Max Heartrate 95-125    Ratings of Perceived Exertion 11-13    Perceived Dyspnea 0-4      Resistance Training   Training Prescription Yes    Weight 4    Reps 10-15             Perform Capillary Blood Glucose checks as needed.  Exercise Prescription Changes:   Exercise Prescription Changes     Row Name 01/22/23 0907 01/29/23 1000 03/12/23 0800 03/19/23 1500 04/02/23 1200     Response to Exercise   Blood Pressure (Admit) 110/60 -- 124/62 120/64 120/60   Blood Pressure (Exercise) 142/62 -- -- -- --   Blood Pressure (Exit) 116/78 -- 120/60 104/66 120/64   Heart Rate (Admit) 70 bpm -- 66 bpm 70 bpm 74 bpm   Heart Rate (Exercise) 97 bpm -- 97  bpm 90 bpm 88 bpm   Heart Rate (Exit) 75 bpm -- 73 bpm 78 bpm 82 bpm   Oxygen  Saturation (Admit) 95 % -- 96 % 96 % 95 %   Oxygen  Saturation (Exercise) 97 % -- 97 % 97 % 96 %   Oxygen  Saturation (Exit) 96 % -- 94 % 96 % 96 %   Rating of Perceived Exertion (Exercise) 12 -- 13 12 12    Perceived Dyspnea (Exercise) 0 -- 0 1 0   Duration Continue with 30 min of aerobic exercise without signs/symptoms of physical distress. -- Continue with 30 min of aerobic exercise without signs/symptoms of physical distress. Continue with 30 min of aerobic exercise without signs/symptoms of physical distress. Continue with 30 min of aerobic exercise without signs/symptoms of physical  distress.   Intensity THRR unchanged -- THRR unchanged THRR unchanged THRR unchanged     Progression   Progression Continue to progress workloads to maintain intensity without signs/symptoms of physical distress. -- Continue to progress workloads to maintain intensity without signs/symptoms of physical distress. Continue to progress workloads to maintain intensity without signs/symptoms of physical distress. Continue to progress workloads to maintain intensity without signs/symptoms of physical distress.     Resistance Training   Training Prescription Yes -- Yes Yes Yes   Weight 4 lbs -- 8 lbs 8lbs 8lbs   Reps 10-15 -- 10-15 10-15 10-15     Treadmill   MPH 3.1 -- 3.3 2.7 2.6   Grade 2.5 -- 2 2 2    Minutes 15 -- 15 15 15    METs 4.44 -- 4.43 3.81 3.71     Bike   Level 6 -- 6 6 --   Watts 62 -- 81 78 --   Minutes 15 -- 15 15 --   METs 3.3 -- 3.4 3.1 --     Home Exercise Plan   Plans to continue exercise at -- Home (comment)  walking Home (comment) Home (comment) --   Frequency -- Add 3 additional days to program exercise sessions. Add 3 additional days to program exercise sessions. Add 3 additional days to program exercise sessions. --   Initial Home Exercises Provided -- 01/29/23 -- -- --     Oxygen    Maintain Oxygen  Saturation 88% or higher -- 88% or higher 88% or higher --            Exercise Comments:   Exercise Comments     Row Name 01/10/23 1005           Exercise Comments Jamaris completed his walk test today for starting rehab. He went 1455 ft and did great. He will be exercising on treadmill and elliptical.                Exercise Goals and Review:   Exercise Goals     Row Name 01/10/23 1005             Exercise Goals   Increase Physical Activity Yes       Intervention Provide advice, education, support and counseling about physical activity/exercise needs.;Develop an individualized exercise prescription for aerobic and resistive training based on  initial evaluation findings, risk stratification, comorbidities and participant's personal goals.       Expected Outcomes Short Term: Attend rehab on a regular basis to increase amount of physical activity.;Long Term: Exercising regularly at least 3-5 days a week.;Long Term: Add in home exercise to make exercise part of routine and to increase amount of physical activity.  Increase Strength and Stamina Yes       Intervention Provide advice, education, support and counseling about physical activity/exercise needs.;Develop an individualized exercise prescription for aerobic and resistive training based on initial evaluation findings, risk stratification, comorbidities and participant's personal goals.       Expected Outcomes Short Term: Perform resistance training exercises routinely during rehab and add in resistance training at home;Short Term: Increase workloads from initial exercise prescription for resistance, speed, and METs.;Long Term: Improve cardiorespiratory fitness, muscular endurance and strength as measured by increased METs and functional capacity ( )       Able to understand and use rate of perceived exertion (RPE) scale Yes       Intervention Provide education and explanation on how to use RPE scale       Expected Outcomes Short Term: Able to use RPE daily in rehab to express subjective intensity level;Long Term:  Able to use RPE to guide intensity level when exercising independently       Able to understand and use Dyspnea scale Yes       Intervention Provide education and explanation on how to use Dyspnea scale       Expected Outcomes Short Term: Able to use Dyspnea scale daily in rehab to express subjective sense of shortness of breath during exertion;Long Term: Able to use Dyspnea scale to guide intensity level when exercising independently       Knowledge and understanding of Target Heart Rate Range (THRR) Yes       Intervention Provide education and explanation of THRR  including how the numbers were predicted and where they are located for reference       Expected Outcomes Long Term: Able to use THRR to govern intensity when exercising independently;Short Term: Able to state/look up THRR;Short Term: Able to use daily as guideline for intensity in rehab       Able to check pulse independently Yes       Intervention Provide education and demonstration on how to check pulse in carotid and radial arteries.;Review the importance of being able to check your own pulse for safety during independent exercise       Expected Outcomes Short Term: Able to explain why pulse checking is important during independent exercise;Long Term: Able to check pulse independently and accurately       Understanding of Exercise Prescription Yes       Intervention Provide education, explanation, and written materials on patient's individual exercise prescription       Expected Outcomes Short Term: Able to explain program exercise prescription;Long Term: Able to explain home exercise prescription to exercise independently                Exercise Goals Re-Evaluation :  Exercise Goals Re-Evaluation     Row Name 01/23/23 0909 01/29/23 0957 02/14/23 1004 03/14/23 0802 03/16/23 0919     Exercise Goal Re-Evaluation   Exercise Goals Review Increase Physical Activity;Increase Strength and Stamina;Understanding of Exercise Prescription Increase Physical Activity;Increase Strength and Stamina;Able to understand and use rate of perceived exertion (RPE) scale;Able to understand and use Dyspnea scale;Able to check pulse independently;Knowledge and understanding of Target Heart Rate Range (THRR);Understanding of Exercise Prescription Increase Physical Activity;Increase Strength and Stamina;Understanding of Exercise Prescription Increase Physical Activity;Able to understand and use rate of perceived exertion (RPE) scale;Understanding of Exercise Prescription Increase Physical Activity;Increase Strength  and Stamina;Understanding of Exercise Prescription   Comments Johnathin has been doing great in rehab. He has been increasing his workloads each week.  He is currently exercising at level 6 on the bike and walking at 3.1 speed with a 2.5 grade on the treadmill. Will continue to monitor and progress as able. [place] and walks with his dogs daily.  Reviewed home exercise with pt today.  Pt plans to walk with dogs at home for exercise.  Reviewed THR, pulse, RPE, sign and symptoms, pulse oximetery and when to call 911 or MD.  Also discussed weather considerations and indoor options.  Pt voiced understanding. Madhav is doing well in rehab. He is walking on his off days.  He feels like his stamina is improving. He still has a hard time walking upstairs and carrying a load.  We will continue to monitor his progress. Thaddaeus is doing well with exercise. [place] his [place] on his days off. He is exercise at a MET on 4.43 on the treadmill. He is walking at a speed of 3.3 and an incline of 2.0. Will continue to monitor and progress as able. Zamarian is doing well in rehab. [place] his [place]. He is still having trouble with doing stairs and inclines but has noticed a slight improvment when taking in his groceries the other day. He is increasing workloads on both the bike and treadmill.   Expected Outcomes Short term: continue to exercise at current workloads    long term: continue to attend rehab Short: Continue to add in walking at home Long: Conitnue to improve breathing. Short: Trying walking while carrying weights to load Long; Conitnue to improve stamina Short: increase grade or walk with weights long term: continue to attend rehab Short: increase grade or walk with weights long term: continue to attend rehab    Row Name 03/19/23 1556             Exercise Goal Re-Evaluation   Exercise Goals Review Increase Physical Activity;Increase Strength and Stamina;Understanding of  Exercise Prescription       Comments Derrall is tolerating exericse well. He is increasing his speed and grade on the treadmill each class. He is exericsing at 3.81 METs on the treadmill. Will continue to monitor and progress as aable.       Expected Outcomes Short: increase grade or walk with weights long term: continue to attend rehab                Discharge Exercise Prescription (Final Exercise Prescription Changes):  Exercise Prescription Changes - 04/02/23 1200       Response to Exercise   Blood Pressure (Admit) 120/60    Blood Pressure (Exit) 120/64    Heart Rate (Admit) 74 bpm    Heart Rate (Exercise) 88 bpm    Heart Rate (Exit) 82 bpm    Oxygen  Saturation (Admit) 95 %    Oxygen  Saturation (Exercise) 96 %    Oxygen  Saturation (Exit) 96 %    Rating of Perceived Exertion (Exercise) 12    Perceived Dyspnea (Exercise) 0    Duration Continue with 30 min of aerobic exercise without signs/symptoms of physical distress.    Intensity THRR unchanged      Progression   Progression Continue to progress workloads to maintain intensity without signs/symptoms of physical distress.      Resistance Training   Training Prescription Yes    Weight 8lbs    Reps 10-15      Treadmill   MPH 2.6    Grade 2    Minutes 15  METs 3.71             Nutrition:  Target Goals: Understanding of nutrition guidelines, daily intake of sodium 1500mg , cholesterol 200mg , calories 30% from fat and 7% or less from saturated fats, daily to have 5 or more servings of fruits and vegetables.  Biometrics:  Pre Biometrics - 01/10/23 1007       Pre Biometrics   Height 5' 10 (1.778 m)    Weight 229 lb 0.9 oz (103.9 kg)    Waist Circumference 45 inches    Hip Circumference 42 inches    Waist to Hip Ratio 1.07 %    BMI (Calculated) 32.87    Grip Strength 29.4 kg    Single Leg Stand 60 seconds              Nutrition Therapy Plan and Nutrition Goals:   Nutrition  Assessments:  MEDIFICTS Score Key: >=70 Need to make dietary changes  40-70 Heart Healthy Diet <= 40 Therapeutic Level Cholesterol Diet  Flowsheet Row PULMONARY REHAB OTHER RESP ORIENTATION from 01/10/2023 in [facility] CARDIAC REHABILITATION  Picture Your Plate Total Score on Admission 36      Picture Your Plate Scores: <59 Unhealthy dietary pattern with much room for improvement. 41-50 Dietary pattern unlikely to meet recommendations for good health and room for improvement. 51-60 More healthful dietary pattern, with some room for improvement.  >60 Healthy dietary pattern, although there may be some specific behaviors that could be improved.    Nutrition Goals Re-Evaluation:  Nutrition Goals Re-Evaluation     Row Name 01/29/23 1043 02/14/23 1010 03/16/23 0931         Goals   Nutrition Goal Healthy Eating Plan Short: Continue to add in more fruit and vegetables Long: Continue to focus on healthy eatin g Healthy eating     Comment Raijon is doing well in rehab.  He is not follow any particular diet, but he is trying to eat healthier. He is getting a lot of fruits and vegetables.  He has also started to cut back on his sweets.  He is getting enough protein and works on portion control. Rowe is doing well in rehab.  He has been working on his diet.  He really is working on portion control and frustrated that he is not losing weight.  He does eat fruits and vegetables.  He continues to try to limit his sweets. Laverne is doing well with his eating. He stated that he is trying to eat more veggies and is working on portion control. He was wanting to lose weight and has been working on eating healthier. He has been limiting his sweets.     Expected Outcome Short: Continue to add in more fruit and vegetables Long: Continue to focus on healthy eatin g Short: Conitnue to work on portion size and stick to diet through plateau Long: COnitnue to eat healthy Short: continue to work on portion control. Long  term: continue to eat healthy              Nutrition Goals Discharge (Final Nutrition Goals Re-Evaluation):  Nutrition Goals Re-Evaluation - 03/16/23 0931       Goals   Nutrition Goal Healthy eating    Comment Connor is doing well with his eating. He stated that he is trying to eat more veggies and is working on portion control. He was wanting to lose weight and has been working on eating healthier. He has been limiting his sweets.  Expected Outcome Short: continue to work on portion control. Long term: continue to eat healthy             Psychosocial: Target Goals: Acknowledge presence or absence of significant depression and/or stress, maximize coping skills, provide positive support system. Participant is able to verbalize types and ability to use techniques and skills needed for reducing stress and depression.  Initial Review & Psychosocial Screening:  Initial Psych Review & Screening - 01/05/23 1345       Initial Review   Current issues with None Identified      Family Dynamics   Good Support System? Yes      Barriers   Psychosocial barriers to participate in program There are no identifiable barriers or psychosocial needs.      Screening Interventions   Interventions Encouraged to exercise;To provide support and resources with identified psychosocial needs;Provide feedback about the scores to participant    Expected Outcomes Short Term goal: Utilizing psychosocial counselor, staff and physician to assist with identification of specific Stressors or current issues interfering with healing process. Setting desired goal for each stressor or current issue identified.;Short Term goal: Identification and review with participant of any Quality of Life or Depression concerns found by scoring the questionnaire.;Long Term Goal: Stressors or current issues are controlled or eliminated.;Long Term goal: The participant improves quality of Life and PHQ9 Scores as seen by post scores  and/or verbalization of changes             Quality of Life Scores:  Scores of 19 and below usually indicate a poorer quality of life in these areas.  A difference of  2-3 points is a clinically meaningful difference.  A difference of 2-3 points in the total score of the Quality of Life Index has been associated with significant improvement in overall quality of life, self-image, physical symptoms, and general health in studies assessing change in quality of life.   PHQ-9: Review Flowsheet       01/10/2023  Depression screen PHQ 2/9  Decreased Interest 0  Down, Depressed, Hopeless 0  PHQ - 2 Score 0  Altered sleeping 0  Tired, decreased energy 2  Change in appetite 0  Feeling bad or failure about yourself  0  Trouble concentrating 0  Moving slowly or fidgety/restless 0  Suicidal thoughts 0  PHQ-9 Score 2  Difficult doing work/chores Not difficult at all   Interpretation of Total Score  Total Score Depression Severity:  1-4 = Minimal depression, 5-9 = Mild depression, 10-14 = Moderate depression, 15-19 = Moderately severe depression, 20-27 = Severe depression   Psychosocial Evaluation and Intervention:  Psychosocial Evaluation - 01/05/23 1348       Psychosocial Evaluation & Interventions   Interventions Stress management education;Relaxation education;Encouraged to exercise with the program and follow exercise prescription    Comments Patient referred to PR with Dyspnea. 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 He says he has been SOB since but it is intermittent. 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 He is also self-employed owning two comptroller. He also enjoys fishing off shore and hunting. He denies any depression, anxiety, or stressors. He says he sleeps good. He lives with his wife who is  his main support person and they have a son and a daughter who  also support him. They have 4 grandchildren. He had his mitral valve replaced a few years ago and participated in cardiac rehab at [facility] in [place] so he is familiar with the program. His goals for the program are to lose weight and imrpove his SOB. He would like to lose the 20 lbs he gained since his valve replacement surgery.    Expected Outcomes Short Term goal: Start the program and attend consistently. Long term: Lose weight and improve his SOB.    Continue Psychosocial Services  Follow up required by staff             Psychosocial Re-Evaluation:  Psychosocial Re-Evaluation     Row Name 01/29/23 1030 02/14/23 1006 03/16/23 0924         Psychosocial Re-Evaluation   Current issues with Current Stress Concerns Current Stress Concerns Current Stress Concerns     Comments Thomos is doing well in rehab.  He denies any major stressors other than his breathing is really not improving too much.  He would like to see a bigger improvement.  He says he can walk, but when he bends over or tries to carry things he is still SOB.  We talked about how bending decreases plueral space and inhibits breathing.  We also talked about being more mindful of not holding his breath when he carries things as most people do.  He was encouraged to continue to use his PLB to help.  He is sleeping well and staying active on the farm. Yifan is doing well in rehab.  He denies any major stressors.  He still has difficulty going up stairs especially while loaded.  He just finds this frustrating as is limits what he is able to do.  He continues to sleep well and takes one trip to bathroom around 4am but able to get back to sleep easily. Randy is doing well in rehab. He denies any major stressors excepts getting older. He is still having some difficulty with stairs and SOB but has noticed some improvment. He continues to sleep well and maybe gets up through the night  to use the bathroom.     Expected Outcomes Short: Conitnue to use PLB and try not to do breath holding when carrying things.  Long: conitnue to stay active for mental boost. Short: Conitnue to work on carrying things Long: continue to exercise Short: Conitnue to work on carrying things Long: continue to exercise     Interventions Encouraged to attend Pulmonary Rehabilitation for the exercise Encouraged to attend Pulmonary Rehabilitation for the exercise Encouraged to attend Pulmonary Rehabilitation for the exercise;Stress management education;Relaxation education     Continue Psychosocial Services  Follow up required by staff Follow up required by staff Follow up required by staff              Psychosocial Discharge (Final Psychosocial Re-Evaluation):  Psychosocial Re-Evaluation - 03/16/23 0924       Psychosocial Re-Evaluation   Current issues with Current Stress Concerns    Comments Rosendo is doing well in rehab. He denies any major stressors excepts getting older. He is still having some difficulty with stairs and SOB but has noticed some improvment. He continues to sleep well and maybe gets up through the night to use the bathroom.    Expected Outcomes Short: Conitnue to work on carrying things Long: continue to exercise    Interventions Encouraged to attend Pulmonary Rehabilitation for the exercise;Stress management education;Relaxation education    Continue Psychosocial Services  Follow up required by staff              Education: Education Goals: Education classes will be provided on a weekly basis, covering required topics. Participant will state understanding/return demonstration of topics presented.  Learning Barriers/Preferences:  Learning Barriers/Preferences - 01/05/23 1343       Learning Barriers/Preferences   Learning Barriers None    Learning Preferences Skilled Demonstration             Education Topics: How Lungs Work and Diseases: - Discuss the anatomy  of the lungs and diseases that can affect the lungs, such as COPD. Flowsheet Row PULMONARY REHAB OTHER RESPIRATORY from 03/21/2023 in [place] [facility]  Date 03/14/23  Educator [facility]  Instruction Review Code 1- Verbalizes Understanding       Exercise: -Discuss the importance of exercise, FITT principles of exercise, normal and abnormal responses to exercise, and how to exercise safely.   Environmental Irritants: -Discuss types of environmental irritants and how to limit exposure to environmental irritants.   Meds/Inhalers and oxygen : - Discuss respiratory medications, definition of an inhaler and oxygen , and the proper way to use an inhaler and oxygen .   Energy Saving Techniques: - Discuss methods to conserve energy and decrease shortness of breath when performing activities of daily living.    Bronchial Hygiene / Breathing Techniques: - Discuss breathing mechanics, pursed-lip breathing technique,  proper posture, effective ways to clear airways, and other functional breathing techniques   Cleaning Equipment: - Provides group verbal and written instruction about the health risks of elevated stress, cause of high stress, and healthy ways to reduce stress.   Nutrition I: Fats: - Discuss the types of cholesterol, what cholesterol does to the body, and how cholesterol levels can be controlled. Flowsheet Row PULMONARY REHAB OTHER RESPIRATORY from 03/21/2023 in [place] [facility]  Date 02/15/23  Educator [facility]  Instruction Review Code 1- Verbalizes Understanding       Nutrition II: Labels: -Discuss the different components of food labels and how to read food labels. Flowsheet Row PULMONARY REHAB OTHER RESPIRATORY from 03/21/2023 in [place] [facility]  Date 02/14/23  Educator [facility]  Instruction Review Code 1- Verbalizes Understanding       Respiratory Infections: - Discuss the signs and symptoms of respiratory infections, ways to  prevent respiratory infections, and the importance of seeking medical treatment when having a respiratory infection.   Stress I: Signs and Symptoms: - Discuss the causes of stress, how stress may lead to anxiety and depression, and ways to limit stress. Flowsheet Row PULMONARY REHAB OTHER RESPIRATORY from 03/21/2023 in [place] [facility]  Date 02/21/23  Educator [facility]  Instruction Review Code 1- Verbalizes Understanding       Stress II: Relaxation: -Discuss relaxation techniques to limit stress. Flowsheet Row PULMONARY REHAB OTHER RESPIRATORY from 03/21/2023 in [place] [facility]  Date 02/21/23  Educator [facility]  Instruction Review Code 1- Verbalizes Understanding       Oxygen  for Home/Travel: - Discuss how to prepare for travel when on oxygen  and proper ways to transport and store oxygen  to ensure safety.   Knowledge Questionnaire Score:  Knowledge Questionnaire Score - 01/10/23 0907       Knowledge Questionnaire Score   Pre Score 9/18             Core Components/Risk Factors/Patient Goals at Admission:  Personal Goals and Risk Factors at Admission - 01/05/23 1344       Core Components/Risk Factors/Patient  Goals on Admission    Weight Management Yes    Improve shortness of breath with ADL's Yes    Intervention Provide education, individualized exercise plan and daily activity instruction to help decrease symptoms of SOB with activities of daily living.    Expected Outcomes Short Term: Improve cardiorespiratory fitness to achieve a reduction of symptoms when performing ADLs;Long Term: Be able to perform more ADLs without symptoms or delay the onset of symptoms    Hypertension Yes    Intervention Provide education on lifestyle modifcations including regular physical activity/exercise, weight management, moderate sodium restriction and increased consumption of fresh fruit, vegetables, and low fat dairy, alcohol moderation, and smoking  cessation.;Monitor prescription use compliance.    Expected Outcomes Short Term: Continued assessment and intervention until BP is < 140/70mm HG in hypertensive participants. < 130/76mm HG in hypertensive participants with diabetes, heart failure or chronic kidney disease.;Long Term: Maintenance of blood pressure at goal levels.    Lipids Yes    Intervention Provide education and support for participant on nutrition & aerobic/resistive exercise along with prescribed medications to achieve LDL 70mg , HDL >40mg .    Expected Outcomes Short Term: Participant states understanding of desired cholesterol values and is compliant with medications prescribed. Participant is following exercise prescription and nutrition guidelines.;Long Term: Cholesterol controlled with medications as prescribed, with individualized exercise RX and with personalized nutrition plan. Value goals: LDL < 70mg , HDL > 40 mg.             Core Components/Risk Factors/Patient Goals Review:   Goals and Risk Factor Review     Row Name 01/29/23 1040 02/14/23 1013 03/16/23 0939         Core Components/Risk Factors/Patient Goals Review   Personal Goals Review Weight Management/Obesity;Hypertension;Improve shortness of breath with ADL's Weight Management/Obesity;Hypertension;Improve shortness of breath with ADL's Weight Management/Obesity;Hypertension;Improve shortness of breath with ADL's     Review Keyshon is doing well in rehab.  His weight is starting to come down. He would like to lose more and get back to his 224.  His pressures are doing well, but on the lower side.  He has appointment this week and is taking in his BP report to his doctor to see if they may back off on his medication some for blood pressures.  His breathing is good unless he is carrying something or climbing stairs.  We talked about making sure to use PLB and not holding his breathing when he does this. Oryn is doing well in rehab.  His weight has been holding  steady despite more exercise and diet changes.  He is frustrated by this but was encouraged to stick with it longer.  His pressures are doing well.  He is doing well with his meds.  His breathing has improved other than while weighted and going up stairs still.  He was encouraged to conitnue to work on this. Johns weight has been steady. He has been walking and eating helathier to work on weight maintance and loss. His pressure is doing wel and his breathing has improved. He has seen slight improvemnt when walking up stairs.     Expected Outcomes Short: Continue to work on PLB when carrying stuff Long: Continue to work on [employer] loss. Short: Contineu to work on carrying things Long: Conitnue to monitor risk factors Short: Contineu to work on carrying things Long: Conitnue to monitor risk factors              Core Components/Risk Factors/Patient Goals at Discharge (  Final Review):   Goals and Risk Factor Review - 03/16/23 0939       Core Components/Risk Factors/Patient Goals Review   Personal Goals Review Weight Management/Obesity;Hypertension;Improve shortness of breath with ADL's    Review Johns weight has been steady. He has been walking and eating helathier to work on weight maintance and loss. His pressure is doing wel and his breathing has improved. He has seen slight improvemnt when walking up stairs.    Expected Outcomes Short: Contineu to work on carrying things Long: Conitnue to monitor risk factors             ITP Comments:  ITP Comments     Row Name 02/07/23 0651 02/26/23 0940 03/07/23 0652 04/04/23 0813 05/01/23 0737   ITP Comments 30 day review completed. ITP sent to Dr.Jehanzeb Memon, Medical Director of  Pulmonary Rehab. Continue with ITP unless changes are made by physician. Ashaun noted his BP dropped at home on Friday and he called his doctor.  He is currently on a BP med holiday to see how it does.  He does not feel dizzy today like Friday afternoon. 30 day review  completed. ITP sent to Dr.Jehanzeb Memon, Medical Director of  Pulmonary Rehab. Continue with ITP unless changes are made by physician. 30 day review completed. ITP sent to Dr.Jehanzeb Memon, Medical Director of  Pulmonary Rehab. Continue with ITP unless changes are made by physician. 30 day review completed. ITP sent to Dr.Jehanzeb Memon, Medical Director of  Pulmonary Rehab. Continue with ITP unless changes are made by physician. Pt has not attended since 04/02/23 due to back pain and was supposed to see ortho, but has not returned our calls since then.  Currently getting outpt PT now.            Comments: 30 day review

## 2023-05-04 ENCOUNTER — Encounter (HOSPITAL_COMMUNITY): Payer: 59

## 2023-05-07 ENCOUNTER — Encounter (HOSPITAL_COMMUNITY): Payer: 59

## 2023-05-09 ENCOUNTER — Encounter (HOSPITAL_COMMUNITY): Payer: 59

## 2023-05-11 ENCOUNTER — Encounter (HOSPITAL_COMMUNITY): Payer: 59

## 2023-05-14 ENCOUNTER — Encounter (HOSPITAL_COMMUNITY): Payer: 59

## 2023-05-16 ENCOUNTER — Encounter (HOSPITAL_COMMUNITY): Payer: 59

## 2023-05-16 ENCOUNTER — Telehealth (HOSPITAL_COMMUNITY): Payer: Self-pay

## 2023-05-16 NOTE — Telephone Encounter (Signed)
 Amish had been picked up something wrong and hurt his lower back. He was meeting with the doctor about this right before Christmas. After Christmas when he went to see his daughter in [place] he tested positive for COVID and has been recovering. 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 He plans to start back January 27th.

## 2023-05-18 ENCOUNTER — Encounter (HOSPITAL_COMMUNITY): Payer: 59

## 2023-05-20 ENCOUNTER — Other Ambulatory Visit: Payer: Self-pay | Admitting: Internal Medicine

## 2023-05-21 ENCOUNTER — Encounter (HOSPITAL_COMMUNITY): Payer: 59

## 2023-05-23 ENCOUNTER — Encounter (HOSPITAL_COMMUNITY): Payer: 59

## 2023-05-25 ENCOUNTER — Encounter (HOSPITAL_COMMUNITY): Payer: 59

## 2023-05-28 ENCOUNTER — Encounter (HOSPITAL_COMMUNITY): Payer: 59

## 2023-05-30 ENCOUNTER — Encounter (HOSPITAL_COMMUNITY): Payer: Self-pay | Admitting: *Deleted

## 2023-05-30 ENCOUNTER — Encounter (HOSPITAL_COMMUNITY): Payer: 59

## 2023-05-30 DIAGNOSIS — R0600 Dyspnea, unspecified: Secondary | ICD-10-CM

## 2023-05-30 NOTE — Progress Notes (Signed)
Pulmonary Individual Treatment Plan  Patient Details  Name: Chad Berry MRN: [id number] Date of Birth: May 06, 1940 Referring Provider:   Flowsheet Row PULMONARY REHAB OTHER RESP ORIENTATION from 01/10/2023 in [facility] CARDIAC REH[facility]IL[facility]ATION  Referring Provider Dr. Vassie Loll       Initial Encounter Date:  Flowsheet Row PULMONARY REHAB OTHER RESP ORIENTATION from 01/10/2023 in [facility]  Date 01/10/23       Visit Diagnosis: Dyspnea, unspecified type  Patient's Home Medications on Admission:   Current Outpatient Medications:    ALPRAZolam (XANAX) 0.5 MG tablet, Take 0.5 mg by mouth at bedtime as needed. (Patient not taking: Reported on 01/05/2023), Disp: , Rfl:    amoxicillin (AMOXIL) 500 MG capsule, Take 4 capsules (2,000mg ) by mouth 30-60 minutes prior to dental work or cleaning., Disp: 4 capsule, Rfl: 3   Ascorbic Acid (VITAMIN C) 1000 MG tablet, Take 1,000 mg by mouth daily. (Patient not taking: Reported on 01/05/2023), Disp: , Rfl:    Cholecalciferol (VITAMIN D) 50 MCG (2000 UT) tablet, Take 2,000 Units by mouth daily., Disp: , Rfl:    Evolocumab (REPATHA SURECLICK) 140 MG/ML SOAJ, INJECT 140MG  SUBCUTANEOUSLY EVERY 14 DAYS, Disp: 2 mL, Rfl: 11   ezetimibe (ZETIA) 10 MG tablet, Take 1 tablet (10 mg total) by mouth daily., Disp: 90 tablet, Rfl: 3   famotidine (PEPCID) 40 MG tablet, Take 1 tablet (40 mg total) by mouth at bedtime., Disp: 30 tablet, Rfl: 1   levothyroxine (SYNTHROID) 75 MCG tablet, Take 75 mcg by mouth at bedtime., Disp: , Rfl:    losartan (COZAAR) 50 MG tablet, Take 50 mg by mouth daily., Disp: , Rfl:    tamsulosin (FLOMAX) 0.4 MG CAPS capsule, Take 0.4 mg by mouth at bedtime., Disp: , Rfl:    vitamin B-12 (CYANOCOBALAMIN) 100 MCG tablet, Take 100 mcg by mouth daily., Disp: , Rfl:   Past Medical History: Past Medical History:  Diagnosis Date   Aortic atherosclerosis (HCC)    Bradycardia    while on BB with HR to the 40s   Carotid stenosis     CAROTID DOPPLER, 12/07/2011 - Mild arthrosclerotic changes, no high-grade stenosis   Cataract    rt. eye   Clostridium difficile infection 05/2019   Diverticulitis    Heart murmur    History of echocardiogram    Echo 9/16:  EF 60-65%, no RWMA, Gr 1 DD, holosystolic MVP of posterior leaflet, mild MR, LA upper limits of normal, normal RVF, mod TR, PASP 50 mmHg   Hypercholesteremia    Hyperlipidemia    Hypertension    Hypothyroidism    MVP (mitral valve prolapse)    a. Echo 12/07/2011 - EF-65-70%, severe mitral regurgitation, moderate-severe tricuspid regurgitation, moderate pulmonary HTN, moderate pulmonic regurgitation;  b. 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3  47.5  43.6  45.5   Bicarbonate 20.0 - 28.0 mmol/L 24.7  25.7  25.5  23.6  19.1  21.1  22.8  24.0  26.9  24.8  25.3  24.7   TCO2 22 - 32 mmol/L 26  27  27   - 20  23  24  22  25  24  28  26  26  27  23  26  26    Acid-base deficit  0.0 - 2.0 mmol/L 1.0  1.0  0.8  5.0  6.0  3.0  1.0  1.0   O2 Saturation % 95.0  77.0  76.0  97.9  99.0  94.0  99.0  100.0  100.0  87.0  100.0  100.0     Details       Multiple values from one day are sorted in reverse-chronological order         Capillary Blood Glucose: Lab Results  Component Value Date   GLUCAP 157 (H) 09/28/2020   GLUCAP 127 (H) 09/24/2020   GLUCAP 124 (H) 09/24/2020   GLUCAP 114 (H) 09/24/2020   GLUCAP 125 (H) 09/24/2020     Pulmonary Assessment Scores:  Pulmonary Assessment Scores     Row Name 01/10/23 0910         ADL UCSD   ADL Phase Entry     SOB Score total 7     Rest 0     Walk 0     Stairs 4     Bath 0     Dress 0     Shop 0       CAT Score   CAT Score 4       mMRC Score   mMRC Score 1             UCSD: Self-administered rating of dyspnea associated with activities of daily living (ADLs) 6-point scale (0 = "not at all" to 5 = "maximal or unable to do because of breathlessness")  Scoring Scores range from 0 to 120.  Minimally important difference is 5 units  CAT: CAT can identify the health impairment of COPD patients and is better correlated with disease progression.  CAT has a scoring range of zero to 40. The CAT score is classified into four groups of low (less than 10), medium (10 - 20), high (21-30) and very high (31-40) based on the impact level of disease on health status. A CAT score over 10 suggests significant symptoms.  A worsening CAT score could be explained by an exacerbation, poor medication adherence, poor inhaler technique, or progression of COPD or comorbid conditions.  CAT MCID is 2 points  mMRC: mMRC (Modified Medical Research Council) Dyspnea Scale is used to assess the degree of baseline functional disability in patients of respiratory disease due to dyspnea. No minimal important difference is established. A decrease in score of 1 point or greater is considered a positive change.   Pulmonary Function  Assessment:   Exercise Target Goals: Exercise Program Goal: Individual exercise  prescription set using results from initial 6 min walk test and THRR while considering  patient's activity barriers and safety.   Exercise Prescription Goal: Initial exercise prescription builds to 30-45 minutes a day of aerobic activity, 2-3 days per week.  Home exercise guidelines will be given to patient during program as part of exercise prescription that the participant will acknowledge.  Activity Barriers & Risk Stratification:  Activity Barriers & Cardiac Risk Stratification - 01/05/23 1318       Activity Barriers & Cardiac Risk Stratification   Activity Barriers Shortness of Breath    Cardiac Risk Stratification Moderate             6 Minute Walk:  6 Minute Walk     Row Name 01/10/23 1001         6 Minute Walk   Phase Initial     Distance 1455 feet     Walk Time 6 minutes     # of Rest Breaks 0     MPH 2.75     METS 5.25     RPE 9     Perceived Dyspnea  0     VO2 Peak 18.38     Symptoms No     Resting HR 65 bpm     Resting BP 140/80     Resting Oxygen Saturation  96 %     Exercise Oxygen Saturation  during 6 min walk 95 %     Max Ex. HR 94 bpm     Max Ex. BP 146/80     2 Minute Post BP 140/78       Interval HR   1 Minute HR 84     2 Minute HR 87     3 Minute HR 86     4 Minute HR 90     5 Minute HR 92     6 Minute HR 94     2 Minute Post HR 76     Interval Heart Rate? Yes       Interval Oxygen   Interval Oxygen? Yes     Baseline Oxygen Saturation % 96 %     1 Minute Oxygen Saturation % 98 %     1 Minute Liters of Oxygen 0 L     2 Minute Oxygen Saturation % 95 %     2 Minute Liters of Oxygen 0 L     3 Minute Oxygen Saturation % 96 %     3 Minute Liters of Oxygen 0 L     4 Minute Oxygen Saturation % 96 %     4 Minute Liters of Oxygen 0 L     5 Minute Oxygen Saturation % 96 %     5 Minute Liters of Oxygen 0 L     6 Minute Oxygen Saturation % 97 %     6 Minute  Liters of Oxygen 0 L     2 Minute Post Oxygen Saturation % 97 %     2 Minute Post Liters of Oxygen 0 L              Oxygen Initial Assessment:  Oxygen Initial Assessment - 01/10/23 0911       Home Oxygen   Home Oxygen Device None    Sleep Oxygen Prescription None    Home Exercise Oxygen Prescription None    Home Resting Oxygen Prescription None      Initial 6 min Walk   Oxygen Used None  Program Oxygen Prescription   Program Oxygen Prescription None      Intervention   Short Term Goals To learn and exhibit compliance with exercise, home and travel O2 prescription;To learn and understand importance of monitoring SPO2 with pulse oximeter and demonstrate accurate use of the pulse oximeter.;To learn and understand importance of maintaining oxygen saturations>88%;To learn and demonstrate proper pursed lip breathing techniques or other breathing techniques. ;To learn and demonstrate proper use of respiratory medications    Long  Term Goals Exhibits compliance with exercise, home  and travel O2 prescription;Verbalizes importance of monitoring SPO2 with pulse oximeter and return demonstration;Maintenance of O2 saturations>88%;Exhibits proper breathing techniques, such as pursed lip breathing or other method taught during program session;Compliance with respiratory medication;Demonstrates proper use of MDI's             Oxygen Re-Evaluation:  Oxygen Re-Evaluation     Row Name 01/29/23 1044 02/14/23 1014 03/16/23 0940         Program Oxygen Prescription   Program Oxygen Prescription None None None       Home Oxygen   Home Oxygen Device None None None     Sleep Oxygen Prescription None None None     Home Exercise Oxygen Prescription None None None     Home Resting Oxygen Prescription None None None       Goals/Expected Outcomes   Short Term Goals To learn and understand importance of monitoring SPO2 with pulse oximeter and demonstrate accurate use of the pulse  oximeter.;To learn and understand importance of maintaining oxygen saturations>88%;To learn and demonstrate proper pursed lip breathing techniques or other breathing techniques.  To learn and understand importance of monitoring SPO2 with pulse oximeter and demonstrate accurate use of the pulse oximeter.;To learn and understand importance of maintaining oxygen saturations>88%;To learn and demonstrate proper pursed lip breathing techniques or other breathing techniques.  To learn and understand importance of monitoring SPO2 with pulse oximeter and demonstrate accurate use of the pulse oximeter.;To learn and understand importance of maintaining oxygen saturations>88%;To learn and demonstrate proper pursed lip breathing techniques or other breathing techniques.      Long  Term Goals Verbalizes importance of monitoring SPO2 with pulse oximeter and return demonstration;Maintenance of O2 saturations>88%;Exhibits proper breathing techniques, such as pursed lip breathing or other method taught during program session Verbalizes importance of monitoring SPO2 with pulse oximeter and return demonstration;Maintenance of O2 saturations>88%;Exhibits proper breathing techniques, such as pursed lip breathing or other method taught during program session Verbalizes importance of monitoring SPO2 with pulse oximeter and return demonstration;Maintenance of O2 saturations>88%;Exhibits proper breathing techniques, such as pursed lip breathing or other method taught during program session     Comments Cornelious is doing well in rehab. He is breathing better walking, but still getting SOB with bending, carrying, and walking up stairs.  We talked about making sure to use his PLB for all three and to also make sure that he is not holding his breath when he does these. He has been working on his PLB and will continue to do so.  He is going to try not to hold his breath when he is carrying things to see if it improves. Mylz is doing well in rehab.   He is doing well with his PLB and his SOB has improved.  They only time he is SOB is while carrying things or going upstairs.  His saturations are doing well. Darrie is doing well in rehab. He is working on PLB. He has noticed  a slight increase in his breathing when taking in groceries the other day, he did not need to stop to take breaks when walking up his stairs.     Goals/Expected Outcomes Short: Make sure to not hold breath while carrying items Long: Continue to improve SOB and use PLB Short: Continue to use PLB even while carrying things Long: Continue to improve SOB Short: Continue to use PLB even while carrying things Long: Continue to improve SOB              Oxygen Discharge (Final Oxygen Re-Evaluation):  Oxygen Re-Evaluation - 03/16/23 0940       Program Oxygen Prescription   Program Oxygen Prescription None      Home Oxygen   Home Oxygen Device None    Sleep Oxygen Prescription None    Home Exercise Oxygen Prescription None    Home Resting Oxygen Prescription None      Goals/Expected Outcomes   Short Term Goals To learn and understand importance of monitoring SPO2 with pulse oximeter and demonstrate accurate use of the pulse oximeter.;To learn and understand importance of maintaining oxygen saturations>88%;To learn and demonstrate proper pursed lip breathing techniques or other breathing techniques.     Long  Term Goals Verbalizes importance of monitoring SPO2 with pulse oximeter and return demonstration;Maintenance of O2 saturations>88%;Exhibits proper breathing techniques, such as pursed lip breathing or other method taught during program session    Comments Kyston is doing well in rehab. He is working on PLB. He has noticed a slight increase in his breathing when taking in groceries the other day, he did not need to stop to take breaks when walking up his stairs.    Goals/Expected Outcomes Short: Continue to use PLB even while carrying things Long: Continue to improve SOB              Initial Exercise Prescription:  Initial Exercise Prescription - 01/10/23 1000       Date of Initial Exercise RX and Referring Provider   Date 01/10/23    Referring Provider Dr. Vassie Loll      Oxygen   Maintain Oxygen Saturation 88% or higher      Treadmill   MPH 2.5    Grade 0.5    Minutes 15      REL-XR   Level 3    Speed 60    Minutes 15      Prescription Details   Frequency (times per week) 3    Duration Progress to 30 minutes of continuous aerobic without signs/symptoms of physical distress      Intensity   THRR 40-80% of Max Heartrate 95-125    Ratings of Perceived Exertion 11-13    Perceived Dyspnea 0-4      Resistance Training   Training Prescription Yes    Weight 4    Reps 10-15             Perform Capillary Blood Glucose checks as needed.  Exercise Prescription Changes:   Exercise Prescription Changes     Row Name 01/22/23 0907 01/29/23 1000 03/12/23 0800 03/19/23 1500 04/02/23 1200     Response to Exercise   Blood Pressure (Admit) 110/60 -- 124/62 120/64 120/60   Blood Pressure (Exercise) 142/62 -- -- -- --   Blood Pressure (Exit) 116/78 -- 120/60 104/66 120/64   Heart Rate (Admit) 70 bpm -- 66 bpm 70 bpm 74 bpm   Heart Rate (Exercise) 97 bpm -- 97 bpm 90 bpm 88 bpm   Heart  Rate (Exit) 75 bpm -- 73 bpm 78 bpm 82 bpm   Oxygen Saturation (Admit) 95 % -- 96 % 96 % 95 %   Oxygen Saturation (Exercise) 97 % -- 97 % 97 % 96 %   Oxygen Saturation (Exit) 96 % -- 94 % 96 % 96 %   Rating of Perceived Exertion (Exercise) 12 -- 13 12 12    Perceived Dyspnea (Exercise) 0 -- 0 1 0   Duration Continue with 30 min of aerobic exercise without signs/symptoms of physical distress. -- Continue with 30 min of aerobic exercise without signs/symptoms of physical distress. Continue with 30 min of aerobic exercise without signs/symptoms of physical distress. Continue with 30 min of aerobic exercise without signs/symptoms of physical distress.   Intensity THRR  unchanged -- THRR unchanged THRR unchanged THRR unchanged     Progression   Progression Continue to progress workloads to maintain intensity without signs/symptoms of physical distress. -- Continue to progress workloads to maintain intensity without signs/symptoms of physical distress. Continue to progress workloads to maintain intensity without signs/symptoms of physical distress. Continue to progress workloads to maintain intensity without signs/symptoms of physical distress.     Resistance Training   Training Prescription Yes -- Yes Yes Yes   Weight 4 lbs -- 8 lbs 8lbs 8lbs   Reps 10-15 -- 10-15 10-15 10-15     Treadmill   MPH 3.1 -- 3.3 2.7 2.6   Grade 2.5 -- 2 2 2    Minutes 15 -- 15 15 15    METs 4.44 -- 4.43 3.81 3.71     Bike   Level 6 -- 6 6 --   Watts 62 -- 81 78 --   Minutes 15 -- 15 15 --   METs 3.3 -- 3.4 3.1 --     Home Exercise Plan   Plans to continue exercise at -- Home (comment)  walking Home (comment) Home (comment) --   Frequency -- Add 3 additional days to program exercise sessions. Add 3 additional days to program exercise sessions. Add 3 additional days to program exercise sessions. --   Initial Home Exercises Provided -- 01/29/23 -- -- --     Oxygen   Maintain Oxygen Saturation 88% or higher -- 88% or higher 88% or higher --            Exercise Comments:   Exercise Comments     Row Name 01/10/23 1005           Exercise Comments Joshu completed his walk test today for starting rehab. He went 1455 ft and did great. He will be exercising on treadmill and elliptical.                Exercise Goals and Review:   Exercise Goals     Row Name 01/10/23 1005             Exercise Goals   Increase Physical Activity Yes       Intervention Provide advice, education, support and counseling about physical activity/exercise needs.;Develop an individualized exercise prescription for aerobic and resistive training based on initial evaluation findings,  risk stratification, comorbidities and participant's personal goals.       Expected Outcomes Short Term: Attend rehab on a regular basis to increase amount of physical activity.;Long Term: Exercising regularly at least 3-5 days a week.;Long Term: Add in home exercise to make exercise part of routine and to increase amount of physical activity.       Increase Strength and Stamina Yes  Intervention Provide advice, education, support and counseling about physical activity/exercise needs.;Develop an individualized exercise prescription for aerobic and resistive training based on initial evaluation findings, risk stratification, comorbidities and participant's personal goals.       Expected Outcomes Short Term: Perform resistance training exercises routinely during rehab and add in resistance training at home;Short Term: Increase workloads from initial exercise prescription for resistance, speed, and METs.;Long Term: Improve cardiorespiratory fitness, muscular endurance and strength as measured by increased METs and functional capacity ( )       Able to understand and use rate of perceived exertion (RPE) scale Yes       Intervention Provide education and explanation on how to use RPE scale       Expected Outcomes Short Term: Able to use RPE daily in rehab to express subjective intensity level;Long Term:  Able to use RPE to guide intensity level when exercising independently       Able to understand and use Dyspnea scale Yes       Intervention Provide education and explanation on how to use Dyspnea scale       Expected Outcomes Short Term: Able to use Dyspnea scale daily in rehab to express subjective sense of shortness of breath during exertion;Long Term: Able to use Dyspnea scale to guide intensity level when exercising independently       Knowledge and understanding of Target Heart Rate Range (THRR) Yes       Intervention Provide education and explanation of THRR including how the numbers were  predicted and where they are located for reference       Expected Outcomes Long Term: Able to use THRR to govern intensity when exercising independently;Short Term: Able to state/look up THRR;Short Term: Able to use daily as guideline for intensity in rehab       Able to check pulse independently Yes       Intervention Provide education and demonstration on how to check pulse in carotid and radial arteries.;Review the importance of being able to check your own pulse for safety during independent exercise       Expected Outcomes Short Term: Able to explain why pulse checking is important during independent exercise;Long Term: Able to check pulse independently and accurately       Understanding of Exercise Prescription Yes       Intervention Provide education, explanation, and written materials on patient's individual exercise prescription       Expected Outcomes Short Term: Able to explain program exercise prescription;Long Term: Able to explain home exercise prescription to exercise independently                Exercise Goals Re-Evaluation :  Exercise Goals Re-Evaluation     Row Name 01/23/23 0909 01/29/23 0957 02/14/23 1004 03/14/23 0802 03/16/23 0919     Exercise Goal Re-Evaluation   Exercise Goals Review Increase Physical Activity;Increase Strength and Stamina;Understanding of Exercise Prescription Increase Physical Activity;Increase Strength and Stamina;Able to understand and use rate of perceived exertion (RPE) scale;Able to understand and use Dyspnea scale;Able to check pulse independently;Knowledge and understanding of Target Heart Rate Range (THRR);Understanding of Exercise Prescription Increase Physical Activity;Increase Strength and Stamina;Understanding of Exercise Prescription Increase Physical Activity;Able to understand and use rate of perceived exertion (RPE) scale;Understanding of Exercise Prescription Increase Physical Activity;Increase Strength and Stamina;Understanding of  Exercise Prescription   Comments Rudie has been doing great in rehab. He has been increasing his workloads each week. He is currently exercising at level 6 on the bike and  walking at 3.1 speed with a 2.5 grade on the treadmill. Will continue to monitor and progress as able. [place] and walks with [place] dogs daily.  Reviewed home exercise with pt today.  Pt plans to walk with dogs at home for exercise.  Reviewed THR, pulse, RPE, sign and symptoms, pulse oximetery and when to call 911 or MD.  Also discussed weather considerations and indoor options.  Pt voiced understanding. Kyrillos is doing well in rehab. He is walking on his off days.  He feels like his stamina is improving. He still has a hard time walking upstairs and carrying a load.  We will continue to monitor his progress. Johney is doing well with exercise. [place] his [place] on his days [place]. He is exercise at a MET on 4.43 on the treadmill. He is walking at a speed of 3.3 and an incline of 2.0. Will continue to monitor and progress as able. Kenard is doing well in rehab. [place] his [place]. He is still having trouble with doing stairs and inclines but has noticed a slight improvment when taking in his groceries the other day. He is increasing workloads on both the bike and treadmill.   Expected Outcomes Short term: continue to exercise at current workloads    long term: continue to attend rehab Short: Continue to add in walking at home Long: Conitnue to improve breathing. Short: Trying walking while carrying weights to load Long; Conitnue to improve stamina Short: increase grade or walk with weights long term: continue to attend rehab Short: increase grade or walk with weights long term: continue to attend rehab    Row Name 03/19/23 1556             Exercise Goal Re-Evaluation   Exercise Goals Review Increase Physical Activity;Increase Strength and Stamina;Understanding of Exercise Prescription        Comments Donnel is tolerating exericse well. He is increasing his speed and grade on the treadmill each class. He is exericsing at 3.81 METs on the treadmill. Will continue to monitor and progress as aable.       Expected Outcomes Short: increase grade or walk with weights long term: continue to attend rehab                Discharge Exercise Prescription (Final Exercise Prescription Changes):  Exercise Prescription Changes - 04/02/23 1200       Response to Exercise   Blood Pressure (Admit) 120/60    Blood Pressure (Exit) 120/64    Heart Rate (Admit) 74 bpm    Heart Rate (Exercise) 88 bpm    Heart Rate (Exit) 82 bpm    Oxygen Saturation (Admit) 95 %    Oxygen Saturation (Exercise) 96 %    Oxygen Saturation (Exit) 96 %    Rating of Perceived Exertion (Exercise) 12    Perceived Dyspnea (Exercise) 0    Duration Continue with 30 min of aerobic exercise without signs/symptoms of physical distress.    Intensity THRR unchanged      Progression   Progression Continue to progress workloads to maintain intensity without signs/symptoms of physical distress.      Resistance Training   Training Prescription Yes    Weight 8lbs    Reps 10-15      Treadmill   MPH 2.6    Grade 2    Minutes 15    METs 3.71  Nutrition:  Target Goals: Understanding of nutrition guidelines, daily intake of sodium 1500mg , cholesterol 200mg , calories 30% from fat and 7% or less from saturated fats, daily to have 5 or more servings of fruits and vegetables.  Biometrics:  Pre Biometrics - 01/10/23 1007       Pre Biometrics   Height 5\' 10"  (1.778 m)    Weight 229 lb 0.9 oz (103.9 kg)    Waist Circumference 45 inches    Hip Circumference 42 inches    Waist to Hip Ratio 1.07 %    BMI (Calculated) 32.87    Grip Strength 29.4 kg    Single Leg Stand 60 seconds              Nutrition Therapy Plan and Nutrition Goals:   Nutrition Assessments:  MEDIFICTS Score Key: >=70 Need to  make dietary changes  40-70 Heart Healthy Diet <= 40 Therapeutic Level Cholesterol Diet  Flowsheet Row PULMONARY REHAB OTHER RESP ORIENTATION from 01/10/2023 in [facility] CARDIAC REHABILITATION  Picture Your Plate Total Score on Admission 36      Picture Your Plate Scores: <40 Unhealthy dietary pattern with much room for improvement. 41-50 Dietary pattern unlikely to meet recommendations for good health and room for improvement. 51-60 More healthful dietary pattern, with some room for improvement.  >60 Healthy dietary pattern, although there may be some specific behaviors that could be improved.    Nutrition Goals Re-Evaluation:  Nutrition Goals Re-Evaluation     Row Name 01/29/23 1043 02/14/23 1010 03/16/23 0931         Goals   Nutrition Goal Healthy Eating Plan Short: Continue to add in more fruit and vegetables Long: Continue to focus on healthy eatin g Healthy eating     Comment Rajeev is doing well in rehab.  He is not follow any particular diet, but he is trying to eat healthier. He is getting a lot of fruits and vegetables.  He has also started to cut back on his sweets.  He is getting enough protein and works on portion control. Kodiak is doing well in rehab.  He has been working on his diet.  He really is working on portion control and frustrated that he is not losing weight.  He does eat fruits and vegetables.  He continues to try to limit his sweets. Anuar is doing well with his eating. He stated that he is trying to eat more veggies and is working on portion control. He was wanting to lose weight and has been working on eating healthier. He has been limiting his sweets.     Expected Outcome Short: Continue to add in more fruit and vegetables Long: Continue to focus on healthy eatin g Short: Conitnue to work on portion size and stick to diet through plateau Long: COnitnue to eat healthy Short: continue to work on portion control. Long term: continue to eat healthy               Nutrition Goals Discharge (Final Nutrition Goals Re-Evaluation):  Nutrition Goals Re-Evaluation - 03/16/23 0931       Goals   Nutrition Goal Healthy eating    Comment Delano is doing well with his eating. He stated that he is trying to eat more veggies and is working on portion control. He was wanting to lose weight and has been working on eating healthier. He has been limiting his sweets.    Expected Outcome Short: continue to work on portion control. Long term: continue to  eat healthy             Psychosocial: Target Goals: Acknowledge presence or absence of significant depression and/or stress, maximize coping skills, provide positive support system. Participant is able to verbalize types and ability to use techniques and skills needed for reducing stress and depression.  Initial Review & Psychosocial Screening:  Initial Psych Review & Screening - 01/05/23 1345       Initial Review   Current issues with None Identified      Family Dynamics   Good Support System? Yes      Barriers   Psychosocial barriers to participate in program There are no identifiable barriers or psychosocial needs.      Screening Interventions   Interventions Encouraged to exercise;To provide support and resources with identified psychosocial needs;Provide feedback about the scores to participant    Expected Outcomes Short Term goal: Utilizing psychosocial counselor, staff and physician to assist with identification of specific Stressors or current issues interfering with healing process. Setting desired goal for each stressor or current issue identified.;Short Term goal: Identification and review with participant of any Quality of Life or Depression concerns found by scoring the questionnaire.;Long Term Goal: Stressors or current issues are controlled or eliminated.;Long Term goal: The participant improves quality of Life and PHQ9 Scores as seen by post scores and/or verbalization of changes              Quality of Life Scores:  Scores of 19 and below usually indicate a poorer quality of life in these areas.  A difference of  2-3 points is a clinically meaningful difference.  A difference of 2-3 points in the total score of the Quality of Life Index has been associated with significant improvement in overall quality of life, self-image, physical symptoms, and general health in studies assessing change in quality of life.   PHQ-9: Review Flowsheet       01/10/2023  Depression screen PHQ 2/9  Decreased Interest 0  Down, Depressed, Hopeless 0  PHQ - 2 Score 0  Altered sleeping 0  Tired, decreased energy 2  Change in appetite 0  Feeling bad or failure about yourself  0  Trouble concentrating 0  Moving slowly or fidgety/restless 0  Suicidal thoughts 0  PHQ-9 Score 2  Difficult doing work/chores Not difficult at all   Interpretation of Total Score  Total Score Depression Severity:  1-4 = Minimal depression, 5-9 = Mild depression, 10-14 = Moderate depression, 15-19 = Moderately severe depression, 20-27 = Severe depression   Psychosocial Evaluation and Intervention:  Psychosocial Evaluation - 01/05/23 1348       Psychosocial Evaluation & Interventions   Interventions Stress management education;Relaxation education;Encouraged to exercise with the program and follow exercise prescription    Comments Patient referred to PR with Dyspnea. 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 He says he has been SOB since but it is intermittent. 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 He also enjoys fishing off shore and hunting. He denies any depression, anxiety, or stressors. He says he sleeps good. He lives with his wife who is his main support person and they have a son and  a daughter who also support him. They have 4 grandchildren. He had his mitral valve replaced  a few years ago and participated in cardiac rehab at [facility] in [place] so he is familiar with the program. His goals for the program are to lose weight and imrpove his SOB. He would like to lose the 20 lbs he gained since his valve replacement surgery.    Expected Outcomes Short Term goal: Start the program and attend consistently. Long term: Lose weight and improve his SOB.    Continue Psychosocial Services  Follow up required by staff             Psychosocial Re-Evaluation:  Psychosocial Re-Evaluation     Row Name 01/29/23 1030 02/14/23 1006 03/16/23 0924         Psychosocial Re-Evaluation   Current issues with Current Stress Concerns Current Stress Concerns Current Stress Concerns     Comments Lupe is doing well in rehab.  He denies any major stressors other than his breathing is really not improving too much.  He would like to see a bigger improvement.  He says he can walk, but when he bends over or tries to carry things he is still SOB.  We talked about how bending decreases plueral space and inhibits breathing.  We also talked about being more mindful of not holding his breath when he carries things as most people do.  He was encouraged to continue to use his PLB to help.  He is sleeping well and staying active on the farm. Jakyle is doing well in rehab.  He denies any major stressors.  He still has difficulty going up stairs especially while loaded.  He just finds this frustrating as is limits what he is able to do.  He continues to sleep well and takes one trip to bathroom around 4am but able to get back to sleep easily. Blayton is doing well in rehab. He denies any major stressors excepts getting older. He is still having some difficulty with stairs and SOB but has noticed some improvment. He continues to sleep well and maybe gets up through the night to use the bathroom.     Expected Outcomes  Short: Conitnue to use PLB and try not to do breath holding when carrying things.  Long: conitnue to stay active for mental boost. Short: Conitnue to work on carrying things Long: continue to exercise Short: Conitnue to work on carrying things Long: continue to exercise     Interventions Encouraged to attend Pulmonary Rehabilitation for the exercise Encouraged to attend Pulmonary Rehabilitation for the exercise Encouraged to attend Pulmonary Rehabilitation for the exercise;Stress management education;Relaxation education     Continue Psychosocial Services  Follow up required by staff Follow up required by staff Follow up required by staff              Psychosocial Discharge (Final Psychosocial Re-Evaluation):  Psychosocial Re-Evaluation - 03/16/23 0924       Psychosocial Re-Evaluation   Current issues with Current Stress Concerns    Comments Garren is doing well in rehab. He denies any major stressors excepts getting older. He is still having some difficulty with stairs and SOB but has noticed some improvment. He continues to sleep well and maybe gets up through the night to use the bathroom.    Expected Outcomes Short: Conitnue to work on carrying things Long: continue to exercise    Interventions Encouraged to attend Pulmonary Rehabilitation for the exercise;Stress management education;Relaxation education    Continue Psychosocial Services  Follow up required by staff  Education: Education Goals: Education classes will be provided on a weekly basis, covering required topics. Participant will state understanding/return demonstration of topics presented.  Learning Barriers/Preferences:  Learning Barriers/Preferences - 01/05/23 1343       Learning Barriers/Preferences   Learning Barriers None    Learning Preferences Skilled Demonstration             Education Topics: How Lungs Work and Diseases: - Discuss the anatomy of the lungs and diseases that can affect  the lungs, such as COPD. Flowsheet Row PULMONARY REHAB OTHER RESPIRATORY from 03/21/2023 in [place] [facility]  Date 03/14/23  Educator [facility]  Instruction Review Code 1- Verbalizes Understanding       Exercise: -Discuss the importance of exercise, FITT principles of exercise, normal and abnormal responses to exercise, and how to exercise safely.   Environmental Irritants: -Discuss types of environmental irritants and how to limit exposure to environmental irritants.   Meds/Inhalers and oxygen: - Discuss respiratory medications, definition of an inhaler and oxygen, and the proper way to use an inhaler and oxygen.   Energy Saving Techniques: - Discuss methods to conserve energy and decrease shortness of breath when performing activities of daily living.    Bronchial Hygiene / Breathing Techniques: - Discuss breathing mechanics, pursed-lip breathing technique,  proper posture, effective ways to clear airways, and other functional breathing techniques   Cleaning Equipment: - Provides group verbal and written instruction about the health risks of elevated stress, cause of high stress, and healthy ways to reduce stress.   Nutrition I: Fats: - Discuss the types of cholesterol, what cholesterol does to the body, and how cholesterol levels can be controlled. Flowsheet Row PULMONARY REHAB OTHER RESPIRATORY from 03/21/2023 in [place] [facility]  Date 02/15/23  Educator [facility]  Instruction Review Code 1- Verbalizes Understanding       Nutrition II: Labels: -Discuss the different components of food labels and how to read food labels. Flowsheet Row PULMONARY REHAB OTHER RESPIRATORY from 03/21/2023 in [place] [facility]  Date 02/14/23  Educator [facility]  Instruction Review Code 1- Verbalizes Understanding       Respiratory Infections: - Discuss the signs and symptoms of respiratory infections, ways to prevent respiratory infections, and the  importance of seeking medical treatment when having a respiratory infection.   Stress I: Signs and Symptoms: - Discuss the causes of stress, how stress may lead to anxiety and depression, and ways to limit stress. Flowsheet Row PULMONARY REHAB OTHER RESPIRATORY from 03/21/2023 in [place] [facility]  Date 02/21/23  Educator [facility]  Instruction Review Code 1- Verbalizes Understanding       Stress II: Relaxation: -Discuss relaxation techniques to limit stress. Flowsheet Row PULMONARY REHAB OTHER RESPIRATORY from 03/21/2023 in [place] [facility]  Date 02/21/23  Educator [facility]  Instruction Review Code 1- Verbalizes Understanding       Oxygen for Home/Travel: - Discuss how to prepare for travel when on oxygen and proper ways to transport and store oxygen to ensure safety.   Knowledge Questionnaire Score:  Knowledge Questionnaire Score - 01/10/23 0907       Knowledge Questionnaire Score   Pre Score 9/18             Core Components/Risk Factors/Patient Goals at Admission:  Personal Goals and Risk Factors at Admission - 01/05/23 1344       Core Components/Risk Factors/Patient Goals on Admission    Weight Management Yes    Improve shortness of breath with ADL's  Yes    Intervention Provide education, individualized exercise plan and daily activity instruction to help decrease symptoms of SOB with activities of daily living.    Expected Outcomes Short Term: Improve cardiorespiratory fitness to achieve a reduction of symptoms when performing ADLs;Long Term: Be able to perform more ADLs without symptoms or delay the onset of symptoms    Hypertension Yes    Intervention Provide education on lifestyle modifcations including regular physical activity/exercise, weight management, moderate sodium restriction and increased consumption of fresh fruit, vegetables, and low fat dairy, alcohol moderation, and smoking cessation.;Monitor prescription use compliance.     Expected Outcomes Short Term: Continued assessment and intervention until BP is < 140/66mm HG in hypertensive participants. < 130/58mm HG in hypertensive participants with diabetes, heart failure or chronic kidney disease.;Long Term: Maintenance of blood pressure at goal levels.    Lipids Yes    Intervention Provide education and support for participant on nutrition & aerobic/resistive exercise along with prescribed medications to achieve LDL 70mg , HDL >40mg .    Expected Outcomes Short Term: Participant states understanding of desired cholesterol values and is compliant with medications prescribed. Participant is following exercise prescription and nutrition guidelines.;Long Term: Cholesterol controlled with medications as prescribed, with individualized exercise RX and with personalized nutrition plan. Value goals: LDL < 70mg , HDL > 40 mg.             Core Components/Risk Factors/Patient Goals Review:   Goals and Risk Factor Review     Row Name 01/29/23 1040 02/14/23 1013 03/16/23 0939         Core Components/Risk Factors/Patient Goals Review   Personal Goals Review Weight Management/Obesity;Hypertension;Improve shortness of breath with ADL's Weight Management/Obesity;Hypertension;Improve shortness of breath with ADL's Weight Management/Obesity;Hypertension;Improve shortness of breath with ADL's     Review Mackson is doing well in rehab.  His weight is starting to come down. He would like to lose more and get back to his 224.  His pressures are doing well, but on the lower side.  He has appointment this week and is taking in his BP report to his doctor to see if they may back off on his medication some for blood pressures.  His breathing is good unless he is carrying something or climbing stairs.  We talked about making sure to use PLB and not holding his breathing when he does this. Asencion is doing well in rehab.  His weight has been holding steady despite more exercise and diet changes.  He is  frustrated by this but was encouraged to stick with it longer.  His pressures are doing well.  He is doing well with his meds.  His breathing has improved other than while weighted and going up stairs still.  He was encouraged to conitnue to work on this. Johns weight has been steady. He has been walking and eating helathier to work on weight maintance and loss. His pressure is doing wel and his breathing has improved. He has seen slight improvemnt when walking up stairs.     Expected Outcomes Short: Continue to work on PLB when carrying stuff Long: Continue to work on [employer] loss. Short: Contineu to work on carrying things Long: Conitnue to monitor risk factors Short: Contineu to work on carrying things Long: Conitnue to monitor risk factors              Core Components/Risk Factors/Patient Goals at Discharge (Final Review):   Goals and Risk Factor Review - 03/16/23 [phone]  Core Components/Risk Factors/Patient Goals Review   Personal Goals Review Weight Management/Obesity;Hypertension;Improve shortness of breath with ADL's    Review Johns weight has been steady. He has been walking and eating helathier to work on weight maintance and loss. His pressure is doing wel and his breathing has improved. He has seen slight improvemnt when walking up stairs.    Expected Outcomes Short: Contineu to work on carrying things Long: Conitnue to monitor risk factors             ITP Comments:  ITP Comments     Row Name 02/07/23 0651 02/26/23 0940 03/07/23 0652 04/04/23 0813 05/01/23 0737   ITP Comments 30 day review completed. ITP sent to Dr.Jehanzeb Memon, Medical Director of  Pulmonary Rehab. Continue with ITP unless changes are made by physician. Briley noted his BP dropped at home on Friday and he called his doctor.  He is currently on a BP med holiday to see how it does.  He does not feel dizzy today like Friday afternoon. 30 day review completed. ITP sent to Dr.Jehanzeb Memon, Medical Director of   Pulmonary Rehab. Continue with ITP unless changes are made by physician. 30 day review completed. ITP sent to Dr.Jehanzeb Memon, Medical Director of  Pulmonary Rehab. Continue with ITP unless changes are made by physician. 30 day review completed. ITP sent to Dr.Jehanzeb Memon, Medical Director of  Pulmonary Rehab. Continue with ITP unless changes are made by physician. Pt has not attended since 04/02/23 due to back pain and was supposed to see ortho, but has not returned our calls since then.  Currently getting outpt PT now.    Row Name 05/30/23 0835           ITP Comments 30 day review completed. ITP sent to Dr.Jehanzeb Memon, Medical Director of  Pulmonary Rehab. Continue with ITP unless changes are made by physician.  Pt still has not returned to rehab with doing PT.  He was supposed to return this week.  No goals assessed on this round due to non attendance.                Comments: 30 day review

## 2023-06-01 ENCOUNTER — Encounter (HOSPITAL_COMMUNITY): Payer: 59

## 2023-06-04 ENCOUNTER — Encounter (HOSPITAL_COMMUNITY): Payer: 59

## 2023-06-05 ENCOUNTER — Other Ambulatory Visit: Payer: Self-pay | Admitting: Internal Medicine

## 2023-06-05 DIAGNOSIS — E785 Hyperlipidemia, unspecified: Secondary | ICD-10-CM

## 2023-06-11 ENCOUNTER — Encounter (HOSPITAL_COMMUNITY): Payer: 59

## 2023-06-18 ENCOUNTER — Encounter (HOSPITAL_COMMUNITY): Payer: 59

## 2023-06-21 ENCOUNTER — Telehealth (HOSPITAL_COMMUNITY): Payer: Self-pay

## 2023-06-21 NOTE — Telephone Encounter (Signed)
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

## 2023-06-22 ENCOUNTER — Encounter (HOSPITAL_COMMUNITY): Payer: Self-pay | Admitting: *Deleted

## 2023-06-22 DIAGNOSIS — R0600 Dyspnea, unspecified: Secondary | ICD-10-CM

## 2023-06-22 NOTE — Progress Notes (Signed)
Pulmonary Individual Treatment Plan  Patient Details  Name: Chad Berry MRN: [id number] Date of Birth: 21-Oct-1940 Referring Provider:   Flowsheet Row PULMONARY REHAB OTHER RESP ORIENTATION from 01/10/2023 in [facility]  Referring Provider Dr. Vassie Loll       Initial Encounter Date:  Flowsheet Row PULMONARY REHAB OTHER RESP ORIENTATION from 01/10/2023 in [facility]  Date 01/10/23       Visit Diagnosis: Dyspnea, unspecified type  Patient's Home Medications on Admission:   Current Outpatient Medications:    ALPRAZolam (XANAX) 0.5 MG tablet, Take 0.5 mg by mouth at bedtime as needed. (Patient not taking: Reported on 01/05/2023), Disp: , Rfl:    amoxicillin (AMOXIL) 500 MG capsule, Take 4 capsules (2,000mg ) by mouth 30-60 minutes prior to dental work or cleaning., Disp: 4 capsule, Rfl: 3   Ascorbic Acid (VITAMIN C) 1000 MG tablet, Take 1,000 mg by mouth daily. (Patient not taking: Reported on 01/05/2023), Disp: , Rfl:    Cholecalciferol (VITAMIN D) 50 MCG (2000 UT) tablet, Take 2,000 Units by mouth daily., Disp: , Rfl:    Evolocumab (REPATHA SURECLICK) 140 MG/ML SOAJ, INJECT 140MG  SUBCUTANEOUSLY EVERY 14 DAYS, Disp: 2 mL, Rfl: 11   ezetimibe (ZETIA) 10 MG tablet, Take 1 tablet (10 mg total) by mouth daily., Disp: 90 tablet, Rfl: 3   famotidine (PEPCID) 40 MG tablet, Take 1 tablet (40 mg total) by mouth at bedtime., Disp: 30 tablet, Rfl: 1   levothyroxine (SYNTHROID) 75 MCG tablet, Take 75 mcg by mouth at bedtime., Disp: , Rfl:    losartan (COZAAR) 50 MG tablet, Take 50 mg by mouth daily., Disp: , Rfl:    tamsulosin (FLOMAX) 0.4 MG CAPS capsule, Take 0.4 mg by mouth at bedtime., Disp: , Rfl:    vitamin B-12 (CYANOCOBALAMIN) 100 MCG tablet, Take 100 mcg by mouth daily., Disp: , Rfl:   Past Medical History: Past Medical History:  Diagnosis Date   Aortic atherosclerosis (HCC)    Bradycardia    while on BB with HR to the 40s   Carotid stenosis     CAROTID DOPPLER, 12/07/2011 - Mild arthrosclerotic changes, no high-grade stenosis   Cataract    rt. eye   Clostridium difficile infection 05/2019   Diverticulitis    Heart murmur    History of echocardiogram    Echo 9/16:  EF 60-65%, no RWMA, Gr 1 DD, holosystolic MVP of posterior leaflet, mild MR, LA upper limits of normal, normal RVF, mod TR, PASP 50 mmHg   Hypercholesteremia    Hyperlipidemia    Hypertension    Hypothyroidism    MVP (mitral valve prolapse)    a. Echo 12/07/2011 - EF-65-70%, severe mitral regurgitation, moderate-severe tricuspid regurgitation, moderate pulmonary HTN, moderate pulmonic regurgitation;  b. 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3  47.5  43.6  45.5   Bicarbonate 20.0 - 28.0 mmol/L 24.7  25.7  25.5  23.6  19.1  21.1  22.8  24.0  26.9  24.8  25.3  24.7   TCO2 22 - 32 mmol/L 26  27  27   - 20  23  24  22  25  24  28  26  26  27  23  26  26    Acid-base deficit  0.0 - 2.0 mmol/L 1.0  1.0  0.8  5.0  6.0  3.0  1.0  1.0   O2 Saturation % 95.0  77.0  76.0  97.9  99.0  94.0  99.0  100.0  100.0  87.0  100.0  100.0     Details       Multiple values from one day are sorted in reverse-chronological order         Capillary Blood Glucose: Lab Results  Component Value Date   GLUCAP 157 (H) 09/28/2020   GLUCAP 127 (H) 09/24/2020   GLUCAP 124 (H) 09/24/2020   GLUCAP 114 (H) 09/24/2020   GLUCAP 125 (H) 09/24/2020     Pulmonary Assessment Scores:  Pulmonary Assessment Scores     Row Name 01/10/23 0910         ADL UCSD   ADL Phase Entry     SOB Score total 7     Rest 0     Walk 0     Stairs 4     Bath 0     Dress 0     Shop 0       CAT Score   CAT Score 4       mMRC Score   mMRC Score 1             UCSD: Self-administered rating of dyspnea associated with activities of daily living (ADLs) 6-point scale (0 = "not at all" to 5 = "maximal or unable to do because of breathlessness")  Scoring Scores range from 0 to 120.  Minimally important difference is 5 units  CAT: CAT can identify the health impairment of COPD patients and is better correlated with disease progression.  CAT has a scoring range of zero to 40. The CAT score is classified into four groups of low (less than 10), medium (10 - 20), high (21-30) and very high (31-40) based on the impact level of disease on health status. A CAT score over 10 suggests significant symptoms.  A worsening CAT score could be explained by an exacerbation, poor medication adherence, poor inhaler technique, or progression of COPD or comorbid conditions.  CAT MCID is 2 points  mMRC: mMRC (Modified Medical Research Council) Dyspnea Scale is used to assess the degree of baseline functional disability in patients of respiratory disease due to dyspnea. No minimal important difference is established. A decrease in score of 1 point or greater is considered a positive change.   Pulmonary Function  Assessment:   Exercise Target Goals: Exercise Program Goal: Individual exercise  prescription set using results from initial 6 min walk test and THRR while considering  patient's activity barriers and safety.   Exercise Prescription Goal: Initial exercise prescription builds to 30-45 minutes a day of aerobic activity, 2-3 days per week.  Home exercise guidelines will be given to patient during program as part of exercise prescription that the participant will acknowledge.  Activity Barriers & Risk Stratification:  Activity Barriers & Cardiac Risk Stratification - 01/05/23 1318       Activity Barriers & Cardiac Risk Stratification   Activity Barriers Shortness of Breath    Cardiac Risk Stratification Moderate             6 Minute Walk:  6 Minute Walk     Row Name 01/10/23 1001         6 Minute Walk   Phase Initial     Distance 1455 feet     Walk Time 6 minutes     # of Rest Breaks 0     MPH 2.75     METS 5.25     RPE 9     Perceived Dyspnea  0     VO2 Peak 18.38     Symptoms No     Resting HR 65 bpm     Resting BP 140/80     Resting Oxygen Saturation  96 %     Exercise Oxygen Saturation  during 6 min walk 95 %     Max Ex. HR 94 bpm     Max Ex. BP 146/80     2 Minute Post BP 140/78       Interval HR   1 Minute HR 84     2 Minute HR 87     3 Minute HR 86     4 Minute HR 90     5 Minute HR 92     6 Minute HR 94     2 Minute Post HR 76     Interval Heart Rate? Yes       Interval Oxygen   Interval Oxygen? Yes     Baseline Oxygen Saturation % 96 %     1 Minute Oxygen Saturation % 98 %     1 Minute Liters of Oxygen 0 L     2 Minute Oxygen Saturation % 95 %     2 Minute Liters of Oxygen 0 L     3 Minute Oxygen Saturation % 96 %     3 Minute Liters of Oxygen 0 L     4 Minute Oxygen Saturation % 96 %     4 Minute Liters of Oxygen 0 L     5 Minute Oxygen Saturation % 96 %     5 Minute Liters of Oxygen 0 L     6 Minute Oxygen Saturation % 97 %     6 Minute  Liters of Oxygen 0 L     2 Minute Post Oxygen Saturation % 97 %     2 Minute Post Liters of Oxygen 0 L              Oxygen Initial Assessment:  Oxygen Initial Assessment - 01/10/23 0911       Home Oxygen   Home Oxygen Device None    Sleep Oxygen Prescription None    Home Exercise Oxygen Prescription None    Home Resting Oxygen Prescription None      Initial 6 min Walk   Oxygen Used None  Program Oxygen Prescription   Program Oxygen Prescription None      Intervention   Short Term Goals To learn and exhibit compliance with exercise, home and travel O2 prescription;To learn and understand importance of monitoring SPO2 with pulse oximeter and demonstrate accurate use of the pulse oximeter.;To learn and understand importance of maintaining oxygen saturations>88%;To learn and demonstrate proper pursed lip breathing techniques or other breathing techniques. ;To learn and demonstrate proper use of respiratory medications    Long  Term Goals Exhibits compliance with exercise, home  and travel O2 prescription;Verbalizes importance of monitoring SPO2 with pulse oximeter and return demonstration;Maintenance of O2 saturations>88%;Exhibits proper breathing techniques, such as pursed lip breathing or other method taught during program session;Compliance with respiratory medication;Demonstrates proper use of MDI's             Oxygen Re-Evaluation:  Oxygen Re-Evaluation     Row Name 01/29/23 1044 02/14/23 1014 03/16/23 0940         Program Oxygen Prescription   Program Oxygen Prescription None None None       Home Oxygen   Home Oxygen Device None None None     Sleep Oxygen Prescription None None None     Home Exercise Oxygen Prescription None None None     Home Resting Oxygen Prescription None None None       Goals/Expected Outcomes   Short Term Goals To learn and understand importance of monitoring SPO2 with pulse oximeter and demonstrate accurate use of the pulse  oximeter.;To learn and understand importance of maintaining oxygen saturations>88%;To learn and demonstrate proper pursed lip breathing techniques or other breathing techniques.  To learn and understand importance of monitoring SPO2 with pulse oximeter and demonstrate accurate use of the pulse oximeter.;To learn and understand importance of maintaining oxygen saturations>88%;To learn and demonstrate proper pursed lip breathing techniques or other breathing techniques.  To learn and understand importance of monitoring SPO2 with pulse oximeter and demonstrate accurate use of the pulse oximeter.;To learn and understand importance of maintaining oxygen saturations>88%;To learn and demonstrate proper pursed lip breathing techniques or other breathing techniques.      Long  Term Goals Verbalizes importance of monitoring SPO2 with pulse oximeter and return demonstration;Maintenance of O2 saturations>88%;Exhibits proper breathing techniques, such as pursed lip breathing or other method taught during program session Verbalizes importance of monitoring SPO2 with pulse oximeter and return demonstration;Maintenance of O2 saturations>88%;Exhibits proper breathing techniques, such as pursed lip breathing or other method taught during program session Verbalizes importance of monitoring SPO2 with pulse oximeter and return demonstration;Maintenance of O2 saturations>88%;Exhibits proper breathing techniques, such as pursed lip breathing or other method taught during program session     Comments Chad Berry is doing well in rehab. He is breathing better walking, but still getting SOB with bending, carrying, and walking up stairs.  We talked about making sure to use his PLB for all three and to also make sure that he is not holding his breath when he does these. He has been working on his PLB and will continue to do so.  He is going to try not to hold his breath when he is carrying things to see if it improves. Chad Berry is doing well in rehab.   He is doing well with his PLB and his SOB has improved.  They only time he is SOB is while carrying things or going upstairs.  His saturations are doing well. Chad Berry is doing well in rehab. He is working on PLB. He has noticed  a slight increase in his breathing when taking in groceries the other day, he did not need to stop to take breaks when walking up his stairs.     Goals/Expected Outcomes Short: Make sure to not hold breath while carrying items Long: Continue to improve SOB and use PLB Short: Continue to use PLB even while carrying things Long: Continue to improve SOB Short: Continue to use PLB even while carrying things Long: Continue to improve SOB              Oxygen Discharge (Final Oxygen Re-Evaluation):  Oxygen Re-Evaluation - 03/16/23 0940       Program Oxygen Prescription   Program Oxygen Prescription None      Home Oxygen   Home Oxygen Device None    Sleep Oxygen Prescription None    Home Exercise Oxygen Prescription None    Home Resting Oxygen Prescription None      Goals/Expected Outcomes   Short Term Goals To learn and understand importance of monitoring SPO2 with pulse oximeter and demonstrate accurate use of the pulse oximeter.;To learn and understand importance of maintaining oxygen saturations>88%;To learn and demonstrate proper pursed lip breathing techniques or other breathing techniques.     Long  Term Goals Verbalizes importance of monitoring SPO2 with pulse oximeter and return demonstration;Maintenance of O2 saturations>88%;Exhibits proper breathing techniques, such as pursed lip breathing or other method taught during program session    Comments Chad Berry is doing well in rehab. He is working on PLB. He has noticed a slight increase in his breathing when taking in groceries the other day, he did not need to stop to take breaks when walking up his stairs.    Goals/Expected Outcomes Short: Continue to use PLB even while carrying things Long: Continue to improve SOB              Initial Exercise Prescription:  Initial Exercise Prescription - 01/10/23 1000       Date of Initial Exercise RX and Referring Provider   Date 01/10/23    Referring Provider Dr. Vassie Loll      Oxygen   Maintain Oxygen Saturation 88% or higher      Treadmill   MPH 2.5    Grade 0.5    Minutes 15      REL-XR   Level 3    Speed 60    Minutes 15      Prescription Details   Frequency (times per week) 3    Duration Progress to 30 minutes of continuous aerobic without signs/symptoms of physical distress      Intensity   THRR 40-80% of Max Heartrate 95-125    Ratings of Perceived Exertion 11-13    Perceived Dyspnea 0-4      Resistance Training   Training Prescription Yes    Weight 4    Reps 10-15             Perform Capillary Blood Glucose checks as needed.  Exercise Prescription Changes:   Exercise Prescription Changes     Row Name 01/22/23 0907 01/29/23 1000 03/12/23 0800 03/19/23 1500 04/02/23 1200     Response to Exercise   Blood Pressure (Admit) 110/60 -- 124/62 120/64 120/60   Blood Pressure (Exercise) 142/62 -- -- -- --   Blood Pressure (Exit) 116/78 -- 120/60 104/66 120/64   Heart Rate (Admit) 70 bpm -- 66 bpm 70 bpm 74 bpm   Heart Rate (Exercise) 97 bpm -- 97 bpm 90 bpm 88 bpm   Heart  Rate (Exit) 75 bpm -- 73 bpm 78 bpm 82 bpm   Oxygen Saturation (Admit) 95 % -- 96 % 96 % 95 %   Oxygen Saturation (Exercise) 97 % -- 97 % 97 % 96 %   Oxygen Saturation (Exit) 96 % -- 94 % 96 % 96 %   Rating of Perceived Exertion (Exercise) 12 -- 13 12 12    Perceived Dyspnea (Exercise) 0 -- 0 1 0   Duration Continue with 30 min of aerobic exercise without signs/symptoms of physical distress. -- Continue with 30 min of aerobic exercise without signs/symptoms of physical distress. Continue with 30 min of aerobic exercise without signs/symptoms of physical distress. Continue with 30 min of aerobic exercise without signs/symptoms of physical distress.   Intensity THRR  unchanged -- THRR unchanged THRR unchanged THRR unchanged     Progression   Progression Continue to progress workloads to maintain intensity without signs/symptoms of physical distress. -- Continue to progress workloads to maintain intensity without signs/symptoms of physical distress. Continue to progress workloads to maintain intensity without signs/symptoms of physical distress. Continue to progress workloads to maintain intensity without signs/symptoms of physical distress.     Resistance Training   Training Prescription Yes -- Yes Yes Yes   Weight 4 lbs -- 8 lbs 8lbs 8lbs   Reps 10-15 -- 10-15 10-15 10-15     Treadmill   MPH 3.1 -- 3.3 2.7 2.6   Grade 2.5 -- 2 2 2    Minutes 15 -- 15 15 15    METs 4.44 -- 4.43 3.81 3.71     Bike   Level 6 -- 6 6 --   Watts 62 -- 81 78 --   Minutes 15 -- 15 15 --   METs 3.3 -- 3.4 3.1 --     Home Exercise Plan   Plans to continue exercise at -- Home (comment)  walking Home (comment) Home (comment) --   Frequency -- Add 3 additional days to program exercise sessions. Add 3 additional days to program exercise sessions. Add 3 additional days to program exercise sessions. --   Initial Home Exercises Provided -- 01/29/23 -- -- --     Oxygen   Maintain Oxygen Saturation 88% or higher -- 88% or higher 88% or higher --            Exercise Comments:   Exercise Comments     Row Name 01/10/23 1005           Exercise Comments Chad Berry completed his walk test today for starting rehab. He went 1455 ft and did great. He will be exercising on treadmill and elliptical.                Exercise Goals and Review:   Exercise Goals     Row Name 01/10/23 1005             Exercise Goals   Increase Physical Activity Yes       Intervention Provide advice, education, support and counseling about physical activity/exercise needs.;Develop an individualized exercise prescription for aerobic and resistive training based on initial evaluation findings,  risk stratification, comorbidities and participant's personal goals.       Expected Outcomes Short Term: Attend rehab on a regular basis to increase amount of physical activity.;Long Term: Exercising regularly at least 3-5 days a week.;Long Term: Add in home exercise to make exercise part of routine and to increase amount of physical activity.       Increase Strength and Stamina Yes  Intervention Provide advice, education, support and counseling about physical activity/exercise needs.;Develop an individualized exercise prescription for aerobic and resistive training based on initial evaluation findings, risk stratification, comorbidities and participant's personal goals.       Expected Outcomes Short Term: Perform resistance training exercises routinely during rehab and add in resistance training at home;Short Term: Increase workloads from initial exercise prescription for resistance, speed, and METs.;Long Term: Improve cardiorespiratory fitness, muscular endurance and strength as measured by increased METs and functional capacity ( )       Able to understand and use rate of perceived exertion (RPE) scale Yes       Intervention Provide education and explanation on how to use RPE scale       Expected Outcomes Short Term: Able to use RPE daily in rehab to express subjective intensity level;Long Term:  Able to use RPE to guide intensity level when exercising independently       Able to understand and use Dyspnea scale Yes       Intervention Provide education and explanation on how to use Dyspnea scale       Expected Outcomes Short Term: Able to use Dyspnea scale daily in rehab to express subjective sense of shortness of breath during exertion;Long Term: Able to use Dyspnea scale to guide intensity level when exercising independently       Knowledge and understanding of Target Heart Rate Range (THRR) Yes       Intervention Provide education and explanation of THRR including how the numbers were  predicted and where they are located for reference       Expected Outcomes Long Term: Able to use THRR to govern intensity when exercising independently;Short Term: Able to state/look up THRR;Short Term: Able to use daily as guideline for intensity in rehab       Able to check pulse independently Yes       Intervention Provide education and demonstration on how to check pulse in carotid and radial arteries.;Review the importance of being able to check your own pulse for safety during independent exercise       Expected Outcomes Short Term: Able to explain why pulse checking is important during independent exercise;Long Term: Able to check pulse independently and accurately       Understanding of Exercise Prescription Yes       Intervention Provide education, explanation, and written materials on patient's individual exercise prescription       Expected Outcomes Short Term: Able to explain program exercise prescription;Long Term: Able to explain home exercise prescription to exercise independently                Exercise Goals Re-Evaluation :  Exercise Goals Re-Evaluation     Row Name 01/23/23 0909 01/29/23 0957 02/14/23 1004 03/14/23 0802 03/16/23 0919     Exercise Goal Re-Evaluation   Exercise Goals Review Increase Physical Activity;Increase Strength and Stamina;Understanding of Exercise Prescription Increase Physical Activity;Increase Strength and Stamina;Able to understand and use rate of perceived exertion (RPE) scale;Able to understand and use Dyspnea scale;Able to check pulse independently;Knowledge and understanding of Target Heart Rate Range (THRR);Understanding of Exercise Prescription Increase Physical Activity;Increase Strength and Stamina;Understanding of Exercise Prescription Increase Physical Activity;Able to understand and use rate of perceived exertion (RPE) scale;Understanding of Exercise Prescription Increase Physical Activity;Increase Strength and Stamina;Understanding of  Exercise Prescription   Comments Chad Berry has been doing great in rehab. He has been increasing his workloads each week. He is currently exercising at level 6 on the bike and  walking at 3.1 speed with a 2.5 grade on the treadmill. Will continue to monitor and progress as able. [place].  Reviewed home exercise with pt today.  Pt plans to walk with dogs at home for exercise.  Reviewed THR, pulse, RPE, sign and symptoms, pulse oximetery and when to call 911 or MD.  Also discussed weather considerations and indoor options.  Pt voiced understanding. Chad Berry is doing well in rehab. He is walking on his off days.  He feels like his stamina is improving. He still has a hard time walking upstairs and carrying a load.  We will continue to monitor his progress. Chad Berry is doing well with exercise. [place]. He is exercise at a MET on 4.43 on the treadmill. He is walking at a speed of 3.3 and an incline of 2.0. Will continue to monitor and progress as able. Chad Berry is doing well in rehab. [place]. He is still having trouble with doing stairs and inclines but has noticed a slight improvment when taking in his groceries the other day. He is increasing workloads on both the bike and treadmill.   Expected Outcomes Short term: continue to exercise at current workloads    long term: continue to attend rehab Short: Continue to add in walking at home Long: Conitnue to improve breathing. Short: Trying walking while carrying weights to load Long; Conitnue to improve stamina Short: increase grade or walk with weights long term: continue to attend rehab Short: increase grade or walk with weights long term: continue to attend rehab    Row Name 03/19/23 1556             Exercise Goal Re-Evaluation   Exercise Goals Review Increase Physical Activity;Increase Strength and Stamina;Understanding of Exercise Prescription        Comments Chad Berry is tolerating exericse well. He is increasing his speed and grade on the treadmill each class. He is exericsing at 3.81 METs on the treadmill. Will continue to monitor and progress as aable.       Expected Outcomes Short: increase grade or walk with weights long term: continue to attend rehab                Discharge Exercise Prescription (Final Exercise Prescription Changes):  Exercise Prescription Changes - 04/02/23 1200       Response to Exercise   Blood Pressure (Admit) 120/60    Blood Pressure (Exit) 120/64    Heart Rate (Admit) 74 bpm    Heart Rate (Exercise) 88 bpm    Heart Rate (Exit) 82 bpm    Oxygen Saturation (Admit) 95 %    Oxygen Saturation (Exercise) 96 %    Oxygen Saturation (Exit) 96 %    Rating of Perceived Exertion (Exercise) 12    Perceived Dyspnea (Exercise) 0    Duration Continue with 30 min of aerobic exercise without signs/symptoms of physical distress.    Intensity THRR unchanged      Progression   Progression Continue to progress workloads to maintain intensity without signs/symptoms of physical distress.      Resistance Training   Training Prescription Yes    Weight 8lbs    Reps 10-15      Treadmill   MPH 2.6    Grade 2    Minutes 15    METs 3.71  Nutrition:  Target Goals: Understanding of nutrition guidelines, daily intake of sodium 1500mg , cholesterol 200mg , calories 30% from fat and 7% or less from saturated fats, daily to have 5 or more servings of fruits and vegetables.  Biometrics:  Pre Biometrics - 01/10/23 1007       Pre Biometrics   Height 5\' 10"  (1.778 m)    Weight 229 lb 0.9 oz (103.9 kg)    Waist Circumference 45 inches    Hip Circumference 42 inches    Waist to Hip Ratio 1.07 %    BMI (Calculated) 32.87    Grip Strength 29.4 kg    Single Leg Stand 60 seconds              Nutrition Therapy Plan and Nutrition Goals:   Nutrition Assessments:  MEDIFICTS Score Key: >=70 Need to  make dietary changes  40-70 Heart Healthy Diet <= 40 Therapeutic Level Cholesterol Diet  Flowsheet Row PULMONARY REHAB OTHER RESP ORIENTATION from 01/10/2023 in [facility] CARDIAC REH[facility]ILITATION  Picture Your Plate Total Score on Admission 36      Picture Your Plate Scores: <08 Unhealthy dietary pattern with much room for improvement. 41-50 Dietary pattern unlikely to meet recommendations for good health and room for improvement. 51-60 More healthful dietary pattern, with some room for improvement.  >60 Healthy dietary pattern, although there may be some specific behaviors that could be improved.    Nutrition Goals Re-Evaluation:  Nutrition Goals Re-Evaluation     Row Name 01/29/23 1043 02/14/23 1010 03/16/23 0931         Goals   Nutrition Goal Healthy Eating Plan Short: Continue to add in more fruit and vegetables Long: Continue to focus on healthy eatin g Healthy eating     Comment Chad Berry is doing well in rehab.  He is not follow any particular diet, but he is trying to eat healthier. He is getting a lot of fruits and vegetables.  He has also started to cut back on his sweets.  He is getting enough protein and works on portion control. Chad Berry is doing well in rehab.  He has been working on his diet.  He really is working on portion control and frustrated that he is not losing weight.  He does eat fruits and vegetables.  He continues to try to limit his sweets. Chad Berry is doing well with his eating. He stated that he is trying to eat more veggies and is working on portion control. He was wanting to lose weight and has been working on eating healthier. He has been limiting his sweets.     Expected Outcome Short: Continue to add in more fruit and vegetables Long: Continue to focus on healthy eatin g Short: Conitnue to work on portion size and stick to diet through plateau Long: COnitnue to eat healthy Short: continue to work on portion control. Long term: continue to eat healthy               Nutrition Goals Discharge (Final Nutrition Goals Re-Evaluation):  Nutrition Goals Re-Evaluation - 03/16/23 0931       Goals   Nutrition Goal Healthy eating    Comment Chad Berry is doing well with his eating. He stated that he is trying to eat more veggies and is working on portion control. He was wanting to lose weight and has been working on eating healthier. He has been limiting his sweets.    Expected Outcome Short: continue to work on portion control. Long term: continue to  eat healthy             Psychosocial: Target Goals: Acknowledge presence or absence of significant depression and/or stress, maximize coping skills, provide positive support system. Participant is able to verbalize types and ability to use techniques and skills needed for reducing stress and depression.  Initial Review & Psychosocial Screening:  Initial Psych Review & Screening - 01/05/23 1345       Initial Review   Current issues with None Identified      Family Dynamics   Good Support System? Yes      Barriers   Psychosocial barriers to participate in program There are no identifiable barriers or psychosocial needs.      Screening Interventions   Interventions Encouraged to exercise;To provide support and resources with identified psychosocial needs;Provide feedback about the scores to participant    Expected Outcomes Short Term goal: Utilizing psychosocial counselor, staff and physician to assist with identification of specific Stressors or current issues interfering with healing process. Setting desired goal for each stressor or current issue identified.;Short Term goal: Identification and review with participant of any Quality of Life or Depression concerns found by scoring the questionnaire.;Long Term Goal: Stressors or current issues are controlled or eliminated.;Long Term goal: The participant improves quality of Life and PHQ9 Scores as seen by post scores and/or verbalization of changes              Quality of Life Scores:  Scores of 19 and below usually indicate a poorer quality of life in these areas.  A difference of  2-3 points is a clinically meaningful difference.  A difference of 2-3 points in the total score of the Quality of Life Index has been associated with significant improvement in overall quality of life, self-image, physical symptoms, and general health in studies assessing change in quality of life.   PHQ-9: Review Flowsheet       01/10/2023  Depression screen PHQ 2/9  Decreased Interest 0  Down, Depressed, Hopeless 0  PHQ - 2 Score 0  Altered sleeping 0  Tired, decreased energy 2  Change in appetite 0  Feeling bad or failure about yourself  0  Trouble concentrating 0  Moving slowly or fidgety/restless 0  Suicidal thoughts 0  PHQ-9 Score 2  Difficult doing work/chores Not difficult at all   Interpretation of Total Score  Total Score Depression Severity:  1-4 = Minimal depression, 5-9 = Mild depression, 10-14 = Moderate depression, 15-19 = Moderately severe depression, 20-27 = Severe depression   Psychosocial Evaluation and Intervention:  Psychosocial Evaluation - 01/05/23 1348       Psychosocial Evaluation & Interventions   Interventions Stress management education;Relaxation education;Encouraged to exercise with the program and follow exercise prescription    Comments Patient referred to PR with Dyspnea. 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 He says he has been SOB since but it is intermittent. 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 He also enjoys fishing off shore and hunting. He denies any depression, anxiety, or stressors. He says he sleeps good. He lives with his wife who is his main support person and they have a son and  a daughter who also support him. They have 4 grandchildren. He had his mitral valve replaced  a few years ago and participated in cardiac rehab at [facility] in [place] so he is familiar with the program. His goals for the program are to lose weight and imrpove his SOB. He would like to lose the 20 lbs he gained since his valve replacement surgery.    Expected Outcomes Short Term goal: Start the program and attend consistently. Long term: Lose weight and improve his SOB.    Continue Psychosocial Services  Follow up required by staff             Psychosocial Re-Evaluation:  Psychosocial Re-Evaluation     Row Name 01/29/23 1030 02/14/23 1006 03/16/23 0924         Psychosocial Re-Evaluation   Current issues with Current Stress Concerns Current Stress Concerns Current Stress Concerns     Comments Chad Berry is doing well in rehab.  He denies any major stressors other than his breathing is really not improving too much.  He would like to see a bigger improvement.  He says he can walk, but when he bends over or tries to carry things he is still SOB.  We talked about how bending decreases plueral space and inhibits breathing.  We also talked about being more mindful of not holding his breath when he carries things as most people do.  He was encouraged to continue to use his PLB to help.  He is sleeping well and staying active on the farm. Chad Berry is doing well in rehab.  He denies any major stressors.  He still has difficulty going up stairs especially while loaded.  He just finds this frustrating as is limits what he is able to do.  He continues to sleep well and takes one trip to bathroom around 4am but able to get back to sleep easily. Chad Berry is doing well in rehab. He denies any major stressors excepts getting older. He is still having some difficulty with stairs and SOB but has noticed some improvment. He continues to sleep well and maybe gets up through the night to use the bathroom.     Expected Outcomes  Short: Conitnue to use PLB and try not to do breath holding when carrying things.  Long: conitnue to stay active for mental boost. Short: Conitnue to work on carrying things Long: continue to exercise Short: Conitnue to work on carrying things Long: continue to exercise     Interventions Encouraged to attend Pulmonary Rehabilitation for the exercise Encouraged to attend Pulmonary Rehabilitation for the exercise Encouraged to attend Pulmonary Rehabilitation for the exercise;Stress management education;Relaxation education     Continue Psychosocial Services  Follow up required by staff Follow up required by staff Follow up required by staff              Psychosocial Discharge (Final Psychosocial Re-Evaluation):  Psychosocial Re-Evaluation - 03/16/23 0924       Psychosocial Re-Evaluation   Current issues with Current Stress Concerns    Comments Chad Berry is doing well in rehab. He denies any major stressors excepts getting older. He is still having some difficulty with stairs and SOB but has noticed some improvment. He continues to sleep well and maybe gets up through the night to use the bathroom.    Expected Outcomes Short: Conitnue to work on carrying things Long: continue to exercise    Interventions Encouraged to attend Pulmonary Rehabilitation for the exercise;Stress management education;Relaxation education    Continue Psychosocial Services  Follow up required by staff  Education: Education Goals: Education classes will be provided on a weekly basis, covering required topics. Participant will state understanding/return demonstration of topics presented.  Learning Barriers/Preferences:  Learning Barriers/Preferences - 01/05/23 1343       Learning Barriers/Preferences   Learning Barriers None    Learning Preferences Skilled Demonstration             Education Topics: How Lungs Work and Diseases: - Discuss the anatomy of the lungs and diseases that can affect  the lungs, such as COPD. Flowsheet Row PULMONARY REHAB OTHER RESPIRATORY from 03/21/2023 in [facility]  Date 03/14/23  Educator [facility]  Instruction Review Code 1- Verbalizes Understanding       Exercise: -Discuss the importance of exercise, FITT principles of exercise, normal and abnormal responses to exercise, and how to exercise safely.   Environmental Irritants: -Discuss types of environmental irritants and how to limit exposure to environmental irritants.   Meds/Inhalers and oxygen: - Discuss respiratory medications, definition of an inhaler and oxygen, and the proper way to use an inhaler and oxygen.   Energy Saving Techniques: - Discuss methods to conserve energy and decrease shortness of breath when performing activities of daily living.    Bronchial Hygiene / Breathing Techniques: - Discuss breathing mechanics, pursed-lip breathing technique,  proper posture, effective ways to clear airways, and other functional breathing techniques   Cleaning Equipment: - Provides group verbal and written instruction about the health risks of elevated stress, cause of high stress, and healthy ways to reduce stress.   Nutrition I: Fats: - Discuss the types of cholesterol, what cholesterol does to the body, and how cholesterol levels can be controlled. Flowsheet Row PULMONARY REHAB OTHER RESPIRATORY from 03/21/2023 in [facility]  Date 02/15/23  Educator [facility]  Instruction Review Code 1- Verbalizes Understanding       Nutrition II: Labels: -Discuss the different components of food labels and how to read food labels. Flowsheet Row PULMONARY REHAB OTHER RESPIRATORY from 03/21/2023 in [place] [facility]  Date 02/14/23  Educator [facility]  Instruction Review Code 1- Verbalizes Understanding       Respiratory Infections: - Discuss the signs and symptoms of respiratory infections, ways to prevent respiratory infections, and the  importance of seeking medical treatment when having a respiratory infection.   Stress I: Signs and Symptoms: - Discuss the causes of stress, how stress may lead to anxiety and depression, and ways to limit stress. Flowsheet Row PULMONARY REHAB OTHER RESPIRATORY from 03/21/2023 in [place] [facility]  Date 02/21/23  Educator [facility]  Instruction Review Code 1- Verbalizes Understanding       Stress II: Relaxation: -Discuss relaxation techniques to limit stress. Flowsheet Row PULMONARY REHAB OTHER RESPIRATORY from 03/21/2023 in [place] [facility]  Date 02/21/23  Educator [facility]  Instruction Review Code 1- Verbalizes Understanding       Oxygen for Home/Travel: - Discuss how to prepare for travel when on oxygen and proper ways to transport and store oxygen to ensure safety.   Knowledge Questionnaire Score:  Knowledge Questionnaire Score - 01/10/23 0907       Knowledge Questionnaire Score   Pre Score 9/18             Core Components/Risk Factors/Patient Goals at Admission:  Personal Goals and Risk Factors at Admission - 01/05/23 1344       Core Components/Risk Factors/Patient Goals on Admission    Weight Management Yes    Improve shortness of breath with ADL's  Yes    Intervention Provide education, individualized exercise plan and daily activity instruction to help decrease symptoms of SOB with activities of daily living.    Expected Outcomes Short Term: Improve cardiorespiratory fitness to achieve a reduction of symptoms when performing ADLs;Long Term: Be able to perform more ADLs without symptoms or delay the onset of symptoms    Hypertension Yes    Intervention Provide education on lifestyle modifcations including regular physical activity/exercise, weight management, moderate sodium restriction and increased consumption of fresh fruit, vegetables, and low fat dairy, alcohol moderation, and smoking cessation.;Monitor prescription use compliance.     Expected Outcomes Short Term: Continued assessment and intervention until BP is < 140/60mm HG in hypertensive participants. < 130/34mm HG in hypertensive participants with diabetes, heart failure or chronic kidney disease.;Long Term: Maintenance of blood pressure at goal levels.    Lipids Yes    Intervention Provide education and support for participant on nutrition & aerobic/resistive exercise along with prescribed medications to achieve LDL 70mg , HDL >40mg .    Expected Outcomes Short Term: Participant states understanding of desired cholesterol values and is compliant with medications prescribed. Participant is following exercise prescription and nutrition guidelines.;Long Term: Cholesterol controlled with medications as prescribed, with individualized exercise RX and with personalized nutrition plan. Value goals: LDL < 70mg , HDL > 40 mg.             Core Components/Risk Factors/Patient Goals Review:   Goals and Risk Factor Review     Row Name 01/29/23 1040 02/14/23 1013 03/16/23 0939         Core Components/Risk Factors/Patient Goals Review   Personal Goals Review Weight Management/Obesity;Hypertension;Improve shortness of breath with ADL's Weight Management/Obesity;Hypertension;Improve shortness of breath with ADL's Weight Management/Obesity;Hypertension;Improve shortness of breath with ADL's     Review Justino is doing well in rehab.  His weight is starting to come down. He would like to lose more and get back to his 224.  His pressures are doing well, but on the lower side.  He has appointment this week and is taking in his BP report to his doctor to see if they may back off on his medication some for blood pressures.  His breathing is good unless he is carrying something or climbing stairs.  We talked about making sure to use PLB and not holding his breathing when he does this. Wayden is doing well in rehab.  His weight has been holding steady despite more exercise and diet changes.  He is  frustrated by this but was encouraged to stick with it longer.  His pressures are doing well.  He is doing well with his meds.  His breathing has improved other than while weighted and going up stairs still.  He was encouraged to conitnue to work on this. Chad Berry weight has been steady. He has been walking and eating helathier to work on weight maintance and loss. His pressure is doing wel and his breathing has improved. He has seen slight improvemnt when walking up stairs.     Expected Outcomes Short: Continue to work on PLB when carrying stuff Long: Continue to work on [employer] loss. Short: Contineu to work on carrying things Long: Conitnue to monitor risk factors Short: Contineu to work on carrying things Long: Conitnue to monitor risk factors              Core Components/Risk Factors/Patient Goals at Discharge (Final Review):   Goals and Risk Factor Review - 03/16/23 [phone]  Core Components/Risk Factors/Patient Goals Review   Personal Goals Review Weight Management/Obesity;Hypertension;Improve shortness of breath with ADL's    Review Chad Berry weight has been steady. He has been walking and eating helathier to work on weight maintance and loss. His pressure is doing wel and his breathing has improved. He has seen slight improvemnt when walking up stairs.    Expected Outcomes Short: Contineu to work on carrying things Long: Conitnue to monitor risk factors             ITP Comments:  ITP Comments     Row Name 02/07/23 0651 02/26/23 0940 03/07/23 0652 04/04/23 0813 05/01/23 0737   ITP Comments 30 day review completed. ITP sent to Dr.Jehanzeb Memon, Medical Director of  Pulmonary Rehab. Continue with ITP unless changes are made by physician. Chad Berry noted his BP dropped at home on Friday and he called his doctor.  He is currently on a BP med holiday to see how it does.  He does not feel dizzy today like Friday afternoon. 30 day review completed. ITP sent to Dr.Jehanzeb Memon, Medical Director of   Pulmonary Rehab. Continue with ITP unless changes are made by physician. 30 day review completed. ITP sent to Dr.Jehanzeb Memon, Medical Director of  Pulmonary Rehab. Continue with ITP unless changes are made by physician. 30 day review completed. ITP sent to Dr.Jehanzeb Memon, Medical Director of  Pulmonary Rehab. Continue with ITP unless changes are made by physician. Pt has not attended since 04/02/23 due to back pain and was supposed to see ortho, but has not returned our calls since then.  Currently getting outpt PT now.    Row Name 05/30/23 0835 06/21/23 0807         ITP Comments 30 day review completed. ITP sent to Dr.Jehanzeb Memon, Medical Director of  Pulmonary Rehab. Continue with ITP unless changes are made by physician.  Pt still has not returned to rehab with doing PT.  He was supposed to return this week.  No goals assessed on this round due to non attendance. 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  We will discharge him at this time.               Comments: Discharge ITP

## 2023-06-22 NOTE — Progress Notes (Signed)
Discharge Progress Report  Patient Details  Name: Chad Berry MRN: [id number] Date of Birth: 08/30/1940 Referring Provider:   Flowsheet Row PULMONARY REHAB OTHER RESP ORIENTATION from 01/10/2023 in [facility]  Referring Provider Dr. Vassie Loll        Number of Visits: 26  Reason for Discharge:  Patient reached a stable level of exercise. Patient independent in their exercise. Early Exit:  Personal and Lack of attendance  Smoking History:  Social History   Tobacco Use  Smoking Status Former  Smokeless Tobacco Former   Types: Chew    Diagnosis:  Dyspnea, unspecified type  ADL UCSD:  Pulmonary Assessment Scores     Row Name 01/10/23 0910         ADL UCSD   ADL Phase Entry     SOB Score total 7     Rest 0     Walk 0     Stairs 4     Bath 0     Dress 0     Shop 0       CAT Score   CAT Score 4       mMRC Score   mMRC Score 1              Initial Exercise Prescription:  Initial Exercise Prescription - 01/10/23 1000       Date of Initial Exercise RX and Referring Provider   Date 01/10/23    Referring Provider Dr. Vassie Loll      Oxygen   Maintain Oxygen Saturation 88% or higher      Treadmill   MPH 2.5    Grade 0.5    Minutes 15      REL-XR   Level 3    Speed 60    Minutes 15      Prescription Details   Frequency (times per week) 3    Duration Progress to 30 minutes of continuous aerobic without signs/symptoms of physical distress      Intensity   THRR 40-80% of Max Heartrate 95-125    Ratings of Perceived Exertion 11-13    Perceived Dyspnea 0-4      Resistance Training   Training Prescription Yes    Weight 4    Reps 10-15             Discharge Exercise Prescription (Final Exercise Prescription Changes):  Exercise Prescription Changes - 04/02/23 1200       Response to Exercise   Blood Pressure (Admit) 120/60    Blood Pressure (Exit) 120/64    Heart Rate (Admit) 74 bpm    Heart Rate (Exercise) 88 bpm     Heart Rate (Exit) 82 bpm    Oxygen Saturation (Admit) 95 %    Oxygen Saturation (Exercise) 96 %    Oxygen Saturation (Exit) 96 %    Rating of Perceived Exertion (Exercise) 12    Perceived Dyspnea (Exercise) 0    Duration Continue with 30 min of aerobic exercise without signs/symptoms of physical distress.    Intensity THRR unchanged      Progression   Progression Continue to progress workloads to maintain intensity without signs/symptoms of physical distress.      Resistance Training   Training Prescription Yes    Weight 8lbs    Reps 10-15      Treadmill   MPH 2.6    Grade 2    Minutes 15    METs 3.71  Functional Capacity:  6 Minute Walk     Row Name 01/10/23 1001         6 Minute Walk   Phase Initial     Distance 1455 feet     Walk Time 6 minutes     # of Rest Breaks 0     MPH 2.75     METS 5.25     RPE 9     Perceived Dyspnea  0     VO2 Peak 18.38     Symptoms No     Resting HR 65 bpm     Resting BP 140/80     Resting Oxygen Saturation  96 %     Exercise Oxygen Saturation  during 6 min walk 95 %     Max Ex. HR 94 bpm     Max Ex. BP 146/80     2 Minute Post BP 140/78       Interval HR   1 Minute HR 84     2 Minute HR 87     3 Minute HR 86     4 Minute HR 90     5 Minute HR 92     6 Minute HR 94     2 Minute Post HR 76     Interval Heart Rate? Yes       Interval Oxygen   Interval Oxygen? Yes     Baseline Oxygen Saturation % 96 %     1 Minute Oxygen Saturation % 98 %     1 Minute Liters of Oxygen 0 L     2 Minute Oxygen Saturation % 95 %     2 Minute Liters of Oxygen 0 L     3 Minute Oxygen Saturation % 96 %     3 Minute Liters of Oxygen 0 L     4 Minute Oxygen Saturation % 96 %     4 Minute Liters of Oxygen 0 L     5 Minute Oxygen Saturation % 96 %     5 Minute Liters of Oxygen 0 L     6 Minute Oxygen Saturation % 97 %     6 Minute Liters of Oxygen 0 L     2 Minute Post Oxygen Saturation % 97 %     2 Minute Post Liters of  Oxygen 0 L              Psychological, QOL, Others - Outcomes: PHQ 2/9:    01/10/2023    9:06 AM  Depression screen PHQ 2/9  Decreased Interest 0  Down, Depressed, Hopeless 0  PHQ - 2 Score 0  Altered sleeping 0  Tired, decreased energy 2  Change in appetite 0  Feeling bad or failure about yourself  0  Trouble concentrating 0  Moving slowly or fidgety/restless 0  Suicidal thoughts 0  PHQ-9 Score 2  Difficult doing work/chores Not difficult at all    Nutrition & Weight - Outcomes:  Pre Biometrics - 01/10/23 1007       Pre Biometrics   Height 5\' 10"  (1.778 m)    Weight 229 lb 0.9 oz (103.9 kg)    Waist Circumference 45 inches    Hip Circumference 42 inches    Waist to Hip Ratio 1.07 %    BMI (Calculated) 32.87    Grip Strength 29.4 kg    Single Leg Stand 60 seconds  Goals reviewed with patient; copy given to patient.

## 2023-06-25 ENCOUNTER — Encounter (HOSPITAL_COMMUNITY): Payer: 59

## 2023-07-02 ENCOUNTER — Encounter (HOSPITAL_COMMUNITY): Payer: 59

## 2023-07-09 ENCOUNTER — Encounter (HOSPITAL_COMMUNITY): Payer: 59

## 2023-07-16 ENCOUNTER — Encounter (HOSPITAL_COMMUNITY): Payer: 59

## 2023-07-23 ENCOUNTER — Encounter (HOSPITAL_COMMUNITY): Payer: 59

## 2023-07-30 ENCOUNTER — Encounter (HOSPITAL_COMMUNITY): Payer: 59

## 2023-07-31 ENCOUNTER — Ambulatory Visit
Admission: RE | Admit: 2023-07-31 | Discharge: 2023-07-31 | Disposition: A | Discharge status: Home / Self Care | Source: Ambulatory Visit | Attending: Family Medicine | Admitting: Family Medicine

## 2023-07-31 ENCOUNTER — Other Ambulatory Visit: Payer: Self-pay | Admitting: Family Medicine

## 2023-07-31 DIAGNOSIS — R0602 Shortness of breath: Secondary | ICD-10-CM

## 2023-08-02 ENCOUNTER — Encounter: Payer: Self-pay | Admitting: Cardiovascular Disease

## 2023-08-02 DIAGNOSIS — I10 Essential (primary) hypertension: Secondary | ICD-10-CM

## 2023-08-02 DIAGNOSIS — I340 Nonrheumatic mitral (valve) insufficiency: Secondary | ICD-10-CM

## 2023-08-02 DIAGNOSIS — Z9889 Other specified postprocedural states: Secondary | ICD-10-CM

## 2023-08-03 ENCOUNTER — Ambulatory Visit (HOSPITAL_COMMUNITY): Attending: Cardiology

## 2023-08-03 DIAGNOSIS — I340 Nonrheumatic mitral (valve) insufficiency: Secondary | ICD-10-CM | POA: Diagnosis not present

## 2023-08-03 DIAGNOSIS — Z9889 Other specified postprocedural states: Secondary | ICD-10-CM | POA: Insufficient documentation

## 2023-08-03 DIAGNOSIS — I10 Essential (primary) hypertension: Secondary | ICD-10-CM | POA: Diagnosis present

## 2023-08-03 LAB — ECHOCARDIOGRAM COMPLETE
Area-P 1/2: 2.87 cm2
P 1/2 time: 631 ms
S' Lateral: 3.5 cm

## 2023-08-06 ENCOUNTER — Encounter (HOSPITAL_COMMUNITY): Payer: 59

## 2023-08-06 ENCOUNTER — Ambulatory Visit: Attending: Nurse Practitioner | Admitting: Nurse Practitioner

## 2023-08-06 ENCOUNTER — Encounter: Payer: Self-pay | Admitting: Nurse Practitioner

## 2023-08-06 VITALS — BP 136/82 | HR 64 | Ht 70.0 in | Wt 227.0 lb

## 2023-08-06 DIAGNOSIS — E039 Hypothyroidism, unspecified: Secondary | ICD-10-CM

## 2023-08-06 DIAGNOSIS — I340 Nonrheumatic mitral (valve) insufficiency: Secondary | ICD-10-CM

## 2023-08-06 DIAGNOSIS — I6523 Occlusion and stenosis of bilateral carotid arteries: Secondary | ICD-10-CM

## 2023-08-06 DIAGNOSIS — I10 Essential (primary) hypertension: Secondary | ICD-10-CM

## 2023-08-06 DIAGNOSIS — I480 Paroxysmal atrial fibrillation: Secondary | ICD-10-CM

## 2023-08-06 DIAGNOSIS — Z9889 Other specified postprocedural states: Secondary | ICD-10-CM

## 2023-08-06 DIAGNOSIS — R0609 Other forms of dyspnea: Secondary | ICD-10-CM | POA: Diagnosis not present

## 2023-08-06 DIAGNOSIS — Z86718 Personal history of other venous thrombosis and embolism: Secondary | ICD-10-CM

## 2023-08-06 DIAGNOSIS — E785 Hyperlipidemia, unspecified: Secondary | ICD-10-CM

## 2023-08-06 NOTE — Progress Notes (Signed)
 Office Visit    Patient Name: Benoit Meech Date of Encounter: 08/06/2023  Primary Care Provider:  Mosetta Putt, MD Primary Cardiologist:  Nanetta Batty, MD  Chief Complaint    83 year old male with a history of palpitations, severe mitral valve regurgitation s/p minimally invasive mitral valve repair in 08/2020, hypertension, hyperlipidemia, DVT, carotid artery stenosis, and hypothyroidism who presents for follow-up related to mitral valve regurgitation.    Past Medical History    Past Medical History:  Diagnosis Date   Aortic atherosclerosis (HCC)    Bradycardia    while on BB with HR to the 40s   Carotid stenosis    CAROTID DOPPLER, 12/07/2011 - Mild arthrosclerotic changes, no high-grade stenosis   Cataract    rt. eye   Clostridium difficile infection 05/2019   Diverticulitis    Heart murmur    History of echocardiogram    Echo 9/16:  EF 60-65%, no RWMA, Gr 1 DD, holosystolic MVP of posterior leaflet, mild MR, LA upper limits of normal, normal RVF, mod TR, PASP 50 mmHg   Hypercholesteremia    Hyperlipidemia    Hypertension    Hypothyroidism    MVP (mitral valve prolapse)    a. Echo 12/07/2011 - EF-65-70%, severe mitral regurgitation, moderate-severe tricuspid regurgitation, moderate pulmonary HTN, moderate pulmonic regurgitation;  b. 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   Complex valvuloplasty including artificial Gore-tex neochords x10 with 28 mm Medtronic Simuform ring annuloplasty   Testicular hypofunction    Vitamin D deficiency    Past Surgical History:   Procedure Laterality Date   COLONOSCOPY     ETHMOIDECTOMY Left 08/13/2020   Procedure: SPENOIDECTOMY AND ETHMOIDECTOMY;  Surgeon: Drema Halon, MD;  Location: [facility] OR;  Service: ENT;  Laterality: Left;   MITRAL VALVE REPAIR Right 09/22/2020   Procedure: MINIMALLY INVASIVE MITRAL VALVE REPAIR (MVR) USING MEDTRONIC SIMUFORM SEMI-RIGID ANNULOPLASTY RING SIZE ON PUMP;  Surgeon: Purcell Nails, MD;  Location: [facility] OR;  Service: Open Heart Surgery;  Laterality: Right;   RIGHT/LEFT HEART CATH AND CORONARY ANGIOGRAPHY N/A 08/30/2020   Procedure: RIGHT/LEFT HEART CATH AND CORONARY ANGIOGRAPHY;  Surgeon: Runell Gess, MD;  Location: [facility] INVASIVE CV LAB;  Service: Cardiovascular;  Laterality: N/A;   SIGMOIDOSCOPY  02/14/2017   SINUS ENDO WITH FUSION Left 08/13/2020   Procedure: SINUS ENDO WITH FUSION;  Surgeon: Drema Halon, MD;  Location: [facility] OR;  Service: ENT;  Laterality: Left;   TEE WITHOUT CARDIOVERSION N/A 05/04/2020   Procedure: TRANSESOPHAGEAL ECHOCARDIOGRAM (TEE);  Surgeon: Little Ishikawa, MD;  Location: [facility] ENDOSCOPY;  Service: Cardiovascular;  Laterality: N/A;   TEE WITHOUT CARDIOVERSION N/A 09/22/2020   Procedure: TRANSESOPHAGEAL ECHOCARDIOGRAM (TEE);  Surgeon: Purcell Nails, MD;  Location: [facility] OR;  Service: Open Heart Surgery;  Laterality: N/A;    Allergies  Allergies  Allergen Reactions   Contrast Media [Iodinated Contrast Media] Hives, Nausea And Vomiting and Cough    Pt will need a 13 hour prep before any contrast per Dr. Paulina Fusi (05/14/19)   Atorvastatin Other (See Comments)    Myalgias,  joint pain   Hctz [Hydrochlorothiazide] Other (See Comments)    palpitations   Metoprolol Succinate [Metoprolol] Other (See Comments)    Bradycardia       Labs/Other Studies Reviewed    The following studies were reviewed today:  Cardiac Studies & Procedures    ______________________________________________________________________________________________ CARDIAC CATHETERIZATION  CARDIAC CATHETERIZATION 08/30/2020  Narrative Images from the original result were not included.   Prox RCA to Mid RCA lesion is 30% stenosed.  Mid LAD lesion is 30% stenosed.  Hemodynamic findings consistent with mitral valve regurgitation.  Sian Joles is a 83 y.o. male   [id number] LOCATION:  FACILITY: [facility] PHYSICIAN: Nanetta Batty, M.D. [birth year]   DATE OF PROCEDURE:  08/30/2020  DATE OF DISCHARGE:     CARDIAC CATHETERIZATION    History obtained from chart review.Arland Usery is a 83 y.o.  moderately overweight, married Caucasian male, father of 2, grandfather to 2 grandchildren whose son [name] is also a patient of mine, as is his wife. I saw him 05/28/2020. He was worked up for palpitations and had an echo that showed severe MR with mild mitral valve prolapse and mild LV size and function. His other problems include hypertension and hyperlipidemia. He lives an active lifestyle and is actually heading out to [place] to go coyote hunting for a week. He has changed his diet and has lost 15 pounds. He has cut out caffeine and alcohol. He has had 2 negative sleep studies. He is otherwise asymptomatic. Since I saw him several years ago he has seen Tereso Newcomer in the office for evaluation of palpitations. A 2-D echo revealed normal LV function with holosystolic mitral valve prolapse, mild MR and moderate TR. A 48 hour Holter monitor showed PVCs, short runs of nonsustained ventricular tachycardia and short runs of SVT. Since stopping his diuretic his palpitations have improved.  I did refer him to Dr. Ladona Ridgel who recommended conservative therapy noting he had PACs and PVCs.  He recommended avoidance of caffeine and alcohol.  He is referred back by Dr. Duaine Dredge for follow-up of his mitral regurgitation because of a murmur which he thought was louder than he had  remembered.  Repeat 2D echo performed 11/14/2017 revealed moderate to severe MR with posterior leaflet prolapse, normal LV size and function.  He is completely asymptomatic.  He purchased his ZR1 Corvette and recently rode around the [place] 150 mph.  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    He does complain of increasing dyspnea over the last 6 to 12 months.  He had a transesophageal echo performed by Dr. Bjorn Pippin 05/04/2020 revealing a flail P2 segment with severe MR.  He wishes to proceed with mini mitral repair with Dr. Cornelius Moras.  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  He is a great candidate for a "mini mitral" repair for severe MR and normal LV function.  The radial sheath was removed and a TR band was placed on the right wrist to achieve patent hemostasis.  The patient left lab in stable condition.  He will be discharged home later today as an outpatient and will see me back in the office next week for follow-up.  Nanetta Batty. MD, [facility] 08/30/2020 9:27 AM  Findings Coronary Findings Diagnostic  Dominance: Right  Left Anterior Descending Mid LAD lesion is 30% stenosed.  Right Coronary Artery Prox RCA to Mid RCA  lesion is 30% stenosed.  Intervention  No interventions have been documented.   STRESS TESTS  MYOCARDIAL PERFUSION IMAGING 06/18/2015  Narrative  Nuclear stress EF: 60%.  Blood pressure demonstrated a hypertensive response to exercise.  There was no ST segment deviation noted during stress.  The study is normal.  This is a low risk study.  The left ventricular ejection fraction is normal (55-65%).   ECHOCARDIOGRAM  ECHOCARDIOGRAM COMPLETE 08/03/2023  Narrative ECHOCARDIOGRAM REPORT    Patient Name:   Salome Cozby    Date of Exam: 08/03/2023 Medical Rec #:  [id number]     Height:       70.0 in Accession #:     [id number]    Weight:       229.1 lb Date of Birth:  05-01-1941     BSA:          2.211 m Patient Age:    82 years      BP:           142/84 mmHg Patient Gender: M             HR:           83 bpm. Exam Location:  [address]  Procedure: 2D Echo, 3D Echo, Cardiac Doppler and Color Doppler (Both Spectral and Color Flow Doppler were utilized during procedure).  Indications:    I10 Hypertension  History:        Patient has prior history of Echocardiogram examinations, most recent 09/06/2022. Mitral Valve Prolapse and Mitral Valve Disease, Arrythmias:PVC, Signs/Symptoms:Murmur, Edema, Fatigue and Shortness of Breath; Risk Factors:Hypertension, Dyslipidemia, Family History of Coronary Artery Disease and Former Smoker. Status Post Mitral Valve Repair (09-22-20, 28mm Medtronic Simuform Ring Annuloplasty with Neochord Placement (x10), Palpitations.  Mitral Valve: 28 mm prosthetic annuloplasty ring valve is present in the mitral position. Procedure Date: 09/22/20.  Sonographer:    Farrel Conners RDCS Referring Phys: Delton See BERRY  IMPRESSIONS   1. Left ventricular ejection fraction, by estimation, is 60 to 65%. Left ventricular ejection fraction by 3D volume is 64 %. The left ventricle has normal function. The left ventricle has no regional wall motion abnormalities. Left ventricular diastolic parameters are consistent with Grade I diastolic dysfunction (impaired relaxation). 2. Right ventricular systolic function is normal. The right ventricular size is normal. 3. Left atrial size was mildly dilated. 4. The mitral valve has been repaired/replaced. No evidence of mitral valve regurgitation. No evidence of mitral stenosis. There is a 28 mm prosthetic annuloplasty ring present in the mitral position. Procedure Date: 09/22/20. Echo findings are consistent with normal structure and function of the mitral valve prosthesis. 5. The aortic valve is normal in structure. Aortic valve regurgitation  is mild. No aortic stenosis is present. Aortic regurgitation PHT measures 631 msec. 6. The inferior vena cava is normal in size with greater than 50% respiratory variability, suggesting right atrial pressure of 3 mmHg.  FINDINGS Left Ventricle: Left ventricular ejection fraction, by estimation, is 60 to 65%. Left ventricular ejection fraction by 3D volume is 64 %. The left ventricle has normal function. The left ventricle has no regional wall motion abnormalities. The left ventricular internal cavity size was normal in size. There is no left ventricular hypertrophy. Left ventricular diastolic parameters are consistent with Grade I diastolic dysfunction (impaired relaxation).  Right Ventricle: The right ventricular size is normal. No increase in right ventricular wall thickness. Right ventricular systolic function is normal.  Left Atrium: Left atrial size was mildly dilated.  Right Atrium: Right atrial size was normal in size.  Pericardium: There is no evidence of pericardial effusion.  Mitral Valve: The mitral valve has been repaired/replaced. No evidence of mitral valve regurgitation. There is a 28 mm prosthetic annuloplasty ring present in the mitral position. Procedure Date: 09/22/20. Echo findings are consistent with normal structure and function of the mitral valve prosthesis. No evidence of mitral valve stenosis.  Tricuspid Valve: The tricuspid valve is normal in structure. Tricuspid valve regurgitation is mild . No evidence of tricuspid stenosis.  Aortic Valve: The aortic valve is normal in structure. Aortic valve regurgitation is mild. Aortic regurgitation PHT measures 631 msec. No aortic stenosis is present.  Pulmonic Valve: The pulmonic valve was normal in structure. Pulmonic valve regurgitation is mild. No evidence of pulmonic stenosis.  Aorta: The aortic root is normal in size and structure.  Venous: The inferior vena cava is normal in size with greater than 50% respiratory  variability, suggesting right atrial pressure of 3 mmHg.  IAS/Shunts: No atrial level shunt detected by color flow Doppler.  Additional Comments: 3D was performed not requiring image post processing on an independent workstation and was normal.   LEFT VENTRICLE PLAX 2D LVIDd:         4.70 cm         Diastology LVIDs:         3.50 cm         LV e' medial:    5.22 cm/s LV PW:         0.90 cm         LV E/e' medial:  21.1 LV IVS:        0.80 cm         LV e' lateral:   6.96 cm/s LVOT diam:     2.50 cm         LV E/e' lateral: 15.8 LV SV:         81 LV SV Index:   37 LVOT Area:     4.91 cm        3D Volume EF LV 3D EF:    Left ventricul ar ejection fraction by 3D volume is 64 %.  3D Volume EF: 3D EF:        64 % LV EDV:       116 ml LV ESV:       42 ml LV SV:        74 ml  RIGHT VENTRICLE RV Basal diam:  3.80 cm RV S prime:     15.40 cm/s TAPSE (M-mode): 2.0 cm RVSP:           32.8 mmHg  LEFT ATRIUM             Index        RIGHT ATRIUM           Index LA diam:        4.60 cm 2.08 cm/m   RA Pressure: 3.00 mmHg LA Vol (A2C):   72.7 ml 32.88 ml/m  RA Area:     20.80 cm LA Vol (A4C):   84.8 ml 38.35 ml/m  RA Volume:   65.10 ml  29.44 ml/m LA Biplane Vol: 82.5 ml 37.31 ml/m AORTIC VALVE LVOT Vmax:   74.50 cm/s LVOT Vmean:  49.050 cm/s LVOT VTI:    0.166 m AI PHT:      631 msec  AORTA Ao Root diam: 3.90 cm Ao Asc diam:  3.75 cm  MITRAL  VALVE                TRICUSPID VALVE MV Area (PHT)  cm          TR Peak grad:   29.8 mmHg MV Decel Time: 264 msec     TR Vmax:        273.00 cm/s MV E velocity: 110.00 cm/s  Estimated RAP:  3.00 mmHg MV A velocity: 131.50 cm/s  RVSP:           32.8 mmHg MV E/A ratio:  0.84 SHUNTS Systemic VTI:  0.17 m Systemic Diam: 2.50 cm  Donato Schultz MD Electronically signed by Donato Schultz MD Signature Date/Time: 08/03/2023/2:14:45 PM    Final   TEE  ECHO INTRAOPERATIVE TEE 09/22/2020  Narrative *INTRAOPERATIVE TRANSESOPHAGEAL  REPORT *    Patient Name:   FRANCK [facility] Date of Exam: 09/22/2020 Medical Rec #:  [id number]  Height:       71.0 in Accession #:    [id number] Weight:       224.0 lb Date of Birth:  [birth year]  BSA:          2.21 m Patient Age:    79 years   BP:           185/75 mmHg Patient Gender: M          HR:           68 bpm. Exam Location:  Inpatient  Transesophogeal exam was perform intraoperatively during surgical procedure. Patient was closely monitored under general anesthesia during the entirety of examination.  Indications:     mitral regurgitation Performing Phys: [id number] CLARENCE H OWEN Diagnosing Phys: Earl Lites Stoltzfus  Complications: No known complications during this procedure. POST-OP IMPRESSIONS - Left Ventricle: The left ventricle is unchanged from pre-bypass. - Right Ventricle: The right ventricle appears unchanged from pre-bypass. - Aorta: The aorta appears unchanged from pre-bypass. - Left Atrial Appendage: The left atrial appendage appears unchanged from pre-bypass. - Aortic Valve: The aortic valve appears unchanged from pre-bypass. - Mitral Valve: No stenosis present. There is no regurgitation. The mitral valve is status post repair with an annuloplasty ring. - Tricuspid Valve: The tricuspid valve appears unchanged from pre-bypass. - Pulmonic Valve: The pulmonic valve appears unchanged from pre-bypass. - Interatrial Septum: The interatrial septum appears unchanged from pre-bypass. - Pericardium: The pericardium appears unchanged from pre-bypass. - Comments: S/P MVR with size 28mm annuloplasty ring. Emergence on Phenylephrine support. Ring seated appropriately without rock or perivalvular leak. Mean PG post procedure. No new or worsening wall motion or valvular abnormalities.  PRE-OP FINDINGS Left Ventricle: The left ventricle has normal systolic function, with an ejection fraction of 60-65%. The cavity size was normal. No evidence of left ventricular regional wall  motion abnormalities. There is no left ventricular hypertrophy.   Right Ventricle: The right ventricle has normal systolic function. The cavity was normal. There is no increase in right ventricular wall thickness. Right ventricular systolic pressure is normal.  Left Atrium: Left atrial size was dilated. No left atrial/left atrial appendage thrombus was detected. The left atrial appendage is well visualized and there is no evidence of thrombus present. Left atrial appendage velocity is normal at greater than 40 cm/s.  Right Atrium: Right atrial size was normal in size.  Interatrial Septum: No atrial level shunt detected by color flow Doppler. Agitated saline contrast was given intravenously to evaluate for intracardiac shunting. Agitated saline contrast bubble study was negative, with no evidence of any interatrial shunt.  There is no evidence of a patent foramen ovale.  Pericardium: There is no evidence of pericardial effusion. There is no pleural effusion.  Mitral Valve: The mitral valve is myxomatous. Mitral valve regurgitation is severe by color flow Doppler. The MR jet is anteriorly-directed. There is no evidence of mitral valve vegetation. Pulmonary venous flow shows systolic flow reversal. There is severe mitral valve regurgitation by PISA measuring 7.60. There is severe chordal rupture of the posterior leaflet of the mitral valve. There is No evidence of mitral stenosis. Severe flail of the the mitral valve P2 cusp was present. P2 flail with systolic reversal in multiple pulmonary veins. 2D PISA radius 1.1cm, EROA 0.62cm2, MR volume 107cc. 3D VCA 0.64cm2.  Tricuspid Valve: The tricuspid valve was normal in structure. Tricuspid valve regurgitation is trivial by color flow Doppler. No evidence of tricuspid stenosis is present. There is no evidence of tricuspid valve vegetation.  Aortic Valve: The aortic valve is tricuspid Aortic valve regurgitation was not visualized by color flow Doppler.  There is no stenosis of the aortic valve, with a calculated valve area of 3.39 cm. There is no evidence of aortic valve vegetation.   Pulmonic Valve: The pulmonic valve was normal in structure. Pulmonic valve regurgitation is trivial by color flow Doppler.   Aorta: The aortic root and ascending aorta are normal in size and structure.  Pulmonary Artery: Theone Murdoch catheter present on the right. The pulmonary artery is of normal size.  Venous: The inferior vena cava is normal in size with greater than 50% respiratory variability, suggesting right atrial pressure of 3 mmHg.  Shunts: There is no evidence of an atrial septal defect.  +--------------+--------++ LEFT VENTRICLE         +--------------+--------++ +----------------------+-------++ PLAX 2D                2D Longitudinal Strain        +--------------+--------++ +----------------------+-------++ LVIDd:        5.66 cm  2D Strain GLS (A2C):  -14.7 % +--------------+--------++ +----------------------+-------++ LVIDs:        3.55 cm  2D Strain GLS (A3C):  -15.7 % +--------------+--------++ +----------------------+-------++ LV PW:        1.00 cm  2D Strain GLS (A4C):  -21.1 % +--------------+--------++ +----------------------+-------++ LV IVS:       1.10 cm  +--------------+--------++ +------------+---------++ LVOT diam:    2.70 cm  3D Volume EF          +--------------+--------++ +------------+---------++ LV SV:        105 ml   LV 3D EDV:  125.95 ml +--------------+--------++ +------------+---------++ LV SV Index:  45.92    LV 3D ESV:  63.10 ml  +--------------+--------++ +------------+---------++ LVOT Area:    5.73 cm +--------------+--------++                        +--------------+--------++  +------------------+-----------++ AORTIC VALVE                  +------------------+-----------++ AV Area (Vmax):   3.18 cm     +------------------+-----------++ AV Area (Vmean):  3.94 cm    +------------------+-----------++ AV Area (VTI):    3.39 cm    +------------------+-----------++ AV Vmax:          135.00 cm/s +------------------+-----------++ AV Vmean:         80.000 cm/s +------------------+-----------++ AV VTI:           0.270 m     +------------------+-----------++ AV Peak Grad:  7.3 mmHg    +------------------+-----------++ AV Mean Grad:     3.0 mmHg    +------------------+-----------++ LVOT Vmax:        75.00 cm/s  +------------------+-----------++ LVOT Vmean:       55.000 cm/s +------------------+-----------++ LVOT VTI:         0.160 m     +------------------+-----------++ LVOT/AV VTI ratio:0.59        +------------------+-----------++  +--------------+-------++ AORTA                 +--------------+-------++ Ao Root diam: 4.20 cm +--------------+-------++ Ao Sinus diam:4.20 cm +--------------+-------++ Ao STJ diam:  3.5 cm  +--------------+-------++ Ao Asc diam:  3.90 cm +--------------+-------++  +--------------+----------++ MITRAL VALVE                +--------------+-------+ +--------------+----------++    SHUNTS                MV Area (PHT):3.45 cm      +--------------+-------+ +--------------+----------++    Systemic VTI: 0.16 m  MV Peak grad: 4.8 mmHg      +--------------+-------+ +--------------+----------++    Systemic Diam:2.70 cm MV Mean grad: 2.3 mmHg      +--------------+-------+ +--------------+----------++ MV Vmax:      1.09 m/s   +--------------+----------++ MV Vmean:     73.1 cm/s  +--------------+----------++ MV VTI:       0.27 m     +--------------+----------++ MV PHT:       63.80 msec +--------------+----------++ MV Decel Time:220 msec   +--------------+----------++ +----------------+-----------++ MR Peak grad:   88.4 mmHg    +----------------+-----------++ MR Mean grad:   58.0 mmHg   +----------------+-----------++ MR Vmax:        470.00 cm/s +----------------+-----------++ MR Vmean:       361.0 cm/s  +----------------+-----------++ MR PISA:        7.60 cm    +----------------+-----------++ MR PISA Eff ROA:62 mm      +----------------+-----------++ MR PISA Radius: 1.10 cm     +----------------+-----------++ +--------------+----------++ MV E velocity:70.00 cm/s +--------------+----------++ MV A velocity:64.00 cm/s +--------------+----------++ MV E/A ratio: 1.09       +--------------+----------++   Hester Mates Electronically signed by Hester Mates Signature Date/Time: 09/23/2020/1:46:49 PM    Final  MONITORS  CARDIAC EVENT MONITOR 12/27/2020       ______________________________________________________________________________________________     Recent Labs: No results found for requested labs within last 365 days.  Recent Lipid Panel No results found for: "CHOL", "TRIG", "HDL", "CHOLHDL", "VLDL", "LDLCALC", "LDLDIRECT"  History of Present Illness    83 year old male with the above past medical history including palpitations, severe mitral valve regurgitation s/p minimally invasive mitral valve repair in 08/2020, hypertension, hyperlipidemia, DVT, carotid artery stenosis, and hypothyroidism.   Prior cardiac monitor showed PVCs, short runs of NSVT, PSVT.  He was evaluated by EP who recommended conservative therapy.  TEE in 05/2020 revealed a flail P2 segment with severe MR.  R/LHC in 2022 revealed normal coronary arteries, severe MR.  Pre-surgery dopplers with 1 to 39% B ICA stenosis.  He underwent minimally invasive mitral valve repair with Dr. Cornelius Moras in 08/2020.  1 month later he was hospitalized with a DVT.  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   Subsequent Zio patch did not show any recurrent arrhythmia.  Eliquis was ultimately discontinued.  Of note, he has a history of statin intolerance, follows with Dr. Rennis Golden in the lipid clinic, on [place].  He was last seen in the office on 12/19/2022 and was stable from a cardiac standpoint, he did report mild dyspnea on exertion, particularly when climbing stairs.  He was evaluated pulmonology, and referred to pulmonary rehab.  Most recent echocardiogram in 07/2023 revealed EF 60 to 65%, normal LV function, no RWMA, G1 DD, normal RV systolic function, stable prosthetic angioplasty ring in the mitral position with normal structure and function mild aortic valve regurgitation.    He presents today for follow-up.  Since his last visit he has been stable overall from a cardiac standpoint.  He is no longer participating in pulmonary rehab, but continues to follow with pulmonology.  He had COVID in December 2024.  Since this time he has noticed increased fatigue, worsening shortness of breath with significant exertion. He does report some difficulty sleeping, he often wakes up in the night to use the bathroom. Despite this, he remains active. 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 He rides on his dirt bike for fun. He denies chest pain palpitations, dizziness, edema, PND, orthopnea, weight gain.   Home Medications    Current Outpatient Medications  Medication Sig Dispense Refill   amoxicillin (AMOXIL) 500 MG capsule Take 4 capsules (2,000mg ) by mouth 30-60 minutes prior to dental work or cleaning. 4 capsule 3   azithromycin (ZITHROMAX) 250 MG tablet Take by mouth.     Cholecalciferol (VITAMIN D) 50 MCG (2000 UT) tablet Take 2,000 Units by mouth daily.     Evolocumab (REPATHA SURECLICK) 140 MG/ML SOAJ INJECT 140MG  SUBCUTANEOUSLY EVERY 14 DAYS 2 mL 11   ezetimibe (ZETIA) 10 MG tablet Take 1 tablet (10 mg total) by mouth daily. 90 tablet 3   levothyroxine (SYNTHROID) 75 MCG  tablet Take 75 mcg by mouth at bedtime.     losartan (COZAAR) 50 MG tablet Take 50 mg by mouth daily.     tamsulosin (FLOMAX) 0.4 MG CAPS capsule Take 0.4 mg by mouth at bedtime.     vitamin B-12 (CYANOCOBALAMIN) 100 MCG tablet Take 100 mcg by mouth daily.     ALPRAZolam (XANAX) 0.5 MG tablet Take 0.5 mg by mouth at bedtime as needed. (Patient not taking: Reported on 08/06/2023)     No current facility-administered medications for this visit.     Review of Systems    He denies chest pain, palpitations, dyspnea, pnd, orthopnea, n, v, dizziness, syncope, edema, weight gain, or early satiety. All other systems reviewed and are otherwise negative except as noted above.   Physical Exam    VS:  BP 136/82 (BP Location: Left Arm, Patient Position: Sitting, Cuff Size: Large)   Pulse 64   Ht 5\' 10"  (1.778 m)   Wt 227 lb (103 kg)   SpO2 97%   BMI 32.57 kg/m   GEN: Well nourished, well developed, in no acute distress. HEENT: normal. Neck: Supple, no JVD, carotid bruits, or masses. Cardiac: RRR, murmurs, rubs, or gallops. No clubbing, cyanosis, edema.  Radials/DP/PT 2+ and equal bilaterally.  Respiratory:  Respirations regular and unlabored, clear to auscultation bilaterally. GI: Soft, nontender, nondistended, BS + x 4. MS: no deformity or atrophy. Skin: warm and dry, no rash. Neuro:  Strength and sensation are intact. Psych: Normal affect.  Accessory Clinical Findings    ECG personally reviewed by me today - EKG Interpretation Date/Time:  Monday August 06 2023 10:21:21 EDT Ventricular Rate:  64 PR Interval:  208 QRS Duration:  144 QT Interval:  440  QTC Calculation: 453 R Axis:   10  Text Interpretation: Normal sinus rhythm Left bundle branch block When compared with ECG of 19-Dec-2022 10:43, Nonspecific T wave abnormality no longer evident in Inferior leads Confirmed by Bernadene Person ([id number]) on 08/06/2023 10:43:47 AM  - no acute changes.   Lab Results  Component Value Date   WBC 5.8  11/10/2020   HGB 14.7 11/10/2020   HCT 43.2 11/10/2020   MCV 92.1 11/10/2020   PLT 189 11/10/2020   Lab Results  Component Value Date   CREATININE 1.18 11/10/2020   BUN 12 11/10/2020   NA 138 11/10/2020   K 4.2 11/10/2020   CL 104 11/10/2020   CO2 23 11/10/2020   Lab Results  Component Value Date   ALT 59 (H) 11/08/2020   AST 34 11/08/2020   ALKPHOS 81 11/08/2020   BILITOT 0.6 11/08/2020   No results found for: "CHOL", "HDL", "LDLCALC", "LDLDIRECT", "TRIG", "CHOLHDL"  Lab Results  Component Value Date   HGBA1C 6.0 (H) 09/20/2020    Assessment & Plan    1. 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  Most recent echocardiogram in 07/2023 revealed EF 60 to 65%, normal LV function, no RWMA, G1 DD, normal RV systolic function, stable prosthetic angioplasty ring in the mitral position with normal structure and function mild aortic valve regurgitation. Of note, LHC in 2022 revealed coronary arteries.  He has had chronic dyspnea on exertion. He report that he had COVID 19 in December 2024.  Since this time, he has noticed increased fatigue, worsening shortness of breath with significant exertion. He remains active despite this.  He denies chest pain.  Most recent echo reassuring.  He was previously evaluated by pulmonology, was participating in pulmonary rehab.  He has follow-up scheduled with pulmonology next month. If symptoms worsen or persist, consider need for ischemic evaluation with coronary CTA versus stress test.  Through shared decision making, will defer for now.  Continue SBE prophylaxis.    2. Palpitations/paroxysmal atrial fibrillation: Previously on Coumadin/Eliquis. This has since been discontinued given no recurrence of arrhythmia. Maintaining NSR. Denies any recent palpitations.    3. Hypertension: BP well controlled. Continue current antihypertensive regimen.    4. Hyperlipidemia: LDL was 45 in 08/2022.  Follows  with Dr. Rennis Golden, he is due for repeat lipids, he has an active order will have this drawn.  Continue Repatha.   5. History of DVT: Occurred in the postop setting. Previously on Eliquis. No recurrence.    6. Carotid artery stenosis: Pre-surgery dopplers in 2022 showed 1-39% BICA stenosis. Asymptomatic. No indication for repeat study at this time.   7. Hypothyroidism: Most recent TSH not available for review. Monitored and managed per PCP. Continue levothyroxine.    8. Disposition: Follow-up in 4-6 months with Dr. Allyson Sabal.       Joylene Grapes, NP 08/06/2023, 10:44 AM

## 2023-08-06 NOTE — Patient Instructions (Signed)
 Medication Instructions:  Your physician recommends that you continue on your current medications as directed. Please refer to the Current Medication list given to you today.  *If you need a refill on your cardiac medications before your next appointment, please call your pharmacy*  Lab Work: Please complete fasting lab work previously ordered. Nothing to eat after midnight. Please drink water.  Testing/Procedures: NONE ordered at this time of appointment    Follow-Up: At [facility], you and your health needs are our priority.  As part of our continuing mission to provide you with exceptional heart care, our providers are all part of one team.  This team includes your primary Cardiologist (physician) and Advanced Practice Providers or APPs (Physician Assistants and Nurse Practitioners) who all work together to provide you with the care you need, when you need it.  Your next appointment:   4-6 month(s)  Provider:   Nanetta Batty, MD     We recommend signing up for the patient portal called "MyChart".  Sign up information is provided on this After Visit Summary.  MyChart is used to connect with patients for Virtual Visits (Telemedicine).  Patients are able to view lab/test results, encounter notes, upcoming appointments, etc.  Non-urgent messages can be sent to your provider as well.   To learn more about what you can do with MyChart, go to [email or web address].   Other Instructions       1st Floor: - Lobby - Registration  - Pharmacy  - Lab - Cafe  2nd Floor: - PV Lab - Diagnostic Testing (echo, CT, nuclear med)  3rd Floor: - Vacant  4th Floor: - TCTS (cardiothoracic surgery) - AFib Clinic - Structural Heart Clinic - Vascular Surgery  - Vascular Ultrasound  5th Floor: - [facility] (general and EP) - Clinical Pharmacy for coumadin, hypertension, lipid, weight-loss medications, and med management appointments    Valet parking services will  be available as well.

## 2023-08-13 ENCOUNTER — Encounter (HOSPITAL_COMMUNITY): Payer: 59

## 2023-08-14 ENCOUNTER — Encounter (HOSPITAL_BASED_OUTPATIENT_CLINIC_OR_DEPARTMENT_OTHER): Payer: Self-pay

## 2023-08-20 ENCOUNTER — Encounter (HOSPITAL_COMMUNITY): Payer: 59

## 2023-08-27 ENCOUNTER — Encounter (HOSPITAL_COMMUNITY): Payer: 59

## 2023-08-28 ENCOUNTER — Encounter (HOSPITAL_BASED_OUTPATIENT_CLINIC_OR_DEPARTMENT_OTHER): Payer: Self-pay

## 2023-08-28 ENCOUNTER — Encounter (HOSPITAL_BASED_OUTPATIENT_CLINIC_OR_DEPARTMENT_OTHER): Payer: Self-pay | Admitting: Pulmonary Disease

## 2023-09-03 ENCOUNTER — Encounter (HOSPITAL_COMMUNITY): Payer: 59

## 2023-09-18 ENCOUNTER — Ambulatory Visit (HOSPITAL_BASED_OUTPATIENT_CLINIC_OR_DEPARTMENT_OTHER): Admitting: Pulmonary Disease

## 2023-11-16 ENCOUNTER — Encounter: Payer: Self-pay | Admitting: Cardiovascular Disease

## 2023-12-04 ENCOUNTER — Ambulatory Visit: Attending: Cardiovascular Disease | Admitting: Cardiovascular Disease

## 2023-12-04 ENCOUNTER — Encounter: Payer: Self-pay | Admitting: Cardiovascular Disease

## 2023-12-04 VITALS — BP 148/90 | HR 61 | Ht 70.0 in | Wt 227.4 lb

## 2023-12-04 DIAGNOSIS — I10 Essential (primary) hypertension: Secondary | ICD-10-CM

## 2023-12-04 DIAGNOSIS — E782 Mixed hyperlipidemia: Secondary | ICD-10-CM

## 2023-12-04 DIAGNOSIS — Z9889 Other specified postprocedural states: Secondary | ICD-10-CM

## 2023-12-04 DIAGNOSIS — I340 Nonrheumatic mitral (valve) insufficiency: Secondary | ICD-10-CM

## 2023-12-04 NOTE — Assessment & Plan Note (Signed)
 History of severe mitral regurgitation status post right left heart cath which I performed in evaluation of this revealing normal coronary arteries.  He had minimally invasive mitral valve repair by Dr. Nguyen 09/22/2020.  He is doing well since with recent 2D echo performed//25 revealing normal LV systolic function, grade 1 diastolic dysfunction with a well-functioning mitral valve repair and annuloplasty ring.

## 2023-12-04 NOTE — Assessment & Plan Note (Signed)
 History of hyperlipidemia on Repatha  with lipid profile performed 09/06/2022 revealing total cholesterol 112, LDL 45 and HDL 46.  He is scheduled to have a lipid profile today.

## 2023-12-04 NOTE — Progress Notes (Signed)
 12/04/2023 Chad Berry   1940-12-11  [id number]  Primary Physician Windy Coy, MD Primary Cardiologist: Dorn JINNY Lesches MD [facility] [place], [place]  HPI:  Chad Berry is a 83 y.o.  moderately overweight, married Caucasian male, father of 2, grandfather to 2 grandchildren whose son Chad Berry is also a patient of mine, as is his wife. I last saw him 12/19/2022. He was worked up for palpitations and had an echo that showed severe MR with mild mitral valve prolapse and mild LV size and function. His other problems include hypertension and hyperlipidemia. He lives an active lifestyle and is actually heading out to [place]  to go coyote hunting for a week. He has changed his diet and has lost 15 pounds. He has cut out caffeine and alcohol. He has had 2 negative sleep studies. He is otherwise asymptomatic. Since I saw him several years ago he has seen Glendia Ferrier in the office for evaluation of palpitations. A 2-D echo revealed normal LV function with holosystolic mitral valve prolapse, mild MR and moderate TR. A 48 hour Holter monitor showed PVCs, short runs of nonsustained ventricular tachycardia and short runs of SVT. Since stopping his diuretic his palpitations have improved.   I did refer him to Dr. Waddell who recommended conservative therapy noting he had PACs and PVCs.  He recommended avoidance of caffeine and alcohol.  He is referred back by Dr. Windy for follow-up of his mitral regurgitation because of a murmur which he thought was louder than he had remembered.  Repeat 2D echo performed 11/14/2017 revealed moderate to severe MR with posterior leaflet prolapse, normal LV size and function.  He is completely asymptomatic.  He purchased his ZR1 Corvette and recently rode [place] reaching speeds above 150 mph.   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    He does complain of increasing dyspnea over the last  6 to 12 months.  He had a transesophageal echo performed by Dr. Kate 05/04/2020 revealing a flail P2 segment with severe MR.  He wishes to proceed with mini mitral repair with Dr. Dusty.  I performed right and left heart cath revealing normal coronary arteries and severe MR.  He underwent mitral valve repair minimally invasively by Dr. Dusty 09/22/2020 and was discharged several days later.  He was readmitted 10/22/2020 with chest pain was found to have a DVT on the right with a pulmonary embolus.  His Coumadin  was subtherapeutic which she was originally begun on because of perioperative PAF and he was transitioned to Eliquis  oral anticoagulation.  He is currently participating cardiac rehab.  I referred him to Dr. Waddell because of his PAF.  A Zio patch did not show any recurrent arrhythmias.  He remains on Eliquis .  He is still participating cardiac rehab.  He does 40 minutes on a bicycle, treadmill and stair stepper without symptoms.  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  He is very active and says that his energy level is back up to his preoperative level.  A follow-up 2D echo performed 12/06/2020 revealed normal LV systolic function with a well-functioning mitral valve and no regurgitation.  Lower extremity venous Doppler studies recently performed 02/01/2021 showed resolution of his right lower extremity DVT.  Since I saw him in the office a year ago he continues to do well.  He still is working on his 450 acre  farm having just planted 180 acres of corn.  He continues to workout.  He denies chest pain but does get mildly dyspneic when walking up 30 stairs.  His most recent 2D echo performed 08/03/2023 revealed normal LV systolic function with no evidence of MR.     Current Meds  Medication Sig   amoxicillin  (AMOXIL ) 500 MG capsule Take 4 capsules (2,000mg ) by mouth 30-60 minutes prior to dental work or cleaning.   Cholecalciferol (VITAMIN D) 50 MCG (2000 UT) tablet Take 2,000 Units by mouth  daily.   Evolocumab  (REPATHA  SURECLICK) 140 MG/ML SOAJ INJECT 140MG  SUBCUTANEOUSLY EVERY 14 DAYS   ezetimibe  (ZETIA ) 10 MG tablet Take 1 tablet (10 mg total) by mouth daily.   levothyroxine  (SYNTHROID ) 75 MCG tablet Take 75 mcg by mouth at bedtime.   tamsulosin (FLOMAX) 0.4 MG CAPS capsule Take 0.4 mg by mouth at bedtime.   vitamin B-12 (CYANOCOBALAMIN) 100 MCG tablet Take 100 mcg by mouth daily.     Allergies  Allergen Reactions   Contrast Media [Iodinated Contrast Media] Hives, Nausea And Vomiting and Cough    Pt will need a 13 hour prep before any contrast per Dr. Oneil Officer (05/14/19)   Atorvastatin  Other (See Comments)    Myalgias, joint pain   Hctz [Hydrochlorothiazide] Other (See Comments)    palpitations   Metoprolol  Succinate [Metoprolol ] Other (See Comments)    Bradycardia      Social History   Socioeconomic History   Marital status: Married    Spouse name: Not on file   Number of children: Not on file   Years of education: Not on file   Highest education level: Not on file  Occupational History   Not on file  Tobacco Use   Smoking status: Former   Smokeless tobacco: Former    Types: Engineer, drilling   Vaping status: Never Used  Substance and Sexual Activity   Alcohol use: Yes    Alcohol/week: 0.0 standard drinks of alcohol    Comment: occasionally   Drug use: No   Sexual activity: Not on file  Other Topics Concern   Not on file  Social History Narrative   Married, owner of Facilities manager company   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 [place]   Second home [place], enjoys fihing, cars (Has ZR1)   Social Drivers of Health   Financial Resource Strain: Not on file  Food Insecurity: Not on file  Transportation Needs: Not on file  Physical Activity: Not on file  Stress: Not on file  Social Connections: Unknown (09/09/2021)   [facility]   Social Network    Social Network: Not on file  Intimate Partner Violence: Unknown (08/02/2021)   Received from  [facility]   HITS    Physically Hurt: Not on file    Insult or Talk Down To: Not on file    Threaten Physical Harm: Not on file    Scream or Curse: Not on file     Review of Systems: General: negative for chills, fever, night sweats or weight changes.  Cardiovascular: negative for chest pain, dyspnea on exertion, edema, orthopnea, palpitations, paroxysmal nocturnal dyspnea or shortness of breath Dermatological: negative for rash Respiratory: negative for cough or wheezing Urologic: negative for hematuria Abdominal: negative for nausea, vomiting, diarrhea, bright red blood per rectum, melena, or hematemesis Neurologic: negative for visual changes, syncope, or dizziness All other systems reviewed and are otherwise negative except as noted above.    Blood pressure ROLLEN)  148/90, pulse 61, height 5' 10 (1.778 m), weight 227 lb 6.4 oz (103.1 kg), SpO2 98%.  General appearance: alert and no distress Neck: no adenopathy, no carotid bruit, no JVD, supple, symmetrical, trachea midline, and thyroid not enlarged, symmetric, no tenderness/mass/nodules Lungs: clear to auscultation bilaterally Heart: regular rate and rhythm, S1, S2 normal, no murmur, click, rub or gallop Extremities: extremities normal, atraumatic, no cyanosis or edema Pulses: 2+ and symmetric Skin: Skin color, texture, turgor normal. No rashes or lesions Neurologic: Grossly normal  EKG not performed today      ASSESSMENT AND PLAN:   Essential hypertension History of essential hypertension with blood pressure measured today at 148/90.  He was on losartan  which was discontinued by his PCP 6 months ago and his blood pressures much better controlled at home.  Hyperlipidemia History of hyperlipidemia on Repatha  with lipid profile performed 09/06/2022 revealing total cholesterol 112, LDL 45 and HDL 46.  He is scheduled to have a lipid profile today.  Mitral regurgitation History of severe mitral regurgitation status post right  left heart cath which I performed in evaluation of this revealing normal coronary arteries.  He had minimally invasive mitral valve repair by Dr. Nguyen 09/22/2020.  He is doing well since with recent 2D echo performed//25 revealing normal LV systolic function, grade 1 diastolic dysfunction with a well-functioning mitral valve repair and annuloplasty ring.     Dorn DOROTHA Lesches MD FACP,FACC,FAHA, [facility] 12/04/2023 11:10 AM

## 2023-12-04 NOTE — Patient Instructions (Signed)
 Medication Instructions:  Your physician recommends that you continue on your current medications as directed. Please refer to the Current Medication list given to you today.  *If you need a refill on your cardiac medications before your next appointment, please call your pharmacy*  Testing/Procedures: Your physician has requested that you have an echocardiogram. Echocardiography is a painless test that uses sound waves to create images of your heart. It provides your doctor with information about the size and shape of your heart and how well your heart's chambers and valves are working. This procedure takes approximately one hour. There are no restrictions for this procedure. Please do NOT wear cologne, perfume, aftershave, or lotions (deodorant is allowed). Please arrive 15 minutes prior to your appointment time.  Please note: We ask at that you not bring children with you during ultrasound (echo/ vascular) testing. Due to room size and safety concerns, children are not allowed in the ultrasound rooms during exams. Our front office staff cannot provide observation of children in our lobby area while testing is being conducted. An adult accompanying a patient to their appointment will only be allowed in the ultrasound room at the discretion of the ultrasound technician under special circumstances. We apologize for any inconvenience. **To do in April 2026**   Follow-Up: At [facility], you and your health needs are our priority.  As part of our continuing mission to provide you with exceptional heart care, our providers are all part of one team.  This team includes your primary Cardiologist (physician) and Advanced Practice Providers or APPs (Physician Assistants and Nurse Practitioners) who all work together to provide you with the care you need, when you need it.  Your next appointment:   12 month(s)  Provider:   Lauro Portal, MD    We recommend signing up for the patient portal  called "MyChart".  Sign up information is provided on this After Visit Summary.  MyChart is used to connect with patients for Virtual Visits (Telemedicine).  Patients are able to view lab/test results, encounter notes, upcoming appointments, etc.  Non-urgent messages can be sent to your provider as well.   To learn more about what you can do with MyChart, go to [email or web address].

## 2023-12-04 NOTE — Assessment & Plan Note (Signed)
 History of essential hypertension with blood pressure measured today at 148/90.  He was on losartan  which was discontinued by his PCP 6 months ago and his blood pressures much better controlled at home.

## 2023-12-05 LAB — NMR, LIPOPROFILE
Cholesterol, Total: 115 mg/dL (ref 100–199)
HDL Particle Number: 27.6 umol/L — ABNORMAL LOW (ref >=30.5)
HDL-C: 45 mg/dL (ref >39)
LDL Particle Number: 570 nmol/L (ref <1000)
LDL Size: 20.7 nm (ref >20.5)
LDL-C (NIH Calc): 53 mg/dL (ref 0–99)
LP-IR Score: 33 (ref <=45)
Small LDL Particle Number: 207 nmol/L (ref <=527)
Triglycerides: 86 mg/dL (ref 0–149)

## 2023-12-06 ENCOUNTER — Ambulatory Visit: Payer: Self-pay | Admitting: Internal Medicine

## 2024-03-03 ENCOUNTER — Encounter: Payer: Self-pay | Admitting: Radiology

## 2024-04-09 NOTE — H&P (View-Only) (Signed)
 "  [facility] Ophthalmology - [facility] Visit Note 04/09/24     CHIEF COMPLAINT Patient presents for Cataract   HISTORY OF PRESENT ILLNESS: Chad Berry is a 83 y.o. male who presents to the clinic today for:   HPI   LV: 03/18/2024 Here for Pre-Op left eye   Pt states vision is the same still c/o blurriness left eye>right eye, with glare around light and hard time seeing in dim lighting, street signs are harder to see and colors do not seem as bright. Not any worse. Ready for surgery left eye.   No pain/discomfort/redness. No floaters/flashes.  Ocular meds: none Last edited by Maryelizabeth Fisherman, COA on 04/09/2024  9:17 AM.      CURRENT MEDICATIONS: Medications Ordered Prior to Encounter[1]  Referring physician: No referring provider defined for this encounter.  ALLERGIES Allergies[2]  PAST MEDICAL HISTORY Medical History[3] Surgical History[4]  FAMILY HISTORY Family History[5]  SOCIAL HISTORY Social History[6]        OPHTHALMIC EXAM:  Base Eye Exam     Visual Acuity (Snellen - Linear)       Right Left   Dist [place] 20/50 -2 20/50 -2         Pupils       Pupils   Right PERRL   Left PERRL         Neuro/Psych     Oriented x3: Yes   Mood/Affect: Normal           Slit Lamp and Fundus Exam     External Exam       Right Left   External Normal Normal         Slit Lamp Exam       Right Left   Lids/Lashes Normal Normal   Conjunctiva/Sclera elevated Nasal Pinguecula elevated Nasal Pinguecula   Cornea 2+ PEK 2+ PEK   Anterior Chamber Deep and quiet Deep and quiet   Iris Dilated Dilates 6 mm   Lens PC IOL, Trace PCO 2+ NSC, early central PSC           Refraction     Manifest Refraction (Auto)       Sphere Cylinder Axis   Right +0.75 -1.75 096   Left +0.75 -1.25 050               IMAGING AND PROCEDURES:    ASSESSMENT/PLAN:  1. Nuclear sclerotic cataract of left eye       Cataract OS  - moderate & visually  significant with increased symptoms  - Risks, benefits, and alternatives reviewed with patient  - patient wants to proceed with surgery  - Schedule phaco/PCIOL OS  - dilates 6 mm +epi and omidria at time of surgery    - no trauma, no flomax, no ocular surgery, no claustrophobia, no tremor, no restless leg  - aim for distance, discussed need for readers up close  ............SABRALatex Allergy: No General Appearance: Normal Chest and Lungs: Equal excursion Extremities: No edema Head (non-eye): Normal Heart: Regular rate and rhythm Abdomen: Non distended Pulses: Normal Skin: Normal  Cataract OS: Plan for Cataract Extraction with IOL Placement (CEIOL) OS  PATIENT IS CLEARED FOR SURGERY IN AN AMBULATORY SETTING.  The nature, risks, benefits, and alternatives to the procedure have been explained to the patient at length, including, but not limited to, those of hemorrhage, infection, retinal detachment, glaucoma. The patient accepted the risks and wishes to proceed with surgery. Informed consent was signed.  PRE- OP   Cataract  Surgery Plan/Orders: CEIOL OS Discussed IOL options, including multifocal, toric, and monofocal with mono-vision. Patient elects to proceed with standard IOL option after full discussion of advantages and disadvantages of each. Post Operative Refraction Goal (PORG): [place]   PRE-OP ORDERS: 1) NPO per O.R. Policy 2) Operative permit: Cataract extraction LEFT eye with implant 3) Meds: Release signed and held pre-op orders. 4) Wash face with Cetaphil   POST-OP ORDERS: 1) Vital signs per anesthesia care unit policy. 2) PO fluids and diet as tolerated. 3) Discontinue IV when tolerating PO fluids and if no IV medications ordered. 4) Provide discharge instruction for routine cataract surgery. 5) See Dr. Huey at his office as scheduled. 6) Call Dr. Huey anytime PRN problems [phone].    Medication ordered this visit:  New Medications Ordered This Visit   Medications   moxifloxacin (VIGAMOX) 0.5 % ophthalmic solution    Sig: Instill 1 drop in the left eye 4 times a day. Start the drops after you arrive home from surgery.    Dispense:  3 mL    Refill:  1   prednisoLONE acetate (PRED FORTE) 1 % ophthalmic suspension    Sig: Instill 1 drop in the LEFT eye 4 times a day. Start the drops after you arrive home from surgery.    Dispense:  5 mL    Refill:  2       Return in about 13 days (around 04/22/2024) for Cataract surgery, OS.  Patient Instructions  Keep next scheduled appointment, call sooner with any concerns.   Explained the diagnoses, plan, and follow up with the patient and they expressed understanding.  Patient expressed understanding of the importance of proper follow up care.  This document serves as a record of services personally performed by Comer Jerilee Huey, MD.  It was created on their behalf by Harlene Earnie Ming, COA, a trained medical scribe, and Certified Ophthalmic Assistant (COA). During the course of documenting the history, physical exam and medical decision making, I was functioning as a stage manager. The creation of this record is the providers dictation and/or activities during the visit.  Electronically signed by Harlene Earnie Ming, COA 04/09/2024 9:31 AM   Abbreviations: M myopia (nearsighted); A astigmatism; H hyperopia (farsighted); P presbyopia; Mrx spectacle prescription;  CTL contact lenses; OD right eye; OS left eye; OU both eyes  XT exotropia; ET esotropia; PEK punctate epithelial keratitis; PEE punctate epithelial erosions; DES dry eye syndrome; MGD meibomian gland dysfunction; ATs artificial tears; PFAT's preservative free artificial tears; NSC nuclear sclerotic cataract; PSC posterior subcapsular cataract; ERM epi-retinal membrane; PVD posterior vitreous detachment; RD retinal detachment; DM diabetes mellitus; DR diabetic retinopathy; NPDR non-proliferative diabetic retinopathy; PDR  proliferative diabetic retinopathy; CSME clinically significant macular edema; DME diabetic macular edema; dbh dot blot hemorrhages; CWS cotton wool spot; POAG primary open angle glaucoma; C/D cup-to-disc ratio; HVF humphrey visual field; GVF goldmann visual field; OCT optical coherence tomography; IOP intraocular pressure; BRVO Branch retinal vein occlusion; CRVO central retinal vein occlusion; CRAO central retinal artery occlusion; BRAO branch retinal artery occlusion; RT retinal tear; SB scleral buckle; PPV pars plana vitrectomy; VH Vitreous hemorrhage; PRP panretinal laser photocoagulation; IVK intravitreal kenalog; VMT vitreomacular traction; MH Macular hole;  NVD neovascularization of the disc; NVE neovascularization elsewhere; AREDS age related eye disease study; ARMD age related macular degeneration; POAG primary open angle glaucoma; EBMD epithelial/anterior basement membrane dystrophy; ACIOL anterior chamber intraocular lens; IOL intraocular lens; PCIOL posterior chamber intraocular lens; Phaco/IOL phacoemulsification with intraocular lens placement; PRK  photorefractive keratectomy; LASIK laser assisted in situ keratomileusis; HTN hypertension; DM diabetes mellitus; COPD chronic obstructive pulmonary disease   Electronically signed by: Comer Jerilee Lauth, MD 04/09/2024 9:40 AM        [1] Current Outpatient Medications on File Prior to Visit  Medication Sig Dispense Refill   cholecalciferol (VITAMIN D3) 2,000 unit tablet Take 2,000 Units by mouth daily.     cyanocobalamin (VITAMIN B12) 100 mcg tablet Take 100 mcg by mouth daily.     ezetimibe  (ZETIA ) 10 mg tablet Take 1 tablet by mouth daily.     fluticasone  propionate (FLONASE ) 50 mcg/spray nasal spray 2 sprays into each nostril once daily or 1 spray into each nostril twice daily. 16 g 11   levothyroxine  (SYNTHROID ) 75 mcg tablet Take 1 tablet by mouth daily.     Repatha  SureClick 140 mg/mL pnij INJECT 140 MG SUBCUTANEOUSLY EVERY  14 DAYS     tamsulosin (FLOMAX) 0.4 mg cap Take 0.4 mg by mouth nightly.     [DISCONTINUED] moxifloxacin (VIGAMOX) 0.5 % ophthalmic solution Instill 1 drop in the left eye 4 times a day. Start the drops after you arrive home from surgery. 3 mL 1   [DISCONTINUED] prednisoLONE acetate (PRED FORTE) 1 % ophthalmic suspension Instill 1 drop in the LEFT eye 4 times a day. Start the drops after you arrive home from surgery. 5 mL 2   azithromycin (ZITHROMAX) 250 mg tablet Take 250 mg by mouth. (Patient not taking: Reported on 04/09/2024)     hydroCHLOROthiazide (HYDRODIURIL) 25 mg tablet Take 25 mg by mouth daily.     losartan  (COZAAR ) 25 mg tablet Take 25 mg by mouth daily.     magnesium  250 mg tablet Take 250 mg by mouth.     No current facility-administered medications on file prior to visit.  [2] Allergies Allergen Reactions   Atorvastatin  Other (See Comments)    Myalgias, joint pain   Ct Dye [Iodinated Contrast Media] Itching   Hydrochlorothiazide Other (See Comments)    palpitations   Metoprolol  Other (See Comments)    Bradycardia  [3] Past Medical History: Diagnosis Date   Colon polyp    Diverticulitis    High cholesterol    Hypertension with goal to be determined   [4] Past Surgical History: Procedure Laterality Date   CATARACT EXTRACTION W/  INTRAOCULAR LENS IMPLANT Right    Elsewhere, [place]   SINUS SURGERY    [5] Family History Problem Relation Name Age of Onset   Stroke Mother     Hypertension Father     Stroke Father     Hypertension Sister     Diabetes Paternal Uncle     Colon cancer Neg Hx     Colon polyps Neg Hx     Glaucoma Neg Hx     Macular degeneration Neg Hx    [6] Social History Tobacco Use   Smoking status: Former    Types: Cigarettes   Smokeless tobacco: Former    Types: Chew   Tobacco comments:    Smoked a few back in 1960/ Quit chew 20 yrs ago  Psychologist, Educational Use   Vaping status: Never Used  Substance Use Topics   Alcohol  use: No    Comment: Quit 20 yrs ago   Drug use: No  "

## 2024-04-30 NOTE — Progress Notes (Signed)
 "  [facility] Ophthalmology - [facility] Visit Note 04/30/24     CHIEF COMPLAINT Patient presents for Post-op Exam   HISTORY OF PRESENT ILLNESS: Chad Berry is a 83 y.o. male who presents to the clinic today for:   HPI   LV: 04/22/2024  Patient returns as scheduled for 1 week post op. S/p CE of the left eye (DOS 04/22/2024).   Vision is ok. C/o intermittent discomfort in the left eye.  Denies eye pain, redness or photophobia.  Denies flashes, floaters or dark curtain.  Wearing night-time shield.   Used this AM Prednisolone 1 drop 4 times daily the left eye.  Moxifloxacin 1 drop 4 times daily the left eye.  No drops right eye.  Last edited by Powell Minus Rattler, COA on 04/30/2024  8:12 AM.      CURRENT MEDICATIONS: Medications Ordered Prior to Encounter[1]  Referring physician: No referring provider defined for this encounter.  ALLERGIES Allergies[2]  PAST MEDICAL HISTORY Medical History[3] Surgical History[4]  FAMILY HISTORY Family History[5]  SOCIAL HISTORY Social History[6]        OPHTHALMIC EXAM:  Base Eye Exam     Visual Acuity (Snellen - Linear)       Right Left   Dist [place] 20/30 -1 20/25 +1         Tonometry ([facility], 8:17 AM)       Right Left   Pressure 10 10         Neuro/Psych     Oriented x3: Yes   Mood/Affect: Normal           Slit Lamp and Fundus Exam     External Exam       Right Left   External Normal Normal         Slit Lamp Exam       Right Left   Lids/Lashes Normal Normal   Conjunctiva/Sclera elevated Nasal Pinguecula elevated Nasal Pinguecula   Cornea 2+ PEK Clear   Anterior Chamber Deep and quiet Deep and quiet   Iris Round and reactive Round and reactive   Lens PC IOL, Trace PCO PC IOL           Refraction     Manifest Refraction       Sphere Cylinder Axis Dist [place]   Right       Left +0.50 -0.50 062 20/20         Manifest Refraction #2 (Auto)       Sphere Cylinder Axis Dist VA    Right +1.00 -2.00 104    Left +1.00 -1.00 062                IMAGING AND PROCEDURES:   ASSESSMENT/PLAN:  1. Pseudophakia, left eye        1 week s/p cataract surgery, LEFT eye, doing well  - Discontinue Moxifloxacin drops - Continue Prednisolone acetate 1%: decrease 1 drop 2 times daily until returning  - written instructions given to pt today - No longer need the shield at bedtime or when sleeping - Patient to call immediately if decreased vision, pain, redness, or new flashes or floaters  - f/u final post op: AR/MR/dilate OU  Medication ordered this visit:  No orders of the defined types were placed in this encounter.      Return in about 27 days (around 05/27/2024) for final post op, AR/MR dilate OU.  Patient Instructions  LEFT Eye:   - Discontinue Moxifloxacin drops - Continue Prednisolone acetate 1%:  1 drop 2 times daily until 05/22/23 - No longer need the shield at bedtime or when sleeping - call immediately if decreased vision, pain, redness, or new flashes or floaters  Use artificial tears- one drop in both eyes twice a day     Explained the diagnoses, plan, and follow up with the patient and they expressed understanding.  Patient expressed understanding of the importance of proper follow up care.   This document serves as a record of services personally performed by Comer Jerilee Lauth, MD.  It was created on their behalf by Harlene Earnie Ming, COA, a trained medical scribe, and Certified Ophthalmic Assistant (COA). During the course of documenting the history, physical exam and medical decision making, I was functioning as a stage manager. The creation of this record is the providers dictation and/or activities during the visit.  Electronically signed by Harlene Earnie Ming, COA 04/30/2024 8:24 AM   Abbreviations: M myopia (nearsighted); A astigmatism; H hyperopia (farsighted); P presbyopia; Mrx spectacle prescription;  CTL contact lenses; OD  right eye; OS left eye; OU both eyes  XT exotropia; ET esotropia; PEK punctate epithelial keratitis; PEE punctate epithelial erosions; DES dry eye syndrome; MGD meibomian gland dysfunction; ATs artificial tears; PFAT's preservative free artificial tears; NSC nuclear sclerotic cataract; PSC posterior subcapsular cataract; ERM epi-retinal membrane; PVD posterior vitreous detachment; RD retinal detachment; DM diabetes mellitus; DR diabetic retinopathy; NPDR non-proliferative diabetic retinopathy; PDR proliferative diabetic retinopathy; CSME clinically significant macular edema; DME diabetic macular edema; dbh dot blot hemorrhages; CWS cotton wool spot; POAG primary open angle glaucoma; C/D cup-to-disc ratio; HVF humphrey visual field; GVF goldmann visual field; OCT optical coherence tomography; IOP intraocular pressure; BRVO Branch retinal vein occlusion; CRVO central retinal vein occlusion; CRAO central retinal artery occlusion; BRAO branch retinal artery occlusion; RT retinal tear; SB scleral buckle; PPV pars plana vitrectomy; VH Vitreous hemorrhage; PRP panretinal laser photocoagulation; IVK intravitreal kenalog; VMT vitreomacular traction; MH Macular hole;  NVD neovascularization of the disc; NVE neovascularization elsewhere; AREDS age related eye disease study; ARMD age related macular degeneration; POAG primary open angle glaucoma; EBMD epithelial/anterior basement membrane dystrophy; ACIOL anterior chamber intraocular lens; IOL intraocular lens; PCIOL posterior chamber intraocular lens; Phaco/IOL phacoemulsification with intraocular lens placement; PRK photorefractive keratectomy; LASIK laser assisted in situ keratomileusis; HTN hypertension; DM diabetes mellitus; COPD chronic obstructive pulmonary disease   Electronically signed by: Comer Jerilee Lauth, MD 04/30/2024 8:37 AM        [1] Current Outpatient Medications on File Prior to Visit  Medication Sig Dispense Refill   azithromycin  (ZITHROMAX) 250 mg tablet Take 250 mg by mouth.     cholecalciferol (VITAMIN D3) 2,000 unit tablet Take 2,000 Units by mouth daily.     cyanocobalamin (VITAMIN B12) 100 mcg tablet Take 100 mcg by mouth daily.     ezetimibe  (ZETIA ) 10 mg tablet Take 1 tablet by mouth daily.     fluticasone  propionate (FLONASE ) 50 mcg/spray nasal spray 2 sprays into each nostril once daily or 1 spray into each nostril twice daily. 16 g 11   hydroCHLOROthiazide (HYDRODIURIL) 25 mg tablet Take 25 mg by mouth daily.     levothyroxine  (SYNTHROID ) 75 mcg tablet Take 1 tablet by mouth daily.     losartan  (COZAAR ) 25 mg tablet Take 25 mg by mouth daily.     magnesium  250 mg tablet Take 250 mg by mouth.     prednisoLONE acetate (PRED FORTE) 1 % ophthalmic suspension Instill 1 drop in the LEFT eye 4 times  a day. Start the drops after you arrive home from surgery. 5 mL 2   Repatha  SureClick 140 mg/mL pnij INJECT 140 MG SUBCUTANEOUSLY EVERY 14 DAYS     tamsulosin (FLOMAX) 0.4 mg cap Take 0.4 mg by mouth nightly.     [DISCONTINUED] moxifloxacin (VIGAMOX) 0.5 % ophthalmic solution Instill 1 drop in the left eye 4 times a day. Start the drops after you arrive home from surgery. 3 mL 1   No current facility-administered medications on file prior to visit.  [2] Allergies Allergen Reactions   Atorvastatin  Other (See Comments)    Myalgias, joint pain   Ct Dye [Iodinated Contrast Media] Itching   Hydrochlorothiazide Other (See Comments)    palpitations   Metoprolol  Other (See Comments)    Bradycardia  [3] Past Medical History: Diagnosis Date   Colon polyp    Disease of thyroid gland    Diverticulitis    Heart valve disease    High cholesterol    Hypertension with goal to be determined    Shortness of breath   [4] Past Surgical History: Procedure Laterality Date   CATARACT EXTRACTION W/  INTRAOCULAR LENS IMPLANT Right    Elsewhere, [place]   CATARACT EXTRACTION W/  INTRAOCULAR LENS IMPLANT Left  04/22/2024   Dr Huey [place], SN # [id number], +23.5 D   CATARACT EXTRACTION W/ INTRAOCULAR LENS IMPLANT Left 04/22/2024   EXTRACTION CATARACT LEFT EYE WITH INTRAOCULAR LENS INSERTION performed by Comer Jerilee Huey, MD at [facility] OR   SINUS SURGERY    [5] Family History Problem Relation Name Age of Onset   Stroke Mother     Hypertension Father     Stroke Father     Hypertension Sister     Diabetes Paternal Uncle     Colon cancer Neg Hx     Colon polyps Neg Hx     Glaucoma Neg Hx     Macular degeneration Neg Hx    [6] Social History Tobacco Use   Smoking status: Former    Types: Cigarettes   Smokeless tobacco: Former    Types: Chew   Tobacco comments:    Smoked a few back in 1960/ Quit chew 20 yrs ago  Psychologist, Educational Use   Vaping status: Never Used  Substance Use Topics   Alcohol use: No    Comment: Quit 20 yrs ago   Drug use: No  "

## 2024-04-30 NOTE — Patient Instructions (Signed)
 LEFT Eye:   - Discontinue Moxifloxacin drops - Continue Prednisolone acetate 1%: 1 drop 2 times daily until 05/22/23 - No longer need the shield at bedtime or when sleeping - call immediately if decreased vision, pain, redness, or new flashes or floaters  Use artificial tears- one drop in both eyes twice a day

## 2024-05-02 NOTE — Telephone Encounter (Signed)
 Spoke w/pt stating this morning off to the side out of left eye (LPKE/PC 04/22/24) noticed what looked like a flash of light & a black dot that floated by, but doesn't see nothing now & just wanted to let us  know  -Pt states no eye pain, no photophobia & no changes in vision-states is good  -Let pt know that sometimes after cataract surgery pt can see flash of light off to the side & floaters that comes & goes  -Gave pt know RD precautions & stressed to pt if any eye pain, changes in vision or increase in floaters or flashes let us  know ASAP  -Pt expressed understanding

## 2024-05-08 ENCOUNTER — Other Ambulatory Visit: Payer: Self-pay | Admitting: Cardiovascular Disease
